# Patient Record
Sex: Female | Born: 1935 | Race: White | Hispanic: No | Smoking: Never smoker
Health system: Southern US, Community
[De-identification: ages and names within clinical notes are randomized; demographics above are authoritative.]

## PROBLEM LIST (undated history)

## (undated) DIAGNOSIS — Z87440 Personal history of urinary (tract) infections: Secondary | ICD-10-CM

## (undated) DIAGNOSIS — K5792 Diverticulitis of intestine, part unspecified, without perforation or abscess without bleeding: Secondary | ICD-10-CM

## (undated) DIAGNOSIS — M8580 Other specified disorders of bone density and structure, unspecified site: Secondary | ICD-10-CM

## (undated) DIAGNOSIS — R32 Unspecified urinary incontinence: Secondary | ICD-10-CM

## (undated) DIAGNOSIS — G629 Polyneuropathy, unspecified: Secondary | ICD-10-CM

## (undated) DIAGNOSIS — H409 Unspecified glaucoma: Secondary | ICD-10-CM

## (undated) DIAGNOSIS — K8020 Calculus of gallbladder without cholecystitis without obstruction: Secondary | ICD-10-CM

## (undated) DIAGNOSIS — E785 Hyperlipidemia, unspecified: Secondary | ICD-10-CM

## (undated) DIAGNOSIS — G4730 Sleep apnea, unspecified: Secondary | ICD-10-CM

## (undated) DIAGNOSIS — K449 Diaphragmatic hernia without obstruction or gangrene: Secondary | ICD-10-CM

## (undated) DIAGNOSIS — K219 Gastro-esophageal reflux disease without esophagitis: Secondary | ICD-10-CM

## (undated) DIAGNOSIS — K5790 Diverticulosis of intestine, part unspecified, without perforation or abscess without bleeding: Secondary | ICD-10-CM

## (undated) DIAGNOSIS — I700 Atherosclerosis of aorta: Secondary | ICD-10-CM

## (undated) DIAGNOSIS — M1990 Unspecified osteoarthritis, unspecified site: Secondary | ICD-10-CM

## (undated) DIAGNOSIS — B019 Varicella without complication: Secondary | ICD-10-CM

## (undated) HISTORY — DX: Calculus of gallbladder without cholecystitis without obstruction: K80.20

## (undated) HISTORY — DX: Unspecified glaucoma: H40.9

## (undated) HISTORY — DX: Gastro-esophageal reflux disease without esophagitis: K21.9

## (undated) HISTORY — PX: SIGMOIDOSCOPY: SUR1295

## (undated) HISTORY — DX: Diverticulitis of intestine, part unspecified, without perforation or abscess without bleeding: K57.92

## (undated) HISTORY — PX: APPENDECTOMY: SHX54

## (undated) HISTORY — DX: Other specified disorders of bone density and structure, unspecified site: M85.80

## (undated) HISTORY — PX: BREAST BIOPSY: SHX20

## (undated) HISTORY — DX: Atherosclerosis of aorta: I70.0

## (undated) HISTORY — DX: Varicella without complication: B01.9

## (undated) HISTORY — DX: Personal history of urinary (tract) infections: Z87.440

## (undated) HISTORY — PX: TONSILLECTOMY AND ADENOIDECTOMY: SHX28

## (undated) HISTORY — DX: Unspecified osteoarthritis, unspecified site: M19.90

## (undated) HISTORY — DX: Polyneuropathy, unspecified: G62.9

## (undated) HISTORY — DX: Diaphragmatic hernia without obstruction or gangrene: K44.9

## (undated) HISTORY — PX: LAPAROSCOPIC OOPHERECTOMY: SHX6507

## (undated) HISTORY — DX: Hyperlipidemia, unspecified: E78.5

## (undated) HISTORY — DX: Unspecified urinary incontinence: R32

## (undated) HISTORY — DX: Sleep apnea, unspecified: G47.30

## (undated) HISTORY — DX: Diverticulosis of intestine, part unspecified, without perforation or abscess without bleeding: K57.90

---

## 1965-12-05 HISTORY — PX: HEMORRHOID SURGERY: SHX153

## 1966-12-05 HISTORY — PX: RECTOCELE REPAIR: SHX761

## 1970-12-05 HISTORY — PX: DILATION AND CURETTAGE OF UTERUS: SHX78

## 1973-12-05 HISTORY — PX: BUNIONECTOMY: SHX129

## 1975-12-06 HISTORY — PX: ABDOMINAL HYSTERECTOMY: SHX81

## 1983-12-06 HISTORY — PX: ANAL FISTULECTOMY: SHX1139

## 1991-12-06 HISTORY — PX: VENTRAL HERNIA REPAIR: SHX424

## 1996-12-05 HISTORY — PX: LASIK: SHX215

## 2001-12-05 HISTORY — PX: FOOT NEUROMA SURGERY: SHX646

## 2001-12-05 HISTORY — PX: NEUROMA SURGERY: SHX722

## 2003-12-06 HISTORY — PX: HAMMER TOE SURGERY: SHX385

## 2007-12-06 HISTORY — PX: CATARACT EXTRACTION, BILATERAL: SHX1313

## 2008-01-06 HISTORY — PX: REPLACEMENT TOTAL KNEE: SUR1224

## 2008-08-05 HISTORY — PX: REPLACEMENT TOTAL KNEE: SUR1224

## 2008-12-05 DIAGNOSIS — H269 Unspecified cataract: Secondary | ICD-10-CM

## 2008-12-05 HISTORY — DX: Unspecified cataract: H26.9

## 2008-12-05 HISTORY — PX: KNEE ARTHROSCOPY: SUR90

## 2009-12-05 HISTORY — PX: OTHER SURGICAL HISTORY: SHX169

## 2012-07-14 HISTORY — PX: OTHER SURGICAL HISTORY: SHX169

## 2012-12-05 HISTORY — PX: ANKLE FRACTURE SURGERY: SHX122

## 2012-12-05 HISTORY — PX: HIP SURGERY: SHX245

## 2012-12-05 HISTORY — PX: OTHER SURGICAL HISTORY: SHX169

## 2013-10-09 HISTORY — PX: OTHER SURGICAL HISTORY: SHX169

## 2015-05-06 DIAGNOSIS — N63 Unspecified lump in unspecified breast: Secondary | ICD-10-CM | POA: Insufficient documentation

## 2016-03-08 ENCOUNTER — Encounter: Payer: Self-pay | Admitting: Gastroenterology

## 2016-06-14 DIAGNOSIS — R928 Other abnormal and inconclusive findings on diagnostic imaging of breast: Secondary | ICD-10-CM | POA: Insufficient documentation

## 2016-08-17 ENCOUNTER — Encounter: Payer: Self-pay | Admitting: Family Medicine

## 2016-08-17 DIAGNOSIS — M5010 Cervical disc disorder with radiculopathy, unspecified cervical region: Secondary | ICD-10-CM | POA: Insufficient documentation

## 2016-08-17 DIAGNOSIS — G5601 Carpal tunnel syndrome, right upper limb: Secondary | ICD-10-CM | POA: Insufficient documentation

## 2016-08-18 ENCOUNTER — Ambulatory Visit (INDEPENDENT_AMBULATORY_CARE_PROVIDER_SITE_OTHER): Payer: BLUE CROSS/BLUE SHIELD | Admitting: Family Medicine

## 2016-08-18 ENCOUNTER — Encounter: Payer: Self-pay | Admitting: Family Medicine

## 2016-08-18 VITALS — BP 128/90 | HR 71 | Resp 12 | Ht 64.5 in | Wt 150.1 lb

## 2016-08-18 DIAGNOSIS — Z0000 Encounter for general adult medical examination without abnormal findings: Secondary | ICD-10-CM

## 2016-08-18 DIAGNOSIS — L821 Other seborrheic keratosis: Secondary | ICD-10-CM

## 2016-08-18 DIAGNOSIS — M159 Polyosteoarthritis, unspecified: Secondary | ICD-10-CM | POA: Insufficient documentation

## 2016-08-18 DIAGNOSIS — M792 Neuralgia and neuritis, unspecified: Secondary | ICD-10-CM | POA: Insufficient documentation

## 2016-08-18 DIAGNOSIS — G629 Polyneuropathy, unspecified: Secondary | ICD-10-CM

## 2016-08-18 DIAGNOSIS — Z79899 Other long term (current) drug therapy: Secondary | ICD-10-CM | POA: Diagnosis not present

## 2016-08-18 DIAGNOSIS — E7800 Pure hypercholesterolemia, unspecified: Secondary | ICD-10-CM

## 2016-08-18 DIAGNOSIS — N3281 Overactive bladder: Secondary | ICD-10-CM

## 2016-08-18 DIAGNOSIS — E782 Mixed hyperlipidemia: Secondary | ICD-10-CM | POA: Insufficient documentation

## 2016-08-18 DIAGNOSIS — H409 Unspecified glaucoma: Secondary | ICD-10-CM | POA: Insufficient documentation

## 2016-08-18 DIAGNOSIS — Z23 Encounter for immunization: Secondary | ICD-10-CM | POA: Diagnosis not present

## 2016-08-18 LAB — BASIC METABOLIC PANEL
BUN: 17 mg/dL (ref 6–23)
CO2: 30 mEq/L (ref 19–32)
CREATININE: 0.82 mg/dL (ref 0.40–1.20)
Calcium: 9.1 mg/dL (ref 8.4–10.5)
Chloride: 105 mEq/L (ref 96–112)
GFR: 71.45 mL/min (ref >60.00)
Glucose, Bld: 82 mg/dL (ref 70–99)
Potassium: 4.3 mEq/L (ref 3.5–5.1)
Sodium: 141 mEq/L (ref 135–145)

## 2016-08-18 LAB — LIPID PANEL
CHOLESTEROL: 242 mg/dL — AB (ref 0–200)
HDL: 67.2 mg/dL (ref >39.00)
LDL Cholesterol: 136 mg/dL — ABNORMAL HIGH (ref 0–99)
NonHDL: 174.47
Total CHOL/HDL Ratio: 4
Triglycerides: 190 mg/dL — ABNORMAL HIGH (ref 0.0–149.0)
VLDL: 38 mg/dL (ref 0.0–40.0)

## 2016-08-18 MED ORDER — CELECOXIB 200 MG PO CAPS: 200.0000 mg | ORAL_CAPSULE | Freq: Every day | ORAL | 1 refills | Status: DC

## 2016-08-18 NOTE — Patient Instructions (Signed)
A few things to remember from today's visit:   Routine general medical examination at a health care facility  Pure hypercholesterolemia  Long-term use of high-risk medication  Generalized osteoarthritis of multiple sites - Plan: celecoxib (CELEBREX) 200 MG capsule  Peripheral neuropathic pain (HCC)  Overactive bladder - Plan: Ambulatory referral to Urology  Seborrheic keratoses - Plan: Ambulatory referral to Dermatology  Glaucoma - Plan: Ambulatory referral to Ophthalmology  Foot doctor referral usually is not needed, please let me know if you do. NSAID's as Celebrex increases the risk of cardiovascular disease as well as renal problems. Risk versus benefit evaluation suggest Anything from medication, we need to continue following regularly.  Referral to other providers were placed as requested.   A few tips:  -As we age balance is not as good as it was, so there is a higher risks for falls. Please remove small rugs and furniture that is "in your way" and could increase the risk of falls. Stretching exercises may help with fall prevention: Yoga and Tai Chi are some examples. Low impact exercise is better, so you are not very achy the next day.  -Sun screen and avoidance of direct sun light recommended. Caution with dehydration, if working outdoors be sure to drink enough fluids.  - Some medications are not safe as we age, increases the risk of side effects and can potentially interact with other medication you are also taken;  including some of over the counter medications. Be sure to let me know when you start a new medication even if it is a dietary/vitamin supplement.   -Healthy diet low in red meet/animal fat and sugar + regular physical activity is recommended.      Please be sure medication list is accurate. If a new problem present, please set up appointment sooner than planned today.
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
## 2016-08-23 ENCOUNTER — Telehealth: Payer: Self-pay | Admitting: Family Medicine

## 2016-08-23 DIAGNOSIS — M159 Polyosteoarthritis, unspecified: Secondary | ICD-10-CM

## 2016-08-23 MED ORDER — CELECOXIB 200 MG PO CAPS: 200.0000 mg | ORAL_CAPSULE | Freq: Every day | ORAL | 1 refills | Status: DC

## 2016-08-23 NOTE — Telephone Encounter (Signed)
Rx sent 

## 2016-08-23 NOTE — Telephone Encounter (Signed)
Pt needs new rx name brand only celebrex 200 mg #90 with [facility]ills [facility]

## 2017-01-02 ENCOUNTER — Encounter: Payer: Self-pay | Admitting: Family Medicine

## 2017-01-02 ENCOUNTER — Ambulatory Visit (INDEPENDENT_AMBULATORY_CARE_PROVIDER_SITE_OTHER): Payer: BLUE CROSS/BLUE SHIELD | Admitting: Family Medicine

## 2017-01-02 VITALS — BP 110/80 | HR 82 | Temp 98.2°F | Resp 12 | Ht 64.5 in | Wt 149.0 lb

## 2017-01-02 DIAGNOSIS — R05 Cough: Secondary | ICD-10-CM | POA: Diagnosis not present

## 2017-01-02 DIAGNOSIS — R059 Cough, unspecified: Secondary | ICD-10-CM

## 2017-01-02 DIAGNOSIS — J189 Pneumonia, unspecified organism: Secondary | ICD-10-CM

## 2017-01-02 DIAGNOSIS — R110 Nausea: Secondary | ICD-10-CM | POA: Diagnosis not present

## 2017-01-02 MED ORDER — DOXYCYCLINE HYCLATE 100 MG PO TABS: 100.0000 mg | ORAL_TABLET | Freq: Two times a day (BID) | ORAL | 0 refills | Status: AC

## 2017-01-02 NOTE — Progress Notes (Signed)
HPI:  ACUTE VISIT  Chief Complaint  Patient presents with  . flu like symptoms    Hailey Perkins is a 82 y.o.female here today with her daughter complaining of 2-3 days of respiratory symptoms.  Productive cough with some sputum, yellowish, denies hemoptysis. Mild nasal congestion, rhinorrhea, and post nasal drainage. She has not noted fever or chills. + Fatigue and body aches. She has not noted chest pain, dyspnea, or wheezing.  A weeks ago she also felt sick ,mainly nausea and "not feeling well." No respiratory symptoms then, no vomiting, diarrhea,or abdominal pain. Nausea resolved.  No Hx of recent overseas travel. She was in [place] last week. No sick contact. No known insect bite.  Hx of allergies: No  OTC medications for this problem: Nyquil and Dyquil   Symptoms getting worse.   Review of Systems  Constitutional: Positive for appetite change and fatigue. Negative for diaphoresis and fever.  HENT: Positive for congestion and postnasal drip. Negative for ear pain, mouth sores, sinus pressure, sore throat, trouble swallowing and voice change.   Respiratory: Positive for cough. Negative for shortness of breath and wheezing.   Cardiovascular: Negative for chest pain.  Gastrointestinal: Negative for abdominal pain, diarrhea and vomiting.  Genitourinary: Negative for decreased urine volume and dysuria.  Musculoskeletal: Positive for myalgias. Negative for gait problem.  Skin: Negative for rash.  Neurological: Negative for syncope, weakness and headaches.  Hematological: Negative for adenopathy. Does not bruise/bleed easily.  Psychiatric/Behavioral: Negative for confusion. The patient is not nervous/anxious.       Current Outpatient Prescriptions on File Prior to Visit  Medication Sig Dispense Refill  . Ascorbic Acid (VITAMIN C) 1000 MG tablet Take 1,000 mg by mouth daily.    . Biotin 16109 MCG TABS Take 1 tablet by mouth daily.    . celecoxib (CELEBREX)  200 MG capsule Take 1 capsule (200 mg total) by mouth daily. 90 capsule 1  . Cholecalciferol (VITAMIN D3) 2000 units TABS Take 1 tablet by mouth daily.    . dorzolamide (TRUSOPT) 2 % ophthalmic solution 1 drop 2 (two) times daily.    . DULoxetine (CYMBALTA) 20 MG capsule Take 20 mg by mouth daily.    Marland Kitchen Lifitegrast (XIIDRA) 5 % SOLN Apply 2 drops to eye daily.    . Magnesium 250 MG TABS Take 1 tablet by mouth daily.    . psyllium (METAMUCIL) 58.6 % packet Take 1 packet by mouth daily.    . solifenacin (VESICARE) 10 MG tablet Take 10 mg by mouth daily.     No current facility-administered medications on file prior to visit.      Past Medical History:  Diagnosis Date  . Arthritis   . Chicken pox   . GERD (gastroesophageal reflux disease)   . Glaucoma   . History of frequent urinary tract infections   . Hyperlipidemia   . Urine incontinence    Allergies  Allergen Reactions  . Protonix [Pantoprazole Sodium]     Social History   Social History  . Marital status: Widowed    Spouse name: N/A  . Number of children: N/A  . Years of education: N/A   Social History Main Topics  . Smoking status: Never Smoker  . Smokeless tobacco: Never Used  . Alcohol use No  . Drug use: No  . Sexual activity: No   Other Topics Concern  . None   Social History Narrative  . None    Vitals:   01/02/17  1603  BP: 110/80  Pulse: 82  Resp: 12  Temp: 98.2 F (36.8 C)   O2 sat 96% at RA.  Body mass index is 25.18 kg/m.   Physical Exam  Nursing note and vitals reviewed. Constitutional: She is oriented to person, place, and time. She appears well-developed and well-nourished. She does not appear ill. No distress.  HENT:  Head: Atraumatic.  Nose: Rhinorrhea present.  Mouth/Throat: Uvula is midline, oropharynx is clear and moist and mucous membranes are normal.  Eyes: Conjunctivae are normal.  Cardiovascular: Normal rate and regular rhythm.   No murmur heard. Respiratory: Effort  normal. No stridor. No respiratory distress. She has rales (fine rales rigth base).  Lymphadenopathy:    She has no cervical adenopathy.  Neurological: She is alert and oriented to person, place, and time. She has normal strength.  Skin: Skin is warm. No rash noted. No erythema.  Psychiatric: She has a normal mood and affect. Her speech is normal.  Well groomed, good eye contact.      ASSESSMENT AND PLAN:     Hailey Perkins was seen today for flu like symptoms.  Diagnoses and all orders for this visit:    Cough -     DG Chest 2 View; Future  Pneumonia, unspecified organism  Today rapid flu was negative. Found on auscultation fine rales right base, so I will treat as CAP with Doxycycline. CBC and CXR were ordered. Plain Mucinex OTC and adequate fluid intake + rest recommended.  She was instructed about warning signs. F/U in 10 days if needed.   -     CBC w/Diff  Nausea without vomiting  Resolved. It seemed like an isolated issue, at this time I do not think it is related to respiratory symptoms. Adequate hydration. F/U as needed.  Other orders -     doxycycline (VIBRA-TABS) 100 MG tablet; Take 1 tablet (100 mg total) by mouth 2 (two) times daily.      -Hailey Perkins was advised to return or notify a doctor immediately if symptoms worsen or persist or new concerns arise, she voices understanding.       Laurisa Sahakian G. [place], MD  [facility]. [place] office.

## 2017-01-02 NOTE — Progress Notes (Signed)
Pre visit review using our clinic review tool, if applicable. No additional management support is needed unless otherwise documented below in the visit note. 

## 2017-01-02 NOTE — Patient Instructions (Addendum)
A few things to remember from today's visit:   Pneumonia, unspecified organism - Plan: CBC w/Diff  Cough - Plan: DG Chest 2 View  Monitor for sudden worsening symptoms, shortness of breath, chest pain.  Please be sure medication list is accurate. If a new problem present, please set up appointment sooner than planned today.

## 2017-01-03 ENCOUNTER — Ambulatory Visit (INDEPENDENT_AMBULATORY_CARE_PROVIDER_SITE_OTHER)
Admission: RE | Admit: 2017-01-03 | Discharge: 2017-01-03 | Disposition: A | Discharge status: Home / Self Care | Payer: BLUE CROSS/BLUE SHIELD | Source: Ambulatory Visit | Attending: Family Medicine | Admitting: Family Medicine

## 2017-01-03 DIAGNOSIS — R059 Cough, unspecified: Secondary | ICD-10-CM

## 2017-01-03 DIAGNOSIS — R05 Cough: Secondary | ICD-10-CM | POA: Diagnosis not present

## 2017-01-03 LAB — CBC WITH DIFFERENTIAL/PLATELET
BASOS ABS: 0 10*3/uL (ref 0.0–0.1)
Basophils Relative: 0.6 % (ref 0.0–3.0)
EOS ABS: 0.1 10*3/uL (ref 0.0–0.7)
EOS PCT: 2.5 % (ref 0.0–5.0)
HCT: 38.5 % (ref 36.0–46.0)
HEMOGLOBIN: 13 g/dL (ref 12.0–15.0)
Lymphocytes Relative: 21.6 % (ref 12.0–46.0)
Lymphs Abs: 1.1 10*3/uL (ref 0.7–4.0)
MCHC: 33.6 g/dL (ref 30.0–36.0)
MCV: 91.9 fl (ref 78.0–100.0)
MONO ABS: 0.6 10*3/uL (ref 0.1–1.0)
Monocytes Relative: 12.8 % — ABNORMAL HIGH (ref 3.0–12.0)
Neutro Abs: 3.2 10*3/uL (ref 1.4–7.7)
Neutrophils Relative %: 62.5 % (ref 43.0–77.0)
Platelets: 170 10*3/uL (ref 150.0–400.0)
RBC: 4.19 Mil/uL (ref 3.87–5.11)
RDW: 13.2 % (ref 11.5–15.5)
WBC: 5 10*3/uL (ref 4.0–10.5)

## 2017-01-03 IMAGING — DX DG CHEST 2V
2 series · 2 of 2 positions shown · non-contrast
Comparison: None.

CLINICAL DATA: Productive cough.

EXAM:
CHEST  2 VIEW

[chest pa]
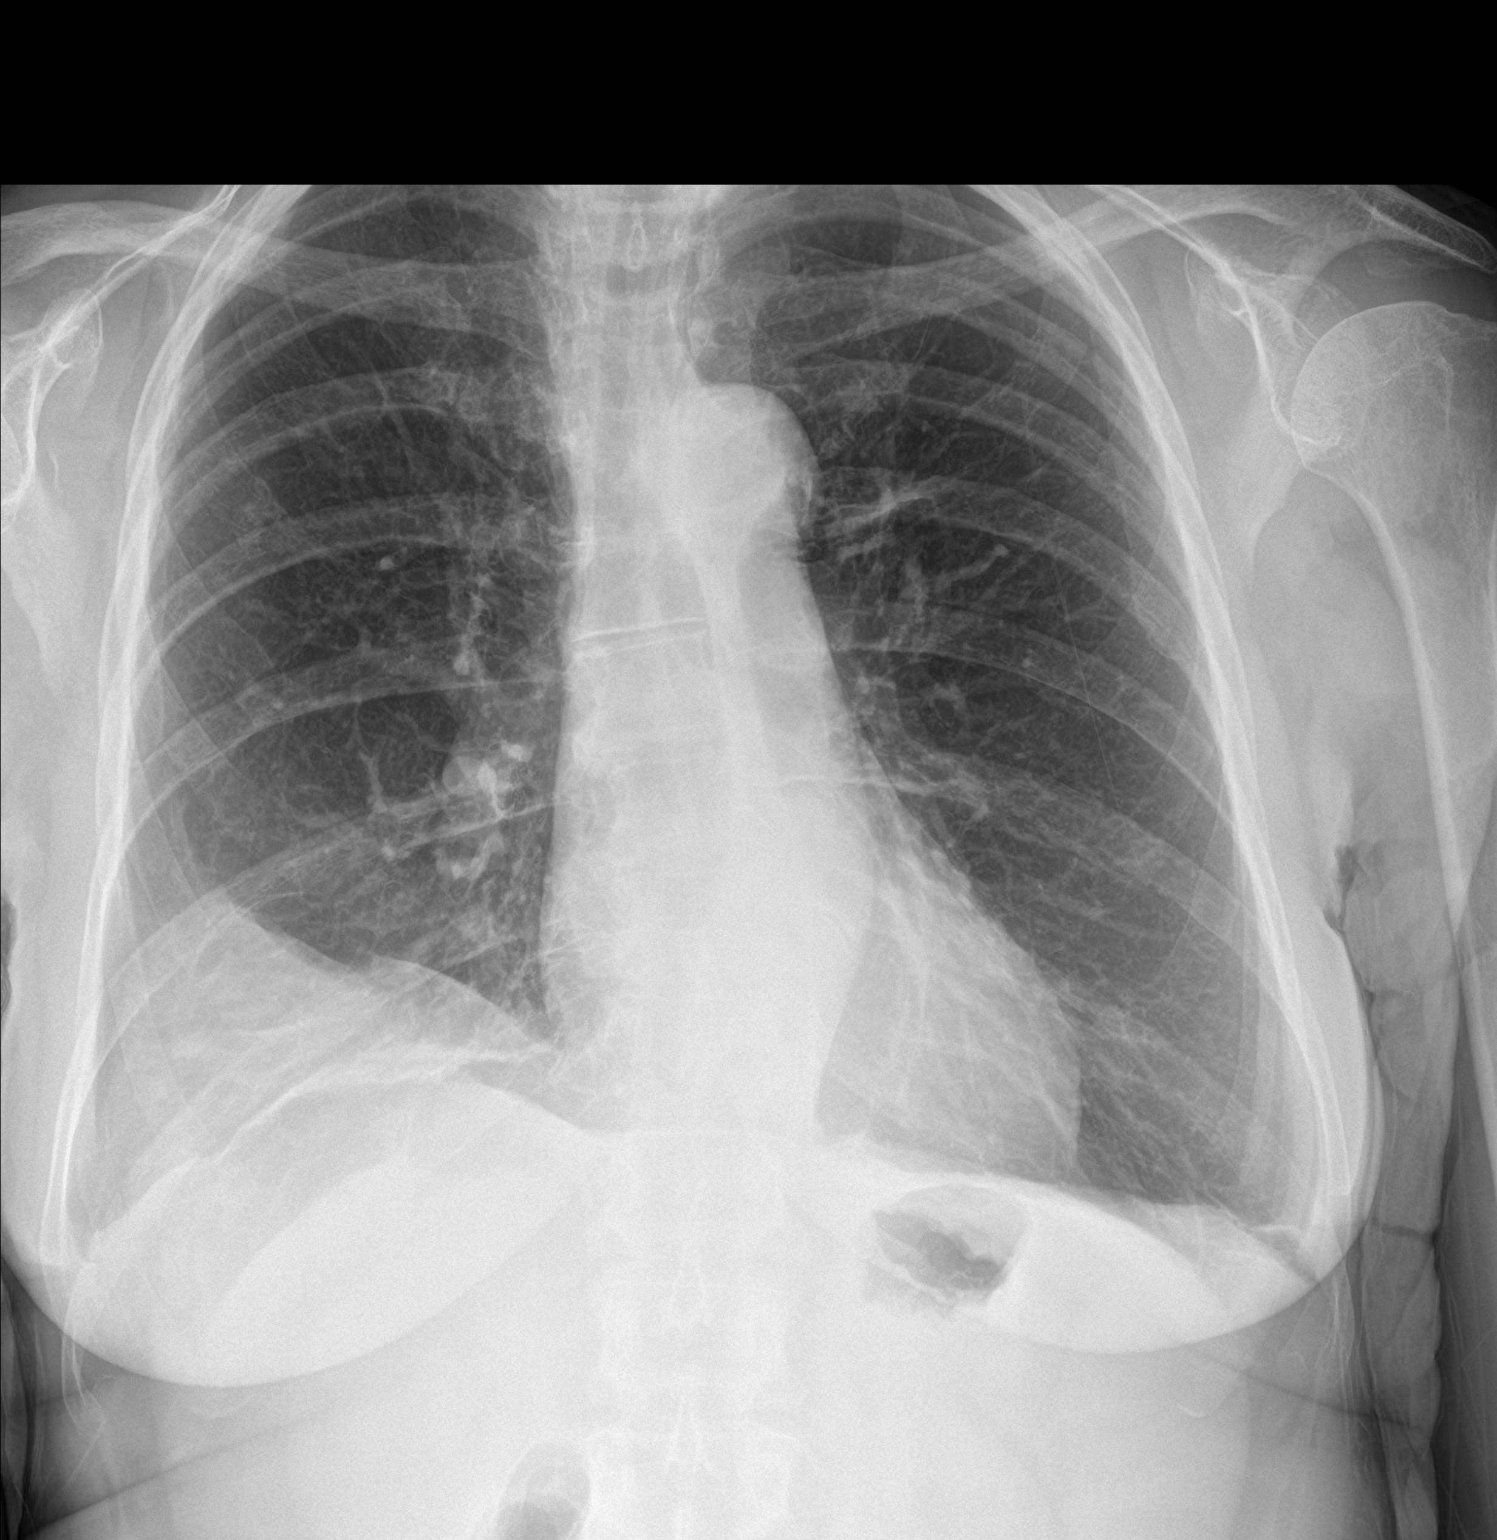

[chest lat]
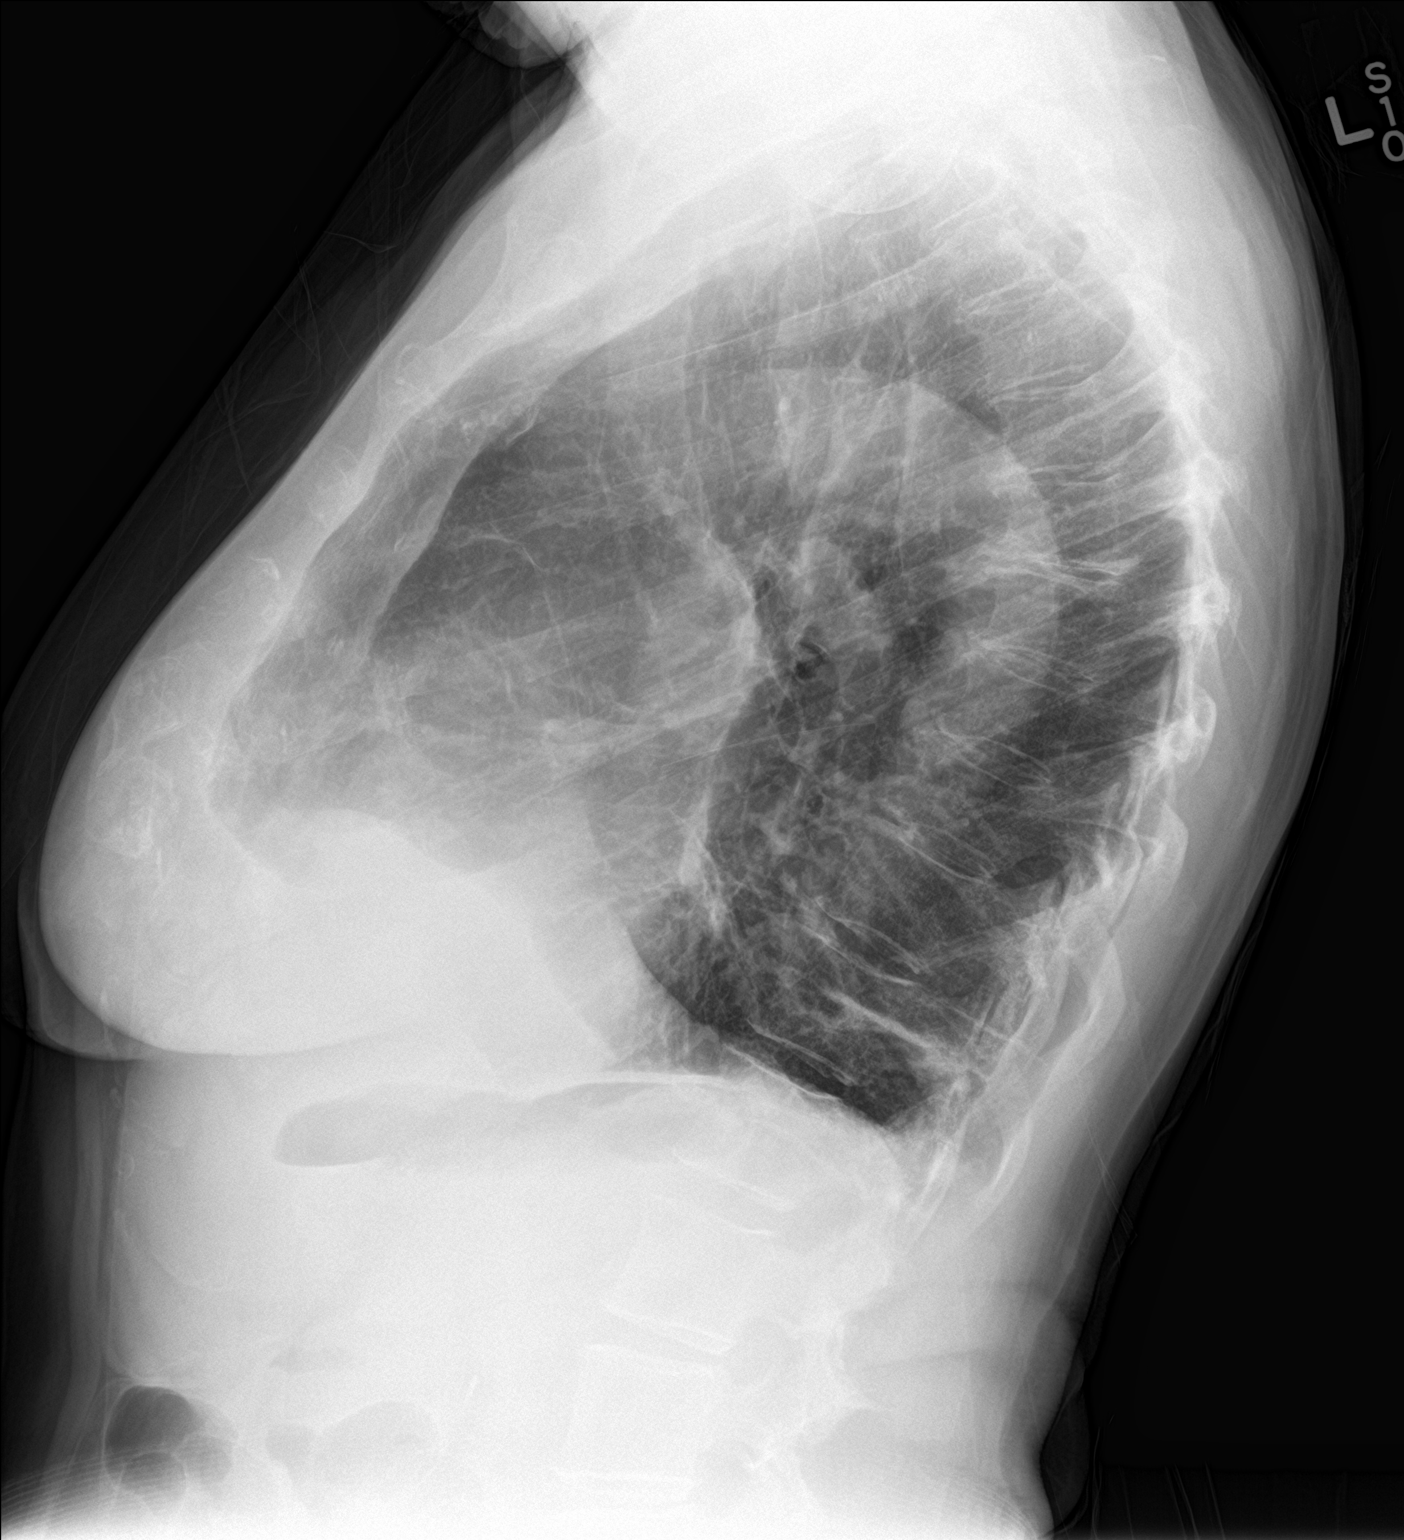

[2 of 2 positions shown; findings below may reference images not displayed]

FINDINGS: The heart size and mediastinal contours are within normal limits.
Atherosclerosis of thoracic aorta is noted. No pneumothorax is
noted. Minimal left basilar subsegmental atelectasis or scarring is
noted. Right basilar opacity is noted concerning for atelectasis or
possibly infiltrate. The visualized skeletal structures are
unremarkable.
IMPRESSION: Right basilar opacity concerning for atelectasis or infiltrate.
Followup PA and lateral chest X-ray is recommended in 3-4 weeks
following trial of antibiotic therapy to ensure resolution and
exclude underlying malignancy.

## 2017-01-04 ENCOUNTER — Telehealth: Payer: Self-pay | Admitting: Family Medicine

## 2017-01-04 NOTE — Telephone Encounter (Signed)
Pt is calling sara back and requesting xray result

## 2017-01-04 NOTE — Telephone Encounter (Signed)
Informed patient of results and patient verbalized understanding.  

## 2017-01-23 ENCOUNTER — Ambulatory Visit (INDEPENDENT_AMBULATORY_CARE_PROVIDER_SITE_OTHER): Payer: BLUE CROSS/BLUE SHIELD | Admitting: Family Medicine

## 2017-01-23 ENCOUNTER — Encounter: Payer: Self-pay | Admitting: Family Medicine

## 2017-01-23 VITALS — BP 110/80 | HR 72 | Resp 12 | Ht 64.5 in | Wt 146.2 lb

## 2017-01-23 DIAGNOSIS — M159 Polyosteoarthritis, unspecified: Secondary | ICD-10-CM | POA: Diagnosis not present

## 2017-01-23 DIAGNOSIS — M792 Neuralgia and neuritis, unspecified: Secondary | ICD-10-CM | POA: Diagnosis not present

## 2017-01-23 DIAGNOSIS — R938 Abnormal findings on diagnostic imaging of other specified body structures: Secondary | ICD-10-CM

## 2017-01-23 DIAGNOSIS — R9389 Abnormal findings on diagnostic imaging of other specified body structures: Secondary | ICD-10-CM

## 2017-01-23 NOTE — Patient Instructions (Signed)
A few things to remember from today's visit:   Generalized osteoarthritis of multiple sites  Peripheral neuropathic pain  Abnormal chest x-ray - Plan: DG Chest 2 View    [name], today we have followed on some of your chronic medical problems and they seem to be stable, so no changes in current management today.  Review medication list and be sure it is accurate.  -Remember a healthy diet and regular physical activity are very important for prevention as well as for well being; they also help with many chronic problems, decreasing the need of adding new medications and delaying or preventing possible complications.  I will see you back in 6-7 months.  CXR in 1-2 weeks.  Remember to arrange your follow up appt before leaving today.  Please follow sooner than planned if a new concern arises.

## 2017-01-23 NOTE — Progress Notes (Signed)
Pre visit review using our clinic review tool, if applicable. No additional management support is needed unless otherwise documented below in the visit note. 

## 2017-01-23 NOTE — Progress Notes (Signed)
HPI:   [name] is a 82 y.o. female, who is here today to follow on some chronic medical problems. I last saw her 01/02/17 for cough.  CXR 01/03/17 showed right basilar opacities concerning for atelectasis or infiltrates. 3-4 weeks f/u was recommended. She has not noted fever,chills, chest pain, dyspnea, or wheezing. Cough is occasional and non productive.    Hx of Generalized OA: Arthralgia and lower back pain. Tolerating Celebrex well and still heping with joint pain and back pain. She has followed with rheumatologists. No limitation in her daily activities, while taking medication pain is mild.  S/P back surgery and right TKR.  Pain usually exacerbated by prolonged walking and standing. + Stiffness. Denies saddle anesthesia or changes in bowel/urine continence.   Peripheral neuropathy: Unknown etiology, she has followed with neurologists. She is on Cymbalta 20 mg, which has helped with symptoms. No side effects reported.   Lab Results  Component Value Date   CREATININE 0.82 08/18/2016   BUN 17 08/18/2016   NA 141 08/18/2016   K 4.3 08/18/2016   CL 105 08/18/2016   CO2 30 08/18/2016     No new concerns today.   Review of Systems  Constitutional: Negative for activity change, appetite change, fatigue and fever.  HENT: Negative for mouth sores, nosebleeds and trouble swallowing.   Respiratory: Positive for cough. Negative for shortness of breath and wheezing.   Cardiovascular: Negative for chest pain, palpitations and leg swelling.  Gastrointestinal: Negative for abdominal pain, nausea and vomiting.       Negative for changes in bowel habits.  Genitourinary: Negative for decreased urine volume and hematuria.  Musculoskeletal: Positive for arthralgias and back pain. Negative for gait problem and joint swelling.  Neurological: Negative for syncope, weakness and headaches.  Psychiatric/Behavioral: Negative for confusion.      Current Outpatient  Prescriptions on File Prior to Visit  Medication Sig Dispense Refill  . Ascorbic Acid (VITAMIN C) 1000 MG tablet Take 1,000 mg by mouth daily.    . Biotin 9604510000 MCG TABS Take 1 tablet by mouth daily.    . Cholecalciferol (VITAMIN D3) 2000 units TABS Take 1 tablet by mouth daily.    . dorzolamide (TRUSOPT) 2 % ophthalmic solution 1 drop 2 (two) times daily.    . DULoxetine (CYMBALTA) 20 MG capsule Take 20 mg by mouth daily.    Marland Kitchen. Lifitegrast (XIIDRA) 5 % SOLN Apply 2 drops to eye daily.    . Magnesium 250 MG TABS Take 1 tablet by mouth daily.    . psyllium (METAMUCIL) 58.6 % packet Take 1 packet by mouth daily.    . solifenacin (VESICARE) 10 MG tablet Take 10 mg by mouth daily.     No current facility-administered medications on file prior to visit.      Past Medical History:  Diagnosis Date  . Arthritis   . Chicken pox   . GERD (gastroesophageal reflux disease)   . Glaucoma   . History of frequent urinary tract infections   . Hyperlipidemia   . Urine incontinence    Allergies  Allergen Reactions  . Protonix [Pantoprazole Sodium]     Social History   Social History  . Marital status: Widowed    Spouse name: N/A  . Number of children: N/A  . Years of education: N/A   Social History Main Topics  . Smoking status: Never Smoker  . Smokeless tobacco: Never Used  . Alcohol use No  . Drug use:  No  . Sexual activity: No   Other Topics Concern  . None   Social History Narrative  . None    Vitals:   01/23/17 0832  BP: 110/80  Pulse: 72  Resp: 12   Body mass index is 24.72 kg/m.   Physical Exam  Nursing note and vitals reviewed. Constitutional: She is oriented to person, place, and time. She appears well-developed and well-nourished. No distress.  HENT:  Head: Atraumatic.  Mouth/Throat: Oropharynx is clear and moist and mucous membranes are normal.  Eyes: Conjunctivae and EOM are normal.  Cardiovascular: Normal rate and regular rhythm.   No murmur  heard. Pulses:      Dorsalis pedis pulses are 2+ on the right side, and 2+ on the left side.  Respiratory: Effort normal and breath sounds normal. No respiratory distress.  GI: Soft. She exhibits no mass. There is no hepatomegaly. There is no tenderness.  Musculoskeletal: She exhibits no edema or tenderness.  No tenderness upon palpation of paraspinal muscles. Hips and knees movement do not elicit pain. No significant limitation of ROM. No signs of synovitis.   Lymphadenopathy:    She has no cervical adenopathy.  Neurological: She is alert and oriented to person, place, and time. She has normal strength. Coordination normal.  Stable gait with no assistance.  Skin: Skin is warm. No erythema.  Psychiatric: She has a normal mood and affect.  Well groomed, good eye contact.      ASSESSMENT AND PLAN:   Hailey Perkins was seen today for follow-up.  Diagnoses and all orders for this visit:  Generalized osteoarthritis of multiple sites  Stable, pain otherwise well controlled. No changes in current management. We reviewed side effects of chronic use of NSAID's. Renal function normal 08/2016. F/U in 6 months.  -     celecoxib (CELEBREX) 200 MG capsule; Take 1 capsule (200 mg total) by mouth daily.  Peripheral neuropathic pain  Stable. No changes in current management. Some side effects discussed.  Abnormal chest x-ray  Clinically respiratory infection resolved, lung auscultation normal. In 1-2 weeks CXR to be repeated, further recommendations will be given accordingly.  -     DG Chest 2 View; Future         -Ms. Hailey Perkins was advised to return sooner than planned today if new concerns arise.       Betty G. [place], MD  [facility]. [place] office.

## 2017-01-24 MED ORDER — CELECOXIB 200 MG PO CAPS: 200.0000 mg | ORAL_CAPSULE | Freq: Every day | ORAL | 1 refills | Status: DC

## 2017-01-24 MED ORDER — DULOXETINE HCL 20 MG PO CPEP: 20.0000 mg | ORAL_CAPSULE | Freq: Every day | ORAL | 1 refills | Status: DC

## 2017-01-25 IMAGING — US US CAROTID DUPLEX BILAT
1 series · 13 of 24 positions shown · non-contrast
Comparison: None.

CLINICAL DATA: Macular degeneration bilaterally.

EXAM:
BILATERAL CAROTID DUPLEX ULTRASOUND
TECHNIQUE: Gray scale imaging, color Doppler and duplex ultrasound was
performed of bilateral carotid and vertebral arteries in the neck.
TECHNIQUE: Quantification of carotid stenosis is based on velocity parameters
that correlate the residual internal carotid diameter with
NASCET-based stenosis levels, using the diameter of the distal
internal carotid lumen as the denominator for stenosis measurement.

[Series 1: us carotid duplex bilat · 0.06mm/px · 13 of 51 slices shown]
[im 1/51]
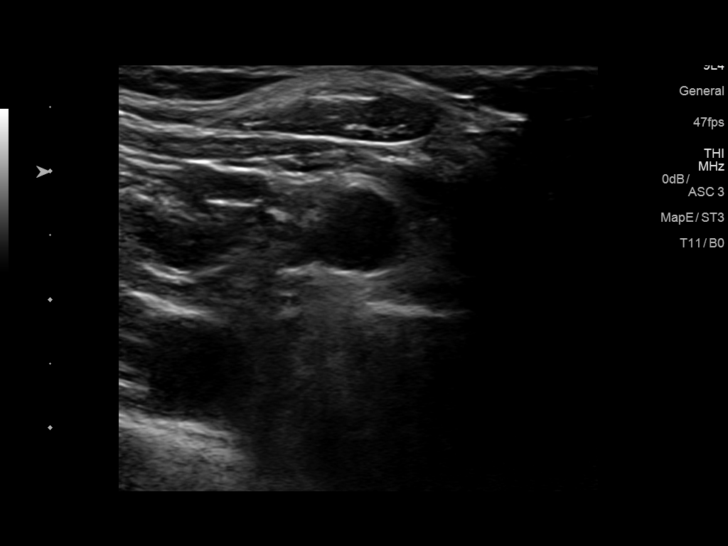
[im 5/51]
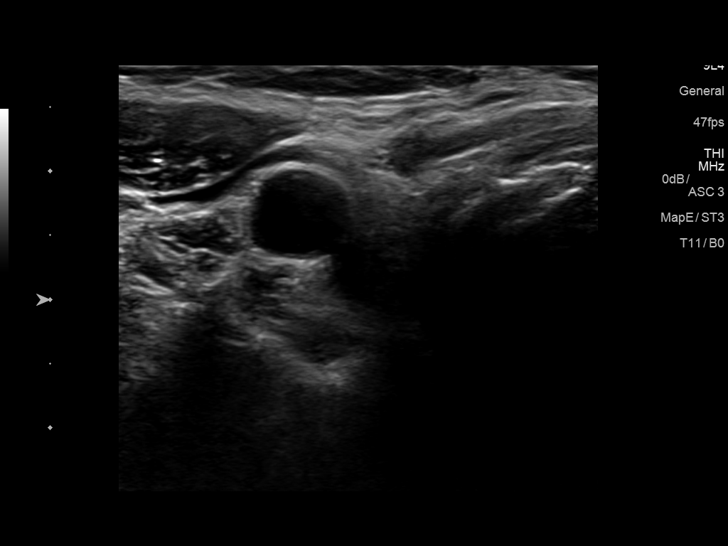
[im 9/51]
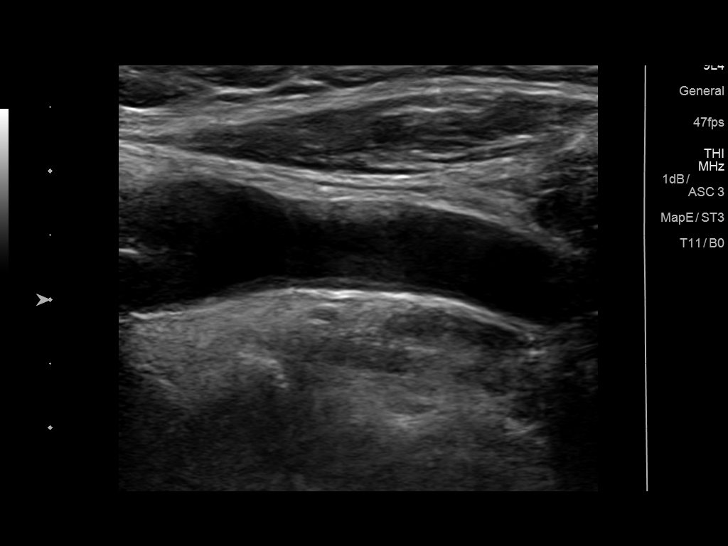
[im 14/51]
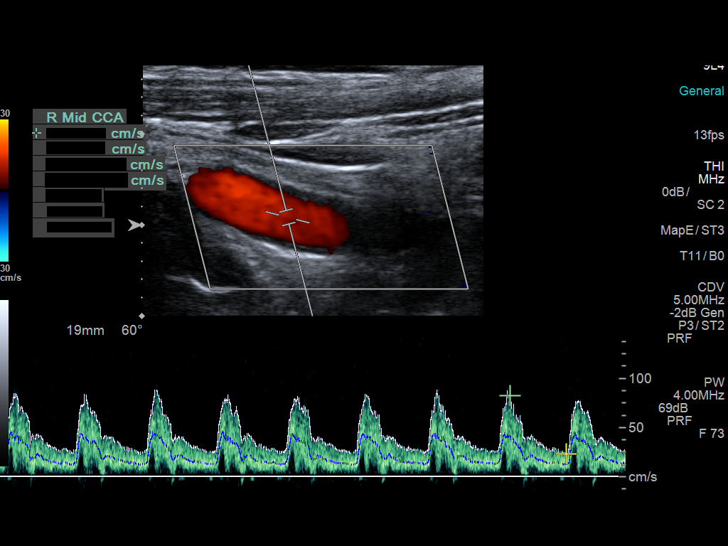
[im 18/51]
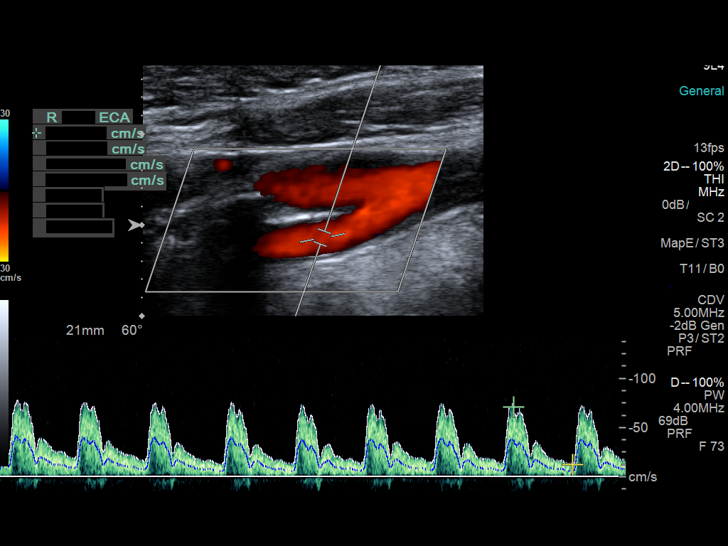
[im 22/51]
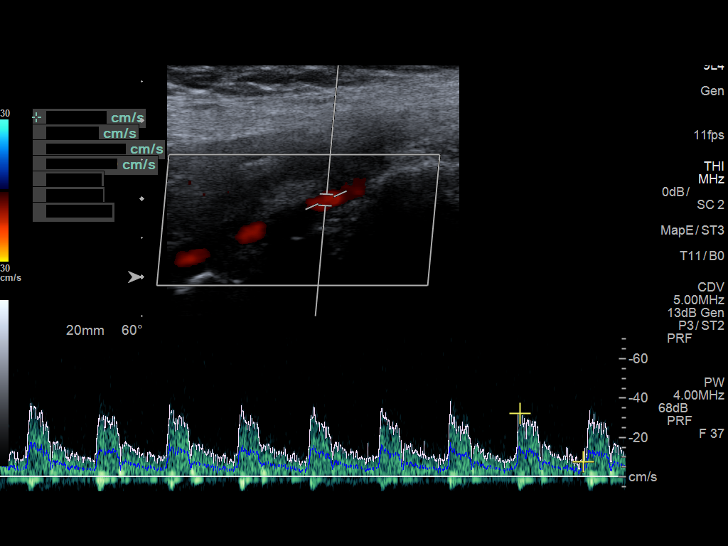
[im 27/51]
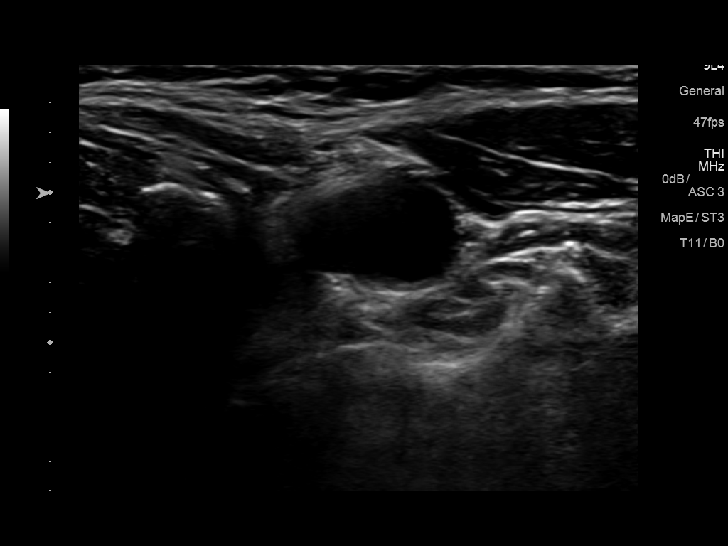
[im 29/51]
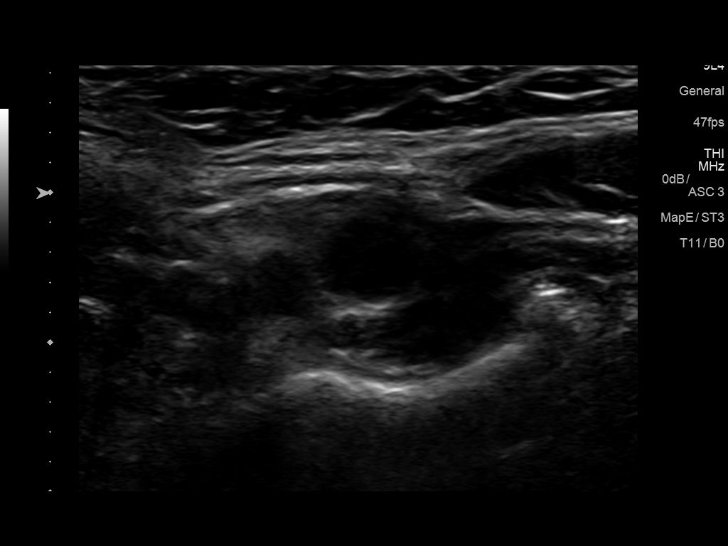
[im 33/51]
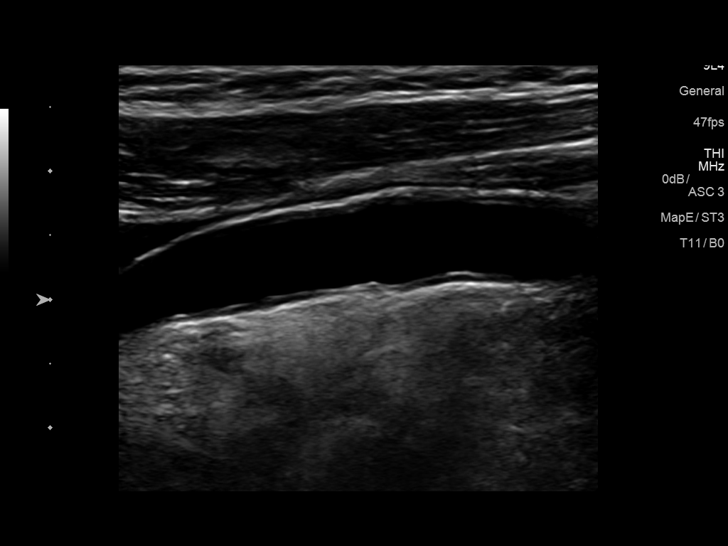
[im 37/51]
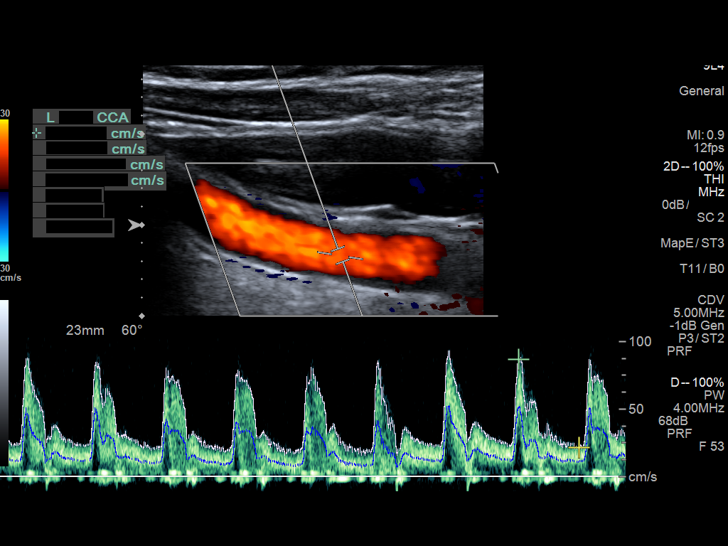
[im 42/51]
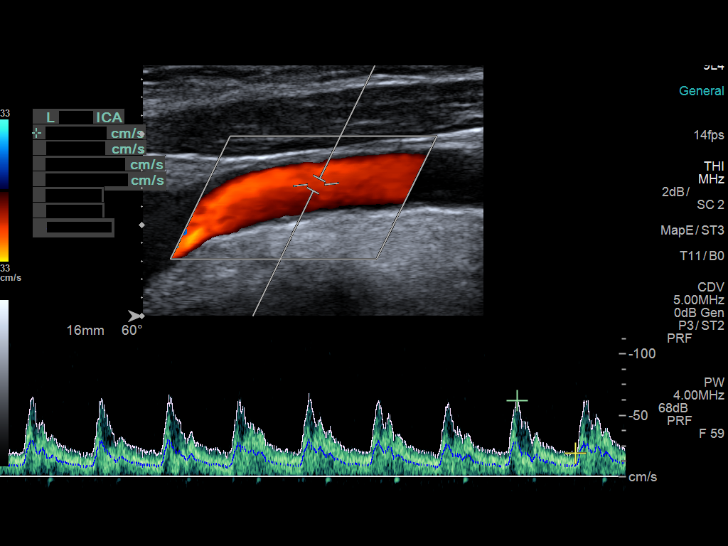
[im 46/51]
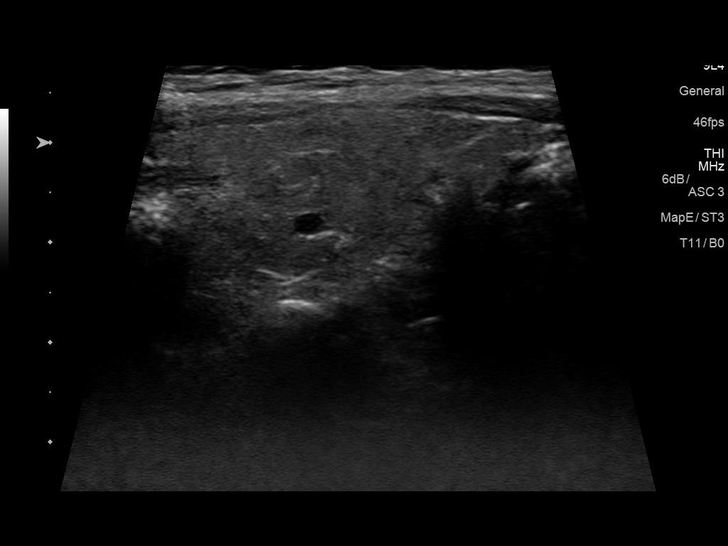
[im 51/51]
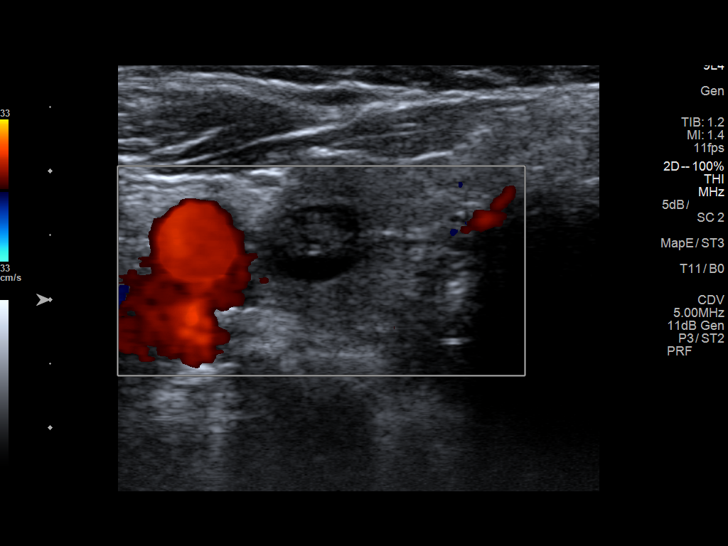

[13 of 24 positions shown; findings below may reference images not displayed]

The following velocity measurements were obtained:

PEAK SYSTOLIC/END DIASTOLIC

RIGHT

ICA:                     76/33cm/sec

CCA:                     100/26cm/sec

SYSTOLIC ICA/CCA RATIO:

DIASTOLIC ICA/CCA RATIO:

ECA:                     71cm/sec

LEFT

ICA:                     81/36cm/sec

CCA:                     89/20cm/sec

SYSTOLIC ICA/CCA RATIO:

DIASTOLIC ICA/CCA RATIO:

ECA:                     79cm/sec
FINDINGS: RIGHT CAROTID ARTERY: Mild smooth plaque in the bulb. No significant
stenosis. Normal waveforms and color Doppler signal.

RIGHT VERTEBRAL ARTERY:  Normal flow direction and waveform.

LEFT CAROTID ARTERY: Very mild smooth plaque in the bulb. No
significant stenosis. Normal waveforms and color Doppler signal.
Mild tortuosity of the carotid system.

LEFT VERTEBRAL ARTERY: Normal flow direction and waveform.

Small cystic thyroid lesions noted bilaterally.
IMPRESSION: 1. Early bilateral carotid bifurcation plaque resulting in less than
50% diameter stenosis.
2.  Antegrade bilateral vertebral arterial flow.

## 2017-02-27 ENCOUNTER — Encounter: Payer: Self-pay | Admitting: Family Medicine

## 2017-02-27 ENCOUNTER — Ambulatory Visit (INDEPENDENT_AMBULATORY_CARE_PROVIDER_SITE_OTHER)
Admission: RE | Admit: 2017-02-27 | Discharge: 2017-02-27 | Disposition: A | Discharge status: Home / Self Care | Payer: BLUE CROSS/BLUE SHIELD | Source: Ambulatory Visit | Attending: Family Medicine | Admitting: Family Medicine

## 2017-02-27 DIAGNOSIS — R938 Abnormal findings on diagnostic imaging of other specified body structures: Secondary | ICD-10-CM | POA: Diagnosis not present

## 2017-02-27 DIAGNOSIS — R9389 Abnormal findings on diagnostic imaging of other specified body structures: Secondary | ICD-10-CM

## 2017-02-27 IMAGING — DX DG CHEST 2V
2 series · 2 of 2 positions shown · non-contrast
Comparison: PA and lateral chest x-ray [DATE].

CLINICAL DATA: Follow-up abnormal chest x-ray [DATE].
The patient is currently asymptomatic.

EXAM:
CHEST  2 VIEW

[chest pa]
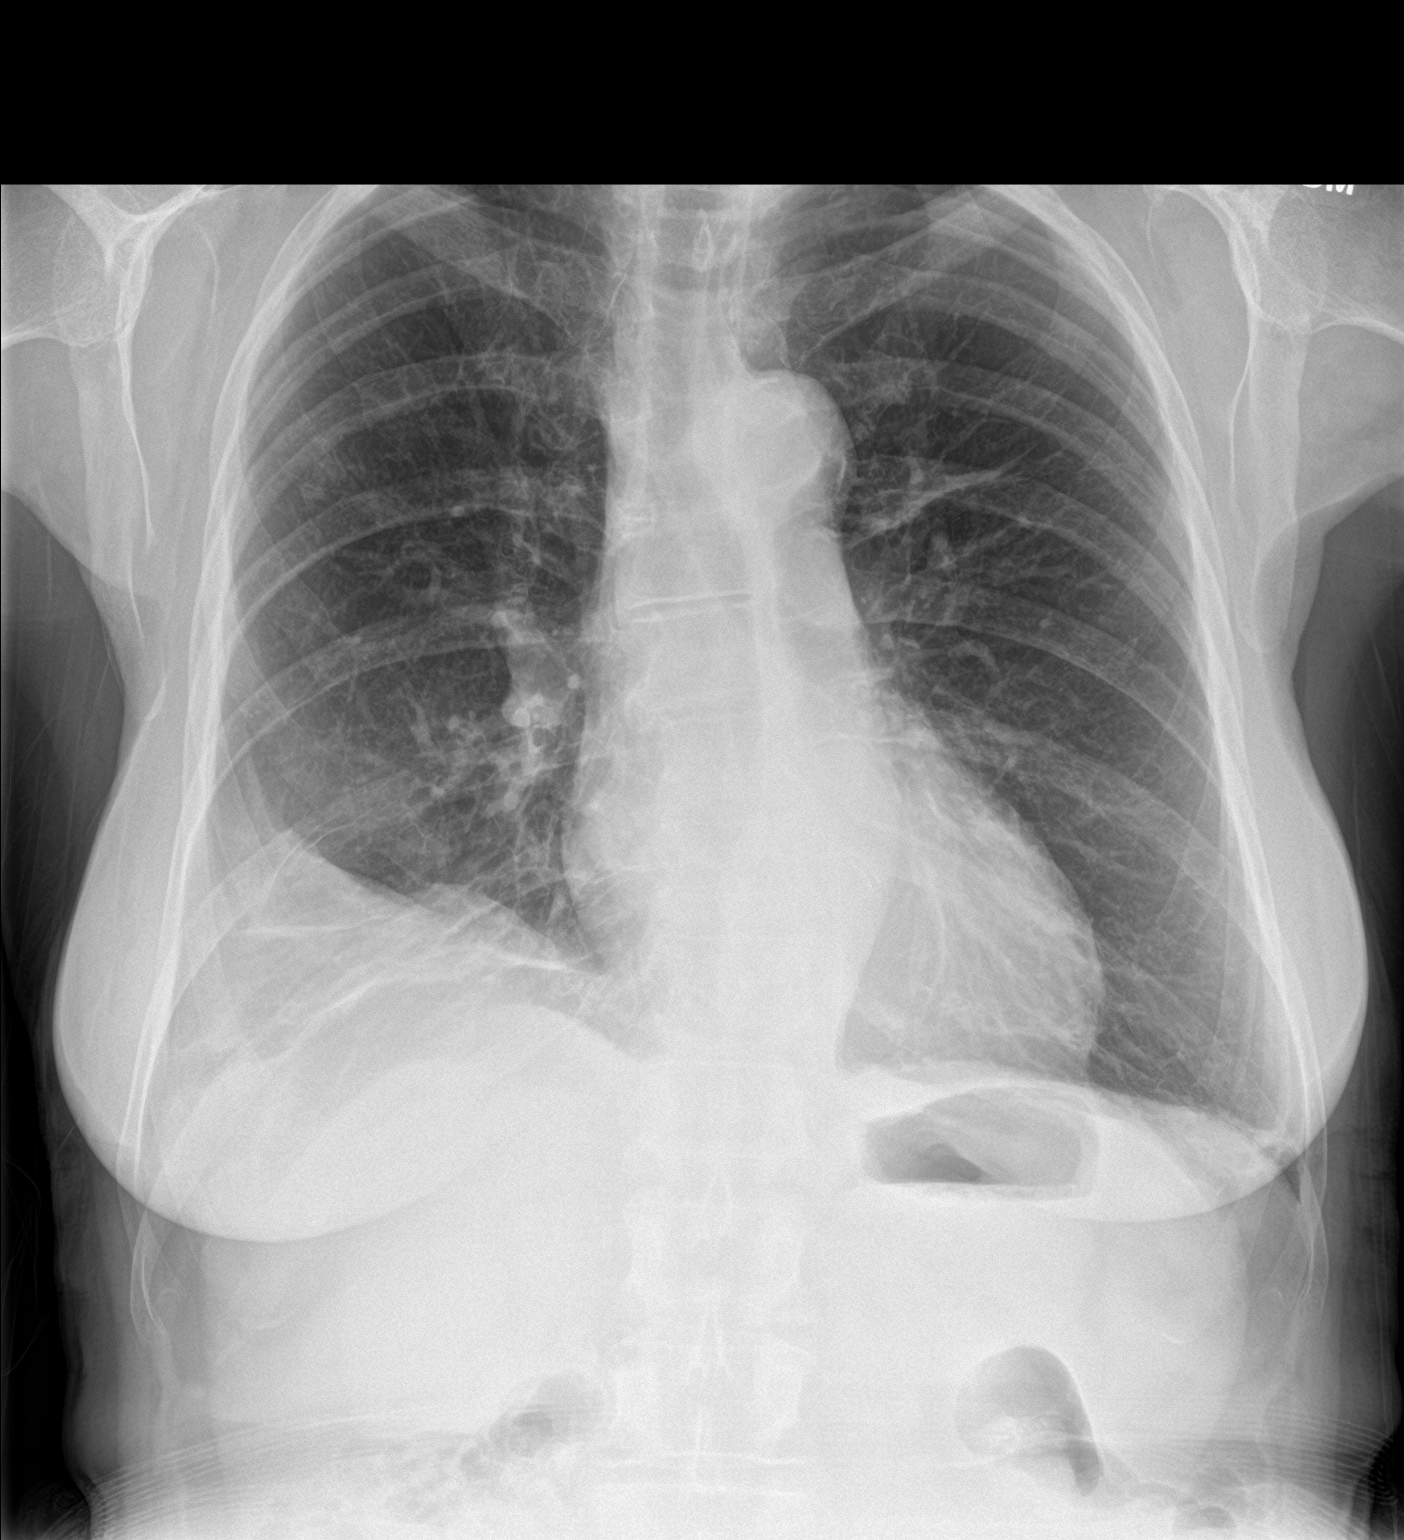

[chest lat]
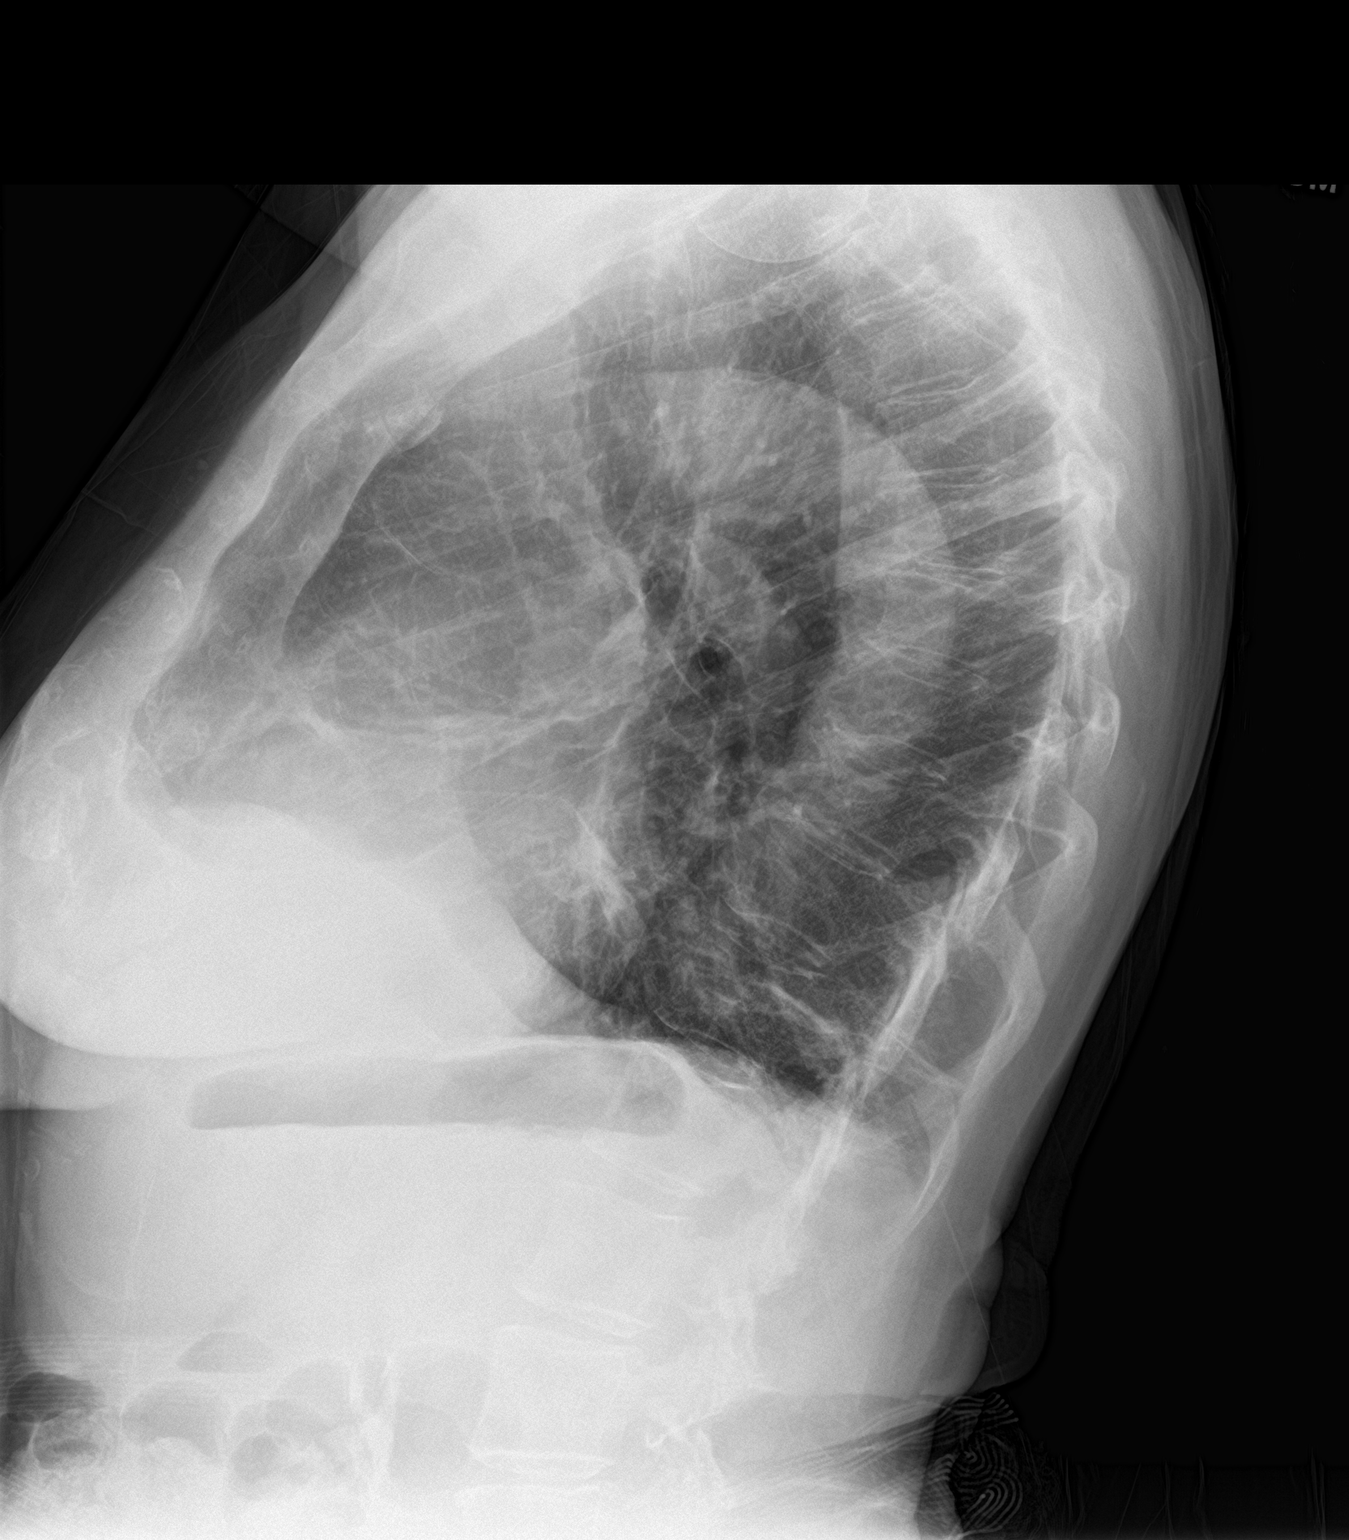

[2 of 2 positions shown; findings below may reference images not displayed]

FINDINGS: The left lung is well-expanded. There is minimal scarring at the
left lung base. On the right there is persistent volume loss
anteriorly with some scarring posteriorly. No new abnormalities
within the walls are observed. The heart and pulmonary vascularity
are normal. The mediastinum is normal in width. There is
calcification in the wall of the aortic arch. The bony thorax
exhibits no acute abnormality.
IMPRESSION: Stable appearance of the chest with persistent volume loss at the
right lung base and minimal scarring at the left lung base. No CHF
nor pneumonia. Given that there are no chest x-rays prior to [DATE] available for review, further evaluation of the lung
parenchyma and pleural surfaces with contrast enhanced CT scanning
would be useful.

Thoracic aortic atherosclerosis.

## 2017-03-01 ENCOUNTER — Telehealth: Payer: Self-pay | Admitting: Family Medicine

## 2017-03-01 DIAGNOSIS — E2839 Other primary ovarian failure: Secondary | ICD-10-CM

## 2017-03-01 NOTE — Telephone Encounter (Signed)
Pt would like an order for a bone density. Ok to schedule? Pt [place] had one in [place], [place].

## 2017-03-02 NOTE — Telephone Encounter (Signed)
Okay to order?

## 2017-03-02 NOTE — Telephone Encounter (Signed)
Ok to have DEXA ordered, Dx estrogen deficiency state.  Thanks, BJ

## 2017-03-02 NOTE — Telephone Encounter (Signed)
Order placed

## 2017-03-07 ENCOUNTER — Other Ambulatory Visit: Payer: Self-pay

## 2017-03-07 DIAGNOSIS — R9389 Abnormal findings on diagnostic imaging of other specified body structures: Secondary | ICD-10-CM

## 2017-03-07 DIAGNOSIS — Z01812 Encounter for preprocedural laboratory examination: Secondary | ICD-10-CM

## 2017-03-07 NOTE — Telephone Encounter (Signed)
Pt has been scheduled.  °

## 2017-03-08 ENCOUNTER — Other Ambulatory Visit (INDEPENDENT_AMBULATORY_CARE_PROVIDER_SITE_OTHER): Payer: BLUE CROSS/BLUE SHIELD

## 2017-03-08 DIAGNOSIS — Z01812 Encounter for preprocedural laboratory examination: Secondary | ICD-10-CM

## 2017-03-08 LAB — CREATININE, SERUM: Creatinine, Ser: 0.78 mg/dL (ref 0.40–1.20)

## 2017-03-08 LAB — BUN: BUN: 17 mg/dL (ref 6–23)

## 2017-03-10 ENCOUNTER — Ambulatory Visit (INDEPENDENT_AMBULATORY_CARE_PROVIDER_SITE_OTHER)
Admission: RE | Admit: 2017-03-10 | Discharge: 2017-03-10 | Disposition: A | Discharge status: Home / Self Care | Payer: BLUE CROSS/BLUE SHIELD | Source: Ambulatory Visit | Attending: Family Medicine | Admitting: Family Medicine

## 2017-03-10 DIAGNOSIS — E2839 Other primary ovarian failure: Secondary | ICD-10-CM

## 2017-03-12 DIAGNOSIS — E2839 Other primary ovarian failure: Secondary | ICD-10-CM | POA: Diagnosis not present

## 2017-03-17 ENCOUNTER — Ambulatory Visit (INDEPENDENT_AMBULATORY_CARE_PROVIDER_SITE_OTHER)
Admission: RE | Admit: 2017-03-17 | Discharge: 2017-03-17 | Disposition: A | Discharge status: Home / Self Care | Payer: BLUE CROSS/BLUE SHIELD | Source: Ambulatory Visit | Attending: Family Medicine | Admitting: Family Medicine

## 2017-03-17 DIAGNOSIS — R9389 Abnormal findings on diagnostic imaging of other specified body structures: Secondary | ICD-10-CM

## 2017-03-17 DIAGNOSIS — R938 Abnormal findings on diagnostic imaging of other specified body structures: Secondary | ICD-10-CM | POA: Diagnosis not present

## 2017-03-17 IMAGING — CT CT CHEST W/ CM
2 of 3 series · 15 of 36 positions shown, 18 images · IV contrast (ISOVUE 300)
Comparison: None

CLINICAL DATA: Persistent abnormality of the RIGHT lung.

EXAM:
CT CHEST WITH CONTRAST
TECHNIQUE: Multidetector CT imaging of the chest was performed during
intravenous contrast administration.
CONTRAST:  80mL [UW] IOPAMIDOL ([UW]) INJECTION 61%

[Series 2: thorax · axial · 0.66mm/px · z∈[-260,-16]mm · 12 of 144 slices shown, 15 images]
[im 11/144  mediastinal]
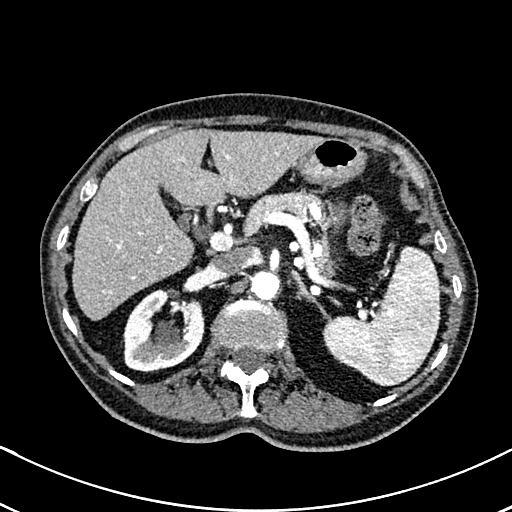
[im 11/144  lung]
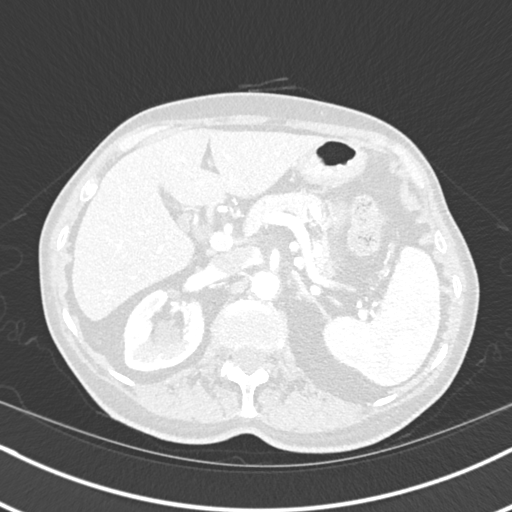
[im 22/144  lung]
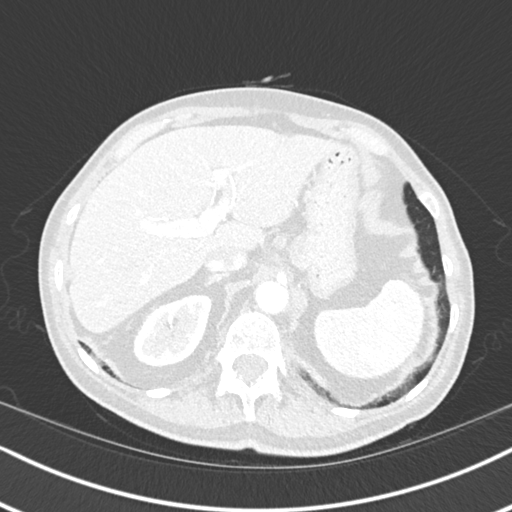
[im 32/144  lung]
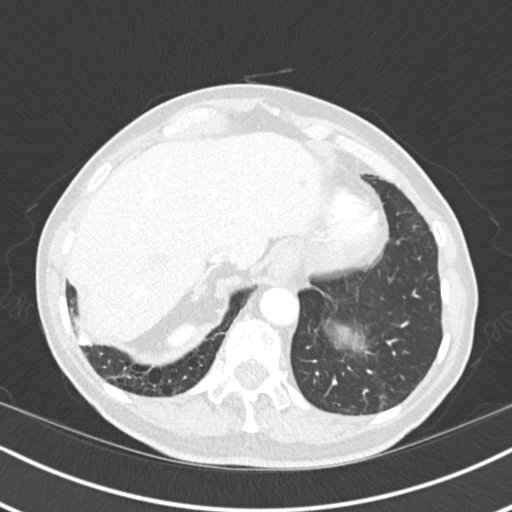
[im 43/144  lung]
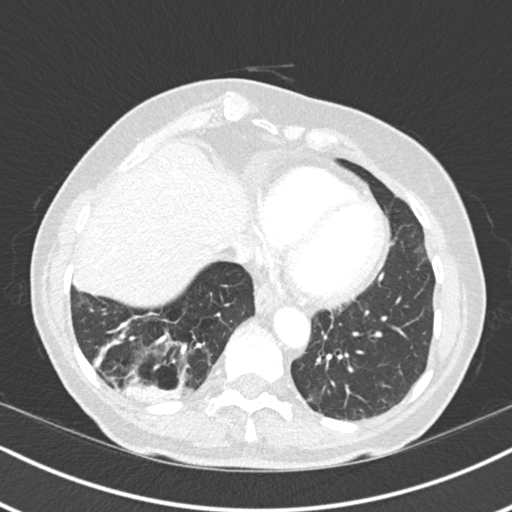
[im 53/144  mediastinal]
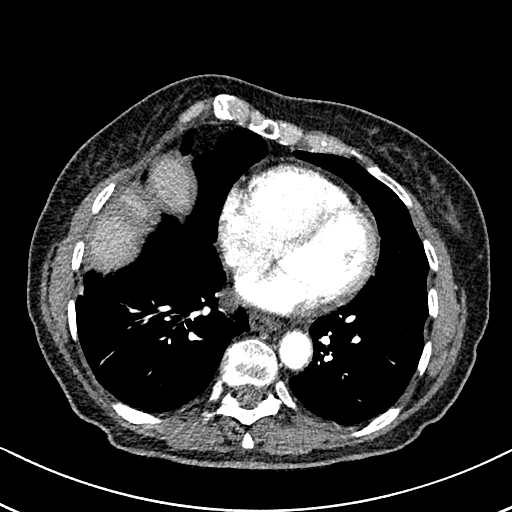
[im 53/144  lung]
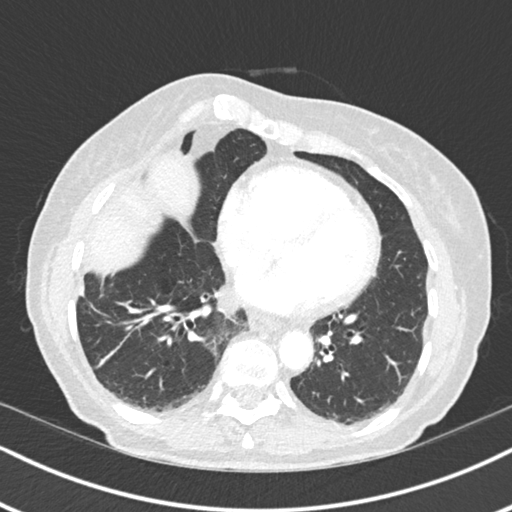
[im 64/144  lung]
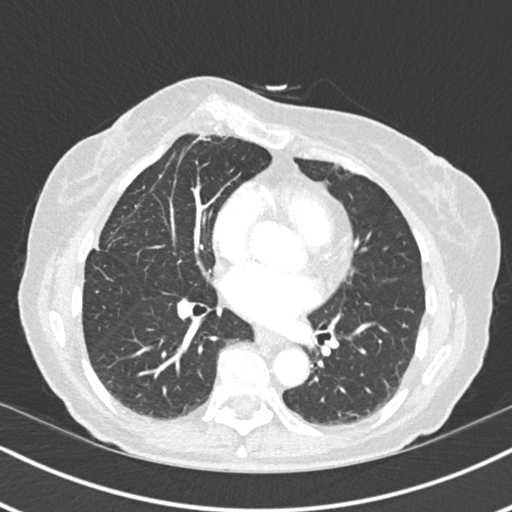
[im 80/144  lung]
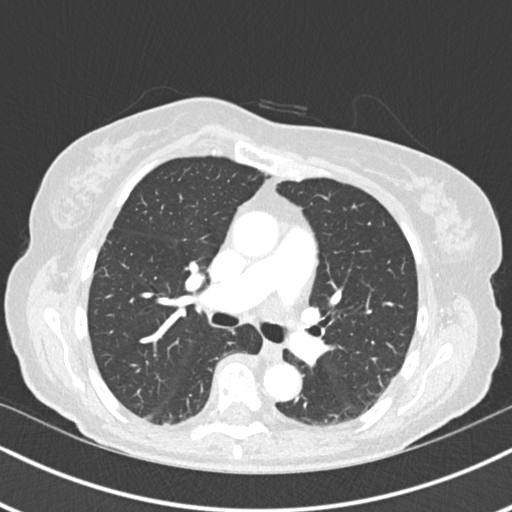
[im 91/144  lung]
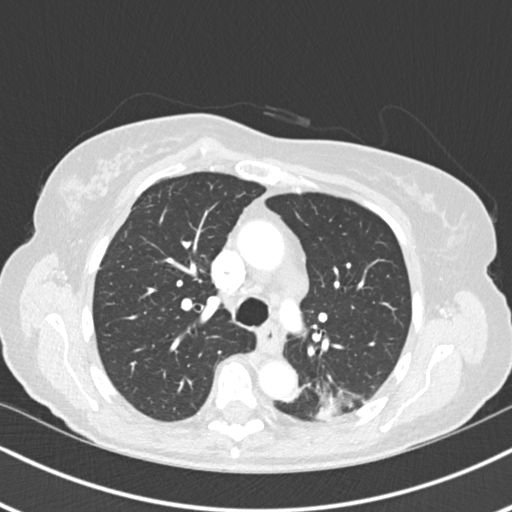
[im 101/144  mediastinal]
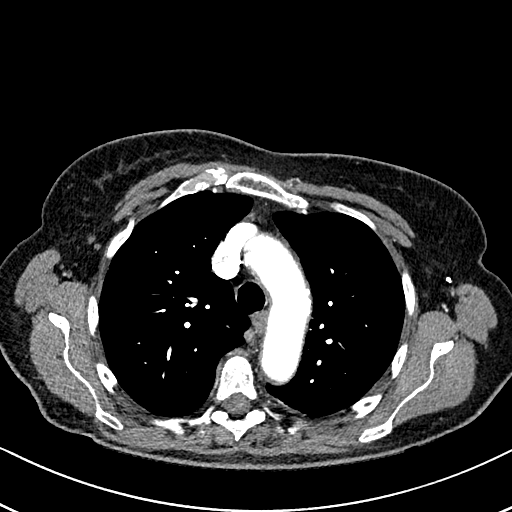
[im 101/144  lung]
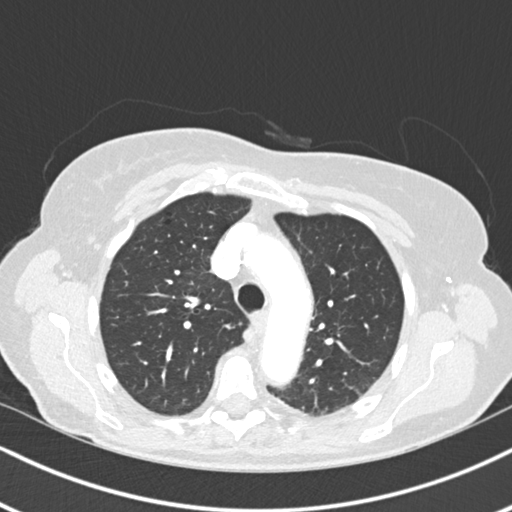
[im 112/144  lung]
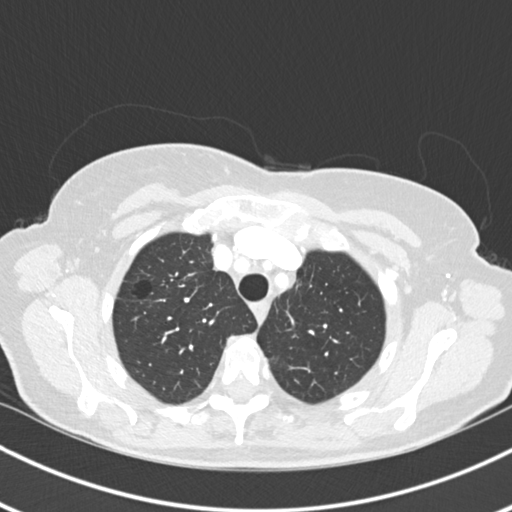
[im 122/144  lung]
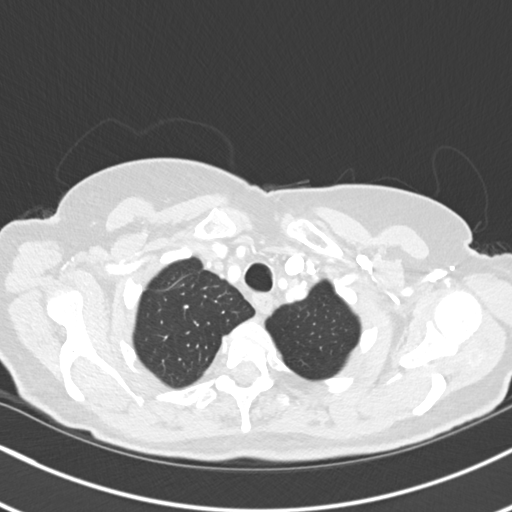
[im 133/144  lung]
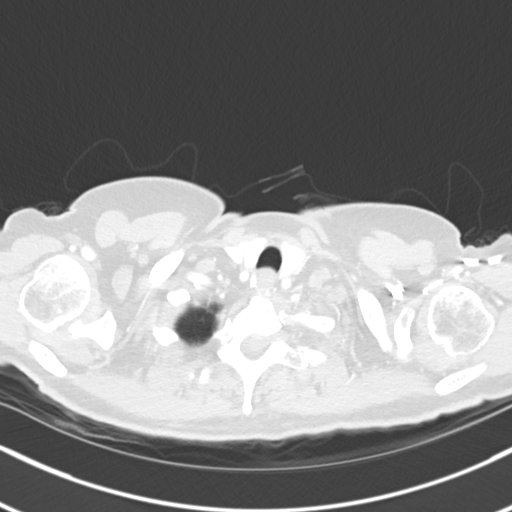

[Series 5: coronal · coronal · 0.59mm/px · 3 of 117 slices shown]
[im 24/117  lung]
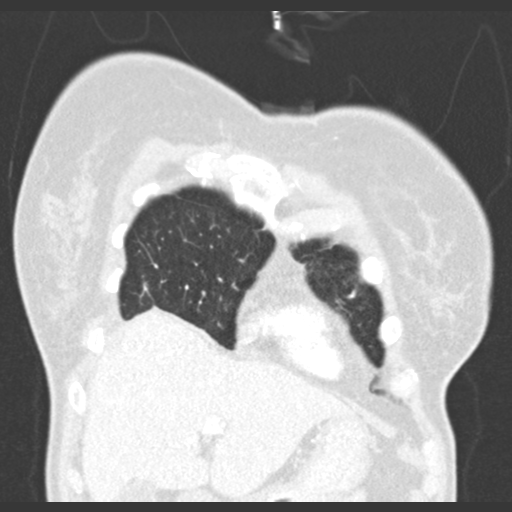
[im 47/117  lung]
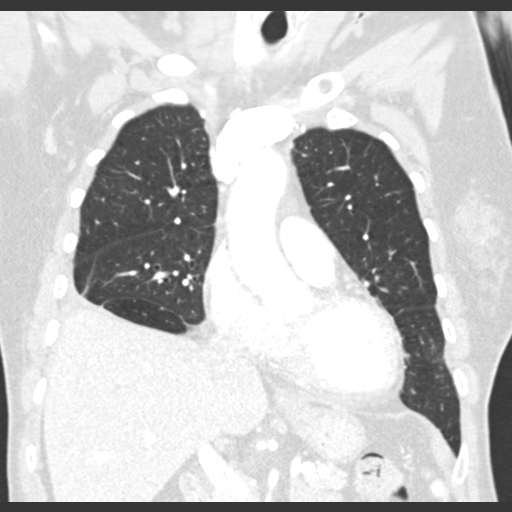
[im 70/117  lung]
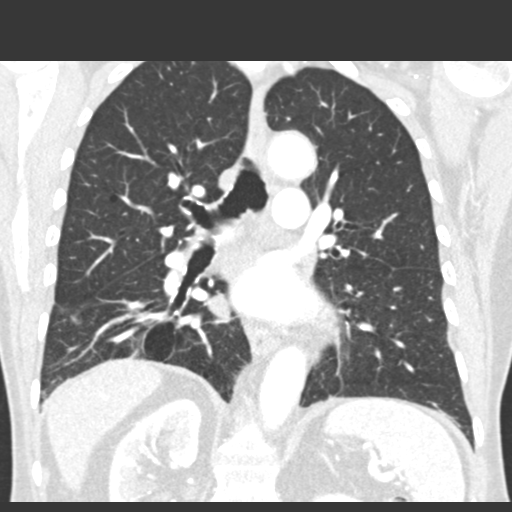

[15 of 36 positions shown; findings below may reference images not displayed]

FINDINGS: Cardiovascular: No significant vascular findings. Normal heart size.
No pericardial effusion.

Mediastinum/Nodes: No axillary supraclavicular adenopathy. No
mediastinal hilar adenopathy. No pericardial fluid. Esophagus
normal.

Lungs/Pleura: There is a band of pleural-parenchymal thickening in
the posterior LEFT upper lobe anterior to the oblique fissure
measuring 11 by 13 mm (image 56, series 3). There is mild
atelectasis the RIGHT lung base. There is pleural-parenchymal linear
thickening at the RIGHT lung base laterally (image VI, series 3). No
airspace disease. No suspicious pulmonary nodularity. Airways appear
normal.

Upper Abdomen: Limited view of the liver, kidneys, pancreas are
unremarkable. Normal adrenal glands. There is benign cysts along the
falciform ligament measuring 15 mm. Adrenal glands normal.

Musculoskeletal: No acute osseous findings.
IMPRESSION: Benign pleural-parenchymal thickening in the LEFT upper lobe and
RIGHT lower lobe. RIGHT lower lobe findings may relate to prior
trauma described in history. No acute cardiopulmonary findings. No
suspicious pulmonary nodularity.

## 2017-03-17 MED ORDER — IOPAMIDOL (ISOVUE-300) INJECTION 61%
80.0000 mL | Freq: Once | INTRAVENOUS | Status: AC | PRN
  Administered 2017-03-17: 80 mL via INTRAVENOUS

## 2017-03-19 ENCOUNTER — Encounter: Payer: Self-pay | Admitting: Family Medicine

## 2017-03-19 DIAGNOSIS — M8580 Other specified disorders of bone density and structure, unspecified site: Secondary | ICD-10-CM | POA: Insufficient documentation

## 2017-04-17 ENCOUNTER — Encounter: Payer: Self-pay | Admitting: Family Medicine

## 2017-07-17 ENCOUNTER — Encounter: Payer: Self-pay | Admitting: Family Medicine

## 2017-08-18 ENCOUNTER — Other Ambulatory Visit: Payer: BLUE CROSS/BLUE SHIELD

## 2017-08-24 ENCOUNTER — Encounter: Payer: Self-pay | Admitting: Family Medicine

## 2017-08-24 NOTE — Progress Notes (Signed)
HPI:   Ms.Arilynn Jalayah Gutridge is a 82 y.o. female, who is here today for her routine physical.  Last CPE: 08/18/16.  Regular exercise 3 or more time per week: Yes, water class 3 times per week and yoga. With the weather changing she is planning on starting silver sneakers class. Following a healthy diet: Not consistently since she moved with her daughter.She is eating more red meat and other foods prepared by son in law.  She lives with her daughter and son in Social worker. Planning on moving to her place, building a house next to her daughter's house.  Independent ADL's except for occasional urine leakage. Independent IADL's.  No falls in the past year and denies depression symptoms.   Chronic medical problems: Generalized OA,peripheral neuropathy,dry eye synd, hearing loss, GERD, lower back pain with occasional radiation to RLE (S/P back surgery), overactive bladder, glaucoma, HLD.  Lab Results  Component Value Date   CHOL 242 (H) 08/18/2016   HDL 67.20 08/18/2016   LDLCALC 136 (H) 08/18/2016   TRIG 190.0 (H) 08/18/2016   CHOLHDL 4 08/18/2016    She is on Celebrex 200 mg daily as needed for generalized OA, med helps.She denies side effects. Cymbalta 20 mg daily for peripheral neuropathy.    Last eye exam 07/21/17, Dr Lottie Dawson. She also follows with dermatologists, Dr  Dr Terri [facility], Dr Sherron Monday.  + Noturia x 2-3, stable.   Immunization History  Administered Date(s) Administered  . Influenza, High Dose Seasonal PF 08/18/2016  . Influenza-Unspecified 08/19/2014  . Pneumococcal Conjugate-13 06/03/2014  . Pneumococcal Polysaccharide-23 05/25/2015  . Tdap 02/26/2015  . Zoster 06/29/2011    Mammogram: 2017 Colonoscopy: 2016. DEXA: Osteopenia. She is taking Ca++ and Vit D   She has no concerns today.   Review of Systems  Constitutional: Negative for appetite change, fatigue and fever.  HENT: Positive for hearing loss. Negative for mouth sores, trouble  swallowing and voice change.   Eyes: Negative for redness and visual disturbance.  Respiratory: Negative for cough, shortness of breath and wheezing.   Cardiovascular: Negative for chest pain and leg swelling.  Gastrointestinal: Negative for abdominal pain, nausea and vomiting.       No changes in bowel habits.  Endocrine: Negative for cold intolerance, heat intolerance, polydipsia, polyphagia and polyuria.  Genitourinary: Negative for decreased urine volume, dysuria and hematuria.  Musculoskeletal: Positive for arthralgias. Negative for neck pain.  Skin: Negative for color change and rash.  Neurological: Negative for syncope, weakness and headaches.  Psychiatric/Behavioral: Negative for confusion and sleep disturbance. The patient is not nervous/anxious.   All other systems reviewed and are negative.     Current Outpatient Prescriptions on File Prior to Visit  Medication Sig Dispense Refill  . Ascorbic Acid (VITAMIN C) 1000 MG tablet Take 1,000 mg by mouth daily.    . Biotin 16109 MCG TABS Take 1 tablet by mouth daily.    . Cholecalciferol (VITAMIN D3) 2000 units TABS Take 1 tablet by mouth daily.    . dorzolamide (TRUSOPT) 2 % ophthalmic solution 1 drop 2 (two) times daily.    Marland Kitchen GLUCOSAMINE-CHONDROITIN-MSM PO Take by mouth. Take 2 oz. Daily.  Glucosamine 2,000 mg Chondroitin 1,200 mg, MSM 500 mg, Hyaluronic Acid 10 mg.    . Lifitegrast (XIIDRA) 5 % SOLN Apply 2 drops to eye daily.    . Magnesium 250 MG TABS Take 1 tablet by mouth daily.    . psyllium (METAMUCIL) 58.6 % packet Take 1 packet by  mouth daily.    . solifenacin (VESICARE) 10 MG tablet Take 10 mg by mouth daily.     No current facility-administered medications on file prior to visit.      Past Medical History:  Diagnosis Date  . Arthritis   . Chicken pox   . GERD (gastroesophageal reflux disease)   . Glaucoma   . History of frequent urinary tract infections   . Hyperlipidemia   . Urine incontinence     Past  Surgical History:  Procedure Laterality Date  . ABDOMINAL HYSTERECTOMY    . APPENDECTOMY    . BREAST BIOPSY    . TONSILLECTOMY AND ADENOIDECTOMY      Allergies  Allergen Reactions  . Protonix [Pantoprazole Sodium]     Family History  Problem Relation Age of Onset  . Hyperlipidemia Mother   . Heart disease Mother   . Hypertension Mother   . Mental illness Mother   . Cancer Father   . Alcohol abuse Brother   . Drug abuse Brother   . Cancer Brother   . Cancer Maternal Aunt     Social History   Social History  . Marital status: Widowed    Spouse name: N/A  . Number of children: N/A  . Years of education: N/A   Social History Main Topics  . Smoking status: Never Smoker  . Smokeless tobacco: Never Used  . Alcohol use No  . Drug use: No  . Sexual activity: No   Other Topics Concern  . None   Social History Narrative  . None     Vitals:   08/25/17 0802  BP: 118/80  Pulse: 91  Resp: 12  SpO2: 94%   Body mass index is 25.73 kg/m.   Wt Readings from Last 3 Encounters:  08/25/17 152 lb 4 oz (69.1 kg)  01/23/17 146 lb 4 oz (66.3 kg)  01/02/17 149 lb (67.6 kg)    Physical Exam  Nursing note and vitals reviewed. Constitutional: She is oriented to person, place, and time. She appears well-developed and well-nourished. No distress.  HENT:  Head: Normocephalic and atraumatic.  Right Ear: Tympanic membrane, external ear and ear canal normal. Decreased hearing is noted.  Left Ear: Tympanic membrane, external ear and ear canal normal. Decreased hearing is noted.  Mouth/Throat: Uvula is midline, oropharynx is clear and moist and mucous membranes are normal.  No hearing aids today. Excess cerumen bilateral.  Eyes: Pupils are equal, round, and reactive to light. Conjunctivae and EOM are normal.  Neck: No tracheal deviation present. No thyromegaly present.  Cardiovascular: Normal rate and regular rhythm.   No murmur heard. Pulses:      Dorsalis pedis pulses are  2+ on the right side, and 2+ on the left side.  Respiratory: Effort normal and breath sounds normal. No respiratory distress.  GI: Soft. She exhibits no mass. There is no hepatomegaly. There is no tenderness.  Musculoskeletal: She exhibits no edema.  No signs of synovitis appreciated. Mild deformities IP toes and hands.   Lymphadenopathy:    She has no cervical adenopathy.       Right: No supraclavicular adenopathy present.       Left: No supraclavicular adenopathy present.  Neurological: She is alert and oriented to person, place, and time. She has normal strength. No cranial nerve deficit. Coordination normal.  Reflex Scores:      Bicep reflexes are 2+ on the right side and 2+ on the left side. Patellar DTR's 1-2+,symmetric bilateral. Stable gait,  no assistance needed.  Skin: Skin is warm. No rash noted. No erythema.  Psychiatric: She has a normal mood and affect. Cognition and memory are normal.  Well groomed, good eye contact.     ASSESSMENT AND PLAN:  Ms. Maday was seen today for annual exam.  Diagnoses and all orders for this visit:  Lab Results  Component Value Date   CHOL 216 (H) 08/25/2017   HDL 63.50 08/25/2017   LDLCALC 127 (H) 08/25/2017   TRIG 131.0 08/25/2017   CHOLHDL 3 08/25/2017   Lab Results  Component Value Date   CREATININE 0.75 08/25/2017   BUN 16 08/25/2017   NA 141 08/25/2017   K 4.2 08/25/2017   CL 104 08/25/2017   CO2 28 08/25/2017   Lab Results  Component Value Date   ALT 23 08/25/2017   AST 25 08/25/2017   ALKPHOS 66 08/25/2017   BILITOT 0.6 08/25/2017    Routine general medical examination at a health care facility  We discussed the importance of regular physical activity and healthy diet for prevention of chronic illness and/or complications. Preventive guidelines reviewed. Vaccination up to date, influenza vaccine given today. Fall precautions.  Multivitamin and supplements she is taking reviewed. Some of her supplements have  same components. Instructed to check labels. Ca++ and vit D supplementation 1200 mg and [phone] U respectively recommended. Next CPE in 1 year.  Mixed hyperlipidemia  For now continue non pharmacologic treatment. Further recommendations will be given according to lab results.  -     Lipid panel -     Basic metabolic panel -     Hepatic function panel  Breast cancer screening -     Mammogram Digital Screening; Future  Generalized osteoarthritis of multiple sites  We clearly discussed side effects of NSAID's. Pain well controlled with current management, so no changes.  -     celecoxib (CELEBREX) 200 MG capsule; Take 1 capsule (200 mg total) by mouth daily.  Peripheral neuropathic pain  Stable. No changes in current management.  -     DULoxetine (CYMBALTA) 20 MG capsule; Take 1 capsule (20 mg total) by mouth daily.  Need for influenza vaccination -     Flu vaccine HIGH DOSE PF     Return in 6 months (on 02/22/2018) for OA.       Cira Deyoe G. [place], MD  [facility]. [place] office.

## 2017-08-25 ENCOUNTER — Encounter: Payer: Self-pay | Admitting: Family Medicine

## 2017-08-25 ENCOUNTER — Ambulatory Visit (INDEPENDENT_AMBULATORY_CARE_PROVIDER_SITE_OTHER): Payer: BLUE CROSS/BLUE SHIELD | Admitting: Family Medicine

## 2017-08-25 VITALS — BP 118/80 | HR 91 | Resp 12 | Ht 64.5 in | Wt 152.2 lb

## 2017-08-25 DIAGNOSIS — E782 Mixed hyperlipidemia: Secondary | ICD-10-CM

## 2017-08-25 DIAGNOSIS — Z0000 Encounter for general adult medical examination without abnormal findings: Secondary | ICD-10-CM

## 2017-08-25 DIAGNOSIS — Z1231 Encounter for screening mammogram for malignant neoplasm of breast: Secondary | ICD-10-CM | POA: Diagnosis not present

## 2017-08-25 DIAGNOSIS — M159 Polyosteoarthritis, unspecified: Secondary | ICD-10-CM

## 2017-08-25 DIAGNOSIS — Z23 Encounter for immunization: Secondary | ICD-10-CM

## 2017-08-25 DIAGNOSIS — Z1239 Encounter for other screening for malignant neoplasm of breast: Secondary | ICD-10-CM

## 2017-08-25 DIAGNOSIS — M792 Neuralgia and neuritis, unspecified: Secondary | ICD-10-CM

## 2017-08-25 LAB — LIPID PANEL
CHOLESTEROL: 216 mg/dL — AB (ref 0–200)
HDL: 63.5 mg/dL (ref >39.00)
LDL Cholesterol: 127 mg/dL — ABNORMAL HIGH (ref 0–99)
NONHDL: 152.77
Total CHOL/HDL Ratio: 3
Triglycerides: 131 mg/dL (ref 0.0–149.0)
VLDL: 26.2 mg/dL (ref 0.0–40.0)

## 2017-08-25 LAB — BASIC METABOLIC PANEL
BUN: 16 mg/dL (ref 6–23)
CO2: 28 meq/L (ref 19–32)
Calcium: 9.4 mg/dL (ref 8.4–10.5)
Chloride: 104 mEq/L (ref 96–112)
Creatinine, Ser: 0.75 mg/dL (ref 0.40–1.20)
GFR: 78.99 mL/min (ref >60.00)
GLUCOSE: 93 mg/dL (ref 70–99)
POTASSIUM: 4.2 meq/L (ref 3.5–5.1)
SODIUM: 141 meq/L (ref 135–145)

## 2017-08-25 LAB — HEPATIC FUNCTION PANEL
ALBUMIN: 4.2 g/dL (ref 3.5–5.2)
ALK PHOS: 66 U/L (ref 39–117)
ALT: 23 U/L (ref 0–35)
AST: 25 U/L (ref 0–37)
BILIRUBIN DIRECT: 0.1 mg/dL (ref 0.0–0.3)
Total Bilirubin: 0.6 mg/dL (ref 0.2–1.2)
Total Protein: 6.3 g/dL (ref 6.0–8.3)

## 2017-08-25 MED ORDER — DULOXETINE HCL 20 MG PO CPEP: 20.0000 mg | ORAL_CAPSULE | Freq: Every day | ORAL | 1 refills | Status: DC

## 2017-08-25 MED ORDER — CELECOXIB 200 MG PO CAPS: 200.0000 mg | ORAL_CAPSULE | Freq: Every day | ORAL | 1 refills | Status: DC

## 2017-08-25 NOTE — Patient Instructions (Addendum)
A few things to remember from today's visit:   Routine general medical examination at a health care facility  Mixed hyperlipidemia - Plan: Lipid panel, Basic metabolic panel  Breast cancer screening - Plan: Mammogram Digital Screening    At least 150 minutes of moderate exercise per week, daily brisk walking for 15-30 min is a good exercise option. Healthy diet low in saturated (animal) fats and sweets and consisting of fresh fruits and vegetables, lean meats such as fish and white chicken and whole grains.   - Vaccines:   A few tips:  -As we age balance is not as good as it was, so there is a higher risks for falls. Please remove small rugs and furniture that is "in your way" and could increase the risk of falls. Stretching exercises may help with fall prevention: Yoga and Tai Chi are some examples. Low impact exercise is better, so you are not very achy the next day.  -Sun screen and avoidance of direct sun light recommended. Caution with dehydration, if working outdoors be sure to drink enough fluids.  - Some medications are not safe as we age, increases the risk of side effects and can potentially interact with other medication you are also taken;  including some of over the counter medications. Be sure to let me know when you start a new medication even if it is a dietary/vitamin supplement.   -Healthy diet low in red meet/animal fat and sugar + regular physical activity is recommended.      -Breast cancer:  Mammogram: There is disagreement between experts about when to start screening in low risk asymptomatic female but recent recommendations are to start screening at 63 and not later than 80 years old , every 1-2 years and after 80 yo q 2 years. Screening is recommended until 80 years old but some women can continue screening depending of healthy issues.   Also recommended:  1. Dental visit- Brush and floss your teeth twice daily; visit your dentist twice a year. 2. Eye  doctor- Get an eye exam at least every 2 years. 3. Helmet use- Always wear a helmet when riding a bicycle, motorcycle, rollerblading or skateboarding. 4. Safe sex- If you may be exposed to sexually transmitted infections, use a condom. 5. Seat belts- Seat belts can save your live; always wear one. 6. Smoke/Carbon Monoxide detectors- These detectors need to be installed on the appropriate level of your home. Replace batteries at least once a year. 7. Skin cancer- When out in the sun please cover up and use sunscreen 15 SPF or higher. 8. Violence- If anyone is threatening or hurting you, please tell your healthcare provider.  9. Drink alcohol in moderation- Limit alcohol intake to one drink or less per day. Never drink and drive.  Please be sure medication list is accurate. If a new problem present, please set up appointment sooner than planned today.

## 2017-08-26 ENCOUNTER — Encounter: Payer: Self-pay | Admitting: Family Medicine

## 2017-08-27 ENCOUNTER — Encounter: Payer: Self-pay | Admitting: Family Medicine

## 2017-09-08 ENCOUNTER — Ambulatory Visit
Admission: RE | Admit: 2017-09-08 | Discharge: 2017-09-08 | Disposition: A | Discharge status: Home / Self Care | Payer: BLUE CROSS/BLUE SHIELD | Source: Ambulatory Visit | Attending: Family Medicine | Admitting: Family Medicine

## 2017-09-08 DIAGNOSIS — Z1239 Encounter for other screening for malignant neoplasm of breast: Secondary | ICD-10-CM

## 2017-09-19 ENCOUNTER — Encounter: Payer: Self-pay | Admitting: Family Medicine

## 2017-10-05 DIAGNOSIS — H35372 Puckering of macula, left eye: Secondary | ICD-10-CM | POA: Insufficient documentation

## 2017-10-05 DIAGNOSIS — Z961 Presence of intraocular lens: Secondary | ICD-10-CM | POA: Insufficient documentation

## 2017-10-05 DIAGNOSIS — H353131 Nonexudative age-related macular degeneration, bilateral, early dry stage: Secondary | ICD-10-CM | POA: Insufficient documentation

## 2017-10-10 ENCOUNTER — Encounter: Payer: Self-pay | Admitting: Family Medicine

## 2017-10-10 DIAGNOSIS — M159 Polyosteoarthritis, unspecified: Secondary | ICD-10-CM

## 2017-10-11 MED ORDER — CELECOXIB 200 MG PO CAPS: 200.0000 mg | ORAL_CAPSULE | Freq: Every day | ORAL | 1 refills | Status: DC

## 2017-10-16 ENCOUNTER — Encounter: Payer: Self-pay | Admitting: Family Medicine

## 2017-10-16 ENCOUNTER — Telehealth: Payer: Self-pay | Admitting: Family Medicine

## 2017-10-16 NOTE — Telephone Encounter (Signed)
[place] [address] [place] [employer] form it was pt in the doctors folder.  Upon completion the pt would like to have a call back at [phone].  Pt is aware of the 3-5 business days to complete.

## 2017-10-18 NOTE — Telephone Encounter (Signed)
Informed patient that paperwork is ready for pick up. Patient stated she would be in on 10/19/17 to get forms.

## 2017-10-21 ENCOUNTER — Encounter: Payer: Self-pay | Admitting: Family Medicine

## 2017-10-23 ENCOUNTER — Other Ambulatory Visit: Payer: Self-pay | Admitting: Family Medicine

## 2017-10-23 ENCOUNTER — Encounter: Payer: Self-pay | Admitting: Family Medicine

## 2017-10-23 DIAGNOSIS — M159 Polyosteoarthritis, unspecified: Secondary | ICD-10-CM

## 2017-10-23 MED ORDER — CELECOXIB 200 MG PO CAPS: 200.0000 mg | ORAL_CAPSULE | Freq: Every day | ORAL | 1 refills | Status: DC | PRN

## 2017-10-24 ENCOUNTER — Ambulatory Visit: Payer: BLUE CROSS/BLUE SHIELD

## 2017-10-24 ENCOUNTER — Encounter: Payer: BLUE CROSS/BLUE SHIELD | Admitting: Sports Medicine

## 2017-10-24 NOTE — Patient Instructions (Signed)
Hammer Toe Hammer toe is a change in the shape (a deformity) of your second, third, or fourth toe. The deformity causes the middle joint of your toe to stay bent. This causes pain, especially when you are wearing shoes. Hammer toe starts gradually. At first, the toe can be straightened. Gradually over time, the deformity becomes stiff and permanent. Early treatments to keep the toe straight may relieve pain. As the deformity becomes stiff and permanent, surgery may be needed to straighten the toe. What are the causes? Hammer toe is caused by abnormal bending of the toe joint that is closest to your foot. It happens gradually over time. This pulls on the muscles and connections (tendons) of the toe joint, making them weak and stiff. It is often related to wearing shoes that are too short or narrow and do not let your toes straighten. What increases the risk? You may be at greater risk for hammer toe if you:  Are female.  Are older.  Wear shoes that are too small.  Wear high-heeled shoes that pinch your toes.  Are a ballet dancer.  Have a second toe that is longer than your big toe (first toe).  Injure your foot or toe.  Have arthritis.  Have a family history of hammer toe.  Have a nerve or muscle disorder.  What are the signs or symptoms? The main symptoms of this condition are pain and deformity of the toe. The pain is worse when wearing shoes, walking, or running. Other symptoms may include:  Corns or calluses over the bent part of the toe or between the toes.  Redness and a burning feeling on the toe.  An open sore that forms on the top of the toe.  Not being able to straighten the toe.  How is this diagnosed? This condition is diagnosed based on your symptoms and a physical exam. During the exam, your health care provider will try to straighten your toe to see how stiff the deformity is. You may also have tests, such as:  A blood test to check for rheumatoid  arthritis.  An X-ray to show how severe the deformity is.  How is this treated? Treatment for this condition will depend on how stiff the deformity is. Surgery is often needed. However, sometimes a hammer toe can be straightened without surgery. Treatments that do not involve surgery include:  Taping the toe into a straightened position.  Using pads and cushions to protect the toe (orthotics).  Wearing shoes that provide enough room for the toes.  Doing toe-stretching exercises at home.  Taking an NSAID to reduce pain and swelling.  If these treatments do not help or the toe cannot be straightened, surgery is the next option. The most common surgeries used to straighten a hammer toe include:  Arthroplasty. In this procedure, part of the joint is removed, and that allows the toe to straighten.  Fusion. In this procedure, cartilage between the two bones of the joint is taken out and the bones are fused together into one longer bone.  Implantation. In this procedure, part of the bone is removed and replaced with an implant to let the toe move again.  Flexor tendon transfer. In this procedure, the tendons that curl the toes down (flexor tendons) are repositioned.  Follow these instructions at home:  Take over-the-counter and prescription medicines only as told by your health care provider.  Do toe straightening and stretching exercises as told by your health care provider.  Keep all   follow-up visits as told by your health care provider. This is important. How is this prevented?  Wear shoes that give your toes enough room and do not cause pain.  Do not wear high-heeled shoes. Contact a health care provider if:  Your pain gets worse.  Your toe becomes red or swollen.  You develop an open sore on your toe. This information is not intended to replace advice given to you by your health care provider. Make sure you discuss any questions you have with your health care  provider. Document Released: 11/18/2000 Document Revised: 06/10/2016 Document Reviewed: 03/16/2016 Elsevier Interactive Patient Education  2018 Elsevier Inc.  

## 2017-10-25 NOTE — Progress Notes (Signed)
Patient canceled appt This encounter was created in error - please disregard. 

## 2017-11-07 ENCOUNTER — Encounter: Payer: Self-pay | Admitting: Sports Medicine

## 2017-11-07 ENCOUNTER — Ambulatory Visit (INDEPENDENT_AMBULATORY_CARE_PROVIDER_SITE_OTHER): Payer: BLUE CROSS/BLUE SHIELD

## 2017-11-07 ENCOUNTER — Ambulatory Visit: Payer: BLUE CROSS/BLUE SHIELD | Admitting: Sports Medicine

## 2017-11-07 VITALS — BP 103/63 | HR 77 | Resp 16 | Ht 64.5 in | Wt 150.0 lb

## 2017-11-07 DIAGNOSIS — M79672 Pain in left foot: Secondary | ICD-10-CM | POA: Diagnosis not present

## 2017-11-07 DIAGNOSIS — M2040 Other hammer toe(s) (acquired), unspecified foot: Secondary | ICD-10-CM

## 2017-11-07 DIAGNOSIS — Q828 Other specified congenital malformations of skin: Secondary | ICD-10-CM | POA: Diagnosis not present

## 2017-11-07 DIAGNOSIS — R29898 Other symptoms and signs involving the musculoskeletal system: Secondary | ICD-10-CM

## 2017-11-07 DIAGNOSIS — L909 Atrophic disorder of skin, unspecified: Secondary | ICD-10-CM

## 2017-11-07 DIAGNOSIS — M79671 Pain in right foot: Secondary | ICD-10-CM | POA: Diagnosis not present

## 2017-11-07 NOTE — Patient Instructions (Addendum)
Hammer Toe Hammer toe is a change in the shape (a deformity) of your second, third, or fourth toe. The deformity causes the middle joint of your toe to stay bent. This causes pain, especially when you are wearing shoes. Hammer toe starts gradually. At first, the toe can be straightened. Gradually over time, the deformity becomes stiff and permanent. Early treatments to keep the toe straight may relieve pain. As the deformity becomes stiff and permanent, surgery may be needed to straighten the toe. What are the causes? Hammer toe is caused by abnormal bending of the toe joint that is closest to your foot. It happens gradually over time. This pulls on the muscles and connections (tendons) of the toe joint, making them weak and stiff. It is often related to wearing shoes that are too short or narrow and do not let your toes straighten. What increases the risk? You may be at greater risk for hammer toe if you:  Are female.  Are older.  Wear shoes that are too small.  Wear high-heeled shoes that pinch your toes.  Are a ballet dancer.  Have a second toe that is longer than your big toe (first toe).  Injure your foot or toe.  Have arthritis.  Have a family history of hammer toe.  Have a nerve or muscle disorder.  What are the signs or symptoms? The main symptoms of this condition are pain and deformity of the toe. The pain is worse when wearing shoes, walking, or running. Other symptoms may include:  Corns or calluses over the bent part of the toe or between the toes.  Redness and a burning feeling on the toe.  An open sore that forms on the top of the toe.  Not being able to straighten the toe.  How is this diagnosed? This condition is diagnosed based on your symptoms and a physical exam. During the exam, your health care provider will try to straighten your toe to see how stiff the deformity is. You may also have tests, such as:  A blood test to check for rheumatoid  arthritis.  An X-ray to show how severe the deformity is.  How is this treated? Treatment for this condition will depend on how stiff the deformity is. Surgery is often needed. However, sometimes a hammer toe can be straightened without surgery. Treatments that do not involve surgery include:  Taping the toe into a straightened position.  Using pads and cushions to protect the toe (orthotics).  Wearing shoes that provide enough room for the toes.  Doing toe-stretching exercises at home.  Taking an NSAID to reduce pain and swelling.  If these treatments do not help or the toe cannot be straightened, surgery is the next option. The most common surgeries used to straighten a hammer toe include:  Arthroplasty. In this procedure, part of the joint is removed, and that allows the toe to straighten.  Fusion. In this procedure, cartilage between the two bones of the joint is taken out and the bones are fused together into one longer bone.  Implantation. In this procedure, part of the bone is removed and replaced with an implant to let the toe move again.  Flexor tendon transfer. In this procedure, the tendons that curl the toes down (flexor tendons) are repositioned.  Follow these instructions at home:  Take over-the-counter and prescription medicines only as told by your health care provider.  Do toe straightening and stretching exercises as told by your health care provider.  Keep all   follow-up visits as told by your health care provider. This is important. How is this prevented?  Wear shoes that give your toes enough room and do not cause pain.  Do not wear high-heeled shoes. Contact a health care provider if:  Your pain gets worse.  Your toe becomes red or swollen.  You develop an open sore on your toe. This information is not intended to replace advice given to you by your health care provider. Make sure you discuss any questions you have with your health care  provider. Document Released: 11/18/2000 Document Revised: 06/10/2016 Document Reviewed: 03/16/2016 Elsevier Interactive Patient Education  2018 [employer]  For tennis shoes recommend:  Anne [employer] Beast Ascis New balance Saucony Can be purchased at [employer] or [employer]  Vionic  SAS Can be purchased at [employer] or [employer]   For work shoes recommend: [employer] boots  Can be purchased at a variety of places or [employer], product[employer]process [employer]   For casual shoes recommend: Vionic  Can be purchased at [employer] or [employer]  [employer]

## 2017-11-07 NOTE — Progress Notes (Signed)
   Subjective:    Patient ID: Hailey MatarMarilyn Jo Millican, female    DOB: 10-28-1935, 82 y.o.   MRN: [id number]  HPI    Review of Systems  HENT: Positive for hearing loss and tinnitus.   Musculoskeletal: Positive for arthralgias.  All other systems reviewed and are negative.      Objective:   Physical Exam        Assessment & Plan:
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
## 2017-12-26 ENCOUNTER — Encounter: Payer: Self-pay | Admitting: Family Medicine

## 2018-01-02 ENCOUNTER — Encounter: Payer: Self-pay | Admitting: Family Medicine

## 2018-01-09 ENCOUNTER — Encounter: Payer: Self-pay | Admitting: Family Medicine

## 2018-01-30 ENCOUNTER — Other Ambulatory Visit: Payer: Self-pay | Admitting: Ophthalmology

## 2018-01-30 DIAGNOSIS — H353131 Nonexudative age-related macular degeneration, bilateral, early dry stage: Secondary | ICD-10-CM

## 2018-02-02 ENCOUNTER — Telehealth: Payer: Self-pay | Admitting: Family Medicine

## 2018-02-02 ENCOUNTER — Ambulatory Visit
Admission: RE | Admit: 2018-02-02 | Discharge: 2018-02-02 | Disposition: A | Discharge status: Home / Self Care | Payer: BLUE CROSS/BLUE SHIELD | Source: Ambulatory Visit | Attending: Ophthalmology | Admitting: Ophthalmology

## 2018-02-02 DIAGNOSIS — H353131 Nonexudative age-related macular degeneration, bilateral, early dry stage: Secondary | ICD-10-CM

## 2018-02-02 NOTE — Telephone Encounter (Signed)
Pt brought in DMV form to be completed.  Upon completion pt would like to be called at [phone].  Form was put in doctors folder.

## 2018-02-05 NOTE — Telephone Encounter (Signed)
Paperwork given to Dr. [place], will contact patient when completed.

## 2018-02-07 NOTE — Telephone Encounter (Signed)
Called patient to let her know that her form is ready for pick-up 

## 2018-02-17 ENCOUNTER — Encounter: Payer: Self-pay | Admitting: Sports Medicine

## 2018-03-06 ENCOUNTER — Encounter: Payer: Self-pay | Admitting: Sports Medicine

## 2018-03-06 ENCOUNTER — Ambulatory Visit: Payer: BLUE CROSS/BLUE SHIELD | Admitting: Sports Medicine

## 2018-03-06 DIAGNOSIS — B351 Tinea unguium: Secondary | ICD-10-CM | POA: Diagnosis not present

## 2018-03-06 DIAGNOSIS — L608 Other nail disorders: Secondary | ICD-10-CM

## 2018-03-06 DIAGNOSIS — M79675 Pain in left toe(s): Secondary | ICD-10-CM | POA: Diagnosis not present

## 2018-03-06 NOTE — Progress Notes (Signed)
Subjective: Hailey Perkins is a 83 y.o. female patient seen today in office with complaint of mildly painful thickened and discolored left 1st toenail that she has noticed over the last 3 weeks, does not recall injury. Admits to a past history of nail fungus in 2015 that was treated with antifungal given by her podiatrist at that time. Patient is still using toe cushions or separators. Patient has no other pedal complaints at this time.   Patient Active Problem List   Diagnosis Date Noted  . Osteopenia 03/19/2017  . Mixed hyperlipidemia 08/18/2016  . Generalized osteoarthritis of multiple sites 08/18/2016  . Peripheral neuropathic pain 08/18/2016  . Overactive bladder 08/18/2016  . Glaucoma 08/18/2016  . Cervical disc disorder with radiculopathy of cervical region 08/17/2016  . Carpal tunnel syndrome, right upper limb 08/17/2016    Current Outpatient Medications on File Prior to Visit  Medication Sig Dispense Refill  . Ascorbic Acid (VITAMIN C) 1000 MG tablet Take 1,000 mg by mouth daily.    . Biotin 10000 MCG TABS Take 1 tablet by mouth daily.    . celecoxib (CELEBREX) 200 MG capsule Take 1 capsule (200 mg total) daily as needed by mouth. 90 capsule 1  . cevimeline (EVOXAC) 30 MG capsule TK 1 C PO TID  0  . Cholecalciferol (VITAMIN D3) 2000 units TABS Take 1 tablet by mouth daily.    . dorzolamide (TRUSOPT) 2 % ophthalmic solution 1 drop 2 (two) times daily.    . DULoxetine (CYMBALTA) 20 MG capsule Take 1 capsule (20 mg total) by mouth daily. 90 capsule 1  . GLUCOSAMINE-CHONDROITIN-MSM PO Take by mouth. Take 2 oz. Daily.  Glucosamine 2,000 mg Chondroitin 1,200 mg, MSM 500 mg, Hyaluronic Acid 10 mg.    . Lifitegrast (XIIDRA) 5 % SOLN Apply 2 drops to eye daily.    . Magnesium 250 MG TABS Take 1 tablet by mouth daily.    . Multiple Vitamin (MULTIVITAMIN) tablet Take 1 tablet by mouth daily.    . Multiple Vitamins-Minerals (PRESERVISION AREDS 2 PO) Take 2 capsules by mouth daily.    .  pilocarpine (SALAGEN) 5 MG tablet Take 5 mg by mouth 3 (three) times daily.    . Plant Sterols and Stanols (CHOLESTOFF PLUS PO) Take 1 capsule by mouth daily.    . Polyvinyl Alcohol-Povidone (FRESHKOTE) 2.7-2 % SOLN Place 1 drop into both eyes 4 times daily.    . psyllium (METAMUCIL) 58.6 % packet Take 1 packet by mouth daily.    . Sodium Fluoride (PREVIDENT 5000 BOOSTER PLUS) 1.1 % PSTE Place onto teeth daily.    . solifenacin (VESICARE) 10 MG tablet Take 10 mg by mouth daily.    . Turmeric (CURCUMIN 95) 500 MG CAPS Take 1 capsule by mouth 2 (two) times daily.     No current facility-administered medications on file prior to visit.     Allergies  Allergen Reactions  . Protonix [Pantoprazole Sodium]     Pt stated, "Blood pin-point rash"  . Tape     Adhesive Tape; pt stated "itch and redness"    Objective: Physical Exam  General: Well developed, nourished, no acute distress, awake, alert and oriented x 3  Vascular: Dorsalis pedis artery 1/4 bilateral, Posterior tibial artery 1/4 bilateral, skin temperature warm to warm proximal to distal bilateral lower extremities, no varicosities, pedal hair present bilateral.  Neurological: Gross sensation present via light touch bilateral.   Dermatological: Skin is warm, dry, and supple bilateral, Left great toenail is tender,  short thick, and discolored with mild subungal debris and dry blood, no webspace macerations present bilateral, no open lesions present bilateral, minimal callus/hyperkeratotic tissue present sub met 5 on right. No signs of infection bilateral.  Musculoskeletal: + Bunion and hammertoe boney deformities noted bilateral. Muscular strength within normal limits without pain on range of motion. No pain with calf compression bilateral.  Assessment and Plan:  Problem List Items Addressed This Visit    None    Visit Diagnoses    Nail fungus    -  Primary   Relevant Orders   Culture, fungus without smear   Nail hemorrhage        Toe pain, left         -Examined patient -Discussed treatment options for painful dystrophic left 1st toenail with heme -Fungal culture was obtained by removing a portion of the hard nail itself from each of the involved toenails using a sterile nail nipper and sent to [facility] lab. Patient tolerated the biopsy procedure well without discomfort or need for anesthesia.  -Patient to return in 4 weeks for follow up evaluation and discussion of fungal culture results or sooner if symptoms worsen.  Landis Martins, DPM

## 2018-03-10 DIAGNOSIS — H4052X2 Glaucoma secondary to other eye disorders, left eye, moderate stage: Secondary | ICD-10-CM | POA: Insufficient documentation

## 2018-04-03 ENCOUNTER — Ambulatory Visit: Payer: BLUE CROSS/BLUE SHIELD | Admitting: Sports Medicine

## 2018-04-09 ENCOUNTER — Encounter: Payer: Self-pay | Admitting: Sports Medicine

## 2018-04-12 ENCOUNTER — Other Ambulatory Visit: Payer: Self-pay | Admitting: Family Medicine

## 2018-04-12 DIAGNOSIS — M792 Neuralgia and neuritis, unspecified: Secondary | ICD-10-CM

## 2018-04-17 ENCOUNTER — Encounter: Payer: Self-pay | Admitting: Sports Medicine

## 2018-04-17 ENCOUNTER — Ambulatory Visit: Payer: BLUE CROSS/BLUE SHIELD | Admitting: Sports Medicine

## 2018-04-17 DIAGNOSIS — M79675 Pain in left toe(s): Secondary | ICD-10-CM | POA: Diagnosis not present

## 2018-04-17 DIAGNOSIS — R319 Hematuria, unspecified: Secondary | ICD-10-CM | POA: Insufficient documentation

## 2018-04-17 DIAGNOSIS — L603 Nail dystrophy: Secondary | ICD-10-CM | POA: Diagnosis not present

## 2018-04-17 DIAGNOSIS — M2040 Other hammer toe(s) (acquired), unspecified foot: Secondary | ICD-10-CM | POA: Diagnosis not present

## 2018-04-17 DIAGNOSIS — L608 Other nail disorders: Secondary | ICD-10-CM

## 2018-04-17 NOTE — Progress Notes (Signed)
Subjective: Hailey Perkins is a 83 y.o. female patient seen today in office for fungal culture results. Patient denies any acute changes since last visit and has been using her toe cushions. Patient has no other pedal complaints at this time.   Patient Active Problem List   Diagnosis Date Noted  . Blood in urine 04/17/2018  . Secondary open-angle glaucoma of left eye, moderate stage 03/10/2018  . Early stage 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 10/05/2017  . Epiretinal membrane (ERM) of left eye 10/05/2017  . Pseudophakia of both eyes 10/05/2017  . Osteopenia 03/19/2017  . Mixed hyperlipidemia 08/18/2016  . Generalized osteoarthritis of multiple sites 08/18/2016  . Peripheral neuropathic pain 08/18/2016  . Overactive bladder 08/18/2016  . Glaucoma 08/18/2016  . Cervical disc disorder with radiculopathy of cervical region 08/17/2016  . Carpal tunnel syndrome, right upper limb 08/17/2016  . Abnormal finding on mammography 06/14/2016  . Breast lump 05/06/2015    Current Outpatient Medications on File Prior to Visit  Medication Sig Dispense Refill  . Ascorbic Acid (VITAMIN C) 1000 MG tablet Take 1,000 mg by mouth daily.    . Biotin 10000 MCG TABS Take 1 tablet by mouth daily.    . celecoxib (CELEBREX) 200 MG capsule Take 1 capsule (200 mg total) daily as needed by mouth. 90 capsule 1  . cevimeline (EVOXAC) 30 MG capsule TK 1 C PO TID  0  . Cholecalciferol (VITAMIN D3) 2000 units TABS Take 1 tablet by mouth daily.    . dorzolamide (TRUSOPT) 2 % ophthalmic solution 1 drop 2 (two) times daily.    . DULoxetine (CYMBALTA) 20 MG capsule TAKE 1 CAPSULE DAILY 30 capsule 0  . GLUCOSAMINE-CHONDROITIN-MSM PO Take by mouth. Take 2 oz. Daily.  Glucosamine 2,000 mg Chondroitin 1,200 mg, MSM 500 mg, Hyaluronic Acid 10 mg.    . Lifitegrast (XIIDRA) 5 % SOLN Apply 2 drops to eye daily.    . Magnesium 250 MG TABS Take 1 tablet by mouth daily.    . mirabegron ER (MYRBETRIQ) 50  MG TB24 tablet Take by mouth.    . Multiple Vitamin (MULTIVITAMIN) tablet Take 1 tablet by mouth daily.    . Multiple Vitamins-Minerals (PRESERVISION AREDS 2 PO) Take 2 capsules by mouth daily.    . pilocarpine (SALAGEN) 5 MG tablet Take 5 mg by mouth 3 (three) times daily.    . Plant Sterols and Stanols (CHOLESTOFF PLUS PO) Take 1 capsule by mouth daily.    . Polyvinyl Alcohol-Povidone (FRESHKOTE) 2.7-2 % SOLN Place 1 drop into both eyes 4 times daily.    . psyllium (METAMUCIL) 58.6 % packet Take 1 packet by mouth daily.    . Sodium Fluoride (PREVIDENT 5000 BOOSTER PLUS) 1.1 % PSTE Place onto teeth daily.    . solifenacin (VESICARE) 10 MG tablet Take 10 mg by mouth daily.    . Turmeric (CURCUMIN 95) 500 MG CAPS Take 1 capsule by mouth 2 (two) times daily.     No current facility-administered medications on file prior to visit.     Allergies  Allergen Reactions  . Protonix [Pantoprazole Sodium]     Pt stated, "Blood pin-point rash"  . Tape     Adhesive Tape; pt stated "itch and redness"    Objective: Physical Exam  General: Well developed, nourished, no acute distress, awake, alert and oriented x 3  Vascular: Dorsalis pedis artery 1/4 bilateral, Posterior tibial artery 1/4 bilateral, skin temperature warm to warm proximal to distal bilateral lower  extremities, no varicosities, pedal hair present bilateral.  Neurological: Gross sensation present via light touch bilateral.   Dermatological: Skin is warm, dry, and supple bilateral, Left great toenail is short thick, and discolored with mild subungal debris and dry blood, no webspace macerations present bilateral, no open lesions present bilateral, minimal callus/hyperkeratotic tissue present sub met 5 on right. No signs of infection bilateral.  Musculoskeletal: + Bunion and hammertoe boney deformities noted bilateral. Muscular strength within normal limits without pain on range of motion. No pain with calf compression  bilateral.  Fungal culture: Negative supportive of Microtrauma  Assessment and Plan:  Problem List Items Addressed This Visit    None    Visit Diagnoses    Nail hemorrhage    -  Primary   Nail dystrophy       Toe pain, left       Hammer toe, unspecified laterality         -Examined patient -Discussed treatment options for dystrophic nails -Fungal culture negative -Recommend shoe modification, cushions to toes, and tea tree oil, vinegar soaks and topical therapy to help with discoloration to nails -Advised good hygiene habits -Patient to return PRN or sooner if symptoms worsen.  Landis Martins, DPM

## 2018-04-21 ENCOUNTER — Other Ambulatory Visit: Payer: Self-pay | Admitting: Family Medicine

## 2018-04-21 DIAGNOSIS — M792 Neuralgia and neuritis, unspecified: Secondary | ICD-10-CM

## 2018-04-26 ENCOUNTER — Encounter: Payer: Self-pay | Admitting: Family Medicine

## 2018-05-07 ENCOUNTER — Encounter: Payer: Self-pay | Admitting: Sports Medicine

## 2018-05-24 ENCOUNTER — Encounter: Payer: Self-pay | Admitting: Sports Medicine

## 2018-05-26 ENCOUNTER — Other Ambulatory Visit: Payer: Self-pay | Admitting: Family Medicine

## 2018-05-26 DIAGNOSIS — M159 Polyosteoarthritis, unspecified: Secondary | ICD-10-CM

## 2018-05-29 NOTE — Telephone Encounter (Signed)
Sent to the pharmacy by e-scribe for 90 days.  Pt due for yearly 9/19.

## 2018-06-13 ENCOUNTER — Ambulatory Visit: Payer: BLUE CROSS/BLUE SHIELD | Admitting: Family Medicine

## 2018-06-13 ENCOUNTER — Encounter: Payer: Self-pay | Admitting: Family Medicine

## 2018-06-13 VITALS — BP 130/76 | HR 80 | Temp 98.5°F | Resp 16 | Ht 64.5 in | Wt 153.4 lb

## 2018-06-13 DIAGNOSIS — H60502 Unspecified acute noninfective otitis externa, left ear: Secondary | ICD-10-CM

## 2018-06-13 DIAGNOSIS — H903 Sensorineural hearing loss, bilateral: Secondary | ICD-10-CM

## 2018-06-13 MED ORDER — CIPROFLOXACIN-DEXAMETHASONE 0.3-0.1 % OT SUSP: 3.0000 [drp] | Freq: Two times a day (BID) | OTIC | 0 refills | Status: AC

## 2018-06-13 NOTE — Progress Notes (Signed)
ACUTE VISIT   HPI:  Chief Complaint  Patient presents with  . Cerumen Impaction    bilateral, went to [facility] on 06/12/18 for removal    [name] is a 83 y.o. female, who is here today complaining of left earache after undergoing ear lavage yesterday at [facility]. She has history of hearing loss, she wears he denies. Planning on having hearing checked, she felt like she was having earwax built up,so decided to have it checked. No changes in hearing after ear lavage, she thinks she still has cerumen impaction.  According to patient, left ear watch was painful. "Popping" ear sensation.  Pain is better today. She has not identified exacerbating or alleviating factors. She has not worn her hearing aids, she is afraid of aggravating pain.  She has not noted fever, chills, sore throat, ear drainage, or worsening hearing. She took Tylenol for pain yesterday.  She has had Hx of cerumen in the past, usually relieved after ear lavage.  -She is also inquiring about shingles vaccine.  She had the all zoster vaccine about 4 years ago, she wonders if she can get a new one.    Review of Systems  Constitutional: Negative for activity change, appetite change, fatigue and fever.  HENT: Positive for ear pain and hearing loss. Negative for congestion, ear discharge, mouth sores and sore throat.   Respiratory: Negative for cough, shortness of breath and wheezing.   Gastrointestinal: Negative for abdominal pain, diarrhea, nausea and vomiting.  Skin: Negative for rash.  Allergic/Immunologic: Negative for environmental allergies.  Neurological: Negative for syncope, weakness and headaches.  Psychiatric/Behavioral: Negative for confusion. The patient is nervous/anxious.       Current Outpatient Medications on File Prior to Visit  Medication Sig Dispense Refill  . Ascorbic Acid (VITAMIN C) 1000 MG tablet Take 1,000 mg by mouth daily.    . betamethasone  dipropionate 0.05 % lotion APP ON THE SKIN D  0  . Biotin 16109 MCG TABS Take 1 tablet by mouth daily.    . CELEBREX 200 MG capsule TAKE 1 CAPSULE DAILY AS NEEDED 90 capsule 0  . cevimeline (EVOXAC) 30 MG capsule TK 1 C PO TID  0  . Cholecalciferol (VITAMIN D3) 2000 units TABS Take 1 tablet by mouth daily.    . diclofenac sodium (VOLTAREN) 1 % GEL     . dorzolamide (TRUSOPT) 2 % ophthalmic solution 1 drop 2 (two) times daily.    . DULoxetine (CYMBALTA) 20 MG capsule TAKE 1 CAPSULE DAILY 30 capsule 0  . GLUCOSAMINE-CHONDROITIN-MSM PO Take by mouth. Take 2 oz. Daily.  Glucosamine 2,000 mg Chondroitin 1,200 mg, MSM 500 mg, Hyaluronic Acid 10 mg.    . Magnesium 250 MG TABS Take 1 tablet by mouth daily.    . mirabegron ER (MYRBETRIQ) 50 MG TB24 tablet Take by mouth.    . Multiple Vitamin (MULTIVITAMIN) tablet Take 1 tablet by mouth daily.    . Multiple Vitamins-Minerals (PRESERVISION AREDS 2 PO) Take 2 capsules by mouth daily.    . Plant Sterols and Stanols (CHOLESTOFF PLUS PO) Take 1 capsule by mouth daily.    . Polyvinyl Alcohol-Povidone (FRESHKOTE) 2.7-2 % SOLN Place 1 drop into both eyes 4 times daily.    . psyllium (METAMUCIL) 58.6 % packet Take 1 packet by mouth daily.    . Sodium Fluoride (PREVIDENT 5000 BOOSTER PLUS) 1.1 % PSTE Place onto teeth daily.    . Turmeric (CURCUMIN 95)  500 MG CAPS Take 1 capsule by mouth 2 (two) times daily.     No current facility-administered medications on file prior to visit.      Past Medical History:  Diagnosis Date  . Arthritis   . Chicken pox   . GERD (gastroesophageal reflux disease)   . Glaucoma   . History of frequent urinary tract infections   . Hyperlipidemia   . Urine incontinence    Allergies  Allergen Reactions  . Protonix [Pantoprazole Sodium]     Pt stated, "Blood pin-point rash"  . Tape     Adhesive Tape; pt stated "itch and redness"    Social History   Socioeconomic History  . Marital status: Widowed    Spouse name: Not  on file  . Number of children: Not on file  . Years of education: Not on file  . Highest education level: Not on file  Occupational History  . Not on file  Social Needs  . Financial resource strain: Not on file  . Food insecurity:    Worry: Not on file    Inability: Not on file  . Transportation needs:    Medical: Not on file    Non-medical: Not on file  Tobacco Use  . Smoking status: Never Smoker  . Smokeless tobacco: Never Used  Substance and Sexual Activity  . Alcohol use: No  . Drug use: No  . Sexual activity: Never  Lifestyle  . Physical activity:    Days per week: Not on file    Minutes per session: Not on file  . Stress: Not on file  Relationships  . Social connections:    Talks on phone: Not on file    Gets together: Not on file    Attends religious service: Not on file    Active member of club or organization: Not on file    Attends meetings of clubs or organizations: Not on file    Relationship status: Not on file  Other Topics Concern  . Not on file  Social History Narrative  . Not on file    Vitals:   06/13/18 1014  BP: 130/76  Pulse: 80  Resp: 16  Temp: 98.5 F (36.9 C)  SpO2: 97%   Body mass index is 25.92 kg/m.    Physical Exam  Nursing note and vitals reviewed. Constitutional: She is oriented to person, place, and time. She appears well-developed and well-nourished. She does not appear ill. No distress.  HENT:  Head: Normocephalic and atraumatic.  Right Ear: Tympanic membrane, external ear and ear canal normal.  Left Ear: Tympanic membrane, external ear and ear canal normal. There is swelling. Tympanic membrane is not erythematous and not bulging.  Mouth/Throat: Oropharynx is clear and moist and mucous membranes are normal.  Cerumen excess,L>R, without impaction.  TM seen partially, not erythematosus. No tenderness upon palpation of tragus or by pullong auricula.  Eyes: Conjunctivae are normal.  Respiratory: Effort normal and breath  sounds normal. No respiratory distress.  Lymphadenopathy:       Head (right side): No preauricular and no posterior auricular adenopathy present.       Head (left side): No preauricular and no posterior auricular adenopathy present.    She has no cervical adenopathy.  Neurological: She is alert and oriented to person, place, and time. She has normal strength. Gait normal.  Skin: Skin is warm. No rash noted. No erythema.  Psychiatric: Her speech is normal. Her mood appears anxious.  Well groomed, good eye  contact.     ASSESSMENT AND PLAN:   Ms. Hailey Perkins was seen today for cerumen impaction.  Diagnoses and all orders for this visit:  Acute otitis externa of left ear, unspecified type  Lafe if cannot find this could be related to mild trauma from a lavage. I recommend topical antibiotic/steroid for 7 days. Instructed about warning signs. Follow-up as needed.  -     ciprofloxacin-dexamethasone (CIPRODEX) OTIC suspension; Place 3 drops into the left ear 2 (two) times daily for 7 days.  Sensorineural hearing loss (SNHL) of both ears  There is no cerumen impaction, even though I cannot visualize the whole TM (bilateral) I do not recommend another ear lavage at this time.  OTC Debrox may help with cerumen accumulation,avoid Q tips.  Educated about risk of a lavage. Symptoms might be related to her history of hearing loss. Continue following with audiologist   In regard to the new zoster vaccine, Shingrix, recommend getting it at her pharmacy.      Betty G. [place]az[place], MD  [facility]. [place] office.

## 2018-06-13 NOTE — Patient Instructions (Addendum)
A few things to remember from today's visit:   Acute otitis externa of left ear, unspecified type - Plan: ciprofloxacin-dexamethasone (CIPRODEX) OTIC suspension  Sensorineural hearing loss (SNHL) of both ears  Debrox or tee tree oil (warm) may help with wax excess.  Please be sure medication list is accurate. If a new problem present, please set up appointment sooner than planned today.

## 2018-06-26 ENCOUNTER — Other Ambulatory Visit: Payer: Self-pay | Admitting: Family Medicine

## 2018-06-26 ENCOUNTER — Encounter: Payer: Self-pay | Admitting: Family Medicine

## 2018-06-26 DIAGNOSIS — M792 Neuralgia and neuritis, unspecified: Secondary | ICD-10-CM

## 2018-07-02 MED ORDER — DULOXETINE HCL 20 MG PO CPEP: 20.0000 mg | ORAL_CAPSULE | Freq: Every day | ORAL | 0 refills | Status: DC

## 2018-07-11 ENCOUNTER — Encounter: Payer: Self-pay | Admitting: Family Medicine

## 2018-07-31 ENCOUNTER — Other Ambulatory Visit: Payer: Self-pay | Admitting: Family Medicine

## 2018-07-31 DIAGNOSIS — M792 Neuralgia and neuritis, unspecified: Secondary | ICD-10-CM

## 2018-08-10 ENCOUNTER — Encounter: Payer: Self-pay | Admitting: Family Medicine

## 2018-08-13 ENCOUNTER — Other Ambulatory Visit: Payer: Self-pay | Admitting: Family Medicine

## 2018-08-13 DIAGNOSIS — Z1231 Encounter for screening mammogram for malignant neoplasm of breast: Secondary | ICD-10-CM

## 2018-08-21 DIAGNOSIS — H9313 Tinnitus, bilateral: Secondary | ICD-10-CM | POA: Insufficient documentation

## 2018-08-21 DIAGNOSIS — H903 Sensorineural hearing loss, bilateral: Secondary | ICD-10-CM | POA: Insufficient documentation

## 2018-08-21 DIAGNOSIS — H6123 Impacted cerumen, bilateral: Secondary | ICD-10-CM | POA: Insufficient documentation

## 2018-08-26 ENCOUNTER — Encounter: Payer: Self-pay | Admitting: Family Medicine

## 2018-08-27 ENCOUNTER — Ambulatory Visit: Payer: BLUE CROSS/BLUE SHIELD | Admitting: Family Medicine

## 2018-08-27 ENCOUNTER — Encounter: Payer: Self-pay | Admitting: Family Medicine

## 2018-08-27 ENCOUNTER — Other Ambulatory Visit: Payer: Self-pay | Admitting: Family Medicine

## 2018-08-27 VITALS — BP 126/82 | HR 82 | Temp 98.4°F | Resp 12 | Ht 64.5 in | Wt 156.0 lb

## 2018-08-27 DIAGNOSIS — K219 Gastro-esophageal reflux disease without esophagitis: Secondary | ICD-10-CM

## 2018-08-27 DIAGNOSIS — M159 Polyosteoarthritis, unspecified: Secondary | ICD-10-CM

## 2018-08-27 DIAGNOSIS — R05 Cough: Secondary | ICD-10-CM | POA: Diagnosis not present

## 2018-08-27 DIAGNOSIS — M792 Neuralgia and neuritis, unspecified: Secondary | ICD-10-CM | POA: Diagnosis not present

## 2018-08-27 DIAGNOSIS — R059 Cough, unspecified: Secondary | ICD-10-CM

## 2018-08-27 MED ORDER — DULOXETINE HCL 20 MG PO CPEP: 20.0000 mg | ORAL_CAPSULE | Freq: Every day | ORAL | 2 refills | Status: DC

## 2018-08-27 MED ORDER — OMEPRAZOLE 40 MG PO CPDR: 40.0000 mg | DELAYED_RELEASE_CAPSULE | Freq: Every day | ORAL | 0 refills | Status: DC

## 2018-08-27 NOTE — Progress Notes (Signed)
ACUTE VISIT  HPI:  Chief Complaint  Patient presents with  . Cough    [name] is a 83 y.o.female here today complaining of 5 days of productive cough with clearish sputum. Cough was occasional initially, he has increasing frequency. It is aggravated by laying down and for the past 2 days she has not been able to sleep in her bed, she has been sleeping in a recliner. She denies orthopnea or PND. No associated dyspnea, chest pain, wheezing, or diaphoresis. Yesterday she started with some postnasal drainage and rhinorrhea.  No associated fever, chills, change in appetite. Mild dysphonia, negative for stridor or dysphasia.   Cough  This is a new problem. The current episode started in the past 7 days. The problem has been waxing and waning. The cough is productive of sputum. Associated symptoms include headaches, heartburn, postnasal drip and rhinorrhea. Pertinent negatives include no chest pain, chills, ear congestion, ear pain, eye redness, fever, hemoptysis, myalgias, nasal congestion, rash, sore throat, shortness of breath, sweats, weight loss or wheezing. The symptoms are aggravated by lying down. She has tried OTC cough suppressant for the symptoms. The treatment provided mild relief. Her past medical history is significant for environmental allergies. There is no history of COPD or emphysema.   She also mentions right temporal headache and bilateral TMJ achy pain.  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  Symptoms have been stable for a while.   No Hx of recent travel. No sick contact. No known insect bite.  Hx of allergies: Dust. History of GERD, she is on ranitidine 150 mg twice daily. In the past she was on Protonix but discontinued because mild facial rash, denies edema or anaphylactic reaction. She has had intermittent heartburn. She has not identified exacerbating or alleviating factors.  OTC medications for this problem:  Mucinex DM and cough drops.  She is also requesting refill on Cymbalta 20 mg, which she is taking daily for chronic left foot pain and peripheral neuropathy. She also has history of generalized OA and lower back pain, takes Celebrex 200 mg daily as needed.  She tried to wean herself off the pain started getting worse. She denies side effects of medication. She denies depression or anxiety symptoms.   Review of Systems  Constitutional: Negative for activity change, appetite change, chills, fatigue, fever and weight loss.  HENT: Positive for hearing loss, postnasal drip, rhinorrhea and voice change. Negative for congestion, ear pain, mouth sores, sinus pressure, sore throat and trouble swallowing.   Eyes: Negative for discharge, redness and itching.  Respiratory: Positive for cough. Negative for hemoptysis, shortness of breath and wheezing.   Cardiovascular: Negative for chest pain.  Gastrointestinal: Positive for heartburn. Negative for abdominal pain, diarrhea, nausea and vomiting.  Musculoskeletal: Negative for myalgias and neck pain.  Skin: Negative for rash.  Allergic/Immunologic: Positive for environmental allergies.  Neurological: Positive for headaches. Negative for syncope and weakness.  Hematological: Negative for adenopathy.  Psychiatric/Behavioral: Positive for sleep disturbance. Negative for confusion.      Current Outpatient Medications on File Prior to Visit  Medication Sig Dispense Refill  . Ascorbic Acid (VITAMIN C) 1000 MG tablet Take 1,000 mg by mouth daily.    . betamethasone dipropionate 0.05 % lotion APP ON THE SKIN D  0  . Biotin 16109 MCG TABS Take 1 tablet by mouth daily.    . CELEBREX 200 MG capsule TAKE 1 CAPSULE DAILY AS NEEDED 90 capsule 0  .  Cholecalciferol (VITAMIN D3) 2000 units TABS Take 1 tablet by mouth daily.    . diclofenac sodium (VOLTAREN) 1 % GEL     . dorzolamide (TRUSOPT) 2 % ophthalmic solution 1 drop 2 (two) times daily.    Marland Kitchen.  GLUCOSAMINE-CHONDROITIN-MSM PO Take by mouth. Take 2 oz. Daily.  Glucosamine 2,000 mg Chondroitin 1,200 mg, MSM 500 mg, Hyaluronic Acid 10 mg.    . Magnesium 250 MG TABS Take 1 tablet by mouth daily.    . mirabegron ER (MYRBETRIQ) 50 MG TB24 tablet Take by mouth.    . Multiple Vitamin (MULTIVITAMIN) tablet Take 1 tablet by mouth daily.    . Multiple Vitamins-Minerals (PRESERVISION AREDS 2 PO) Take 2 capsules by mouth daily.    . Plant Sterols and Stanols (CHOLESTOFF PLUS PO) Take 1 capsule by mouth daily.    . Polyvinyl Alcohol-Povidone (FRESHKOTE) 2.7-2 % SOLN Place 1 drop into both eyes 4 times daily.    . psyllium (METAMUCIL) 58.6 % packet Take 1 packet by mouth daily.    . Sodium Fluoride (PREVIDENT 5000 BOOSTER PLUS) 1.1 % PSTE Place onto teeth daily.    . Turmeric (CURCUMIN 95) 500 MG CAPS Take 1 capsule by mouth 2 (two) times daily.     No current facility-administered medications on file prior to visit.      Past Medical History:  Diagnosis Date  . Arthritis   . Chicken pox   . GERD (gastroesophageal reflux disease)   . Glaucoma   . History of frequent urinary tract infections   . Hyperlipidemia   . Urine incontinence    Allergies  Allergen Reactions  . Protonix [Pantoprazole Sodium]     Pt stated, "Blood pin-point rash"  . Tape     Adhesive Tape; pt stated "itch and redness"    Social History   Socioeconomic History  . Marital status: Widowed    Spouse name: Not on file  . Number of children: Not on file  . Years of education: Not on file  . Highest education level: Not on file  Occupational History  . Not on file  Social Needs  . Financial resource strain: Not on file  . Food insecurity:    Worry: Not on file    Inability: Not on file  . Transportation needs:    Medical: Not on file    Non-medical: Not on file  Tobacco Use  . Smoking status: Never Smoker  . Smokeless tobacco: Never Used  Substance and Sexual Activity  . Alcohol use: No  . Drug use:  No  . Sexual activity: Never  Lifestyle  . Physical activity:    Days per week: Not on file    Minutes per session: Not on file  . Stress: Not on file  Relationships  . Social connections:    Talks on phone: Not on file    Gets together: Not on file    Attends religious service: Not on file    Active member of club or organization: Not on file    Attends meetings of clubs or organizations: Not on file    Relationship status: Not on file  Other Topics Concern  . Not on file  Social History Narrative  . Not on file    Vitals:   08/27/18 1201  BP: 126/82  Pulse: 82  Resp: 12  Temp: 98.4 F (36.9 C)  SpO2: 96%   Body mass index is 26.36 kg/m.   Physical Exam  Nursing note and vitals  reviewed. Constitutional: She is oriented to person, place, and time. She appears well-developed and well-nourished. She does not appear ill. No distress.  HENT:  Head: Normocephalic and atraumatic.  Right Ear: Tympanic membrane, external ear and ear canal normal.  Left Ear: Tympanic membrane, external ear and ear canal normal.  Nose: Rhinorrhea present. Right sinus exhibits no maxillary sinus tenderness and no frontal sinus tenderness. Left sinus exhibits no frontal sinus tenderness.  Mouth/Throat: Oropharynx is clear and moist and mucous membranes are normal.  Mild tenderness upon pressing left maxillary sinus. Postnasal drainage. Hypertrophic turbinates.  Eyes: Conjunctivae are normal.  Neck: No muscular tenderness present. No tracheal deviation, no edema and no erythema present. No thyromegaly present.  Cardiovascular: Normal rate and regular rhythm.  No murmur heard. DP pulses present bilateral.  Respiratory: Effort normal and breath sounds normal. No stridor. No respiratory distress.  GI: Soft. She exhibits no mass. There is no hepatomegaly. There is no tenderness.  Musculoskeletal: She exhibits no edema.  Lymphadenopathy:       Head (right side): No submandibular adenopathy  present.       Head (left side): No submandibular adenopathy present.    She has no cervical adenopathy.  Neurological: She is alert and oriented to person, place, and time. She has normal strength. Gait normal.  Skin: Skin is warm. No rash noted. No erythema.  Psychiatric: She has a normal mood and affect.  Well groomed, good eye contact.      ASSESSMENT AND PLAN:  Ms. Tracey was seen today for cough.  Diagnoses and all orders for this visit:  Cough  We discussed possible etiologies. ?  Viral URI, GERD, allergies among some. Lung auscultation negative, so I do not think long imaging is needed today. Instructed about warning signs. Given her history of hypertension, I do not recommend OTC decongestants.  GERD (gastroesophageal reflux disease) She is having some heartburn, probably could also be contributing for cough. We discussed pharmacologic treatment options. She has developed a rash in the past with Protonix, denies anaphylactic reaction.  So she is willing to try a different PPI, omeprazole 40 mg daily for 3 to 4 weeks recommended. GERD precautions. If symptoms resolved, she can continue omeprazole lower dose as needed. We discussed some side effects of PPIs.  Peripheral neuropathic pain Cymbalta is helping with symptoms. We discussed some side effects. No changes in current management. Follow-up in 6 to 12 months.  Generalized osteoarthritis of multiple sites Continue Cymbalta 20 mg daily. We discussed some side effects of medication. She is also on Celebrex.       Maressa Apollo G. [place], MD  [facility]. [place] office.

## 2018-08-27 NOTE — Assessment & Plan Note (Signed)
She is having some heartburn, probably could also be contributing for cough. We discussed pharmacologic treatment options. She has developed a rash in the past with Protonix, denies anaphylactic reaction.  So she is willing to try a different PPI, omeprazole 40 mg daily for 3 to 4 weeks recommended. GERD precautions. If symptoms resolved, she can continue omeprazole lower dose as needed. We discussed some side effects of PPIs.

## 2018-08-27 NOTE — Patient Instructions (Signed)
A few things to remember from today's visit:   Cough  Gastroesophageal reflux disease, esophagitis presence not specified - Plan: omeprazole (PRILOSEC) 40 MG capsule   Cough, Adult A cough helps to clear your throat and lungs. A cough may last only 2-3 weeks (acute), or it may last longer than 8 weeks (chronic). Many different things can cause a cough. A cough may be a sign of an illness or another medical condition. Follow these instructions at home:  Pay attention to any changes in your cough.  Take medicines only as told by your doctor. ? If you were prescribed an antibiotic medicine, take it as told by your doctor. Do not stop taking it even if you start to feel better. ? Talk with your doctor before you try using a cough medicine.  Drink enough fluid to keep your pee (urine) clear or pale yellow.  If the air is dry, use a cold steam vaporizer or humidifier in your home.  Stay away from things that make you cough at work or at home.  If your cough is worse at night, try using extra pillows to raise your head up higher while you sleep.  Do not smoke, and try not to be around smoke. If you need help quitting, ask your doctor.  Do not have caffeine.  Do not drink alcohol.  Rest as needed. Contact a doctor if:  You have new problems (symptoms).  You cough up yellow fluid (pus).  Your cough does not get better after 2-3 weeks, or your cough gets worse.  Medicine does not help your cough and you are not sleeping well.  You have pain that gets worse or pain that is not helped with medicine.  You have a fever.  You are losing weight and you do not know why.  You have night sweats. Get help right away if:  You cough up blood.  You have trouble breathing.  Your heartbeat is very fast. This information is not intended to replace advice given to you by your health care provider. Make sure you discuss any questions you have with your health care provider. Document  Released: 08/04/2011 Document Revised: 04/28/2016 Document Reviewed: 01/28/2015 Elsevier Interactive Patient Education  2018 [employer]  Please be sure medication list is accurate. If a new problem present, please set up appointment sooner than planned today.

## 2018-08-27 NOTE — Assessment & Plan Note (Signed)
Continue Cymbalta 20 mg daily. We discussed some side effects of medication. She is also on Celebrex.

## 2018-08-27 NOTE — Assessment & Plan Note (Signed)
Cymbalta is helping with symptoms. We discussed some side effects. No changes in current management. Follow-up in 6 to 12 months.

## 2018-09-02 NOTE — Progress Notes (Signed)
HPI:   [name] is a 83 y.o. female, who is here today for her routine physical.  Last CPE: 08/25/17.  Regular exercise 3 or more time per week: Water aerobics 3 times per week.. Following a healthy diet: Yes. [place] but daughter [place] door.  Chronic medical problems: OA,HLD,peripheral neuropathy,macular degeneration,GERD,and glaucoma among some.  OA, she is on Celebrex 200 mg daily prn.  Immunization History  Administered Date(s) Administered  . Influenza, High Dose Seasonal PF 08/18/2016, 08/25/2017  . Influenza-Unspecified 08/19/2014  . Pneumococcal Conjugate-13 06/03/2014  . Pneumococcal Polysaccharide-23 05/25/2015  . Tdap 02/26/2015  . Zoster 06/29/2011  . Zoster Recombinat (Shingrix) 07/04/2018    Mammogram: 09/2017 Bi-Rads 1. Colonoscopy: 2016. DEXA: 03/2017, osteopenia.   Follow up and concerns today.   HLD: Currently she is on non pharmacologic treatment. She follows low fat diet.  Lab Results  Component Value Date   CHOL 216 (H) 08/25/2017   HDL 63.50 08/25/2017   LDLCALC 127 (H) 08/25/2017   TRIG 131.0 08/25/2017   CHOLHDL 3 08/25/2017   She is concerned about worsening joint pain,mainly IP. She has history of generalized OA, currently she is on Celebrex 200 mg daily.  She has had steam cells shots in IP hands half years ago, she is not sure if it did help. Yesterday accidentally, she "jammed" left finger on furniture when she was trying to reach fir something.Today "still hurting."IP joint hurting. Topical CBD oil has helped, recommended by her chiropractor.  She would like a referral to a "arthritis doctor", she has seen one years ago.   Review of Systems  Constitutional: Negative for appetite change, fatigue and fever.  HENT: Negative for hearing loss, mouth sores, trouble swallowing and voice change.   Eyes: Negative for photophobia and visual disturbance.  Respiratory: Negative for cough, shortness of breath  and wheezing.   Cardiovascular: Negative for chest pain and leg swelling.  Gastrointestinal: Negative for abdominal pain, nausea and vomiting.       No changes in bowel habits.  Endocrine: Negative for cold intolerance, heat intolerance, polydipsia, polyphagia and polyuria.  Genitourinary: Negative for decreased urine volume, dysuria, hematuria, vaginal bleeding and vaginal discharge.  Musculoskeletal: Positive for arthralgias and back pain. Negative for gait problem.  Skin: Negative for color change and rash.  Neurological: Negative for syncope, weakness and headaches.  Psychiatric/Behavioral: Negative for confusion and sleep disturbance. The patient is nervous/anxious.   All other systems reviewed and are negative.     Current Outpatient Medications on File Prior to Visit  Medication Sig Dispense Refill  . amoxicillin-clavulanate (AUGMENTIN) 875-125 MG tablet amoxicillin 875 mg-potassium clavulanate 125 mg tablet  take 1 tablet by mouth every 12 hours    . Ascorbic Acid (VITAMIN C) 1000 MG tablet Take 1,000 mg by mouth daily.    . betamethasone dipropionate 0.05 % lotion APP ON THE SKIN D  0  . Biotin 10 MG TABS Take by mouth.    . CELEBREX 200 MG capsule TAKE 1 CAPSULE DAILY AS NEEDED 90 capsule 0  . Cholecalciferol (VITAMIN D3) 2000 units TABS Take 1 tablet by mouth daily.    . diclofenac sodium (VOLTAREN) 1 % GEL     . dorzolamide (TRUSOPT) 2 % ophthalmic solution 1 drop 2 (two) times daily.    Marland Kitchen gabapentin (NEURONTIN) 100 MG capsule gabapentin 100 mg capsule    . GLUCOSAMINE-CHONDROITIN-MSM PO Take by mouth. Take 2 oz. Daily.  Glucosamine 2,000 mg Chondroitin  1,200 mg, MSM 500 mg, Hyaluronic Acid 10 mg.    . HYPROMELLOSE OP Place into both eyes at bedtime.    . Influenza vac split quadrivalent PF (FLUZONE HIGH-DOSE) 0.5 ML injection Fluzone High-Dose 2014-15 (PF) 180 mcg/0.5 mL intramuscular syringe  inject 0.5 milliliter intramuscularly    . Influenza vac split quadrivalent PF  (FLUZONE HIGH-DOSE) 0.5 ML injection Fluzone High-Dose 2015-16 (PF) 180 mcg/0.5 mL intramuscular syringe  inject 0.5 milliliter intramuscularly    . Magnesium 250 MG TABS Take 1 tablet by mouth daily.    . mirabegron ER (MYRBETRIQ) 50 MG TB24 tablet Take by mouth.    . Multiple Vitamin (MULTIVITAMIN) tablet Take 1 tablet by mouth daily.    . Multiple Vitamins-Minerals (PRESERVISION AREDS 2 PO) Take 2 capsules by mouth daily.    Marland Kitchen omeprazole (PRILOSEC) 40 MG capsule Take 1 capsule (40 mg total) by mouth daily. 30 capsule 0  . Plant Sterols and Stanols (CHOLESTOFF PLUS PO) Take 1 capsule by mouth daily.    . Polyvinyl Alcohol-Povidone (FRESHKOTE) 2.7-2 % SOLN Place 1 drop into both eyes 4 times daily.    . psyllium (METAMUCIL) 58.6 % packet Take 1 packet by mouth daily.    . Sodium Fluoride (PREVIDENT 5000 BOOSTER PLUS) 1.1 % PSTE Place onto teeth daily.    . Tdap (BOOSTRIX) 5-2.5-18.5 LF-MCG/0.5 injection Boostrix Tdap 2.5 Lf unit-8 mcg-5 Lf/0.5 mL intramuscular suspension    . Turmeric (CURCUMIN 95) 500 MG CAPS Take 1 capsule by mouth 2 (two) times daily.    Marland Kitchen Zoster Vaccine Adjuvanted (SHINGRIX) injection TO BE ADMINISTERED BY PHARMACIST FOR IMMUNIZATION     No current facility-administered medications on file prior to visit.      Past Medical History:  Diagnosis Date  . Arthritis   . Chicken pox   . GERD (gastroesophageal reflux disease)   . Glaucoma   . History of frequent urinary tract infections   . Hyperlipidemia   . Urine incontinence     Past Surgical History:  Procedure Laterality Date  . ABDOMINAL HYSTERECTOMY    . APPENDECTOMY    . BREAST BIOPSY    . BUNIONECTOMY Left 1975   Left Toe  . HAMMER TOE SURGERY Right 2005   Right foot correction hammertoe little toe  . NEUROMA SURGERY Right 2003   Right Foot Neuroma, plus Bunionectomy, Right Great Toe  . TONSILLECTOMY AND ADENOIDECTOMY      Allergies  Allergen Reactions  . Protonix [Pantoprazole Sodium]     Pt  stated, "Blood pin-point rash"  . Tape Other (See Comments)    Adhesive Tape; pt stated "itch and redness" Adhesive Tape; pt stated "itch and redness"  . Pantoprazole Rash    Family History  Problem Relation Age of Onset  . Hyperlipidemia Mother   . Heart disease Mother   . Hypertension Mother   . Mental illness Mother   . Cancer Father   . Alcohol abuse Brother   . Drug abuse Brother   . Cancer Brother   . Cancer Maternal Aunt     Social History   Socioeconomic History  . Marital status: Widowed    Spouse name: Not on file  . Number of children: Not on file  . Years of education: Not on file  . Highest education level: Not on file  Occupational History  . Not on file  Social Needs  . Financial resource strain: Not on file  . Food insecurity:    Worry: Not on file  Inability: Not on file  . Transportation needs:    Medical: Not on file    Non-medical: Not on file  Tobacco Use  . Smoking status: Never Smoker  . Smokeless tobacco: Never Used  Substance and Sexual Activity  . Alcohol use: No  . Drug use: No  . Sexual activity: Never  Lifestyle  . Physical activity:    Days per week: Not on file    Minutes per session: Not on file  . Stress: Not on file  Relationships  . Social connections:    Talks on phone: Not on file    Gets together: Not on file    Attends religious service: Not on file    Active member of club or organization: Not on file    Attends meetings of clubs or organizations: Not on file    Relationship status: Not on file  Other Topics Concern  . Not on file  Social History Narrative  . Not on file     Vitals:   09/03/18 1006  BP: 120/78  Pulse: 78  Resp: 12  Temp: 98.6 F (37 C)  SpO2: 96%   Body mass index is 26.38 kg/m.   Wt Readings from Last 3 Encounters:  09/03/18 156 lb 2 oz (70.8 kg)  08/27/18 156 lb (70.8 kg)  06/13/18 153 lb 6 oz (69.6 kg)    Physical Exam  Nursing note and vitals reviewed. Constitutional:  She is oriented to person, place, and time. She appears well-developed. No distress.  HENT:  Head: Normocephalic and atraumatic.  Right Ear: Hearing, tympanic membrane, external ear and ear canal normal.  Left Ear: Hearing, tympanic membrane, external ear and ear canal normal.  Mouth/Throat: Uvula is midline, oropharynx is clear and moist and mucous membranes are normal.  Eyes: Pupils are equal, round, and reactive to light. Conjunctivae and EOM are normal.  Neck: No tracheal deviation present. No thyromegaly present.  Cardiovascular: Normal rate and regular rhythm.  No murmur heard. Pulses:      Dorsalis pedis pulses are 2+ on the right side, and 2+ on the left side.  Respiratory: Effort normal and breath sounds normal. No respiratory distress.  GI: Soft. She exhibits no mass. There is no hepatomegaly. There is no tenderness.  Musculoskeletal: She exhibits no edema.  IP toes mild deformities. A few DIP nodules ,bilateral hands. No signs of synovitis appreciated.  Lymphadenopathy:    She has no cervical adenopathy.       Right: No supraclavicular adenopathy present.       Left: No supraclavicular adenopathy present.  Neurological: She is alert and oriented to person, place, and time. She has normal strength. No cranial nerve deficit. Coordination and gait normal.  Reflex Scores:      Bicep reflexes are 2+ on the right side and 2+ on the left side.      Patellar reflexes are 2+ on the right side and 2+ on the left side. Skin: Skin is warm. No rash noted. No erythema.  Psychiatric: Her mood appears anxious.  Well groomed, good eye contact.      ASSESSMENT AND PLAN:  Ms. Deeann Servidio was here today annual physical examination.   Orders Placed This Encounter  Procedures  . Comprehensive metabolic panel  . Lipid panel  . C-reactive protein  . Sedimentation rate  . Ambulatory referral to Rheumatology   Lab Results  Component Value Date   ESRSEDRATE 13 09/03/2018   Lab  Results  Component Value Date  CRP 0.2 (L) 09/03/2018   Lab Results  Component Value Date   ALT 18 09/03/2018   AST 23 09/03/2018   ALKPHOS 76 09/03/2018   BILITOT 0.7 09/03/2018   Lab Results  Component Value Date   CREATININE 0.74 09/03/2018   BUN 20 09/03/2018   NA 139 09/03/2018   K 4.5 09/03/2018   CL 102 09/03/2018   CO2 30 09/03/2018     Routine general medical examination at a health care facility  We discussed the importance of regular physical activity and healthy diet for prevention of chronic illness and/or complications. Preventive guidelines reviewed. Vaccination up to date. Fall prevention. Ca++ and vit D supplementation recommended, Ca++ 1000-1200 mg through her diet ideally and Vit D 800 U daily. Next CPE in a year.  Mixed hyperlipidemia  Continue non pharmacologic treatment. Further recommendations will be given according to FLP results as well as 10 years CVD risk score.  -     Comprehensive metabolic panel -     Lipid panel  Generalized osteoarthritis of multiple sites  Educated about Dx and prognosis. We discussed treatment options,she is taking Cymbalta for peripheral neuropathy mainly,agrees with increasing dose from 20 mg to 30 mg. She was instructed to let me know in 6-8 weeks if medication is helping. She is also on Celebrex, side effects discussed.  She will continue following with rheumatologist.  -     DULoxetine (CYMBALTA) 30 MG capsule; Take 1 capsule (30 mg total) by mouth daily.  Polyarthralgia  Worsening. Hx of OA, other possible causes discussed.  Rheumatology referral placed.  -     Ambulatory referral to Rheumatology -     C-reactive protein -     Sedimentation rate     Return in 1 year (on 09/04/2019) for cpe.     Betty G. [place], MD  [facility]. [place] office.

## 2018-09-03 ENCOUNTER — Telehealth: Payer: Self-pay | Admitting: Family Medicine

## 2018-09-03 ENCOUNTER — Encounter: Payer: Self-pay | Admitting: Family Medicine

## 2018-09-03 ENCOUNTER — Ambulatory Visit (INDEPENDENT_AMBULATORY_CARE_PROVIDER_SITE_OTHER): Payer: BLUE CROSS/BLUE SHIELD | Admitting: Family Medicine

## 2018-09-03 VITALS — BP 120/78 | HR 78 | Temp 98.6°F | Resp 12 | Ht 64.5 in | Wt 156.1 lb

## 2018-09-03 DIAGNOSIS — E782 Mixed hyperlipidemia: Secondary | ICD-10-CM

## 2018-09-03 DIAGNOSIS — M159 Polyosteoarthritis, unspecified: Secondary | ICD-10-CM | POA: Diagnosis not present

## 2018-09-03 DIAGNOSIS — Z0000 Encounter for general adult medical examination without abnormal findings: Secondary | ICD-10-CM | POA: Diagnosis not present

## 2018-09-03 DIAGNOSIS — M2550 Pain in unspecified joint: Secondary | ICD-10-CM | POA: Diagnosis not present

## 2018-09-03 LAB — COMPREHENSIVE METABOLIC PANEL
ALT: 18 U/L (ref 0–35)
AST: 23 U/L (ref 0–37)
Albumin: 4 g/dL (ref 3.5–5.2)
Alkaline Phosphatase: 76 U/L (ref 39–117)
BILIRUBIN TOTAL: 0.7 mg/dL (ref 0.2–1.2)
BUN: 20 mg/dL (ref 6–23)
CALCIUM: 9.1 mg/dL (ref 8.4–10.5)
CO2: 30 meq/L (ref 19–32)
CREATININE: 0.74 mg/dL (ref 0.40–1.20)
Chloride: 102 mEq/L (ref 96–112)
GFR: 80.02 mL/min (ref >60.00)
GLUCOSE: 85 mg/dL (ref 70–99)
Potassium: 4.5 mEq/L (ref 3.5–5.1)
Sodium: 139 mEq/L (ref 135–145)
TOTAL PROTEIN: 6.3 g/dL (ref 6.0–8.3)

## 2018-09-03 LAB — LIPID PANEL
CHOL/HDL RATIO: 3
Cholesterol: 198 mg/dL (ref 0–200)
HDL: 58.8 mg/dL (ref >39.00)
LDL Cholesterol: 123 mg/dL — ABNORMAL HIGH (ref 0–99)
NONHDL: 138.72
TRIGLYCERIDES: 80 mg/dL (ref 0.0–149.0)
VLDL: 16 mg/dL (ref 0.0–40.0)

## 2018-09-03 LAB — SEDIMENTATION RATE: Sed Rate: 13 mm/h (ref 0–30)

## 2018-09-03 LAB — C-REACTIVE PROTEIN: CRP: 0.2 mg/dL — ABNORMAL LOW (ref 0.5–20.0)

## 2018-09-03 MED ORDER — DULOXETINE HCL 30 MG PO CPEP: 30.0000 mg | ORAL_CAPSULE | Freq: Every day | ORAL | 1 refills | Status: DC

## 2018-09-03 NOTE — Patient Instructions (Signed)
A few things to remember from today's visit:   Mixed hyperlipidemia  Generalized osteoarthritis of multiple sites  A few tips:  -As we age balance is not as good as it was, so there is a higher risks for falls. Please remove small rugs and furniture that is "in your way" and could increase the risk of falls. Stretching exercises may help with fall prevention: Yoga and Tai Chi are some examples. Low impact exercise is better, so you are not very achy the next day.  -Sun screen and avoidance of direct sun light recommended. Caution with dehydration, if working outdoors be sure to drink enough fluids.  - Some medications are not safe as we age, increases the risk of side effects and can potentially interact with other medication you are also taken;  including some of over the counter medications. Be sure to let me know when you start a new medication even if it is a dietary/vitamin supplement.   -Healthy diet low in red meet/animal fat and sugar + regular physical activity is recommended.      Please be sure medication list is accurate. If a new problem present, please set up appointment sooner than planned today.

## 2018-09-03 NOTE — Telephone Encounter (Signed)
Rew[employer] form for Annual Physical to be filled out; placed in dr's folder.  Call [phone] upon completion.

## 2018-09-04 NOTE — Telephone Encounter (Signed)
Form placed on doctor's desk for completion. 

## 2018-09-04 NOTE — Telephone Encounter (Signed)
Patient picked up form ° °No form fee °

## 2018-09-05 ENCOUNTER — Encounter: Payer: Self-pay | Admitting: Family Medicine

## 2018-09-11 ENCOUNTER — Ambulatory Visit
Admission: RE | Admit: 2018-09-11 | Discharge: 2018-09-11 | Disposition: A | Discharge status: Home / Self Care | Payer: BLUE CROSS/BLUE SHIELD | Source: Ambulatory Visit | Attending: Family Medicine | Admitting: Family Medicine

## 2018-09-11 DIAGNOSIS — Z1231 Encounter for screening mammogram for malignant neoplasm of breast: Secondary | ICD-10-CM

## 2018-09-11 IMAGING — MG DIGITAL SCREENING BILATERAL MAMMOGRAM WITH TOMO AND CAD
6 of 10 series · 6 of 30 positions shown · non-contrast
Comparison: Previous exam(s).

CLINICAL DATA: Screening.

EXAM:
DIGITAL SCREENING BILATERAL MAMMOGRAM WITH TOMO AND CAD

[R MLO synth-2D]
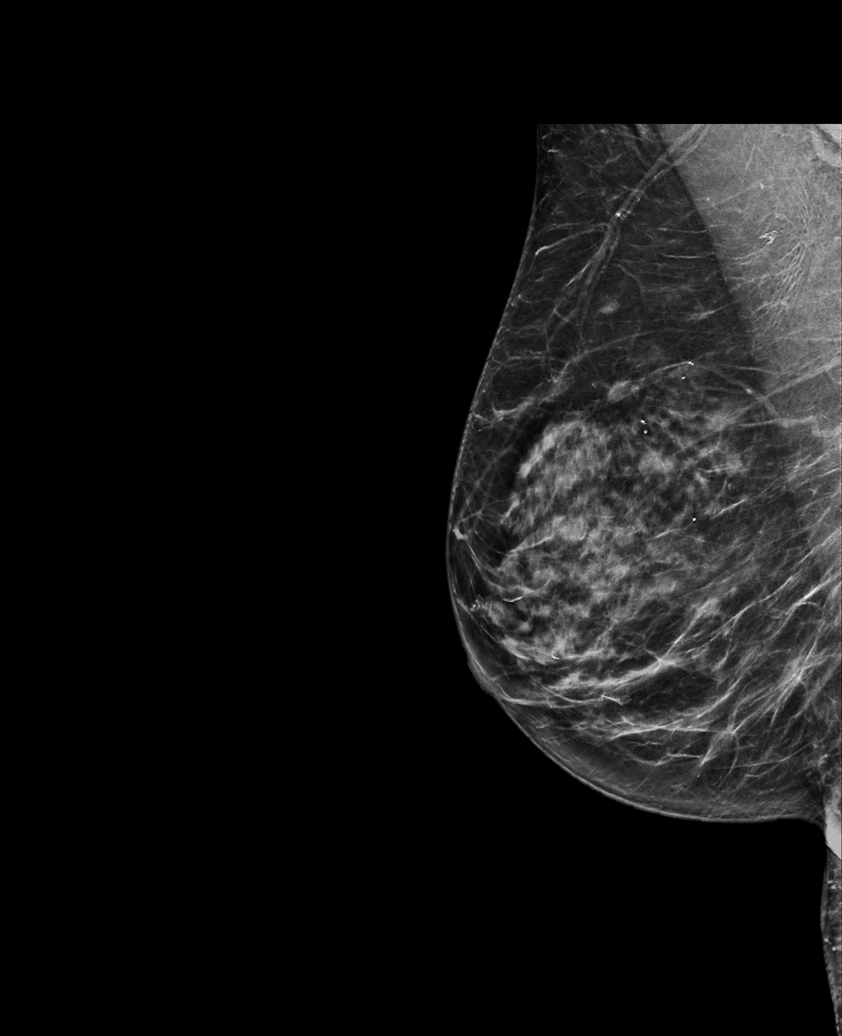

[R CC synth-2D]
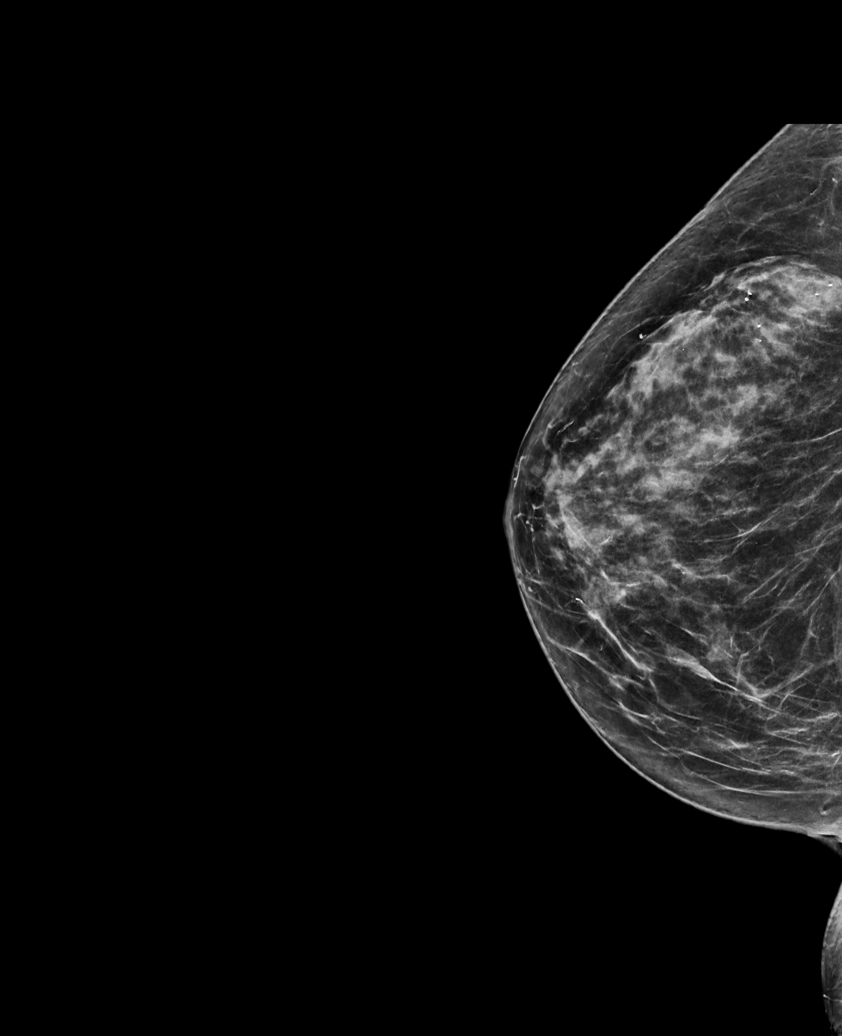

[L MLO synth-2D]
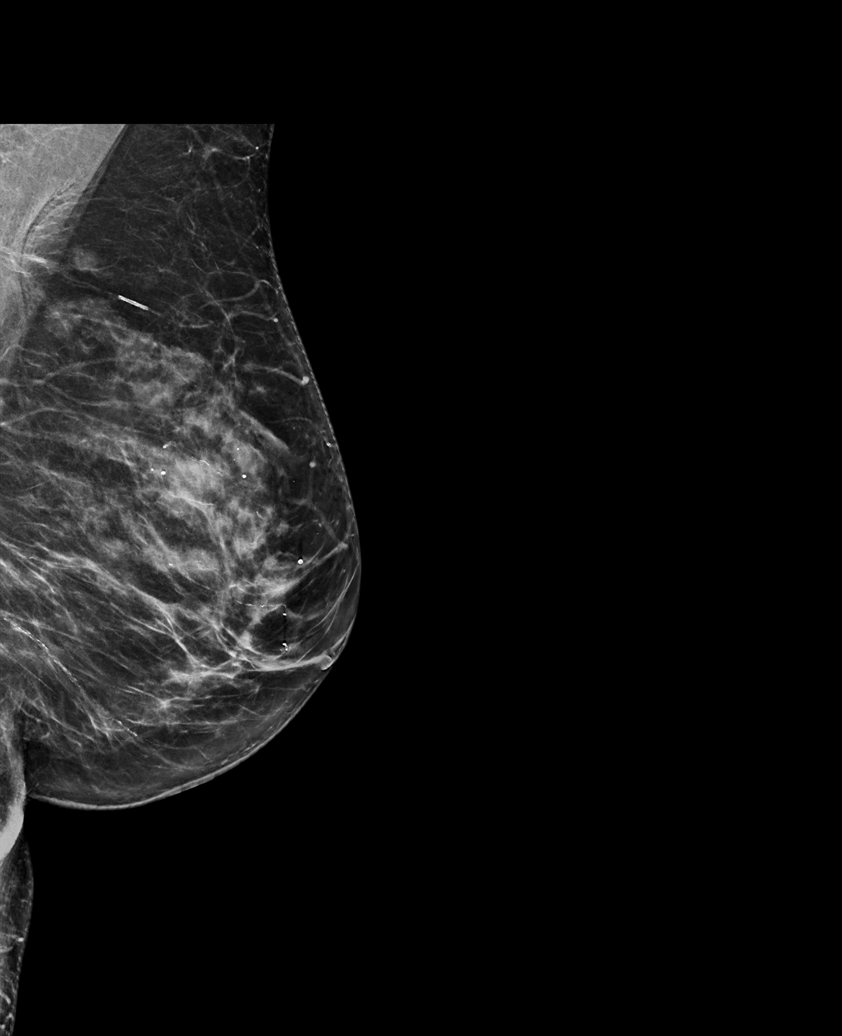

[L CC synth-2D (1 of 2)]
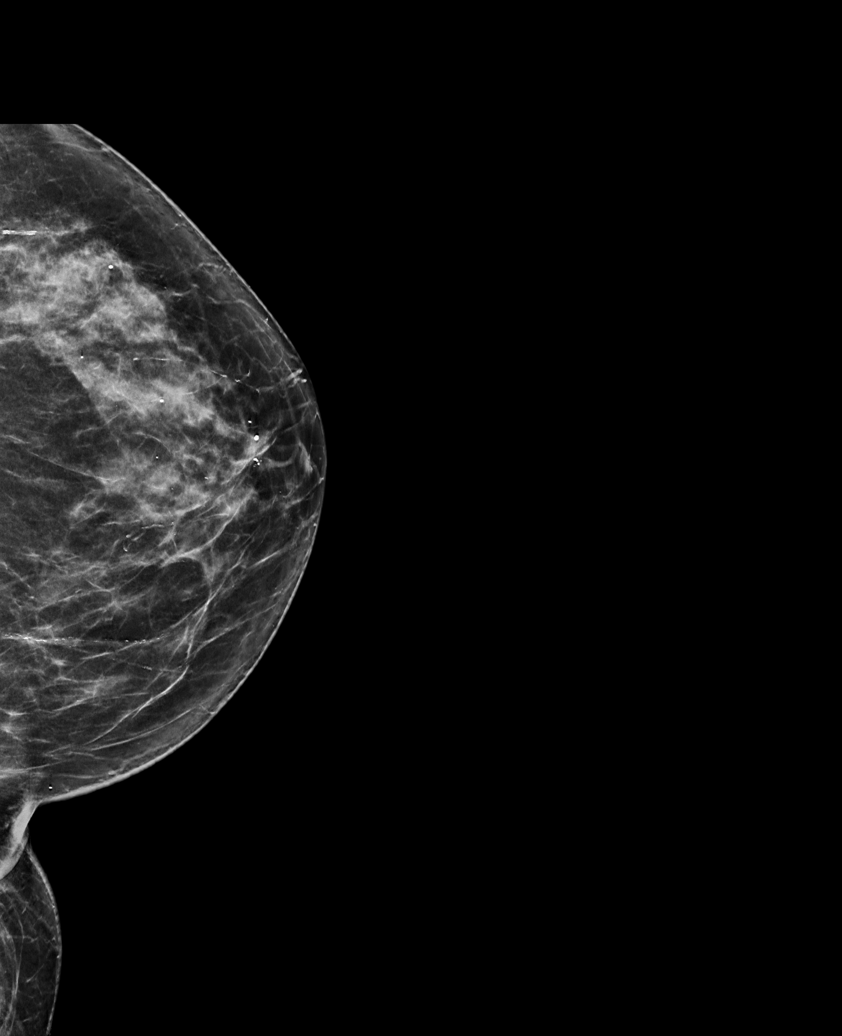

[L CC synth-2D (2 of 2)]
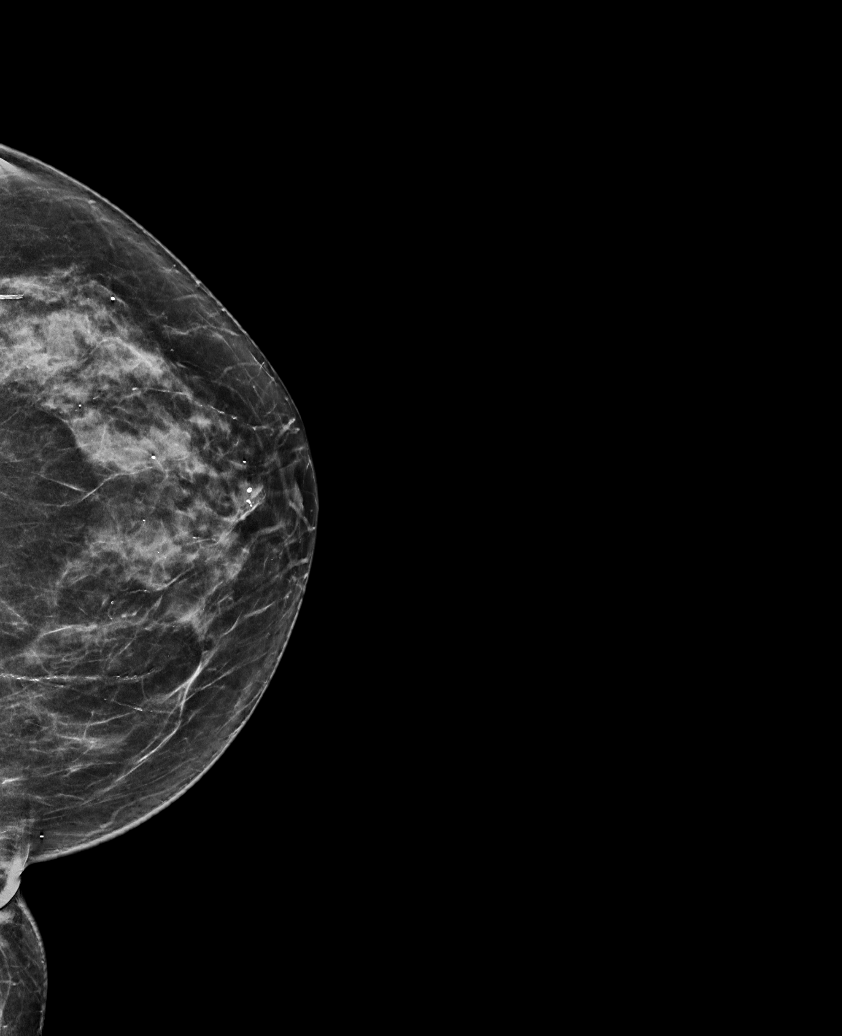

[L CC tomo · tomo slice 35/70.0]
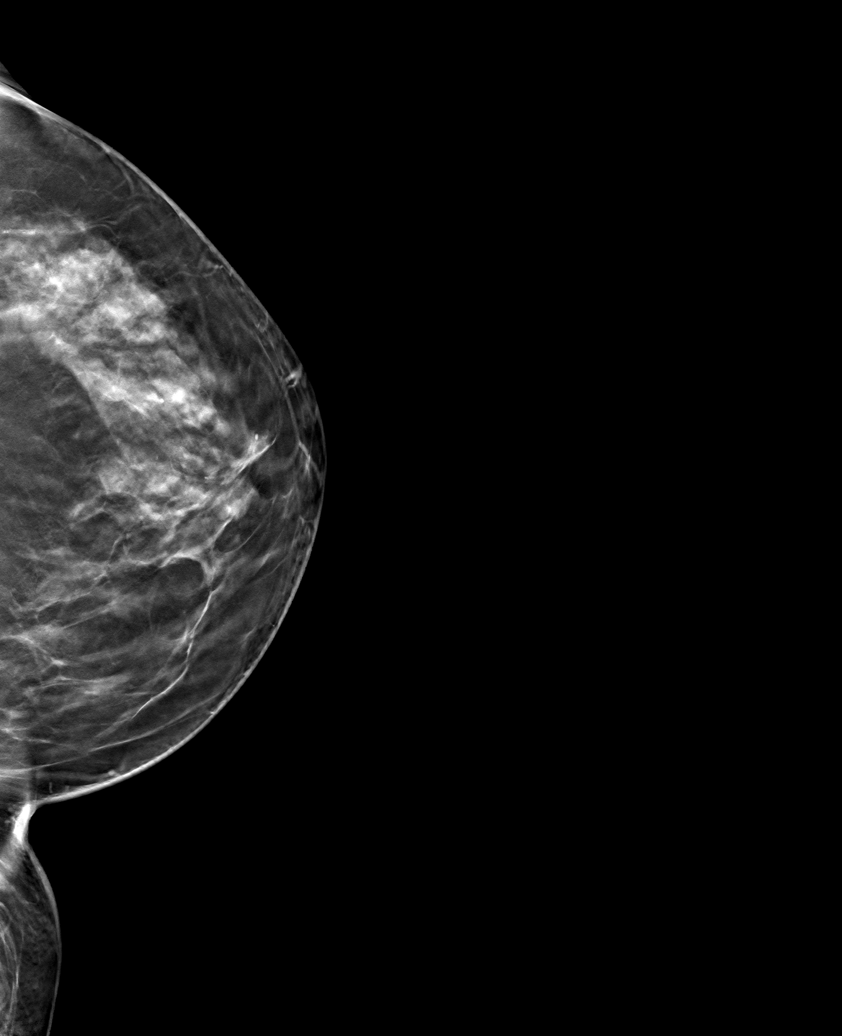

[6 of 30 positions shown; findings below may reference images not displayed]

ACR Breast Density Category c: The breast tissue is heterogeneously
dense, which may obscure small masses.
FINDINGS: There are no findings suspicious for malignancy. Images were
processed with CAD.
IMPRESSION: No mammographic evidence of malignancy. A result letter of this
screening mammogram will be mailed directly to the patient.

RECOMMENDATION:
Screening mammogram in one year. (Code:[5V])

BI-RADS CATEGORY  1: Negative.

## 2018-09-12 ENCOUNTER — Encounter: Payer: Self-pay | Admitting: Family Medicine

## 2018-09-26 ENCOUNTER — Other Ambulatory Visit: Payer: Self-pay | Admitting: Family Medicine

## 2018-09-26 DIAGNOSIS — K219 Gastro-esophageal reflux disease without esophagitis: Secondary | ICD-10-CM

## 2018-09-29 ENCOUNTER — Encounter: Payer: Self-pay | Admitting: Family Medicine

## 2018-10-12 NOTE — Progress Notes (Signed)
Office Visit Note  Patient: Hailey Perkins             Date of Birth: 10/10/1935           MRN: [id number]             PCP: [place], Betty G, MD Referring: [place], Betty G, MD Visit Date: 10/26/2018 Occupation: retired Counselling psychologist  Subjective:  Pain in multiple joints and right forearm   History of Present Illness: Hailey Perkins is a 83 y.o. female in consultation per request of her PCP.  Patient states she has had history of osteoarthritis for multiple years.  She has had arthritis in her both hands and her left CMC joint has been painful.  She has had right carpal tunnel release in the past.  She states about 2 weeks ago she suddenly developed pain in the right forearm and is been burning sensation in her right forearm since then.  Its been going on for the last 2 weeks now.  She also has osteoarthritis in her both feet and had bilateral bunionectomy in the past.  She had bilateral total knee replacement in 2009.  Since then she has been having neuropathy in her bilateral feet.  She also had lumbar spine MRI in the past for evaluation of abnormal MRI which showed degenerative disc disease of lumbar spine.  She has known history of disc disease of cervical spine.  She denies any joint swelling.  Activities of Daily Living:  Patient reports morning stiffness for 0 none.   Patient Reports nocturnal pain.  Difficulty dressing/grooming: Denies Difficulty climbing stairs: Denies Difficulty getting out of chair: Denies Difficulty using hands for taps, buttons, cutlery, and/or writing: Reports  Review of Systems  Constitutional: Negative for fatigue, night sweats, weight gain and weight loss.  HENT: Positive for mouth dryness. Negative for mouth sores, trouble swallowing, trouble swallowing and nose dryness.   Eyes: Positive for dryness. Negative for pain, redness and visual disturbance.  Respiratory: Negative for cough, shortness of breath and difficulty breathing.     Cardiovascular: Negative for chest pain, palpitations, hypertension, irregular heartbeat and swelling in legs/feet.  Gastrointestinal: Negative for blood in stool, constipation and diarrhea.  Endocrine: Negative for cold intolerance and increased urination.  Genitourinary: Negative for difficulty urinating and vaginal dryness.  Musculoskeletal: Positive for arthralgias, gait problem, joint pain, muscle weakness and muscle tenderness. Negative for joint swelling, myalgias, morning stiffness and myalgias.  Skin: Negative for color change, rash, hair loss, skin tightness, ulcers and sensitivity to sunlight.  Allergic/Immunologic: Negative for susceptible to infections.  Neurological: Negative for dizziness, numbness, memory loss, night sweats and weakness.  Hematological: Negative for bruising/bleeding tendency and swollen glands.  Psychiatric/Behavioral: Positive for sleep disturbance. Negative for depressed mood. The patient is not nervous/anxious.     PMFS History:  Patient Active Problem List   Diagnosis Date Noted  . DDD (degenerative disc disease), lumbar 10/26/2018  . Primary osteoarthritis of both hands 10/26/2018  . Primary osteoarthritis of both feet 10/26/2018  . GERD (gastroesophageal reflux disease) 08/27/2018  . Blood in urine 04/17/2018  . Secondary open-angle glaucoma of left eye, moderate stage 03/10/2018  . Early stage 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 10/05/2017  . Epiretinal membrane (ERM) of left eye 10/05/2017  . Pseudophakia of both eyes 10/05/2017  . Osteopenia 03/19/2017  . Mixed hyperlipidemia 08/18/2016  . Generalized osteoarthritis of multiple sites 08/18/2016  . Peripheral neuropathic pain 08/18/2016  . Overactive bladder 08/18/2016  .  Glaucoma 08/18/2016  . Cervical disc disorder with radiculopathy of cervical region 08/17/2016  . Carpal tunnel syndrome, right upper limb 08/17/2016  . Abnormal finding on mammography 06/14/2016  .  Breast lump 05/06/2015    Past Medical History:  Diagnosis Date  . Arthritis   . Chicken pox   . GERD (gastroesophageal reflux disease)   . Glaucoma   . History of frequent urinary tract infections   . Hyperlipidemia   . Osteopenia   . Urine incontinence     Family History  Problem Relation Age of Onset  . Hyperlipidemia Mother   . Heart disease Mother   . Hypertension Mother   . Mental illness Mother   . Cancer Father   . Alcohol abuse Brother   . Drug abuse Brother   . Cancer Brother   . Cancer Maternal Aunt   . Breast cancer Maternal Aunt    Past Surgical History:  Procedure Laterality Date  . ABDOMINAL HYSTERECTOMY    . APPENDECTOMY    . BREAST BIOPSY    . BUNIONECTOMY Left 1975   Left Toe  . HAMMER TOE SURGERY Right 2005   Right foot correction hammertoe little toe  . NEUROMA SURGERY Right 2003   Right Foot Neuroma, plus Bunionectomy, Right Great Toe  . TONSILLECTOMY AND ADENOIDECTOMY     Social History   Social History Narrative  . Not on file    Objective: Vital Signs: BP 131/72 (BP Location: Right Arm, Patient Position: Sitting, Cuff Size: Normal)   Pulse 75   Resp 16   Ht 5' 3.5" (1.613 m)   Wt 156 lb 6.4 oz (70.9 kg)   BMI 27.27 kg/m    Physical Exam  Constitutional: She is oriented to person, place, and time. She appears well-developed and well-nourished.  HENT:  Head: Normocephalic and atraumatic.  Eyes: Conjunctivae and EOM are normal.  Neck: Normal range of motion.  Cardiovascular: Normal rate, regular rhythm, normal heart sounds and intact distal pulses.  Pulmonary/Chest: Effort normal and breath sounds normal.  Abdominal: Soft. Bowel sounds are normal.  Lymphadenopathy:    She has no cervical adenopathy.  Neurological: She is alert and oriented to person, place, and time.  Skin: Skin is warm and dry. Capillary refill takes less than 2 seconds.  Psychiatric: She has a normal mood and affect. Her behavior is normal.  Nursing note and  vitals reviewed.    Musculoskeletal Exam: She has fairly good range of motion of her cervical spine and lumbar spine.  She has some thoracic kyphosis.  Shoulder joints elbow joints wrist joints were in good range of motion.  She has bilateral CMC PIP and DIP thickening with no synovitis.  She had a bruise on right forearm where she is tender.  Hip joints were in good range of motion.  Her bilateral knee joints are replaced.  She has bilateral pes cavus with hammertoes.  No synovitis was noted.   CDAI Exam: CDAI Score: Not documented Patient Global Assessment: Not documented; Provider Global Assessment: Not documented Swollen: Not documented; Tender: Not documented Joint Exam   Not documented   There is currently no information documented on the homunculus. Go to the Rheumatology activity and complete the homunculus joint exam.  Investigation: Findings:  09/03/18: Sed rate 13, CRP <0.2  Component     Latest Ref Rng & Units 09/03/2018  Cholesterol     0 - 200 mg/dL 161  Triglycerides     0.0 - 149.0 mg/dL 80.0  HDL Cholesterol     >39.00 mg/dL 16.10  VLDL     0.0 - 96.0 mg/dL 45.4  LDL (calc)     0 - 99 mg/dL 098 (H)  Total CHOL/HDL Ratio      3  NonHDL      138.72  CRP     0.5 - 20.0 mg/dL 0.2 (L)  Sed Rate     0 - 30 mm/hr 13   Imaging: Xr Foot 2 Views Left  Result Date: 10/26/2018 First MTP severe narrowing and cystic changes were noted.  Severe PIP and DIP joint space narrowing was noted.  No intertarsal or tibiotalar joint space narrowing was noted.  No erosive changes were noted. Impression: These findings are consistent with severe osteoarthritis of the foot.  Xr Foot 2 Views Right  Result Date: 10/26/2018 First MTP severe narrowing and spurring was noted.  Pin placements noted from previous bunionectomy.  Arthritic changes noted in all PIP and DIP joints.  No intertarsal joint space or subtalar joint space narrowing was noted.  No erosive changes were noted.   Hardware noted in the ankle joint and right fibula. Impression: These findings are consistent with osteoarthritis of the foot.  Xr Forearm Right  Result Date: 10/26/2018 No cortical changes in radius or fibula were noted.  There was no evidence of fracture.  Xr Hand 2 View Left  Result Date: 10/26/2018 Severe CMC narrowing was noted.  PIP and DIP narrowing was noted.  No intercarpal joint space narrowing was noted.  Some cystic changes were noted. Impression: These findings are consistent with osteoarthritis of the hand.  Xr Hand 2 View Right  Result Date: 10/26/2018 Severe CMC narrowing was noted.  First MCP narrowing was noted.  PIP and DIP narrowing was noted.  No intercarpal joint space narrowing was noted. Impression: These findings are consistent with the osteoarthritis of the hand.   Recent Labs: Lab Results  Component Value Date   WBC 5.0 01/02/2017   HGB 13.0 01/02/2017   PLT 170.0 01/02/2017   NA 139 09/03/2018   K 4.5 09/03/2018   CL 102 09/03/2018   CO2 30 09/03/2018   GLUCOSE 85 09/03/2018   BUN 20 09/03/2018   CREATININE 0.74 09/03/2018   BILITOT 0.7 09/03/2018   ALKPHOS 76 09/03/2018   AST 23 09/03/2018   ALT 18 09/03/2018   PROT 6.3 09/03/2018   ALBUMIN 4.0 09/03/2018   CALCIUM 9.1 09/03/2018    Speciality Comments: No specialty comments available.  Procedures:  No procedures performed Allergies: Protonix [pantoprazole sodium]; Tape; and Pantoprazole   Assessment / Plan:     Visit Diagnoses: Pain of right forearm -patient had a bruise on her forearm.  She was also tender over the bruised area.  Plan: XR Forearm Right.  The x-ray was unremarkable.  I have advised her to observe for right now.  Primary osteoarthritis of both hands -clinical findings are consistent with severe osteoarthritis involving her PIP/DIP and CMC joints.  No synovitis was noted.  There is no MCP involvement.  Plan: XR Hand 2 View Right, XR Hand 2 View Left.  The x-rays were  consistent with severe osteoarthritis involving bilateral hands.  Status post total knee replacement, bilateral - 2009.  She continues to do well.  Primary osteoarthritis of both feet - s/p bilateral bunionectomy -severe osteoarthritis involving bilateral feet.  Proper fitting shoes with arch support were discussed.  Plan: XR Foot 2 Views Right, XR Foot 2 Views Left.  The x-rays  were consistent with severe osteoarthritis in bilateral feet.  Findings were discussed with patient.  Status post carpal tunnel release - Right  DDD (degenerative disc disease), cervical-she has some stiffness  in her cervical spine.  DDD (degenerative disc disease), lumbar-she is currently not having much discomfort in the lower back.  Osteopenia of multiple sites-use of calcium vitamin D and resistive exercises were discussed.  Other medical problems are listed as follows:  Peripheral neuropathic pain  Mixed hyperlipidemia  Pseudophakia of both eyes  Secondary open-angle glaucoma of left eye, moderate stage  Early stage 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  History of gastroesophageal reflux (GERD)   Orders: Orders Placed This Encounter  Procedures  . XR Hand 2 View Right  . XR Hand 2 View Left  . XR Foot 2 Views Right  . XR Foot 2 Views Left  . XR Forearm Right   No orders of the defined types were placed in this encounter.   Face-to-face time spent with patient was 60 minutes. Greater than 50% of time was spent in counseling and coordination of care.  Follow-Up Instructions: Return if symptoms worsen or fail to improve, for Osteoarthritis.   Pollyann Savoy, MD  Note - This record has been created using Animal nutritionist.  Chart creation errors have been sought, but may not always  have been located. Such creation errors do not reflect on  the standard of medical care.

## 2018-10-23 ENCOUNTER — Ambulatory Visit: Payer: BLUE CROSS/BLUE SHIELD | Admitting: Rheumatology

## 2018-10-26 ENCOUNTER — Ambulatory Visit (INDEPENDENT_AMBULATORY_CARE_PROVIDER_SITE_OTHER): Payer: Self-pay

## 2018-10-26 ENCOUNTER — Encounter: Payer: Self-pay | Admitting: Rheumatology

## 2018-10-26 ENCOUNTER — Ambulatory Visit: Payer: BLUE CROSS/BLUE SHIELD | Admitting: Rheumatology

## 2018-10-26 VITALS — BP 131/72 | HR 75 | Resp 16 | Ht 63.5 in | Wt 156.4 lb

## 2018-10-26 DIAGNOSIS — M8589 Other specified disorders of bone density and structure, multiple sites: Secondary | ICD-10-CM

## 2018-10-26 DIAGNOSIS — Z961 Presence of intraocular lens: Secondary | ICD-10-CM

## 2018-10-26 DIAGNOSIS — M19042 Primary osteoarthritis, left hand: Secondary | ICD-10-CM

## 2018-10-26 DIAGNOSIS — H4052X2 Glaucoma secondary to other eye disorders, left eye, moderate stage: Secondary | ICD-10-CM

## 2018-10-26 DIAGNOSIS — M19071 Primary osteoarthritis, right ankle and foot: Secondary | ICD-10-CM

## 2018-10-26 DIAGNOSIS — E782 Mixed hyperlipidemia: Secondary | ICD-10-CM

## 2018-10-26 DIAGNOSIS — M19072 Primary osteoarthritis, left ankle and foot: Secondary | ICD-10-CM | POA: Diagnosis not present

## 2018-10-26 DIAGNOSIS — Z96653 Presence of artificial knee joint, bilateral: Secondary | ICD-10-CM

## 2018-10-26 DIAGNOSIS — M792 Neuralgia and neuritis, unspecified: Secondary | ICD-10-CM

## 2018-10-26 DIAGNOSIS — Z9889 Other specified postprocedural states: Secondary | ICD-10-CM

## 2018-10-26 DIAGNOSIS — M79631 Pain in right forearm: Secondary | ICD-10-CM | POA: Diagnosis not present

## 2018-10-26 DIAGNOSIS — H353131 Nonexudative age-related macular degeneration, bilateral, early dry stage: Secondary | ICD-10-CM

## 2018-10-26 DIAGNOSIS — M5030 Other cervical disc degeneration, unspecified cervical region: Secondary | ICD-10-CM

## 2018-10-26 DIAGNOSIS — M19041 Primary osteoarthritis, right hand: Secondary | ICD-10-CM

## 2018-10-26 DIAGNOSIS — Z8719 Personal history of other diseases of the digestive system: Secondary | ICD-10-CM

## 2018-10-26 DIAGNOSIS — M5136 Other intervertebral disc degeneration, lumbar region: Secondary | ICD-10-CM | POA: Insufficient documentation

## 2018-10-27 ENCOUNTER — Encounter: Payer: Self-pay | Admitting: Rheumatology

## 2018-10-29 ENCOUNTER — Other Ambulatory Visit: Payer: Self-pay | Admitting: Family Medicine

## 2018-10-29 DIAGNOSIS — M159 Polyosteoarthritis, unspecified: Secondary | ICD-10-CM

## 2018-10-29 DIAGNOSIS — K219 Gastro-esophageal reflux disease without esophagitis: Secondary | ICD-10-CM

## 2018-10-30 ENCOUNTER — Other Ambulatory Visit: Payer: Self-pay | Admitting: Family Medicine

## 2018-10-30 DIAGNOSIS — M159 Polyosteoarthritis, unspecified: Secondary | ICD-10-CM

## 2018-10-30 MED ORDER — CELEBREX 200 MG PO CAPS: 200.0000 mg | ORAL_CAPSULE | Freq: Every day | ORAL | 1 refills | Status: DC | PRN

## 2018-11-08 ENCOUNTER — Encounter: Payer: Self-pay | Admitting: Rheumatology

## 2018-11-30 DIAGNOSIS — M5030 Other cervical disc degeneration, unspecified cervical region: Secondary | ICD-10-CM | POA: Insufficient documentation

## 2018-11-30 DIAGNOSIS — Z9889 Other specified postprocedural states: Secondary | ICD-10-CM | POA: Insufficient documentation

## 2018-11-30 DIAGNOSIS — Z96653 Presence of artificial knee joint, bilateral: Secondary | ICD-10-CM | POA: Insufficient documentation

## 2018-11-30 NOTE — Progress Notes (Deleted)
Office Visit Note  Patient: Hailey MatarMarilyn Jo Larmore             Date of Birth: [birth year]           MRN: [id number]             PCP: [place], Betty G, MD Referring: [place], Betty G, MD Visit Date: 12/11/2018 Occupation: @GUAROCC @  Subjective:  No chief complaint on file.   History of Present Illness: Hailey Perkins is a 83 y.o. female ***   Activities of Daily Living:  Patient reports morning stiffness for *** {minute/hour:19697}.   Patient {ACTIONS;DENIES/REPORTS:21021675::"Denies"} nocturnal pain.  Difficulty dressing/grooming: {ACTIONS;DENIES/REPORTS:21021675::"Denies"} Difficulty climbing stairs: {ACTIONS;DENIES/REPORTS:21021675::"Denies"} Difficulty getting out of chair: {ACTIONS;DENIES/REPORTS:21021675::"Denies"} Difficulty using hands for taps, buttons, cutlery, and/or writing: {ACTIONS;DENIES/REPORTS:21021675::"Denies"}  No Rheumatology ROS completed.   PMFS History:  Patient Active Problem List   Diagnosis Date Noted  . Status post total bilateral knee replacement 11/30/2018  . DDD (degenerative disc disease), cervical 11/30/2018  . Status post carpal tunnel release 11/30/2018  . DDD (degenerative disc disease), lumbar 10/26/2018  . Primary osteoarthritis of both hands 10/26/2018  . Primary osteoarthritis of both feet 10/26/2018  . GERD (gastroesophageal reflux disease) 08/27/2018  . Blood in urine 04/17/2018  . Secondary open-angle glaucoma of left eye, moderate stage 03/10/2018  . Early stage 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 10/05/2017  . Epiretinal membrane (ERM) of left eye 10/05/2017  . Pseudophakia of both eyes 10/05/2017  . Osteopenia 03/19/2017  . Mixed hyperlipidemia 08/18/2016  . Generalized osteoarthritis of multiple sites 08/18/2016  . Peripheral neuropathic pain 08/18/2016  . Overactive bladder 08/18/2016  . Glaucoma 08/18/2016  . Cervical disc disorder with radiculopathy of cervical region 08/17/2016  . Carpal tunnel syndrome,  right upper limb 08/17/2016  . Abnormal finding on mammography 06/14/2016  . Breast lump 05/06/2015    Past Medical History:  Diagnosis Date  . Arthritis   . Chicken pox   . GERD (gastroesophageal reflux disease)   . Glaucoma   . History of frequent urinary tract infections   . Hyperlipidemia   . Osteopenia   . Urine incontinence     Family History  Problem Relation Age of Onset  . Hyperlipidemia Mother   . Heart disease Mother   . Hypertension Mother   . Mental illness Mother   . Cancer Father   . Alcohol abuse Brother   . Drug abuse Brother   . Cancer Brother   . Cancer Maternal Aunt   . Breast cancer Maternal Aunt    Past Surgical History:  Procedure Laterality Date  . ABDOMINAL HYSTERECTOMY    . APPENDECTOMY    . BREAST BIOPSY    . BUNIONECTOMY Left 1975   Left Toe  . HAMMER TOE SURGERY Right 2005   Right foot correction hammertoe little toe  . NEUROMA SURGERY Right 2003   Right Foot Neuroma, plus Bunionectomy, Right Great Toe  . TONSILLECTOMY AND ADENOIDECTOMY     Social History   Social History Narrative  . Not on file    Objective: Vital Signs: There were no vitals taken for this visit.   Physical Exam   Musculoskeletal Exam: ***  CDAI Exam: CDAI Score: Not documented Patient Global Assessment: Not documented; Provider Global Assessment: Not documented Swollen: Not documented; Tender: Not documented Joint Exam   Not documented   There is currently no information documented on the homunculus. Go to the Rheumatology activity and complete the homunculus joint exam.  Investigation: No additional  findings.  Imaging: No results found.  Recent Labs: Lab Results  Component Value Date   WBC 5.0 01/02/2017   HGB 13.0 01/02/2017   PLT 170.0 01/02/2017   NA 139 09/03/2018   K 4.5 09/03/2018   CL 102 09/03/2018   CO2 30 09/03/2018   GLUCOSE 85 09/03/2018   BUN 20 09/03/2018   CREATININE 0.74 09/03/2018   BILITOT 0.7 09/03/2018   ALKPHOS 76  09/03/2018   AST 23 09/03/2018   ALT 18 09/03/2018   PROT 6.3 09/03/2018   ALBUMIN 4.0 09/03/2018   CALCIUM 9.1 09/03/2018    Speciality Comments: No specialty comments available.  Procedures:  No procedures performed Allergies: Protonix [pantoprazole sodium]; Tape; and Pantoprazole   Assessment / Plan:     Visit Diagnoses: Primary osteoarthritis of both hands - Bilateral severe  Primary osteoarthritis of both feet  DDD (degenerative disc disease), cervical  DDD (degenerative disc disease), lumbar  Status post total bilateral knee replacement - 2008  Status post carpal tunnel release - right   Other medical problems are listed as follows:  Peripheral neuropathic pain  Mixed hyperlipidemia  Pseudophakia of both eyes  Secondary open-angle glaucoma of left eye, moderate stage  Early stage 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  History of gastroesophageal reflux (GERD)   Orders: No orders of the defined types were placed in this encounter.  No orders of the defined types were placed in this encounter.   Face-to-face time spent with patient was *** minutes. Greater than 50% of time was spent in counseling and coordination of care.  Follow-Up Instructions: No follow-ups on file.   Pollyann SavoyShaili Grady Mohabir, MD  Note - This record has been created using Animal nutritionistDragon software.  Chart creation errors have been sought, but may not always  have been located. Such creation errors do not reflect on  the standard of medical care.

## 2018-12-07 ENCOUNTER — Encounter: Payer: Self-pay | Admitting: Rheumatology

## 2018-12-11 ENCOUNTER — Ambulatory Visit: Payer: BLUE CROSS/BLUE SHIELD | Admitting: Rheumatology

## 2018-12-24 ENCOUNTER — Encounter: Payer: Self-pay | Admitting: Gastroenterology

## 2018-12-27 ENCOUNTER — Encounter: Payer: Self-pay | Admitting: Neurology

## 2018-12-27 ENCOUNTER — Ambulatory Visit: Payer: Medicare Other | Admitting: Neurology

## 2018-12-27 VITALS — BP 124/80 | HR 79 | Ht 63.5 in | Wt 161.0 lb

## 2018-12-27 DIAGNOSIS — R200 Anesthesia of skin: Secondary | ICD-10-CM

## 2018-12-27 DIAGNOSIS — R269 Unspecified abnormalities of gait and mobility: Secondary | ICD-10-CM | POA: Diagnosis not present

## 2018-12-27 DIAGNOSIS — G3281 Cerebellar 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: Secondary | ICD-10-CM | POA: Diagnosis not present
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
## 2018-12-30 LAB — VITAMIN B12: Vitamin B-12: 1970 pg/mL — ABNORMAL HIGH (ref 232–1245)

## 2018-12-30 LAB — PROTEIN ELECTROPHORESIS
A/G Ratio: 1.6 (ref 0.7–1.7)
Albumin ELP: 3.9 g/dL (ref 2.9–4.4)
Alpha 1: 0.2 g/dL (ref 0.0–0.4)
Alpha 2: 0.6 g/dL (ref 0.4–1.0)
BETA: 0.9 g/dL (ref 0.7–1.3)
Gamma Globulin: 0.7 g/dL (ref 0.4–1.8)
Globulin, Total: 2.4 g/dL (ref 2.2–3.9)
Total Protein: 6.3 g/dL (ref 6.0–8.5)

## 2018-12-30 LAB — SEDIMENTATION RATE: Sed Rate: 22 mm/hr (ref 0–40)

## 2018-12-30 LAB — VITAMIN D 25 HYDROXY (VIT D DEFICIENCY, FRACTURES): Vit D, 25-Hydroxy: 33.6 ng/mL (ref 30.0–100.0)

## 2018-12-30 LAB — CK: Total CK: 287 U/L — ABNORMAL HIGH (ref 24–173)

## 2018-12-30 LAB — FOLATE: Folate: >20 ng/mL (ref >3.0)

## 2018-12-30 LAB — TSH: TSH: 1.76 u[IU]/mL (ref 0.450–4.500)

## 2018-12-30 LAB — C-REACTIVE PROTEIN: CRP: 3 mg/L (ref 0–10)

## 2018-12-30 LAB — ANA W/REFLEX IF POSITIVE: Anti Nuclear Antibody(ANA): NEGATIVE

## 2018-12-30 LAB — RPR: RPR Ser Ql: NONREACTIVE

## 2018-12-31 ENCOUNTER — Telehealth: Payer: Self-pay | Admitting: Neurology

## 2018-12-31 NOTE — Telephone Encounter (Signed)
[employer] Berkley Harveyauth: [id number] (exp. 12/31/18 to 02/28/19) order sent to GI pt is aware.

## 2018-12-31 NOTE — Telephone Encounter (Signed)
Patient didn't know if she needed to sign for Dr. Terrace Arabia to get her recorders from [place] from her Neurologist there.

## 2019-01-11 ENCOUNTER — Inpatient Hospital Stay: Admission: RE | Admit: 2019-01-11 | Payer: BLUE CROSS/BLUE SHIELD | Source: Ambulatory Visit

## 2019-01-15 ENCOUNTER — Ambulatory Visit: Payer: Medicare Other | Admitting: Gastroenterology

## 2019-01-15 ENCOUNTER — Encounter: Payer: Self-pay | Admitting: Gastroenterology

## 2019-01-15 VITALS — BP 106/60 | HR 84 | Ht 63.0 in | Wt 158.8 lb

## 2019-01-15 DIAGNOSIS — R058 Other specified cough: Secondary | ICD-10-CM

## 2019-01-15 DIAGNOSIS — Z800 Family history of malignant neoplasm of digestive organs: Secondary | ICD-10-CM

## 2019-01-15 DIAGNOSIS — K219 Gastro-esophageal reflux disease without esophagitis: Secondary | ICD-10-CM

## 2019-01-15 DIAGNOSIS — R05 Cough: Secondary | ICD-10-CM

## 2019-01-15 DIAGNOSIS — K5790 Diverticulosis of intestine, part unspecified, without perforation or abscess without bleeding: Secondary | ICD-10-CM

## 2019-01-15 DIAGNOSIS — R6889 Other general symptoms and signs: Secondary | ICD-10-CM

## 2019-01-15 DIAGNOSIS — R0989 Other specified symptoms and signs involving the circulatory and respiratory systems: Secondary | ICD-10-CM

## 2019-01-15 MED ORDER — OMEPRAZOLE 40 MG PO CPDR: 40.0000 mg | DELAYED_RELEASE_CAPSULE | Freq: Two times a day (BID) | ORAL | 3 refills | Status: DC
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
## 2019-01-15 NOTE — Patient Instructions (Addendum)
If you are age 82 or older, your body mass index should be between 23-30. Your Body mass index is 28.13 kg/m. If this is out of the aforementioned range listed, please consider follow up with your Primary Care Provider.  If you are age 56 or younger, your body mass index should be between 19-25. Your Body mass index is 28.13 kg/m. If this is out of the aformentioned range listed, please consider follow up with your Primary Care Provider.    We have sent the following medications to your pharmacy for you to pick up at your convenience:  Omeprazole 40mg  twice daily.  We will see you back in office in 4 weeks.   Thank you for choosing me and [facility].  Dr. Meridee Score

## 2019-01-17 ENCOUNTER — Encounter: Payer: Self-pay | Admitting: Gastroenterology

## 2019-01-18 DIAGNOSIS — R0989 Other specified symptoms and signs involving the circulatory and respiratory systems: Secondary | ICD-10-CM | POA: Insufficient documentation

## 2019-01-18 DIAGNOSIS — R6889 Other general symptoms and signs: Secondary | ICD-10-CM | POA: Insufficient documentation

## 2019-01-18 DIAGNOSIS — K5790 Diverticulosis of intestine, part unspecified, without perforation or abscess without bleeding: Secondary | ICD-10-CM | POA: Insufficient documentation

## 2019-01-18 DIAGNOSIS — R058 Other specified cough: Secondary | ICD-10-CM | POA: Insufficient documentation

## 2019-01-18 DIAGNOSIS — Z800 Family history of malignant neoplasm of digestive organs: Secondary | ICD-10-CM | POA: Insufficient documentation

## 2019-01-18 DIAGNOSIS — R05 Cough: Secondary | ICD-10-CM | POA: Insufficient documentation

## 2019-01-24 ENCOUNTER — Encounter: Payer: Self-pay | Admitting: Gastroenterology

## 2019-01-24 NOTE — Progress Notes (Signed)
Review of outside records obtained  January 2017 clinic note Chief complaint hiatal hernia Patient was referred for EGD and a diagnosis of GERD was given  October 2016 clinic note EGD follow-up Assessment/plan GERD is chronic and unchanged and diagnosis of GERD without esophagitis Patient given hiatal hernia care instructions  These notes will be scanned into the chart

## 2019-01-25 ENCOUNTER — Encounter: Payer: Medicare Other | Admitting: Neurology

## 2019-01-29 ENCOUNTER — Other Ambulatory Visit: Payer: Self-pay

## 2019-02-19 ENCOUNTER — Encounter: Payer: Self-pay | Admitting: Gastroenterology

## 2019-02-19 ENCOUNTER — Ambulatory Visit: Payer: Medicare Other | Admitting: Gastroenterology

## 2019-02-19 ENCOUNTER — Other Ambulatory Visit: Payer: Self-pay

## 2019-02-19 VITALS — BP 118/68 | HR 84 | Ht 63.5 in | Wt 161.0 lb

## 2019-02-19 DIAGNOSIS — Z800 Family history of malignant neoplasm of digestive organs: Secondary | ICD-10-CM

## 2019-02-19 DIAGNOSIS — K219 Gastro-esophageal reflux disease without esophagitis: Secondary | ICD-10-CM

## 2019-02-19 DIAGNOSIS — R05 Cough: Secondary | ICD-10-CM | POA: Diagnosis not present

## 2019-02-19 DIAGNOSIS — R0989 Other specified symptoms and signs involving the circulatory and respiratory systems: Secondary | ICD-10-CM

## 2019-02-19 DIAGNOSIS — R6889 Other general symptoms and signs: Secondary | ICD-10-CM

## 2019-02-19 DIAGNOSIS — R058 Other specified cough: Secondary | ICD-10-CM

## 2019-02-19 DIAGNOSIS — K439 Ventral hernia without obstruction or gangrene: Secondary | ICD-10-CM

## 2019-02-19 MED ORDER — ESOMEPRAZOLE MAGNESIUM 40 MG PO CPDR: 40.0000 mg | DELAYED_RELEASE_CAPSULE | Freq: Every day | ORAL | 2 refills | Status: DC

## 2019-02-19 NOTE — Patient Instructions (Signed)
If you are age 82 or older, your body mass index should be between 23-30. Your Body mass index is 28.07 kg/m. If this is out of the aforementioned range listed, please consider follow up with your Primary Care Provider.  If you are age 30 or younger, your body mass index should be between 19-25. Your Body mass index is 28.07 kg/m. If this is out of the aformentioned range listed, please consider follow up with your Primary Care Provider.    You have been scheduled for a CT scan of the abdomen and pelvis at [facility] ([address]---this is in the same building as Press photographer).   You are scheduled on 3/20/2020at 1:00pm. You should arrive 15 minutes prior to your appointment time for registration. Please follow the written instructions below on the day of your exam:  WARNING: IF YOU ARE ALLERGIC TO IODINE/X-RAY DYE, PLEASE NOTIFY RADIOLOGY IMMEDIATELY AT [phone]! YOU WILL BE GIVEN A 13 HOUR PREMEDICATION PREP.  1) Do not eat or drink anything after 9:00am (4 hours prior to your test) 2) You have been given 2 bottles of oral contrast to drink. The solution may taste better if refrigerated, but do NOT add ice or any other liquid to this solution. Shake well before drinking.    Drink 1 bottle of contrast @ 11:00am(2 hours prior to your exam)  Drink 1 bottle of contrast @ 12:00pm (1 hour prior to your exam)  You may take any medications as prescribed with a small amount of water, if necessary. If you take any of the following medications: METFORMIN, GLUCOPHAGE, GLUCOVANCE, AVANDAMET, RIOMET, FORTAMET, [place] MET, JANUMET, GLUMETZA or METAGLIP, you MAY be asked to HOLD this medication 48 hours AFTER the exam.  The purpose of you drinking the oral contrast is to aid in the visualization of your intestinal tract. The contrast solution may cause some diarrhea. Depending on your individual set of symptoms, you may also receive an intravenous injection of x-ray contrast/dye.  Plan on being at [facility] for 30 minutes or longer, depending on the type of exam you are having performed.  This test typically takes 30-45 minutes to complete.  If you have any questions regarding your exam or if you need to reschedule, you may call the CT department at [phone] between the hours of 8:00 am and 5:00 pm, Monday-Friday.  ________________________________________________________________________   We have sent the following medications to your pharmacy for you to pick up at your convenience: Nexium daily at bedtime.   Thank you for choosing me and [facility].  Dr. Rush Landmark

## 2019-02-19 NOTE — Progress Notes (Signed)
GASTROENTEROLOGY OUTPATIENT CLINIC VISIT   Primary Care Provider Jarome Matin, MD [address] [place] [address] [phone]  Patient Profile: Hailey Perkins is a 84 y.o. female with a pmh significant for arthritis, osteopenia, hyperlipidemia, glaucoma, urinary incontinence, peripheral neuropathy, GERD, diverticulosis, family history colon cancer (father in 78s and maternal grandmother), status post abdominoplasty with hernia repair (with mesh placement), status post appendectomy, status post small bowel resection (in 90s for question fatty tumor), PUD (reported in the 1970s).  The patient presents to the [facility] for an evaluation and management of problem(s) noted below:  Problem List 1. Ventral hernia without obstruction or gangrene   2. Gastroesophageal reflux disease, esophagitis presence not specified   3. Chronic throat clearing   4. Nocturnal cough   5. Family history of colon cancer in father     History of Present Illness Please see initial consultation note for full details of HPI.    Interval History Patient returns for scheduled follow-up.  Since transitioning her PPI dose to the evening time she does feel that things are improved though not 100% better.  She would like to try and see how her symptoms do with just once daily dosing rather than increasing her dose to twice daily dosing.  She was also told that omeprazole is no longer covered by her insurance and that she would need to be transitioned on her PPI.  She is previously had issues with Protonix but is never been on Nexium or Dexilant.  Her sore throat is better than it had been previously.  The patient wonders about the need for her ventral hernia to be further evaluated based on some of the abdominal discomfort that she is still experiencing.  Her abdominal surgery occurred in 1993 at [facility] Ventral hernia with general surgeon Dr. Hulan Fray and then abdominoplasty done  by plastic surgeon Dr. Linford Arnold her weight has been stable.  She is not had any nausea or vomiting.  GI Review of Systems Positive as above including very infrequent bloating Negative for dysphagia, odynophagia, jaundice, change in appetite, weight loss, melena, hematochezia  Review of Systems General: Denies fevers/chills HEENT: Denies Cardiovascular: Denies chest pain/palpitations Pulmonary: Denies shortness of breath Gastroenterological: See HPI Genitourinary: Denies darkened urine Hematological: Denies easy bruising Dermatological: Denies jaundice Psychological: Mood is stable   Medications Current Outpatient Medications  Medication Sig Dispense Refill   Ascorbic Acid (VITAMIN C) 1000 MG tablet Take 1,000 mg by mouth daily.     aspirin EC 81 MG tablet Take 81 mg by mouth daily.     betamethasone dipropionate 0.05 % lotion APP ON THE SKIN D  0   Biotin 10 MG TABS Take by mouth.     Calcium Citrate-Vitamin D (CALCIUM + D PO) Take by mouth daily.     CELEBREX 200 MG capsule Take 1 capsule (200 mg total) by mouth daily as needed. 90 capsule 1   Cholecalciferol (VITAMIN D3) 2000 units TABS Take 1 tablet by mouth daily.     dorzolamide (TRUSOPT) 2 % ophthalmic solution 1 drop 2 (two) times daily.     DULoxetine (CYMBALTA) 30 MG capsule TAKE 1 CAPSULE(30 MG) BY MOUTH DAILY 90 capsule 2   GINKGO BILOBA PO Take by mouth daily.     GLUCOSAMINE-CHONDROITIN-MSM PO Take by mouth. Take 2 oz. Daily.  Glucosamine 2,000 mg Chondroitin 1,200 mg, MSM 500 mg, Hyaluronic Acid 10 mg.     HYPROMELLOSE OP Place into both eyes at bedtime.  Magnesium 250 MG TABS Take 1 tablet by mouth daily.     metroNIDAZOLE (METROGEL) 1 % gel Apply 1 application topically as needed (rosacea).     mirabegron ER (MYRBETRIQ) 50 MG TB24 tablet Take by mouth.     Omega-3 Fatty Acids (FISH OIL) 1000 MG CAPS Take 1 capsule by mouth.     omeprazole (PRILOSEC) 40 MG capsule Take 1 capsule (40 mg  total) by mouth 2 (two) times daily. 60 capsule 3   Plant Sterols and Stanols (CHOLESTOFF PLUS PO) Take 1 capsule by mouth daily.     Polyvinyl Alcohol-Povidone (FRESHKOTE) 2.7-2 % SOLN Place 1 drop into both eyes 4 times daily.     psyllium (METAMUCIL) 58.6 % packet Take 1 packet by mouth daily.     Sodium Fluoride (PREVIDENT 5000 BOOSTER PLUS) 1.1 % PSTE Place onto teeth daily.     TURMERIC CURCUMIN PO Take by mouth daily.     UNABLE TO FIND CBD oil for pain, as needed.  Hemp flower extract for inflammation, as needed.     esomeprazole (NEXIUM) 40 MG capsule Take 1 capsule (40 mg total) by mouth at bedtime. 30 capsule 2   No current facility-administered medications for this visit.     Allergies Allergies  Allergen Reactions   Protonix [Pantoprazole Sodium]     Pt stated, "Blood pin-point rash"   Tape Other (See Comments)    Adhesive Tape; pt stated "itch and redness" Adhesive Tape; pt stated "itch and redness"   Pantoprazole Rash    Histories Past Medical History:  Diagnosis Date   Arthritis    Chicken pox    GERD (gastroesophageal reflux disease)    Glaucoma    History of frequent urinary tract infections    Hyperlipidemia    Osteopenia    Peripheral neuropathy    Urine incontinence    Past Surgical History:  Procedure Laterality Date   ABDOMINAL HYSTERECTOMY     APPENDECTOMY     BREAST BIOPSY     BUNIONECTOMY Left 1975   Left Toe   HAMMER TOE SURGERY Right 2005   Right foot correction hammertoe little toe   NEUROMA SURGERY Right 2003   Right Foot Neuroma, plus Bunionectomy, Right Great Toe   TONSILLECTOMY AND ADENOIDECTOMY     Social History   Socioeconomic History   Marital status: Widowed    Spouse name: Not on file   Number of children: 2   Years of education: college   Highest education level: Master's degree (e.g., MA, MS, MEng, MEd, MSW, MBA)  Occupational History   Occupation: Retired  Medical laboratory scientific officer strain: Not on Pensions consultant insecurity:    Worry: Not on file    Inability: Not on Occupational hygienist needs:    Medical: Not on file    Non-medical: Not on file  Tobacco Use   Smoking status: Never Smoker   Smokeless tobacco: Never Used  Substance and Sexual Activity   Alcohol use: No   Drug use: No   Sexual activity: Never  Lifestyle   Physical activity:    Days per week: Not on file    Minutes per session: Not on file   Stress: Not on file  Relationships   Social connections:    Talks on phone: Not on file    Gets together: Not on file    Attends religious service: Not on file    Active member of club or organization: Not  on file    Attends meetings of clubs or organizations: Not on file    Relationship status: Not on file   Intimate partner violence:    Fear of current or ex partner: Not on file    Emotionally abused: Not on file    Physically abused: Not on file    Forced sexual activity: Not on file  Other Topics Concern   Not on file  Social History Narrative   Lives alone but her daughter lives next door.   Right-handed.   Occasional caffeine.   Family History  Problem Relation Age of Onset   Hyperlipidemia Mother    Heart disease Mother    Hypertension Mother    Mental illness Mother    Cancer Father    Colon cancer Father    Alcohol abuse Brother    Drug abuse Brother    Cancer Brother    Cancer Maternal Aunt    Breast cancer Maternal Aunt    Colon cancer Maternal Grandmother    Esophageal cancer Neg Hx    Inflammatory bowel disease Neg Hx    Liver disease Neg Hx    Pancreatic cancer Neg Hx    Rectal cancer Neg Hx    Stomach cancer Neg Hx    I have reviewed her medical, social, and family history in detail and updated the electronic medical record as necessary.    PHYSICAL EXAMINATION  BP 118/68 (BP Location: Left Arm, Patient Position: Sitting, Cuff Size: Normal)    Pulse 84    Ht 5' 3.5" (1.613 m)     Wt 161 lb (73 kg)    BMI 28.07 kg/m  Wt Readings from Last 3 Encounters:  02/19/19 161 lb (73 kg)  01/15/19 158 lb 12.8 oz (72 kg)  12/27/18 161 lb (73 kg)  GEN: NAD, appears stated age, doesn't appear chronically ill PSYCH: Cooperative, without pressured speech EYE: Conjunctivae pink, sclerae anicteric ENT: MMM CV: RR without R/Gs  RESP: CTAB posteriorly, without wheezing GI: NABS, soft, NT/ND, surgical scars present, without rebound or guarding MSK/EXT: Very minimal bilateral lower extremity edema SKIN: No jaundice NEURO:  Alert & Oriented x 3, no focal deficits   REVIEW OF DATA  I reviewed the following data at the time of this encounter:  GI Procedures and Studies  Still awaiting EGD records and previous endoscopies can be found on initial consultation note  Laboratory Studies  Reviewed in epic Imaging Studies  No relevant studies to review   ASSESSMENT  Ms. Bufano is a 84 y.o. female with a pmh significant for arthritis, osteopenia, hyperlipidemia, glaucoma, urinary incontinence, peripheral neuropathy, GERD, diverticulosis, family history colon cancer (father in 73s and maternal grandmother), status post abdominoplasty with hernia repair (with mesh placement), status post appendectomy, status post small bowel resection (in 90s for question fatty tumor), PUD (reported in the 1970s).  The patient is seen today for evaluation and management of:  1. Ventral hernia without obstruction or gangrene   2. Gastroesophageal reflux disease, esophagitis presence not specified   3. Chronic throat clearing   4. Nocturnal cough   5. Family history of colon cancer in father    The patient returns for scheduled follow-up and is clinically and hemodynamically well.  She has had some improvement with transitioning her PPI to evening without having as many symptoms in the morning of sore throat or nocturnal coughing.  We are still awaiting her endoscopy results and will try to send another  release of information to  try and obtain those results.  We will hold on ENT referral for now consider the role of an upper endoscopy if her symptoms persist.  She will need to be ruled out for EOE and lymphocytic esophagitis.  I would like to see how she does with once daily dosing of her medication.  We will need to transition her to Nexium since she is no longer covered with omeprazole and has had issues with Protonix.  If Nexium is nonfunctional for her then we can consider Dexilant and/or then asking the insurance for authorization for omeprazole.  I think her symptoms of bloating when she leans forward still sound like a hiatal hernia and I do not think that this is a result of her prior ventral abdominoplasty and hernia repair.  With that being said she still is having some abdominal discomforts.  We will order a nonurgent CT of the abdomen/pelvis to further evaluate her mesh and ensure that she has no other issues around her previous surgical sites.  I am holding on Gaviscon for now.  She is not due for colon cancer screening until 2020.  All patient questions were answered, to the best of my ability, and the patient agrees to the aforementioned plan of action with follow-up as indicated.   PLAN  Continue omeprazole 40 mg at dinnertime and will eventually transition to Nexium 40 mg at dinnertime If patient fails Nexium then consider Dexilant and/or omeprazole with insurance authorization needed to been be pursued Hold on diagnostic endoscopy Try to get the outside EGD records from previous endoscopy Try to get records from previous surgeon for uploaded into our system (patient has signed release of information) Hold on pH impedance/manometry and hold on ENT evaluation   Orders Placed This Encounter  Procedures   CT Abdomen Pelvis W Contrast    New Prescriptions   ESOMEPRAZOLE (NEXIUM) 40 MG CAPSULE    Take 1 capsule (40 mg total) by mouth at bedtime.   Modified Medications   No  medications on file    Planned Follow Up: Return in about 2 months (around 04/21/2019).   Corliss Parish, MD [facility] Advanced Endoscopy Office # [phone]

## 2019-02-20 ENCOUNTER — Telehealth: Payer: Self-pay

## 2019-02-20 NOTE — Telephone Encounter (Signed)
CT is asking if appropriate to reschedule upcoming CT until after 03/15/19. Please advise on urgency

## 2019-02-21 NOTE — Telephone Encounter (Signed)
OK for the CT to wait until then if necessary. Thanks. GM

## 2019-02-22 ENCOUNTER — Other Ambulatory Visit: Payer: Self-pay | Admitting: Internal Medicine

## 2019-02-22 ENCOUNTER — Encounter: Payer: Self-pay | Admitting: Gastroenterology

## 2019-02-22 ENCOUNTER — Inpatient Hospital Stay: Admission: RE | Admit: 2019-02-22 | Payer: Medicare Other | Source: Ambulatory Visit

## 2019-02-22 DIAGNOSIS — K439 Ventral hernia without obstruction or gangrene: Secondary | ICD-10-CM | POA: Insufficient documentation

## 2019-02-22 DIAGNOSIS — R6889 Other general symptoms and signs: Secondary | ICD-10-CM

## 2019-02-22 NOTE — Progress Notes (Signed)
Review of outside records to be scanned in the chart  April 2017 colonoscopy Multiple nonbleeding diverticula were seen in the sigmoid colon otherwise normal colon. Plan colonoscopy in 5 years due to family history of colon cancer and personal history of polyps  April 2012 colonoscopy Single sessile 4 mm polyp in the distal rectum removed with hot forceps. Multiple diverticula in the sigmoid colon. Pathology returned showing evidence of normal colonic mucosa and submucosal aggregates.  Thus there was no evidence of adenoma  August 2012 clinic visit Patient with lower abdominal pain including fever without diarrhea or other changes in bowel habits.  Colon cancer in her father at age 89.  Also patient previously had a tubular adenoma in the past per report though I do not have access to that colonoscopy report Given information on diverticulosis

## 2019-02-25 ENCOUNTER — Ambulatory Visit: Payer: Medicare Other | Admitting: Physical Therapy

## 2019-02-26 ENCOUNTER — Encounter: Payer: Self-pay | Admitting: Gastroenterology

## 2019-02-26 NOTE — Progress Notes (Unsigned)
Review of outside records  Hailey Blitz, MD notation  Encounter date 12/22/2015 Problem being reviewed today is GERD Patient denies being symptomatic with no difficulty swallowing or pain with swallowing.  She is taking ranitidine at the time.  She thinks is well working for her.  She is belching.  She wants to see if her IO hernia is still present and if it needs to be repaired. EGD was scheduled to be performed  Encounter date 09/22/2015 Patient being evaluated for EGD follow-up Patient describing dyspepsia and GERD symptoms.  With a physical exam showing no evidence of any GI issues or abdominal complaints.  Patient was given recommendations for GERD treatment and also hiatal hernia care instructions.  I do not see any evidence of a EGD report.  We will have these notes scanned into the chart We will try to attempt obtaining any other report results of her actual EGD that she underwent

## 2019-03-01 ENCOUNTER — Encounter: Payer: Medicare Other | Admitting: Neurology

## 2019-03-13 ENCOUNTER — Ambulatory Visit
Admission: RE | Admit: 2019-03-13 | Discharge: 2019-03-13 | Disposition: A | Discharge status: Home / Self Care | Payer: Medicare Other | Source: Ambulatory Visit | Attending: Internal Medicine | Admitting: Internal Medicine

## 2019-03-13 ENCOUNTER — Other Ambulatory Visit: Payer: Self-pay

## 2019-03-13 DIAGNOSIS — R6889 Other general symptoms and signs: Secondary | ICD-10-CM
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
## 2019-03-28 NOTE — Telephone Encounter (Signed)
I think it is OK for the patient to be held until June 1, as we were exploring an issue she has had for quite awhile in regards to her history of hernias. I would check in with the patient and make sure she is OK with this, with the continued Pandemic occurring, and if she is then proceed with scheduling out into June. Please let me know if there are other issues in the interim. Thanks. GM

## 2019-03-28 NOTE — Telephone Encounter (Signed)
Dr. Meridee Score,  Okay to continue to delay CT due to COVID-19 until after June 1

## 2019-04-12 ENCOUNTER — Other Ambulatory Visit: Payer: Self-pay

## 2019-04-12 DIAGNOSIS — R058 Other specified cough: Secondary | ICD-10-CM

## 2019-04-12 DIAGNOSIS — R6889 Other general symptoms and signs: Secondary | ICD-10-CM

## 2019-04-12 DIAGNOSIS — K439 Ventral hernia without obstruction or gangrene: Secondary | ICD-10-CM

## 2019-04-12 DIAGNOSIS — R0989 Other specified symptoms and signs involving the circulatory and respiratory systems: Secondary | ICD-10-CM

## 2019-04-12 DIAGNOSIS — R05 Cough: Secondary | ICD-10-CM

## 2019-04-12 DIAGNOSIS — K219 Gastro-esophageal reflux disease without esophagitis: Secondary | ICD-10-CM

## 2019-04-12 DIAGNOSIS — K5790 Diverticulosis of intestine, part unspecified, without perforation or abscess without bleeding: Secondary | ICD-10-CM

## 2019-04-12 DIAGNOSIS — Z800 Family history of malignant neoplasm of digestive organs: Secondary | ICD-10-CM

## 2019-04-15 ENCOUNTER — Other Ambulatory Visit (INDEPENDENT_AMBULATORY_CARE_PROVIDER_SITE_OTHER): Payer: Medicare Other

## 2019-04-15 DIAGNOSIS — R058 Other specified cough: Secondary | ICD-10-CM

## 2019-04-15 DIAGNOSIS — R05 Cough: Secondary | ICD-10-CM | POA: Diagnosis not present

## 2019-04-15 DIAGNOSIS — Z800 Family history of malignant neoplasm of digestive organs: Secondary | ICD-10-CM

## 2019-04-15 DIAGNOSIS — K219 Gastro-esophageal reflux disease without esophagitis: Secondary | ICD-10-CM

## 2019-04-15 DIAGNOSIS — R6889 Other general symptoms and signs: Secondary | ICD-10-CM

## 2019-04-15 DIAGNOSIS — K5790 Diverticulosis of intestine, part unspecified, without perforation or abscess without bleeding: Secondary | ICD-10-CM

## 2019-04-15 DIAGNOSIS — R0989 Other specified symptoms and signs involving the circulatory and respiratory systems: Secondary | ICD-10-CM

## 2019-04-15 DIAGNOSIS — K439 Ventral hernia without obstruction or gangrene: Secondary | ICD-10-CM

## 2019-04-15 LAB — COMPREHENSIVE METABOLIC PANEL
ALT: 29 U/L (ref 0–35)
AST: 29 U/L (ref 0–37)
Albumin: 3.9 g/dL (ref 3.5–5.2)
Alkaline Phosphatase: 81 U/L (ref 39–117)
BUN: 16 mg/dL (ref 6–23)
CO2: 29 mEq/L (ref 19–32)
Calcium: 8.7 mg/dL (ref 8.4–10.5)
Chloride: 104 mEq/L (ref 96–112)
Creatinine, Ser: 0.77 mg/dL (ref 0.40–1.20)
GFR: 71.8 mL/min (ref >60.00)
Glucose, Bld: 96 mg/dL (ref 70–99)
Potassium: 4.3 mEq/L (ref 3.5–5.1)
Sodium: 140 mEq/L (ref 135–145)
Total Bilirubin: 0.4 mg/dL (ref 0.2–1.2)
Total Protein: 6.2 g/dL (ref 6.0–8.3)

## 2019-04-19 ENCOUNTER — Inpatient Hospital Stay: Admission: RE | Admit: 2019-04-19 | Payer: Medicare Other | Source: Ambulatory Visit

## 2019-04-23 ENCOUNTER — Telehealth: Payer: Self-pay | Admitting: *Deleted

## 2019-04-23 NOTE — Telephone Encounter (Signed)
Pt verified. Confirmed appointment and completed screening questions.   PT COVID-19 Pre-Screening Questions:  In the past 7 to 10 days have you had a cough, shortness of breath, headache, congestion, fever, body aches, chills, sore throat, or sudden loss of taste or sense of smell? no  Have you been around anyone with known Covid 19. no  Have you been around anyone who is awaiting Covid 19 test results in the past 7 to 10 days? no  Have you been around anyone who has been exposed to Covid 19, or has mentioned symptoms of Covid 19 within the past 7 to 10 days? no If you have any concerns about symptoms your patients report please contact your leadership team, or the provider the patient is seeing in the office for further guidance.

## 2019-04-24 ENCOUNTER — Other Ambulatory Visit: Payer: Self-pay

## 2019-04-24 ENCOUNTER — Ambulatory Visit (INDEPENDENT_AMBULATORY_CARE_PROVIDER_SITE_OTHER)
Admission: RE | Admit: 2019-04-24 | Discharge: 2019-04-24 | Disposition: A | Discharge status: Home / Self Care | Payer: Medicare Other | Source: Ambulatory Visit | Attending: Gastroenterology | Admitting: Gastroenterology

## 2019-04-24 DIAGNOSIS — K439 Ventral hernia without obstruction or gangrene: Secondary | ICD-10-CM | POA: Diagnosis not present

## 2019-04-24 DIAGNOSIS — K219 Gastro-esophageal reflux disease without esophagitis: Secondary | ICD-10-CM

## 2019-04-24 IMAGING — CT CT ABDOMEN AND PELVIS WITH CONTRAST
2 of 5 series · 14 of 46 positions shown, 16 images · IV contrast (OMNIPAQUE 300)
Comparison: Chest CT-[DATE]

CLINICAL DATA: History of ventral abdominal hernia repair with
mesh, now with intermittent discomfort and concern for recurrence.

EXAM:
CT ABDOMEN AND PELVIS WITH CONTRAST
TECHNIQUE: Multidetector CT imaging of the abdomen and pelvis was performed
using the standard protocol following bolus administration of
intravenous contrast.
CONTRAST:  100mL OMNIPAQUE IOHEXOL 300 MG/ML  SOLN

[Series 2: abd/pel w · axial · 0.68mm/px · z∈[-429,-34]mm · 11 of 89 slices shown, 13 images]
[im 5/89  soft-tissue]
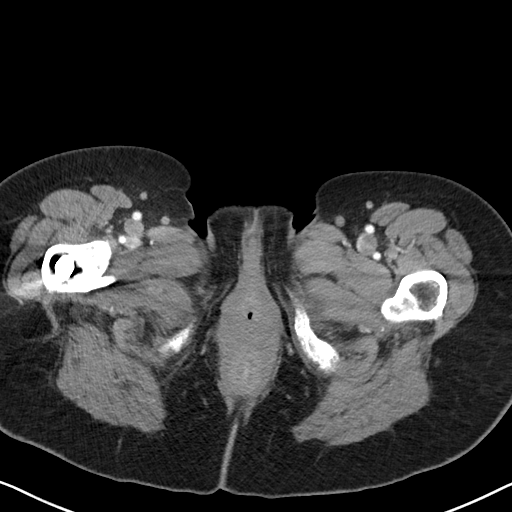
[im 5/89  bone]
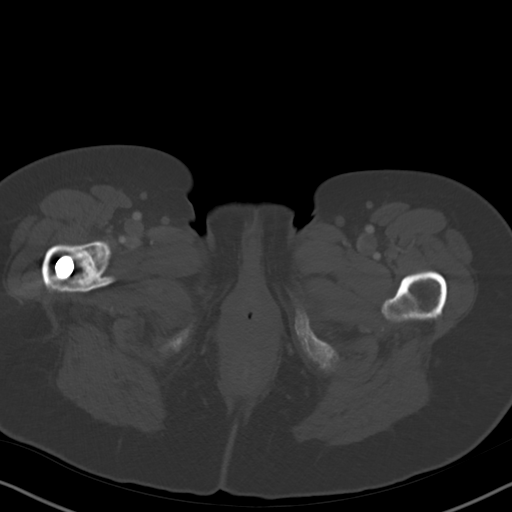
[im 15/89  soft-tissue]
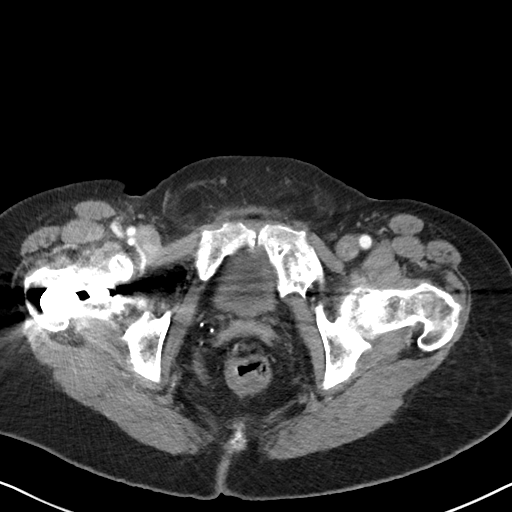
[im 20/89  soft-tissue]
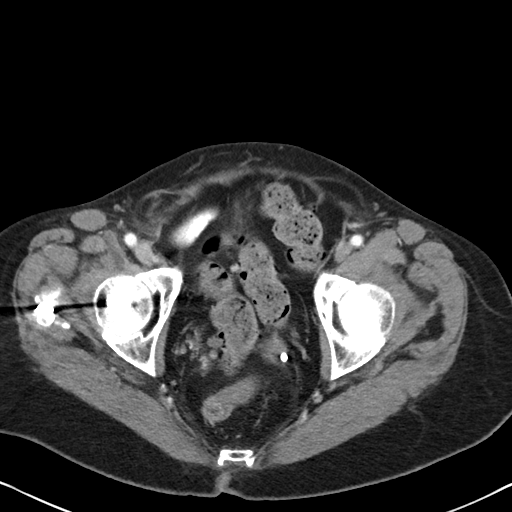
[im 30/89  soft-tissue]
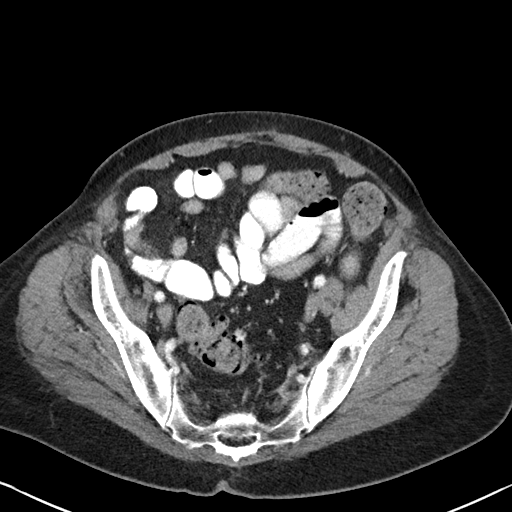
[im 35/89  soft-tissue]
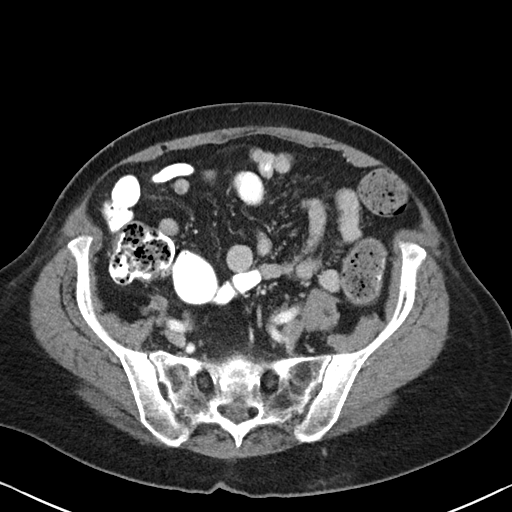
[im 45/89  soft-tissue]
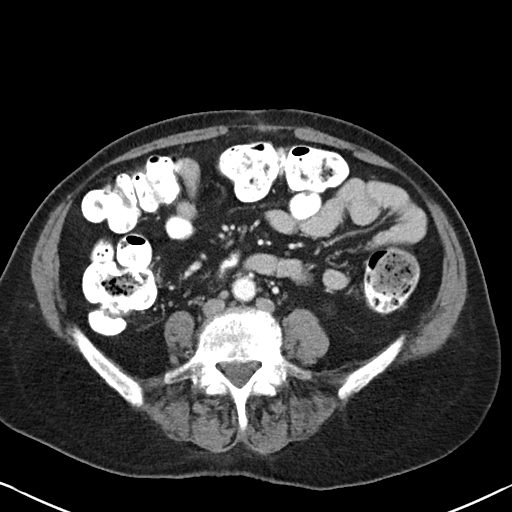
[im 54/89  soft-tissue]
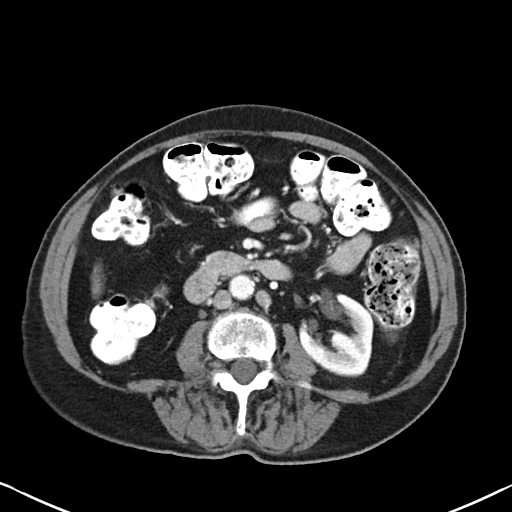
[im 59/89  soft-tissue]
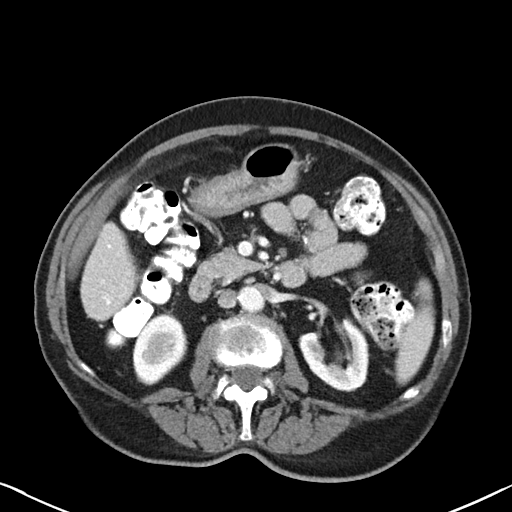
[im 69/89  soft-tissue]
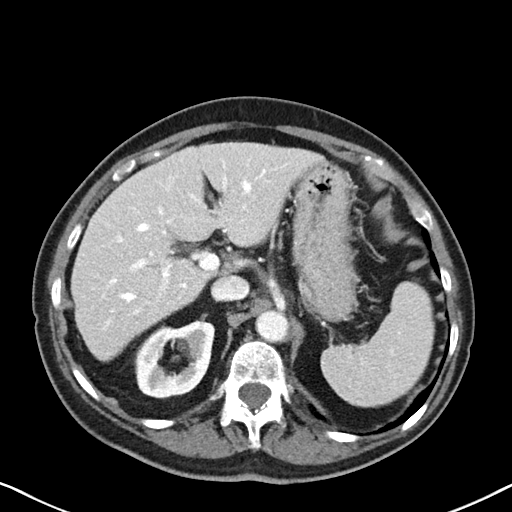
[im 69/89  bone]
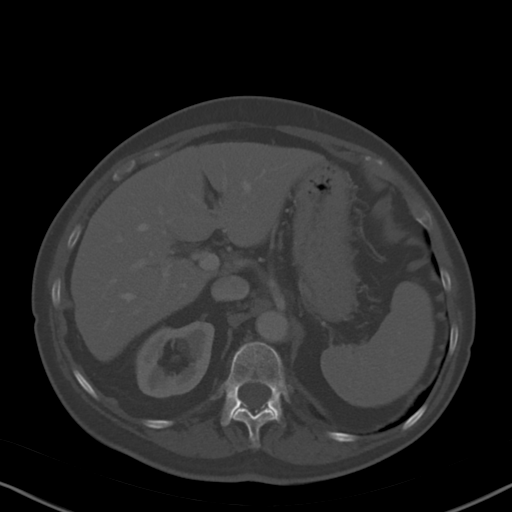
[im 74/89  soft-tissue]
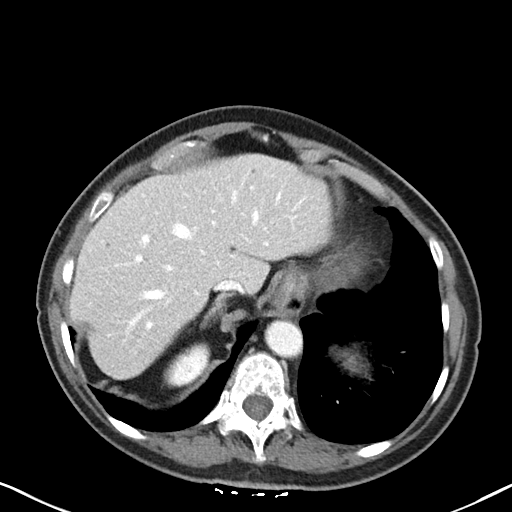
[im 84/89  soft-tissue]
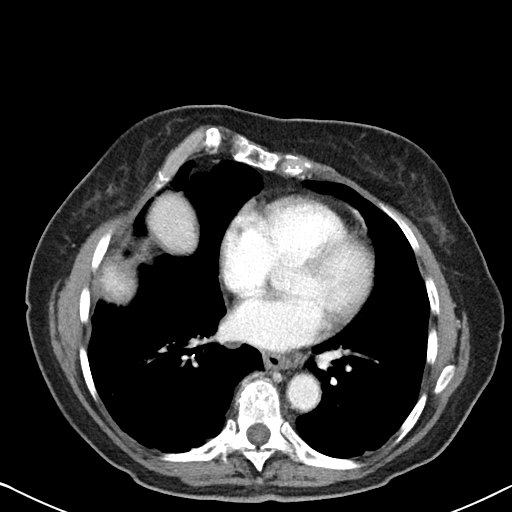

[Series 6: abd/pel w st · coronal · 0.68mm/px · 3 of 91 slices shown]
[im 31/91  soft-tissue]
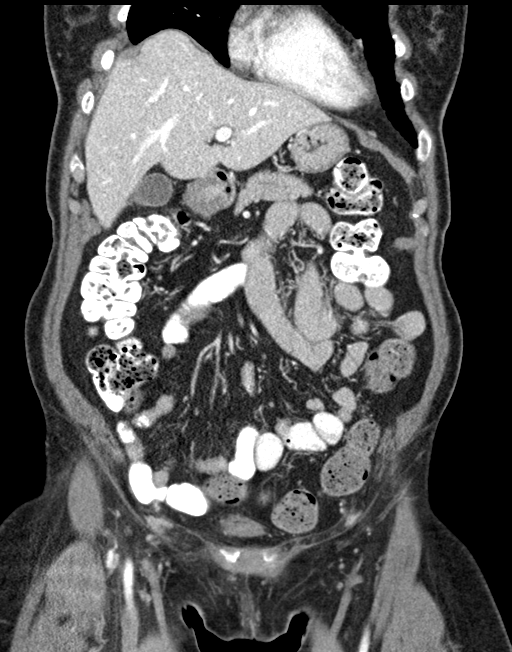
[im 41/91  soft-tissue]
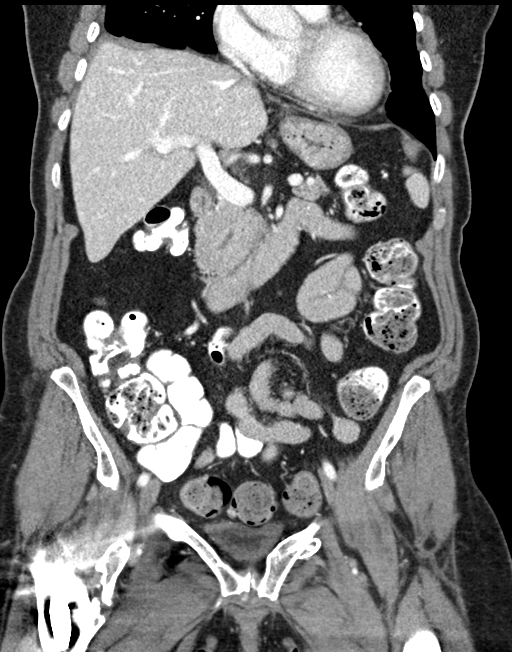
[im 51/91  soft-tissue]
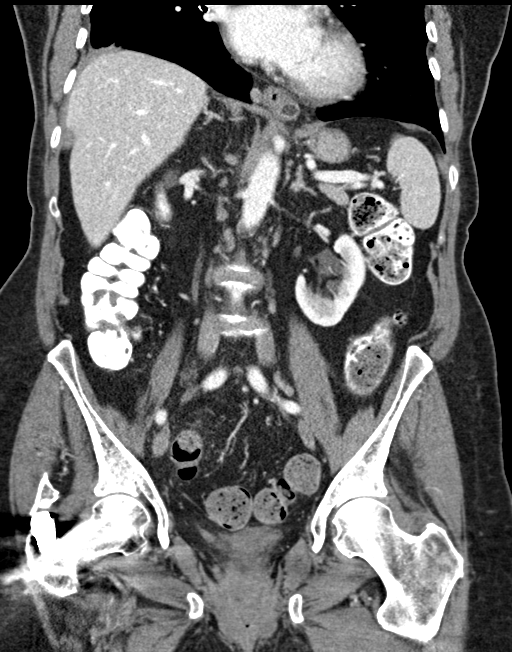

[14 of 46 positions shown; findings below may reference images not displayed]

FINDINGS: Lower chest: Limited visualization of the lower thorax demonstrates
subsegmental atelectasis within the image right middle lobe and
lingula. No discrete focal airspace opacities. No pleural effusion.

Normal heart size. Trace amount of pericardial fluid, presumably
physiologic.

Hepatobiliary: Normal hepatic contour. Scattered punctate
subcentimeter hypoattenuating liver lesions are too small to
accurately characterize though favored to represent hepatic cysts.
Suspected layering radiopaque stones/sludge within the neck of an
otherwise normal-appearing gallbladder (image 26, series 2). No
gallbladder wall thickening or pericholecystic fluid. No intra
extrahepatic biliary ductal dilatation. No ascites.

Pancreas: Normal appearance of the pancreas.

Spleen: Normal appearance of the spleen.

Adrenals/Urinary Tract: There is symmetric enhancement and excretion
of the bilateral kidneys. Multiple parapelvic cysts are seen
bilaterally. Punctate bilateral hypoattenuating renal lesions are
too small to adequately characterize though favored to represent
renal cysts. No definite renal stones this postcontrast examination.
No urinary obstruction or perinephric stranding.

Normal appearance of the bilateral adrenal glands. Normal appearance
of the urinary bladder given underdistention.

Stomach/Bowel: Ingested enteric contrast extends to the level of the
descending colon. Large colonic stool burden without evidence of
enteric obstruction. Normal appearance of the terminal ileum. The
appendix is not visualized, however there is no pericecal
inflammatory change. Small hiatal hernia. No pneumoperitoneum,
pneumatosis or portal venous gas.

Vascular/Lymphatic: Atherosclerotic plaque within a normal caliber
abdominal aorta. The major branch vessels of the abdominal aorta
appear patent on this non CTA examination. Note is made of
duplicated left renal arteries. Note is made of a duplicated IVC.

Scattered porta hepatis, retroperitoneal and mesenteric lymph nodes
are numerous though individually not enlarged by size criteria,
presumably reactive in etiology. No bulky retroperitoneal,
mesenteric, pelvic or inguinal lymphadenopathy.

Reproductive: Post hysterectomy. No discrete adnexal lesion. No free
fluid in the pelvic cul-de-sac.

Other: Note is made of a approximately 6.2 x 2.0 cm mesenteric fat
containing right-sided direct inguinal hernia. Post repair of the
ventral abdominal wall without evidence of recurrent hernia

Musculoskeletal: No acute or aggressive osseous abnormalities.
Moderate multilevel lumbar spine DDD, worse at L4-L5 with disc space
height loss, endplate irregularity and sclerosis. Post
intramedullary rod fixation of the right femur and screw fixation of
the right femoral neck. Sequela of prior avulsive injury involving
the right greater and lesser trochanters. Old/healed left superior
and inferior pubic rami fractures. Potential posttraumatic deformity
involving the anterior aspect of the image right lower thorax,
similar to the [DATE] examination.
IMPRESSION: 1. Post ventral abdominal wall hernia repair without evidence
recurrence.
2. Note made of a small (approximately 6.2 x 2.0 cm) mesenteric fat
containing right-sided inguinal hernia.
3. Small hiatal hernia.
4. Large colonic stool burden without evidence of enteric
obstruction.
5. Suspected cholelithiasis without evidence cholecystitis
6. Aortic Atherosclerosis ([AH]-[AH]).

## 2019-04-24 MED ORDER — IOHEXOL 300 MG/ML  SOLN
100.0000 mL | Freq: Once | INTRAMUSCULAR | Status: AC | PRN
  Administered 2019-04-24: 100 mL via INTRAVENOUS

## 2019-05-06 ENCOUNTER — Ambulatory Visit (INDEPENDENT_AMBULATORY_CARE_PROVIDER_SITE_OTHER): Payer: Medicare Other | Admitting: Neurology

## 2019-05-06 ENCOUNTER — Other Ambulatory Visit: Payer: Self-pay

## 2019-05-06 ENCOUNTER — Encounter: Payer: Self-pay | Admitting: Neurology

## 2019-05-06 VITALS — BP 133/81 | HR 76 | Temp 97.1°F | Ht 63.5 in | Wt 165.0 lb

## 2019-05-06 DIAGNOSIS — G4719 Other hypersomnia: Secondary | ICD-10-CM | POA: Diagnosis not present

## 2019-05-06 DIAGNOSIS — N3281 Overactive bladder: Secondary | ICD-10-CM | POA: Diagnosis not present

## 2019-05-06 DIAGNOSIS — M792 Neuralgia and neuritis, unspecified: Secondary | ICD-10-CM

## 2019-05-06 DIAGNOSIS — M5010 Cervical disc disorder with radiculopathy, unspecified cervical region: Secondary | ICD-10-CM

## 2019-05-06 DIAGNOSIS — R0683 Snoring: Secondary | ICD-10-CM

## 2019-05-06 DIAGNOSIS — G3281 Cerebellar 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: Secondary | ICD-10-CM

## 2019-05-06 NOTE — Patient Instructions (Signed)

## 2019-05-06 NOTE — Progress Notes (Signed)
[facility]    Provider:  Melvyn Novas, M D  Referring Provider: Jarome Matin, MD Primary Care Physician:  Jarome Matin, MD  Chief Complaint  Patient presents with  . New Patient (Initial Visit)    pt alone, rm 10. pt states that she has been having problems with memory. she states that she never gets more then 5 hours of sleep in a row. pt admits to snoring. never had a sleep study before.     HPI:  Hailey Perkins is a 84 y.o. female patient and  seen here face to face today on 05-06-2019  Upon referral from Dr. Eloise Harman for a sleep evaluation. The chief compliant is fatigue.  The patient has already been seen By Dr Terrace Arabia at [facility] in January 2020, and addressed memory problems, gait problems. Here is a list of her medical diagnoses.  A brain MRI had been obtained upon Dr Zannie Cove orders, on 03-13-2019. This was read as normal and discussed with the patient.     Generalized osteoarthritis of multiple sites    Cervical disc disorder with radiculopathy of cervical region    Carpal tunnel syndrome, right upper limb    Mixed hyperlipidemia    Peripheral neuropathic pain    Overactive bladder    Glaucoma    Osteopenia    Abnormal finding on mammography    Blood in urine    Breast lump    Early stage 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    Epiretinal membrane (ERM) of left eye    Pseudophakia of both eyes    Secondary open-angle glaucoma of left eye, moderate stage    GERD (gastroesophageal reflux disease)    DDD (degenerative disc disease), lumbar    Primary osteoarthritis of both hands    Primary osteoarthritis of both feet    Status post total bilateral knee replacement    DDD (degenerative disc disease), cervical    Status post carpal tunnel release    Gait abnormality    Numbness     Social History- retired Runner, broadcasting/film/video, Comptroller, Armed forces logistics/support/administrative officer. Widowed. Adult children. Son with OSA, daughter lives closer by.  No tobacco use. Rare alcohol, not in the last 2 decades. Caffeine: not daily/ Mostly whenhe drives long distance.  Loves to swim- pool is closed now.    Sleep habits: dinner time is 5-6 PM , restricted fluids after 7 PM.  Bedtime is usually between 12-1 AM hours, she listens in bed to audio-books, the noise helps to drawn out the tinnitus. Preferred Sleep position is on the left, one pillow for the neck, one for the right arm.  She wakes up after 2 hours and every 3 hours to go to empty her bladder. She rises in AM at 7 in the morning, but is awake after 6 AM. TST ( total sleep time ) may be 5-6 hours.  She does feel somewhat refreshed.  She naps sometimes again by 10 AM, and more frequently after lunch and dinner.  Each about 1 hour in duration.  She has tinnitus, joint pain.    Review of Systems: Out of a complete 14 system review, the patient complains of only the following symptoms, and all other reviewed systems are negative.   How likely are you to doze in the following situations: 0 = not likely, 1 = slight chance, 2 = moderate chance, 3 = high chance  Sitting and Reading? 3 Watching Television? 3 Sitting inactive in a public place (  theater or meeting)? 3  Lying down in the afternoon when circumstances permit?3 Sitting and talking to someone? 1 Sitting quietly after lunch without alcohol?0 In a car, while stopped for a few minutes in traffic?0 As a passenger in a car for an hour without a break? 3  Total = 17/ 24  No restless legs.      Social History   Socioeconomic History  . Marital status: Widowed    Spouse name: Not on file  . Number of children: 2  . Years of education: college  . Highest education level: Master's degree (e.g., MA, MS, MEng, MEd, MSW, MBA)  Occupational History  . Occupation: Retired  Engineer, production  . Financial resource strain: Not on file  . Food insecurity:    Worry: Not on file    Inability: Not on file  . Transportation needs:     Medical: Not on file    Non-medical: Not on file  Tobacco Use  . Smoking status: Never Smoker  . Smokeless tobacco: Never Used  Substance and Sexual Activity  . Alcohol use: No  . Drug use: No  . Sexual activity: Never  Lifestyle  . Physical activity:    Days per week: Not on file    Minutes per session: Not on file  . Stress: Not on file  Relationships  . Social connections:    Talks on phone: Not on file    Gets together: Not on file    Attends religious service: Not on file    Active member of club or organization: Not on file    Attends meetings of clubs or organizations: Not on file    Relationship status: Not on file  . Intimate partner violence:    Fear of current or ex partner: Not on file    Emotionally abused: Not on file    Physically abused: Not on file    Forced sexual activity: Not on file  Other Topics Concern  . Not on file  Social History Narrative   Lives alone but her daughter lives next door.   Right-handed.   Occasional caffeine.    Family History  Problem Relation Age of Onset  . Hyperlipidemia Mother   . Heart disease Mother   . Hypertension Mother   . Mental illness Mother   . Cancer Father   . Colon cancer Father   . Alcohol abuse Brother   . Drug abuse Brother   . Cancer Brother   . Cancer Maternal Aunt   . Breast cancer Maternal Aunt   . Colon cancer Maternal Grandmother   . Esophageal cancer Neg Hx   . Inflammatory bowel disease Neg Hx   . Liver disease Neg Hx   . Pancreatic cancer Neg Hx   . Rectal cancer Neg Hx   . Stomach cancer Neg Hx     Past Medical History:  Diagnosis Date  . Arthritis   . Chicken pox   . GERD (gastroesophageal reflux disease)   . Glaucoma   . History of frequent urinary tract infections   . Hyperlipidemia   . Osteopenia   . Peripheral neuropathy   . Urine incontinence     Past Surgical History:  Procedure Laterality Date  . ABDOMINAL HYSTERECTOMY    . APPENDECTOMY    . BREAST BIOPSY    .  BUNIONECTOMY Left 1975   Left Toe  . HAMMER TOE SURGERY Right 2005   Right foot correction hammertoe little toe  . NEUROMA SURGERY  Right 2003   Right Foot Neuroma, plus Bunionectomy, Right Great Toe  . TONSILLECTOMY AND ADENOIDECTOMY      Current Outpatient Medications  Medication Sig Dispense Refill  . Ascorbic Acid (VITAMIN C) 1000 MG tablet Take 1,000 mg by mouth daily.    . betamethasone dipropionate 0.05 % lotion APP ON THE SKIN D  0  . Biotin 10 MG TABS Take by mouth.    . Calcium Citrate-Vitamin D (CALCIUM + D PO) Take by mouth daily.    . CELEBREX 200 MG capsule Take 1 capsule (200 mg total) by mouth daily as needed. 90 capsule 1  . Cholecalciferol (VITAMIN D3) 2000 units TABS Take 1 tablet by mouth daily.    . dorzolamide (TRUSOPT) 2 % ophthalmic solution 1 drop 2 (two) times daily.    . DULoxetine (CYMBALTA) 30 MG capsule TAKE 1 CAPSULE(30 MG) BY MOUTH DAILY 90 capsule 2  . GINKGO BILOBA PO Take by mouth daily.    Marland Kitchen. GLUCOSAMINE-CHONDROITIN-MSM PO Take by mouth. Take 2 oz. Daily.  Glucosamine 2,000 mg Chondroitin 1,200 mg, MSM 500 mg, Hyaluronic Acid 10 mg.    . HYPROMELLOSE OP Place into both eyes at bedtime.    . Magnesium 250 MG TABS Take 1 tablet by mouth daily.    . metroNIDAZOLE (METROGEL) 1 % gel Apply 1 application topically as needed (rosacea).    . Omega-3 Fatty Acids (FISH OIL) 1000 MG CAPS Take 1 capsule by mouth.    Marland Kitchen. omeprazole (PRILOSEC) 40 MG capsule Take 1 capsule (40 mg total) by mouth 2 (two) times daily. 60 capsule 3  . Plant Sterols and Stanols (CHOLESTOFF PLUS PO) Take 1 capsule by mouth daily.    . Polyvinyl Alcohol-Povidone (FRESHKOTE) 2.7-2 % SOLN Place 1 drop into both eyes 4 times daily.    . psyllium (METAMUCIL) 58.6 % packet Take 1 packet by mouth daily.    . Sodium Fluoride (PREVIDENT 5000 BOOSTER PLUS) 1.1 % PSTE Place onto teeth daily.    . TURMERIC CURCUMIN PO Take by mouth daily.    Marland Kitchen. UNABLE TO FIND CBD oil for pain, as needed.  Hemp flower  extract for inflammation, as needed.     No current facility-administered medications for this visit.     Allergies as of 05/06/2019 - Review Complete 05/06/2019  Allergen Reaction Noted  . Protonix [pantoprazole sodium]  08/18/2016  . Tape Other (See Comments) 11/07/2017  . Pantoprazole Rash 09/03/2016    Vitals: BP 133/81   Pulse 76   Temp (!) 97.1 F (36.2 C)   Ht 5' 3.5" (1.613 m)   Wt 165 lb (74.8 kg)   BMI 28.77 kg/m  Last Weight:  Wt Readings from Last 1 Encounters:  05/06/19 165 lb (74.8 kg)   Last Height:   Ht Readings from Last 1 Encounters:  05/06/19 5' 3.5" (1.613 m)    Physical exam:  General: The patient is awake, alert and appears not in acute distress.  The patient is well groomed. Head: Normocephalic, atraumatic. Neck is supple. Mallampati 4, neck circumference: 15"  Cardiovascular:  Regular rate and rhythm, without  murmurs or carotid bruit, and without distended neck veins. Respiratory: Lungs are clear to auscultation. Skin:  Without evidence of edema, or rash Trunk: BMI is 28, slightly  elevated and patient  has normal posture.  Neurologic exam : The patient is awake and alert, oriented to place and time.   Memory subjective described as intact.  There is a normal attention span &  concentration ability. Speech is fluent without  dysarthria, dysphonia or aphasia. Mood and affect are appropriate.  Cranial nerves: Pupils are equal and briskly reactive to light.  She has new lenses, had cataract surgery. Funduscopic exam deferred.   Extraocular movements  in vertical and horizontal planes intact and without nystagmus.  Visual fields by finger perimetry are intact. Hearing to finger rub intact.  Facial sensation intact to fine touch. Facial motor strength is symmetric and tongue and uvula move midline. Tongue protrusion into either cheek is normal. Shoulder shrug is normal.   Motor exam: loss of thenar eminence bilaterally.  Normal tone ,muscle bulk  and symmetric  strength in all extremities. ROM of her fingers affected by arthritus,.   Sensory:  Fine touch, pinprick and vibration were tested in upper extremities. Numbness in both legs. Proprioception was normal.  Coordination: Rapid alternating movements in the fingers/hands were normal. Finger-to-nose maneuver  normal without evidence of ataxia, dysmetria or tremor.  Gait and station: Patient walks without assistive device - she walks slowly but not stooped, no hand tremor, normal arm swing.  Deep tendon reflexes: in the upper and lower extremities are symmetric and intact.   Assessment:  After physical and neurologic examination, review of laboratory studies, imaging, neurophysiology testing and pre-existing records, assessment is that of :  1) fatigue, high degree   2) excessive daytime sleepiness,   3) wakes with a dry mouth, coughing. She was witnessed to snore by her daughter on travels.   4) son has OSA and daughter's grandson has autism spectrum with PLMs.     Plan:  Treatment plan and additional workup :  working up for sleep apnea, nocturia and EDS.  I would much prefer an attended sleep study to see, if her TST is even 5 hours long, and would like to offer her to sleep an hour longer , up to 6.30 AM.     Melvyn Novas MD 05/06/2019

## 2019-05-13 ENCOUNTER — Telehealth: Payer: Self-pay

## 2019-05-13 ENCOUNTER — Ambulatory Visit: Payer: Medicare Other | Attending: Neurology | Admitting: Physical Therapy

## 2019-05-13 ENCOUNTER — Other Ambulatory Visit: Payer: Self-pay

## 2019-05-13 DIAGNOSIS — M6281 Muscle weakness (generalized): Secondary | ICD-10-CM | POA: Diagnosis present

## 2019-05-13 DIAGNOSIS — R2681 Unsteadiness on feet: Secondary | ICD-10-CM | POA: Insufficient documentation

## 2019-05-13 DIAGNOSIS — R2689 Other abnormalities of gait and mobility: Secondary | ICD-10-CM | POA: Diagnosis not present

## 2019-05-13 NOTE — Telephone Encounter (Signed)
I called and left a message for patient making her aware that we are ready to reschedule her NCV/EMG with Dr. Krista Blue. I asked that she call us back at her convenience to schedule.

## 2019-05-13 NOTE — Patient Instructions (Signed)
SINGLE LIMB STANCE    Stance: single leg on floor. Raise leg. Hold _10__ seconds. Repeat with other leg. _2__ reps per set, 2-3  sets per day, _5__ days per week  DO EXERCISE STANDING AT COUNTER OR OBJECT FOR SUPPORT AS NEEDED

## 2019-05-14 ENCOUNTER — Encounter: Payer: Self-pay | Admitting: Physical Therapy
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
## 2019-05-28 ENCOUNTER — Other Ambulatory Visit: Payer: Self-pay

## 2019-05-28 ENCOUNTER — Ambulatory Visit: Payer: Medicare Other | Admitting: Physical Therapy

## 2019-05-28 DIAGNOSIS — R2689 Other abnormalities of gait and mobility: Secondary | ICD-10-CM

## 2019-05-28 DIAGNOSIS — R2681 Unsteadiness on feet: Secondary | ICD-10-CM

## 2019-05-28 DIAGNOSIS — M6281 Muscle weakness (generalized): Secondary | ICD-10-CM

## 2019-05-29 ENCOUNTER — Other Ambulatory Visit: Payer: Self-pay
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
## 2019-05-31 ENCOUNTER — Telehealth: Payer: Self-pay | Admitting: Gastroenterology

## 2019-05-31 ENCOUNTER — Ambulatory Visit
Admission: RE | Admit: 2019-05-31 | Discharge: 2019-05-31 | Disposition: A | Discharge status: Home / Self Care | Payer: Medicare Other | Source: Ambulatory Visit | Attending: Gastroenterology | Admitting: Gastroenterology

## 2019-05-31 DIAGNOSIS — R1030 Lower abdominal pain, unspecified: Secondary | ICD-10-CM

## 2019-05-31 IMAGING — CT CT ABDOMEN AND PELVIS WITH CONTRAST
2 of 5 series · 16 of 46 positions shown, 18 images · IV contrast (iopamidol)
Comparison: [DATE]

CLINICAL DATA: Lower abdominal pain for several days

EXAM:
CT ABDOMEN AND PELVIS WITH CONTRAST
TECHNIQUE: Multidetector CT imaging of the abdomen and pelvis was performed
using the standard protocol following bolus administration of
intravenous contrast.
CONTRAST:  100mL [1Z] IOPAMIDOL ([1Z]) INJECTION 61%

[Series 2: abd pelvis 5.00 br40 s3 axial · axial · 0.68mm/px · z∈[+1387,+1732]mm · 13 of 79 slices shown, 15 images]
[im 5/79  soft-tissue]
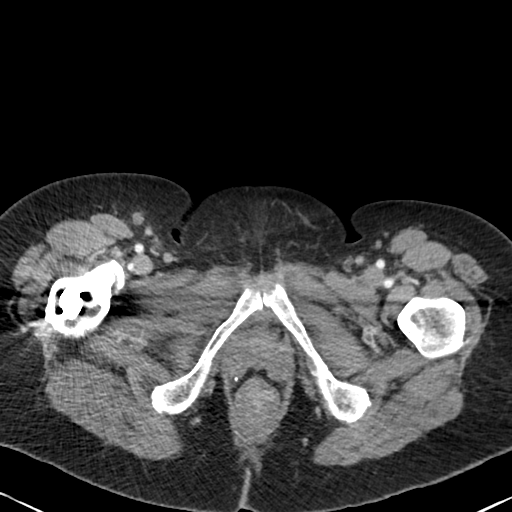
[im 5/79  bone]
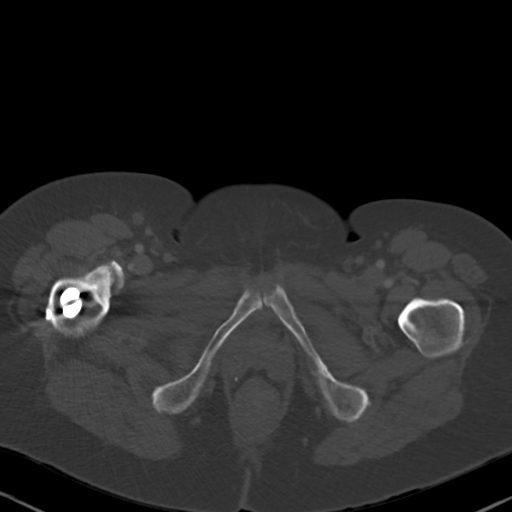
[im 9/79  soft-tissue]
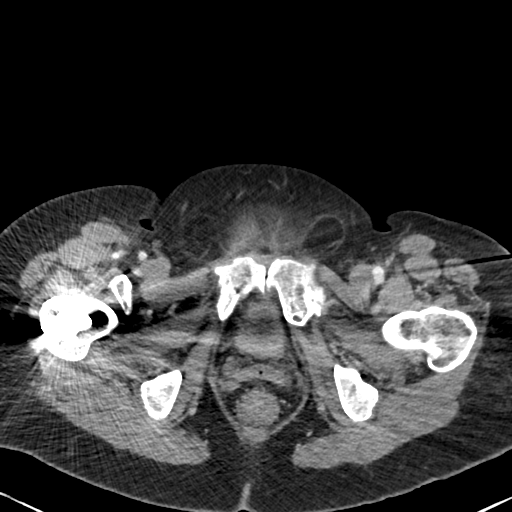
[im 18/79  soft-tissue]
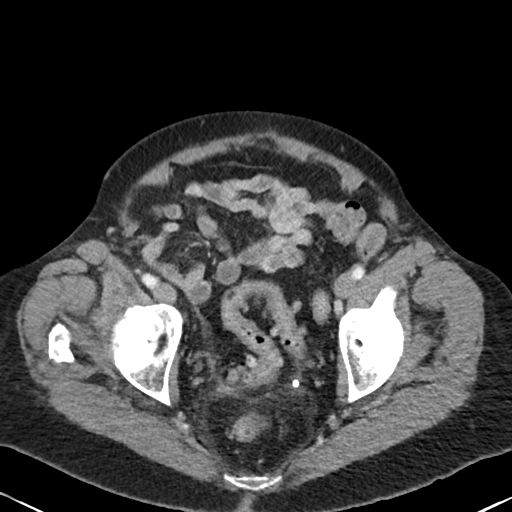
[im 22/79  soft-tissue]
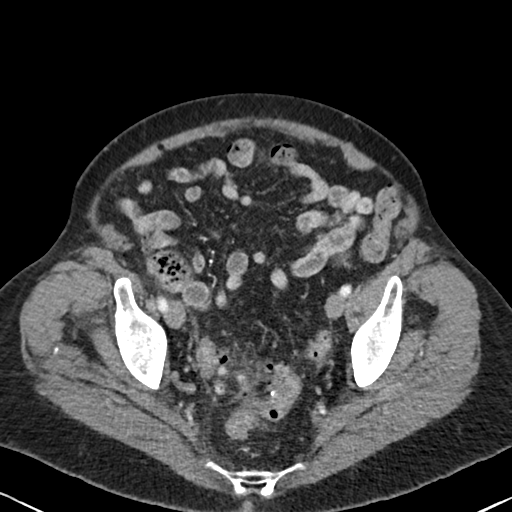
[im 27/79  soft-tissue]
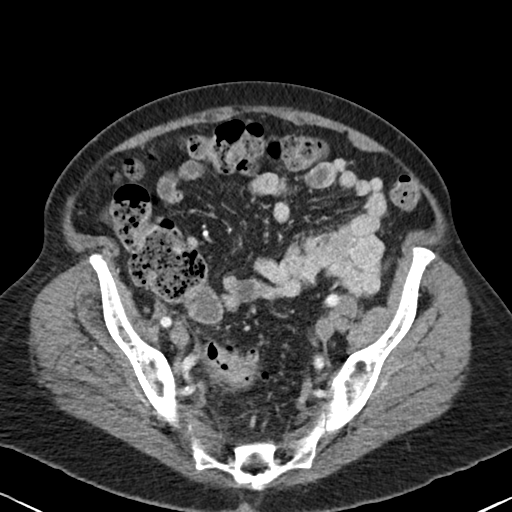
[im 35/79  soft-tissue]
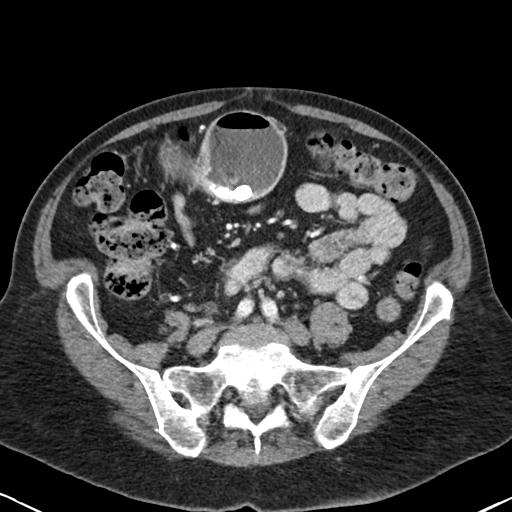
[im 40/79  soft-tissue]
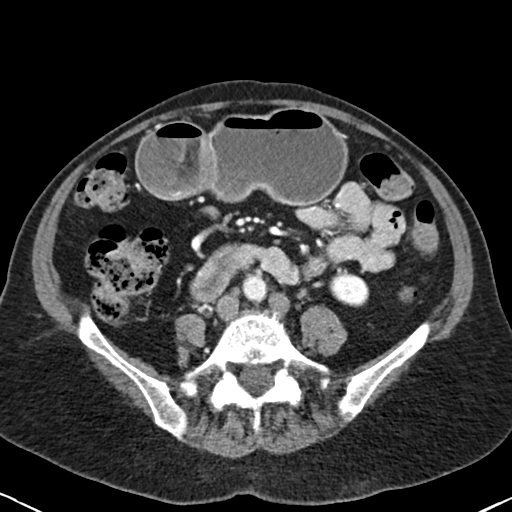
[im 44/79  soft-tissue]
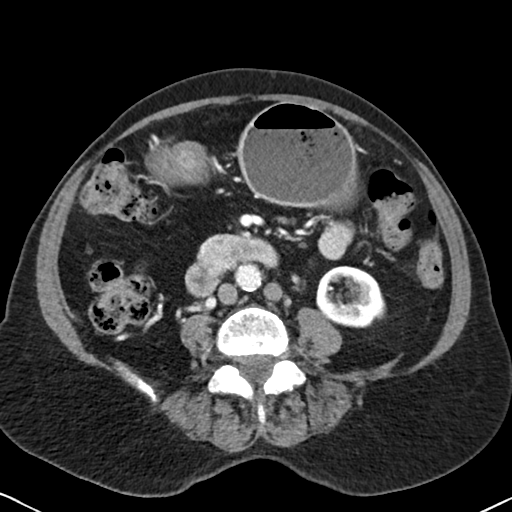
[im 53/79  soft-tissue]
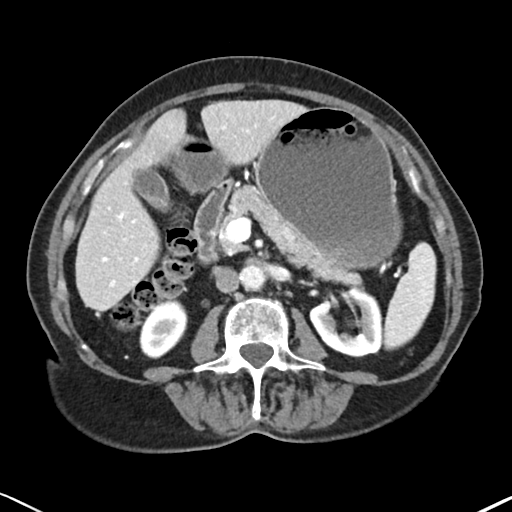
[im 53/79  bone]
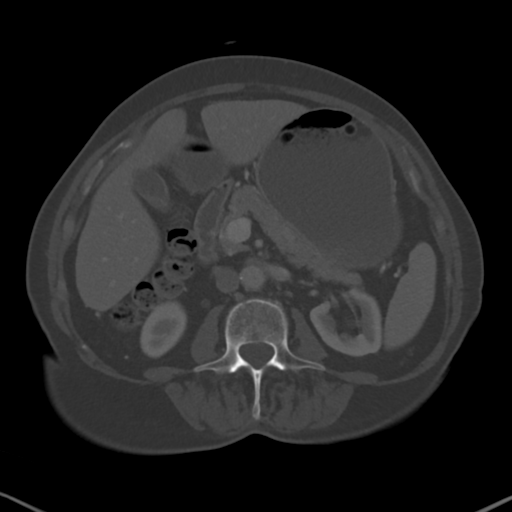
[im 57/79  soft-tissue]
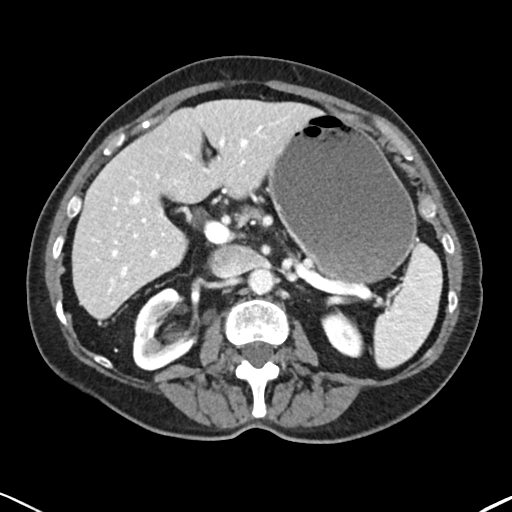
[im 61/79  soft-tissue]
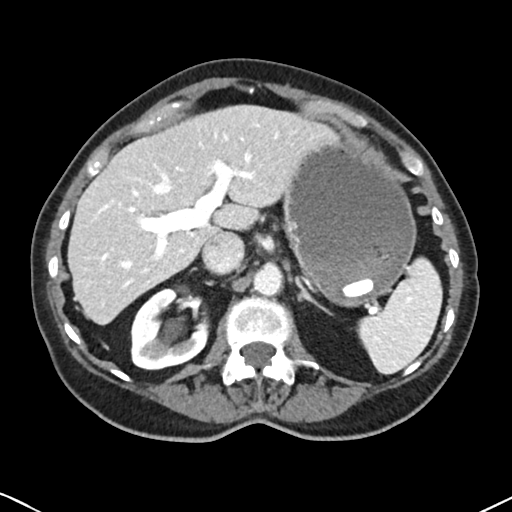
[im 70/79  soft-tissue]
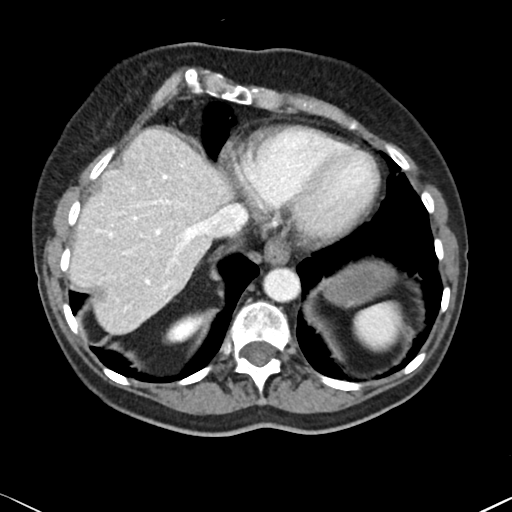
[im 74/79  soft-tissue]
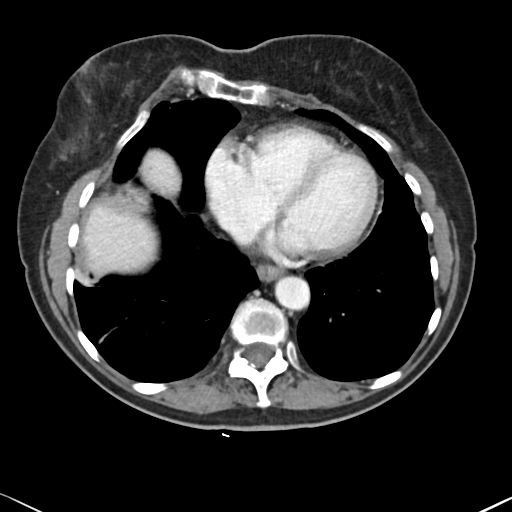

[Series 6: abd pelvis 2.00 br40 s3 cor · coronal · 0.68mm/px · 3 of 161 slices shown]
[im 54/161  soft-tissue]
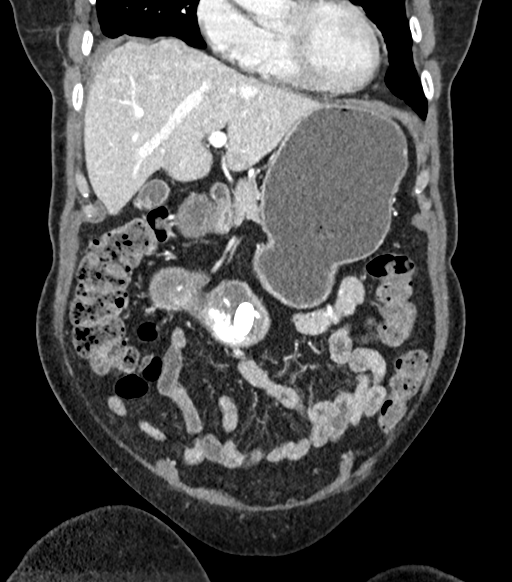
[im 72/161  soft-tissue]
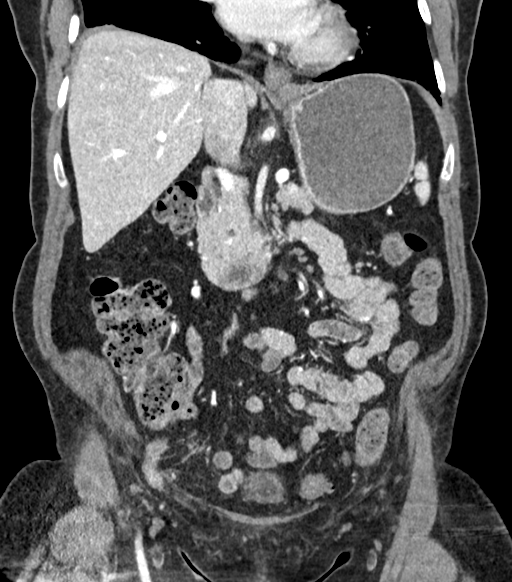
[im 89/161  soft-tissue]
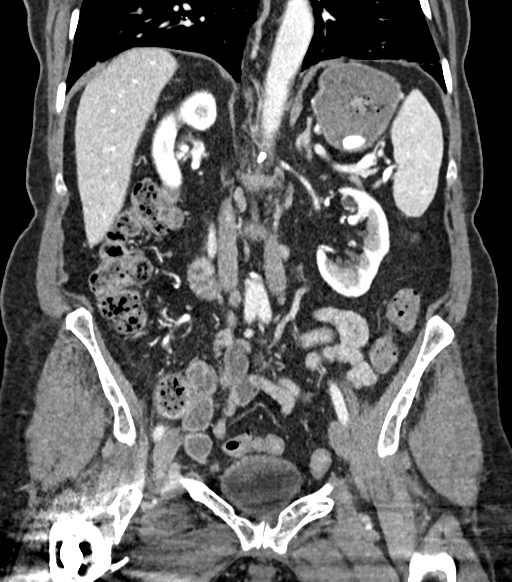

[16 of 46 positions shown; findings below may reference images not displayed]

FINDINGS: Lower chest: Stable bibasilar scarring.

Hepatobiliary: Small hypodense lesions in the lateral segment left
hepatic lobe are stable and most likely to be benign cyst but
technically nonspecific due to small size. Small gallstones in the
gallbladder are again identified. No significant biliary dilatation.

Pancreas: Unremarkable

Spleen: Unremarkable

Adrenals/Urinary Tract: Peripelvic cysts bilaterally. Adrenal glands
normal.

Stomach/Bowel: Sigmoid diverticulosis with acute diverticulitis of
the distal sigmoid colon as shown for example on image 58/2. There
surrounding inflammatory stranding but no significant extraluminal
gas or abscess.

Vascular/Lymphatic: Aortoiliac atherosclerotic vascular disease.
Infrarenal double IVC, a venous variant. Small retroperitoneal and
pelvic lymph nodes are present and some of these may be reactive,
but there is no overtly pathologic adenopathy.

Reproductive: Uterus absent.  Adnexa unremarkable.

Other: No supplemental non-categorized findings.

Musculoskeletal: Old deformities of the pubic rami compatible with
remote fractures. Prior right hip ORIF. Old healed right sacral
fracture. The anterior sternum is tilted to the left.

Degenerative loss of intervertebral disc height at L4-5. Small
umbilical hernia contains adipose tissue. Small bilateral groin
hernias contain adipose tissue.
IMPRESSION: 1. Mild acute diverticulitis of the distal sigmoid colon. No
extraluminal gas or abscess identified.
2.  Aortic Atherosclerosis ([1Z]-[1Z]).
3. Stable pelvic deformities from old fractures.
4. Infrarenal double IVC, a venous variant.
5. Cholelithiasis.

## 2019-05-31 MED ORDER — METRONIDAZOLE 250 MG PO TABS: 250.0000 mg | ORAL_TABLET | Freq: Three times a day (TID) | ORAL | 0 refills | Status: DC

## 2019-05-31 MED ORDER — METRONIDAZOLE 500 MG PO TABS: 500.0000 mg | ORAL_TABLET | Freq: Two times a day (BID) | ORAL | 0 refills | Status: AC

## 2019-05-31 MED ORDER — DICYCLOMINE HCL 10 MG PO CAPS: 20.0000 mg | ORAL_CAPSULE | Freq: Three times a day (TID) | ORAL | 3 refills | Status: DC

## 2019-05-31 MED ORDER — CIPROFLOXACIN HCL 500 MG PO TABS: 500.0000 mg | ORAL_TABLET | Freq: Two times a day (BID) | ORAL | 0 refills | Status: DC

## 2019-05-31 MED ORDER — IOPAMIDOL (ISOVUE-300) INJECTION 61%
100.0000 mL | Freq: Once | INTRAVENOUS | Status: AC | PRN
  Administered 2019-05-31: 100 mL via INTRAVENOUS

## 2019-05-31 NOTE — Telephone Encounter (Signed)
Spent 20 minutes on phone with patient this afternoon.  Patient states that pain began on Tuesday and has been progressive and worsened over the course the last few days.  Initially was in the lower abdomen but feels more like suprapubic to right lower quadrant at this point.  Every time she goes to have a bowel movement now her caliber of stool has decreased in diameter.  She has been still taking Metamucil and MiraLAX 2 times a day but overall has had less bowel movements.  The pain is crampy in nature.  She recalls potentially being diagnosed with diverticulitis but not imaging directed diagnosis years and years ago and was treated with antibiotics.  She has not required antibiotics since.  She does not have any prior diverticulitis bouts that have ever been found on imaging.  Her last colonoscopy as reviewed previously in records had suggested diverticulosis on the left side.  She denies any blood in the stools but may have seen some mucus today.  No rectal pain or tenesmus. We need to consider the small possibility of diverticulitis.  Right lower quadrant inguinal hernia discomfort seems to be less of an issue especially being a fatty inguinal hernia rather than a significant amount of small bowel or large bowel but something we should also consider in our differential.  She may just be having spasms as a result of developing constipation and may need more aggressive bowel regimen.  However, the patient is not someone who usually calls or has significant burden to call her providers and so this discomfort is significant enough for her at this point in time that I think further evaluation is reasonable.  I have asked her to stay very well-hydrated.  Okay to continue a healthy diet that would be considered heart healthy and minimize any fatty food or greasy intake.  Plan I would like to proceed with ordering a CT abdomen/pelvis with IV and oral contrast to be done on an urgent basis. I would like to  initiate ciprofloxacin 500 twice daily x10 days and Flagyl 500 twice daily x10 days. I would like her to go ahead and initiate that while we await the results of the CT scan being completed. I would like the patient to also be initiated on Bentyl 20 mg 3 times daily as needed. If the patient's discomfort progresses significantly over the course of the weekend before we can get the CT scan completed she will require evaluation in the emergency department. Hopefully, this is not diverticulitis and we can stop her antibiotics relatively soon after the CT scan is completed. Patty, please reach out to the patient and initiate the above.

## 2019-05-31 NOTE — Telephone Encounter (Signed)
The pt will go to [address] [place] now for CT scan  Prescription has been sent to the pharmacy.    The patient has been notified of this information and all questions answered.  The pt has been advised of the information and verbalized understanding.

## 2019-05-31 NOTE — Addendum Note (Signed)
Addended by: Justice Britain on: 05/31/2019 05:27 PM   Modules accepted: Orders

## 2019-05-31 NOTE — Telephone Encounter (Signed)
Lower abd pain, small caliber stool, soft stool, no blood or mucous.  Has some bloating.  Taking metamucil daily, also 1 capful miralax twice daily.  She has not changed her diet.  She has a history of constipation and does not think she is constipated but is not having normal bowel movements.  She did send a My Chart message that states she lifted a heavy box and is unsure if she injured the inguinal hernia noted on CT.  She also tells me she is very active and swims several times a week, eats healthy and drinks plenty of water. Please advise

## 2019-05-31 NOTE — Telephone Encounter (Signed)
I called and spoke with the patient about the results of the CT scan she had done. Results will be released on my chart as well. Patient has active diverticulitis on cross-sectional imaging in the sigmoid. She is picked up Cipro but for some reason has not been able to pick up Flagyl. I will send a new prescription for Flagyl 500 twice daily. She will initiate her antibiotics and will monitor symptoms over the course of the weekend. Fiber supplementation should be stopped right now to allow things to pass and not build up. She can reinitiate that once her antibiotics are over. We will have advanced RN Gerarda Fraction reach out to patient on Monday to check in on her. She knows that if things worsen over the weekend to call covering MD in case she needs to be evaluated in the ED, I think this is very unlikely. Patient appreciative for the call back.  Justice Britain, MD [facility] Office # [phone]

## 2019-06-02 ENCOUNTER — Telehealth: Payer: Self-pay | Admitting: Gastroenterology

## 2019-06-02 NOTE — Telephone Encounter (Signed)
Patient called  Has acute diverticulitis-being treated with Cipro and Flagyl.  Has been having neuropathy/tendon pain.  Read S/Es of Cipro.  Very much concerned about those.  Wants to discontinue.  Has taken amoxicillin in the past.  Plan: -Stop Cipro and Flagyl. -Start Augmentin 875 twice daily x 10 days.  Chong Sicilian, can you call her Monday.  RG

## 2019-06-03 NOTE — Telephone Encounter (Signed)
The pt will call back to set up follow up appt when schedule is open

## 2019-06-03 NOTE — Telephone Encounter (Signed)
The pt states she feels better and will call back if her symptoms worsen.  She is taking augmentin 875 mg BID x 10 days.

## 2019-06-03 NOTE — Telephone Encounter (Signed)
Merrie Roof, Thank you for update. GM

## 2019-06-03 NOTE — Telephone Encounter (Signed)
Patty, thank you for the update.  Let us plan on having her seen in clinic in a month.  Please let her know that we will also plan to repeat a CT scan after 6 to 8 weeks to ensure that the diverticulitis is improved.  When we see her in clinic we will discuss considering an earlier colonoscopy.  Thank you. GM

## 2019-06-06 ENCOUNTER — Encounter

## 2019-06-07 ENCOUNTER — Other Ambulatory Visit: Payer: Self-pay | Admitting: Gastroenterology

## 2019-06-10 ENCOUNTER — Ambulatory Visit: Payer: Medicare Other | Admitting: Physical Therapy

## 2019-06-11 ENCOUNTER — Encounter: Payer: Self-pay | Admitting: Sports Medicine

## 2019-06-11 ENCOUNTER — Other Ambulatory Visit: Payer: Self-pay

## 2019-06-11 ENCOUNTER — Ambulatory Visit: Payer: Medicare Other | Admitting: Sports Medicine

## 2019-06-11 VITALS — Temp 97.6°F

## 2019-06-11 DIAGNOSIS — M792 Neuralgia and neuritis, unspecified: Secondary | ICD-10-CM

## 2019-06-11 DIAGNOSIS — M779 Enthesopathy, unspecified: Secondary | ICD-10-CM

## 2019-06-11 DIAGNOSIS — R200 Anesthesia of skin: Secondary | ICD-10-CM

## 2019-06-11 DIAGNOSIS — M79674 Pain in right toe(s): Secondary | ICD-10-CM

## 2019-06-11 DIAGNOSIS — M2040 Other hammer toe(s) (acquired), unspecified foot: Secondary | ICD-10-CM

## 2019-06-12 ENCOUNTER — Encounter: Payer: Self-pay | Admitting: Sports Medicine

## 2019-06-12 DIAGNOSIS — M779 Enthesopathy, unspecified: Secondary | ICD-10-CM | POA: Diagnosis not present

## 2019-06-12 MED ORDER — TRIAMCINOLONE ACETONIDE 10 MG/ML IJ SUSP
10.0000 mg | Freq: Once | INTRAMUSCULAR | Status: AC
  Administered 2019-06-12: 10 mg

## 2019-06-12 NOTE — Progress Notes (Signed)
Subjective: Hailey Perkins is a 84 y.o. female patient who presents to office for evaluation of right foot pain. Patient complains of progressive pain especially over the last 2 weeks consisting of severe burning pain worse at nights at the fourth and fifth toes.  Patient has tried sleeping and socks to prevent rubbing to the toes.  Patient admits to a history of neuropathy but reports that the pain to the fourth and fifth toes is new in nature and has not been getting better.  Patient denies any other pedal complaints. Denies injury/trip/fall/sprain/any causative factors.   Patient Active Problem List   Diagnosis Date Noted  . Cerebellar 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 ([place]) 05/06/2019  . Excessive daytime sleepiness 05/06/2019  . Loud snoring 05/06/2019  . Ventral hernia without obstruction or gangrene 02/22/2019  . Chronic throat clearing 01/18/2019  . Nocturnal cough 01/18/2019  . Family history of colon cancer in father 01/18/2019  . Diverticulosis 01/18/2019  . Gait abnormality 12/27/2018  . Numbness 12/27/2018  . Status post total bilateral knee replacement 11/30/2018  . DDD (degenerative disc disease), cervical 11/30/2018  . Status post carpal tunnel release 11/30/2018  . DDD (degenerative disc disease), lumbar 10/26/2018  . Primary osteoarthritis of both hands 10/26/2018  . Primary osteoarthritis of both feet 10/26/2018  . Gastroesophageal reflux disease 08/27/2018  . Bilateral impacted cerumen 08/21/2018  . Sensorineural hearing loss (SNHL) of both ears 08/21/2018  . Tinnitus, bilateral 08/21/2018  . Blood in urine 04/17/2018  . Secondary open-angle glaucoma of left eye, moderate stage 03/10/2018  . Early stage 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 10/05/2017  . Epiretinal membrane (ERM) of left eye 10/05/2017  . Pseudophakia of both eyes 10/05/2017  . Osteopenia 03/19/2017  . Mixed hyperlipidemia 08/18/2016  . Generalized osteoarthritis of  multiple sites 08/18/2016  . Peripheral neuropathic pain 08/18/2016  . Overactive bladder 08/18/2016  . Glaucoma 08/18/2016  . Cervical disc disorder with radiculopathy of cervical region 08/17/2016  . Carpal tunnel syndrome, right upper limb 08/17/2016  . Abnormal finding on mammography 06/14/2016  . Breast lump 05/06/2015    Current Outpatient Medications on File Prior to Visit  Medication Sig Dispense Refill  . amoxicillin-clavulanate (AUGMENTIN) 875-125 MG tablet Take 1 tablet by mouth 2 (two) times daily.    . Ascorbic Acid (VITAMIN C) 1000 MG tablet Take 1,000 mg by mouth daily.    . betamethasone dipropionate 0.05 % lotion APP ON THE SKIN D  0  . Biotin 10 MG TABS Take by mouth.    . Calcium Citrate-Vitamin D (CALCIUM + D PO) Take by mouth daily.    . CELEBREX 200 MG capsule Take 1 capsule (200 mg total) by mouth daily as needed. 90 capsule 1  . Cholecalciferol (VITAMIN D3) 2000 units TABS Take 1 tablet by mouth daily.    . ciprofloxacin (CIPRO) 500 MG tablet Take 1 tablet (500 mg total) by mouth 2 (two) times daily. 20 tablet 0  . dicyclomine (BENTYL) 10 MG capsule TAKE 2 CAPSULES (20 MG TOTAL) BY MOUTH 4 (FOUR) TIMES DAILY - BEFORE MEALS AND AT BEDTIME. 90 capsule 3  . dorzolamide (TRUSOPT) 2 % ophthalmic solution 1 drop 2 (two) times daily.    . DULoxetine (CYMBALTA) 30 MG capsule TAKE 1 CAPSULE(30 MG) BY MOUTH DAILY 90 capsule 2  . GINKGO BILOBA PO Take by mouth daily.    Marland Kitchen GLUCOSAMINE-CHONDROITIN-MSM PO Take by mouth. Take 2 oz. Daily.  Glucosamine 2,000 mg Chondroitin 1,200 mg, MSM 500  mg, Hyaluronic Acid 10 mg.    . HYPROMELLOSE OP Place into both eyes at bedtime.    . Magnesium 250 MG TABS Take 1 tablet by mouth daily.    . metroNIDAZOLE (FLAGYL) 250 MG tablet TAKE 1 TABLET (250 MG TOTAL) BY MOUTH 3 (THREE) TIMES DAILY.    . metroNIDAZOLE (METROGEL) 1 % gel Apply 1 application topically as needed (rosacea).    . Omega-3 Fatty Acids (FISH OIL) 1000 MG CAPS Take 1 capsule  by mouth.    Marland Kitchen. omeprazole (PRILOSEC) 40 MG capsule Take 1 capsule (40 mg total) by mouth daily. 60 capsule 3  . Plant Sterols and Stanols (CHOLESTOFF PLUS PO) Take 1 capsule by mouth daily.    . Polyvinyl Alcohol-Povidone (FRESHKOTE) 2.7-2 % SOLN Place 1 drop into both eyes 4 times daily.    . psyllium (METAMUCIL) 58.6 % packet Take 1 packet by mouth daily.    . Sodium Fluoride (PREVIDENT 5000 BOOSTER PLUS) 1.1 % PSTE Place onto teeth daily.    . TURMERIC CURCUMIN PO Take by mouth daily.    Marland Kitchen. UNABLE TO FIND CBD oil for pain, as needed.  Hemp flower extract for inflammation, as needed.     No current facility-administered medications on file prior to visit.     Allergies  Allergen Reactions  . Protonix [Pantoprazole Sodium]     Pt stated, "Blood pin-point rash"  . Tape Other (See Comments)    Adhesive Tape; pt stated "itch and redness" Adhesive Tape; pt stated "itch and redness"  . Pantoprazole Rash    Objective:  General: Alert and oriented x3 in no acute distress  Dermatology: No open lesions bilateral lower extremities, no webspace macerations, no ecchymosis bilateral, all nails x 10 are well manicured.  Vascular: Dorsalis Pedis and Posterior Tibial pedal pulses palpable.  Neurology: Gross sensation intact via light touch bilateral, Protective sensation intact  with Phoebe PerchSemmes Weinstein Monofilament to all pedal sites, subjective burning pain to the right fourth and fifth toes.  Negative Mulder sign on right.  Musculoskeletal: Mild tenderness with palpation at right fourth and fifth toes.  Severe bunion and hammertoe deformity.  Assessment and Plan: Problem List Items Addressed This Visit      Other   Numbness    Other Visit Diagnoses    Capsulitis    -  Primary   Relevant Medications   triamcinolone acetonide (KENALOG) 10 MG/ML injection 10 mg (Completed) (Start on 06/12/2019  7:15 AM)   Neuritis       Relevant Medications   triamcinolone acetonide (KENALOG) 10 MG/ML  injection 10 mg (Completed) (Start on 06/12/2019  7:15 AM)   Toe pain, right       Relevant Medications   triamcinolone acetonide (KENALOG) 10 MG/ML injection 10 mg (Completed) (Start on 06/12/2019  7:15 AM)   Hammer toe, unspecified laterality           -Complete examination performed -Discussed treatement options for likely neuritis versus capsulitis -After oral consent and aseptic prep, injected a mixture containing 1 ml of 2%  plain lidocaine, 1 ml 0.5% plain marcaine, 0.5 ml of kenalog 10 and 0.5 ml of dexamethasone phosphate into right fourth and fifth toes via interspace without complication. Post-injection care discussed with patient.  -Advised patient to rest ice elevate and may use topical pain creams or rubs -Continue with nerve medicine that she is already on Cymbalta -Continue with good supportive shoes and metatarsal padding and avoid using digital spacers and separators at the fourth  and fifth toes on the right until symptoms are better -Patient to return to office if pain is not better after 2 weeks or sooner if condition worsens.  Asencion Islamitorya Javell Blackburn, DPM

## 2019-06-14 ENCOUNTER — Encounter

## 2019-06-17 ENCOUNTER — Other Ambulatory Visit: Payer: Self-pay | Admitting: Gastroenterology

## 2019-06-18 ENCOUNTER — Other Ambulatory Visit: Payer: Self-pay

## 2019-06-18 ENCOUNTER — Ambulatory Visit: Payer: Medicare Other | Attending: Neurology | Admitting: Physical Therapy

## 2019-06-18 DIAGNOSIS — R2681 Unsteadiness on feet: Secondary | ICD-10-CM | POA: Diagnosis present

## 2019-06-18 DIAGNOSIS — R2689 Other abnormalities of gait and mobility: Secondary | ICD-10-CM | POA: Insufficient documentation

## 2019-06-18 DIAGNOSIS — M6281 Muscle weakness (generalized): Secondary | ICD-10-CM | POA: Diagnosis present

## 2019-06-19 ENCOUNTER — Ambulatory Visit (INDEPENDENT_AMBULATORY_CARE_PROVIDER_SITE_OTHER): Payer: Medicare Other | Admitting: Neurology

## 2019-06-19 ENCOUNTER — Telehealth: Payer: Self-pay | Admitting: Rheumatology

## 2019-06-19 ENCOUNTER — Encounter: Payer: Medicare Other | Admitting: Neurology

## 2019-06-19 DIAGNOSIS — Z0289 Encounter for other administrative examinations: Secondary | ICD-10-CM

## 2019-06-19 DIAGNOSIS — G3281 Cerebellar 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: Secondary | ICD-10-CM | POA: Diagnosis not present

## 2019-06-19 DIAGNOSIS — R269 Unspecified abnormalities of gait and mobility: Secondary | ICD-10-CM | POA: Diagnosis not present

## 2019-06-19 DIAGNOSIS — R200 Anesthesia of skin: Secondary | ICD-10-CM | POA: Diagnosis not present

## 2019-06-19 NOTE — Telephone Encounter (Signed)
Attempted to contact patient and left message for patient to call the office.  

## 2019-06-19 NOTE — Telephone Encounter (Signed)
Patient left a message stating she is having trouble sleeping due to rt toe burning. Please call to discuss.
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
## 2019-06-20 ENCOUNTER — Ambulatory Visit: Payer: Medicare Other | Admitting: Physical Therapy

## 2019-06-20 NOTE — Telephone Encounter (Signed)
It is difficult for me to make a decision without examining the patient.  Patient has history of osteoarthritis and neuropathy.  She does not have an autoimmune disease.  She may see her PCP for evaluation.

## 2019-06-20 NOTE — Telephone Encounter (Signed)
Patient states she is having pain in her fourth and fifth toes on her right foot. Patient states it is a burning pain and states she is having trouble sleeping because of it. Patient states she has checked with her neurologist and he does not feel like this is related to anything he is treating. Patient was last seen 10/26/18. Please advise.

## 2019-06-20 NOTE — Telephone Encounter (Signed)
Patient advised she has history of osteoarthritis and neuropathy. She does not have an autoimmune disease.  She may see her PCP for evaluation.

## 2019-06-24 ENCOUNTER — Telehealth: Payer: Self-pay | Admitting: Neurology

## 2019-06-24 NOTE — Telephone Encounter (Signed)
[employer] medicare pending faxed notes 

## 2019-06-25 ENCOUNTER — Ambulatory Visit (INDEPENDENT_AMBULATORY_CARE_PROVIDER_SITE_OTHER): Payer: Medicare Other | Admitting: Neurology

## 2019-06-25 ENCOUNTER — Ambulatory Visit: Payer: Medicare Other | Admitting: Physical Therapy

## 2019-06-25 DIAGNOSIS — G4719 Other hypersomnia: Secondary | ICD-10-CM

## 2019-06-25 DIAGNOSIS — N3281 Overactive bladder: Secondary | ICD-10-CM

## 2019-06-25 DIAGNOSIS — G4733 Obstructive sleep apnea (adult) (pediatric): Secondary | ICD-10-CM | POA: Diagnosis not present

## 2019-06-25 DIAGNOSIS — M5010 Cervical disc disorder with radiculopathy, unspecified cervical region: Secondary | ICD-10-CM

## 2019-06-25 DIAGNOSIS — G4734 Idiopathic sleep related nonobstructive alveolar hypoventilation: Secondary | ICD-10-CM

## 2019-06-25 DIAGNOSIS — G3281 Cerebellar 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: Secondary | ICD-10-CM

## 2019-06-25 DIAGNOSIS — M792 Neuralgia and neuritis, unspecified: Secondary | ICD-10-CM

## 2019-06-26 ENCOUNTER — Ambulatory Visit: Payer: Medicare Other | Admitting: Physical Therapy

## 2019-06-26 ENCOUNTER — Encounter: Payer: Self-pay | Admitting: Neurology

## 2019-06-27 NOTE — Telephone Encounter (Signed)
I was unable to get the MRI approved on my level. I spoke to a Geologist, engineering and she was not able to get it approved on her level. The phone number for the peer to peer is [phone]. The member ID is [id number] & DOB is 1935/11/16. The case does close on Wednesday 07/03/19.

## 2019-06-27 NOTE — Telephone Encounter (Addendum)
Unable to complete today due to patient schedule.  We will complete on 07/01/2019.

## 2019-06-28 ENCOUNTER — Encounter

## 2019-07-01 ENCOUNTER — Encounter: Payer: Self-pay | Admitting: Sports Medicine

## 2019-07-01 NOTE — Addendum Note (Signed)
Addended by: Larey Seat on: 07/01/2019 01:14 PM   Modules accepted: Orders

## 2019-07-01 NOTE — Telephone Encounter (Signed)
MRI cervical spine has also been approved, after physician peer-to-peer review with Dr. Krista Blue.  Approval: [id number].

## 2019-07-01 NOTE — Telephone Encounter (Signed)
Noted, thank you I faxed the order to [facility] they will reach out to the patient to schedule,.

## 2019-07-01 NOTE — Procedures (Signed)
PATIENT'S NAME:  Hailey Perkins, Hailey Perkins DOB:      09-14-1935      MR#:    [id number]     DATE OF RECORDING: 06/25/2019  AL REFERRING M.D.:  Jarome Matinaniel Paterson MD Study Performed:   Baseline Polysomnogram HISTORY:  Hailey Perkins is a 84 y.o. female patient seen here face to face on 05-06-2019 upon referral from Dr. Eloise HarmanPaterson for a sleep evaluation. The chief compliant is fatigue.  The patient has already been [facility], and addressed memory problems, gait problems. Here is a long list of her medical diagnoses.  A brain MRI had been obtained upon Dr Zannie CoveYan's orders, on 03-13-2019. This was read as normal and discussed with the patient.     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 Widowed. Adult children.     Sleep habits: dinner time is 5-6 PM, restricted fluids after 7 PM.  Bedtime is usually between 12-1 AM hours, she listens in bed to audio-books, the noise helps to drawn out the tinnitus. She wakes up after 2 hours and every 3 hours to go to empty her bladder. She rises in AM at 7 in the morning, but is awake after 6 AM. TST (total sleep time) may be 5-6 hours.  She does feel somewhat refreshed.  She naps sometimes again by 10 AM, and more frequently after lunch and dinner  The patient endorsed the Epworth Sleepiness Scale at 17/24 points.   The patient's weight 165 pounds with a height of 63 (inches), resulting in a BMI of 29.3 kg/m2. The patient's neck circumference measured 15 inches.  CURRENT MEDICATIONS: Trusopt, Cymbalta, Mitrogel, Prilosec   PROCEDURE:  This is a multichannel digital polysomnogram utilizing the Somnostar 11.2 system.  Electrodes and sensors were applied and monitored per AASM Specifications.   EEG, EOG, Chin and Limb EMG, were sampled at 200 Hz.  ECG, Snore and Nasal Pressure, Thermal Airflow, Respiratory Effort, CPAP Flow and Pressure, Oximetry was sampled at 50 Hz. Digital video and audio were recorded.      BASELINE STUDY: Lights Out  was at 21:39 and Lights On at 05:01.  Total recording time (TRT) was 442.5 minutes, with a total sleep time (TST) of 388.5 minutes.   The patient's sleep latency was 16.5 minutes.  REM latency was 89.5 minutes.  The sleep efficiency was 87.8 %.     SLEEP ARCHITECTURE: WASO (Wake after sleep onset) was 36.5 minutes.  There were 47.5 minutes in Stage N1, 166 minutes Stage N2, 109.5 minutes Stage N3 and 65.5 minutes in Stage REM.  The percentage of Stage N1 was 12.2%, Stage N2 was 42.7%, Stage N3 was 28.2% and Stage R (REM sleep) was 16.9%.   RESPIRATORY ANALYSIS:  There were a total of 55 respiratory events:  3 obstructive apneas, 0 central apneas and 10 mixed apneas with a total of 13 apneas and an apnea index (AI) of 2. /hour. There were 42 hypopneas with a hypopnea index of 6.5 /hour.    The total APNEA/HYPOPNEA INDEX (AHI) was 8.5 /hour.  17 events occurred in REM sleep and 69 events in NREM. The REM AHI was 15.6 /hour, versus a non-REM AHI of 7.1. The patient spent 51.5 minutes of total sleep time in the supine position and 337 minutes in non-supine. The supine AHI was 43.1/h and associated with louder snoring, versus a non-supine AHI of 3.2/h.  OXYGEN SATURATION & C02:  The Wake baseline 02 saturation was 96%, with the  lowest being 81%. Time spent below 89% saturation equaled 81 minutes. The patient had a total of 28 Periodic Limb Movements.  The Periodic Limb Movement (PLM) index was 4.3 and the PLM Arousal index was 2.0/hour. The arousals were noted as: 36 were spontaneous, 13 were associated with PLMs, and 24 were associated with respiratory events. Audio and video analysis did not show any abnormal or unusual movements, behaviors, phonations or vocalizations.   The patient took one bathroom break. Snoring was noted. EKG was in keeping with normal sinus rhythm (NSR). Post-study, the patient indicated that sleep was the same as usual.   IMPRESSION:  1. Mild Obstructive Sleep Apnea (OSA), much  accentuated by supine sleep position and also by REM sleep.  2. Prolonged, severe sleep hypoxemia.  3. Mild- Moderate Periodic Limb Movement Disorder (PLMD) can be explained by known DDD and neuropathy.  4. Primary Snoring.   RECOMMENDATIONS:  1. Advise to obtain full-night, attended, CPAP titration study to optimize therapy and allow for Oxygen titration should the nadir not rise with completely treated and controlled apnea.  2. The main attention will be given to complete resolution of apnea while in REM and possibly supine.    I certify that I have reviewed the entire raw data recording prior to the issuance of this report in accordance with the Standards of Accreditation of the [place] [employer] (AASM)   Larey Seat, MD   07-01-2019 Diplomat, American Board of Psychiatry and Neurology  Diplomat, American Board of [place] Director, [place] [facility] [employer]

## 2019-07-01 NOTE — Telephone Encounter (Signed)
I have talked peer to peer review.  MRI lumbar: [id number] valid date until 08-22-2019 MRI cervical: was approved from Jan order, but expired on March 26th

## 2019-07-02 ENCOUNTER — Ambulatory Visit: Payer: Medicare Other | Admitting: Physical Therapy

## 2019-07-02 ENCOUNTER — Other Ambulatory Visit: Payer: Self-pay

## 2019-07-02 ENCOUNTER — Telehealth: Payer: Self-pay | Admitting: Neurology

## 2019-07-02 DIAGNOSIS — M6281 Muscle weakness (generalized): Secondary | ICD-10-CM

## 2019-07-02 DIAGNOSIS — R2681 Unsteadiness on feet: Secondary | ICD-10-CM

## 2019-07-02 DIAGNOSIS — R2689 Other abnormalities of gait and mobility: Secondary | ICD-10-CM

## 2019-07-02 NOTE — Telephone Encounter (Signed)
-----   Message from Larey Seat, MD sent at 07/01/2019  1:14 PM EDT ----- The total APNEA/HYPOPNEA INDEX (AHI) was 8.5 /hour.  17 events occurred in REM sleep and 69 events in NREM. The REM AHI was 15.6 /hour, versus a non-REM AHI of 7.1. The patient spent 51.5 minutes of total sleep time in the supine position and 337 minutes in non-supine. The supine AHI was 43.1/h and associated with louder snoring, versus a non-supine AHI of 3.2/h. The Wake baseline 02 saturation was 96%, with the lowest being 81%. Time spent below 89% saturation equaled 81 minutes.  IMPRESSION:  1. Mild Obstructive Sleep Apnea (OSA), much accentuated by supine sleep position and also by REM sleep.  2. Prolonged, severe sleep hypoxemia.  3. Mild- Moderate Periodic Limb Movement Disorder (PLMD) can be explained by known DDD and neuropathy.  4. Primary Snoring.   RECOMMENDATIONS:  1. Advise to obtain full-night, attended, CPAP titration study to optimize therapy and allow for Oxygen titration should the nadir not rise with completely treated and controlled apnea.  2. The main attention will be given to complete resolution of apnea while in REM and possibly supine.

## 2019-07-02 NOTE — Telephone Encounter (Signed)
pt is scheduled at [facility] for 07/08/19.

## 2019-07-02 NOTE — Telephone Encounter (Signed)
I called pt. I advised pt that Dr. Dohmeier reviewed their sleep study results and found that mild sleep apnea and recommends that pt be treated with a cpap. Dr. Dohmeier recommends that pt return for a repeat sleep study in order to properly titrate the cpap and ensure a good mask fit. Pt is agreeable to returning for a titration study. I advised pt that our sleep lab will file with pt's insurance and call pt to schedule the sleep study when we hear back from the pt's insurance regarding coverage of this sleep study. Pt verbalized understanding of results. Pt had no questions at this time but was encouraged to call back if questions arise.   
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
## 2019-07-04 ENCOUNTER — Ambulatory Visit: Payer: Medicare Other | Admitting: Physical Therapy

## 2019-07-04 ENCOUNTER — Telehealth: Payer: Self-pay | Admitting: Neurology

## 2019-07-04 NOTE — Telephone Encounter (Signed)
I was able to speak with the patient.  She wanted to know if she should change her follow up with Dr. Krista Blue until after his sleep study.  She understands Dr. Krista Blue is treating her gait abnormality/paresthesia and Dr. Brett Fairy is treating her OSA.  She will keep her pending appt on 07/29/2019.

## 2019-07-04 NOTE — Telephone Encounter (Signed)
Pt called wanting to speak to RN about her appt. She will not have her other appts done before her follow up here and would like to consult RN. Please advise.

## 2019-07-10 ENCOUNTER — Telehealth: Payer: Self-pay | Admitting: Neurology

## 2019-07-10 NOTE — Telephone Encounter (Signed)
Please call patient, MRI of cervical spine from [facility] on July 08, 2019, showed cervical spondylosis, most significant at C4-5 with disc osteophyte causing mild central canal stenosis and moderate bilateral neural foraminal narrowing MRI of lumbar without contrast showed multilevel degenerative changes, with mild foraminal stenosis on the left at L4-5

## 2019-07-11 NOTE — Telephone Encounter (Signed)
Called the patient to review the MRI findings. Informed her of the results and advised of the findings. Pt asking if Dr Philip Aspen can be made aware of the results. Advised I would see if I can send this information over to him. Pt verbalized understanding. Pt had no questions at this time but was encouraged to call back if questions arise. Will follow up 07/29/2019 at 11 am.

## 2019-07-18 ENCOUNTER — Other Ambulatory Visit: Payer: Self-pay

## 2019-07-18 ENCOUNTER — Ambulatory Visit: Payer: Medicare Other | Attending: Neurology | Admitting: Physical Therapy

## 2019-07-18 DIAGNOSIS — R2681 Unsteadiness on feet: Secondary | ICD-10-CM

## 2019-07-18 DIAGNOSIS — R2689 Other abnormalities of gait and mobility: Secondary | ICD-10-CM | POA: Diagnosis present

## 2019-07-18 DIAGNOSIS — M6281 Muscle weakness (generalized): Secondary | ICD-10-CM | POA: Diagnosis present

## 2019-07-18 NOTE — Patient Instructions (Signed)
Walking on Heels    Walk on heels for _15__ feet while continuing on a straight path. Do _1-2__ sessions per day.   Dorsiflexion: Resisted    Facing anchor, tubing around left foot, pull toward face.  Repeat __15__ times per set. Do _1__ sets per session. Do 1-2____ sessions per day.   HOLD red band down with opposite foot -lift up and hold for 3 secs  [email or web address]     STRETCH DORSIFLEXORS BY POINTING FOOT DOWN AND HOLDING FOR 30 SECS
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
## 2019-07-23 ENCOUNTER — Encounter: Payer: Self-pay | Admitting: Sports Medicine

## 2019-07-23 ENCOUNTER — Ambulatory Visit: Payer: Medicare Other | Admitting: Sports Medicine

## 2019-07-23 ENCOUNTER — Other Ambulatory Visit: Payer: Self-pay

## 2019-07-23 VITALS — Temp 98.0°F

## 2019-07-23 DIAGNOSIS — G3281 Cerebellar 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: Secondary | ICD-10-CM

## 2019-07-23 DIAGNOSIS — M79674 Pain in right toe(s): Secondary | ICD-10-CM | POA: Diagnosis not present

## 2019-07-23 DIAGNOSIS — M792 Neuralgia and neuritis, unspecified: Secondary | ICD-10-CM | POA: Diagnosis not present

## 2019-07-23 DIAGNOSIS — R200 Anesthesia of skin: Secondary | ICD-10-CM

## 2019-07-23 DIAGNOSIS — M779 Enthesopathy, unspecified: Secondary | ICD-10-CM

## 2019-07-23 DIAGNOSIS — R269 Unspecified abnormalities of gait and mobility: Secondary | ICD-10-CM

## 2019-07-23 NOTE — Progress Notes (Signed)
Subjective: Hailey Perkins is a 84 y.o. female patient who presents to office for follow-up evaluation of right foot pain. Patient reports that pain feels a little better however at this time is interested in other treatments for her toes especially on the right foot.  Patient reports that she has pain at the end of day that consist of burning patient is currently on Cymbalta and reports that in the past her PCP has tried to go up on higher doses but suffers with dizziness and other side effects.  Patient reports that she wants to talk about different options but is unsure now if she wants anything surgical.  Patient denies any other changes with medical history or any other pedal complaints at this time.  Patient Active Problem List   Diagnosis Date Noted  . Cerebellar 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 ([place]) 05/06/2019  . Excessive daytime sleepiness 05/06/2019  . Loud snoring 05/06/2019  . Ventral hernia without obstruction or gangrene 02/22/2019  . Chronic throat clearing 01/18/2019  . Nocturnal cough 01/18/2019  . Family history of colon cancer in father 01/18/2019  . Diverticulosis 01/18/2019  . Gait abnormality 12/27/2018  . Numbness 12/27/2018  . Status post total bilateral knee replacement 11/30/2018  . DDD (degenerative disc disease), cervical 11/30/2018  . Status post carpal tunnel release 11/30/2018  . DDD (degenerative disc disease), lumbar 10/26/2018  . Primary osteoarthritis of both hands 10/26/2018  . Primary osteoarthritis of both feet 10/26/2018  . Gastroesophageal reflux disease 08/27/2018  . Bilateral impacted cerumen 08/21/2018  . Sensorineural hearing loss (SNHL) of both ears 08/21/2018  . Tinnitus, bilateral 08/21/2018  . Blood in urine 04/17/2018  . Secondary open-angle glaucoma of left eye, moderate stage 03/10/2018  . Early stage 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 10/05/2017  . Epiretinal membrane (ERM) of left eye 10/05/2017  .  Pseudophakia of both eyes 10/05/2017  . Osteopenia 03/19/2017  . Mixed hyperlipidemia 08/18/2016  . Generalized osteoarthritis of multiple sites 08/18/2016  . Peripheral neuropathic pain 08/18/2016  . Overactive bladder 08/18/2016  . Glaucoma 08/18/2016  . Cervical disc disorder with radiculopathy of cervical region 08/17/2016  . Carpal tunnel syndrome, right upper limb 08/17/2016  . Abnormal finding on mammography 06/14/2016  . Breast lump 05/06/2015    Current Outpatient Medications on File Prior to Visit  Medication Sig Dispense Refill  . amoxicillin-clavulanate (AUGMENTIN) 875-125 MG tablet Take 1 tablet by mouth 2 (two) times daily.    . Ascorbic Acid (VITAMIN C) 1000 MG tablet Take 1,000 mg by mouth daily.    . betamethasone dipropionate 0.05 % lotion APP ON THE SKIN D  0  . Biotin 10 MG TABS Take by mouth.    . Calcium Citrate-Vitamin D (CALCIUM + D PO) Take by mouth daily.    . CELEBREX 200 MG capsule Take 1 capsule (200 mg total) by mouth daily as needed. 90 capsule 1  . Cholecalciferol (VITAMIN D3) 2000 units TABS Take 1 tablet by mouth daily.    . ciprofloxacin (CIPRO) 500 MG tablet Take 1 tablet (500 mg total) by mouth 2 (two) times daily. 20 tablet 0  . dicyclomine (BENTYL) 10 MG capsule TAKE 2 CAPSULES (20 MG TOTAL) BY MOUTH 4 (FOUR) TIMES DAILY - BEFORE MEALS AND AT BEDTIME. 90 capsule 3  . dorzolamide (TRUSOPT) 2 % ophthalmic solution 1 drop 2 (two) times daily.    . DULoxetine (CYMBALTA) 30 MG capsule TAKE 1 CAPSULE(30 MG) BY MOUTH DAILY 90 capsule 2  .  GINKGO BILOBA PO Take by mouth daily.    Marland Kitchen. GLUCOSAMINE-CHONDROITIN-MSM PO Take by mouth. Take 2 oz. Daily.  Glucosamine 2,000 mg Chondroitin 1,200 mg, MSM 500 mg, Hyaluronic Acid 10 mg.    . HYPROMELLOSE OP Place into both eyes at bedtime.    . Magnesium 250 MG TABS Take 1 tablet by mouth daily.    . metroNIDAZOLE (FLAGYL) 250 MG tablet TAKE 1 TABLET (250 MG TOTAL) BY MOUTH 3 (THREE) TIMES DAILY.    . metroNIDAZOLE  (METROGEL) 1 % gel Apply 1 application topically as needed (rosacea).    . Omega-3 Fatty Acids (FISH OIL) 1000 MG CAPS Take 1 capsule by mouth.    Marland Kitchen. omeprazole (PRILOSEC) 40 MG capsule Take 1 capsule (40 mg total) by mouth daily. 60 capsule 3  . Plant Sterols and Stanols (CHOLESTOFF PLUS PO) Take 1 capsule by mouth daily.    . Polyvinyl Alcohol-Povidone (FRESHKOTE) 2.7-2 % SOLN Place 1 drop into both eyes 4 times daily.    . psyllium (METAMUCIL) 58.6 % packet Take 1 packet by mouth daily.    . Sodium Fluoride (PREVIDENT 5000 BOOSTER PLUS) 1.1 % PSTE Place onto teeth daily.    . TURMERIC CURCUMIN PO Take by mouth daily.    Marland Kitchen. UNABLE TO FIND CBD oil for pain, as needed.  Hemp flower extract for inflammation, as needed.     No current facility-administered medications on file prior to visit.     Allergies  Allergen Reactions  . Protonix [Pantoprazole Sodium]     Pt stated, "Blood pin-point rash"  . Tape Other (See Comments)    Adhesive Tape; pt stated "itch and redness" Adhesive Tape; pt stated "itch and redness"  . Pantoprazole Rash    Objective:  General: Alert and oriented x3 in no acute distress  Dermatology: No open lesions bilateral lower extremities, no webspace macerations, no ecchymosis bilateral, all nails x 10 are well manicured.  Vascular: Dorsalis Pedis and Posterior Tibial pedal pulses palpable.  Neurology: Michaell CowingGross sensation intact via light touch bilateral, Protective sensation intact  with Phoebe PerchSemmes Weinstein Monofilament to all pedal sites, subjective burning pain to the right fourth and fifth toes and also to left foot at first toe.  Negative Mulder sign on right.  Musculoskeletal: Mild tenderness with palpation at right fourth and fifth toes.  Severe bunion and hammertoe deformity.  Assessment and Plan: Problem List Items Addressed This Visit      Nervous and Auditory   Cerebellar 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 [facility](HCC)     Other   Gait abnormality    Numbness    Other Visit Diagnoses    Neuritis    -  Primary   Capsulitis       Toe pain, right           -Complete examination performed -Discussed treatement options for likely neuritis versus capsulitis that is complicated by underlying condition of neuropathy/neuritis and biomechanical gait and digital deformity -Patient would like to try physical therapy to see if this TENS unit can help with her pain and symptoms I will prescribe physical therapy for education on use of TENS unit and advised patient to bring her current devices that she has to the therapy visit for further instruction on how to use these home devices to see if this will help with her foot pain -Advised patient to rest ice elevate and may use topical pain creams or rubs as needed -Continue with nerve medicine that she is already on Cymbalta -  Continue with good supportive shoes and metatarsal padding and may resume digital spacers and separators at the fourth and fifth toes; new set of spacers were dispensed at this visit -Discussed with patient if she fails to make improvement with cushions wider shoes physical therapy may be a candidate for surgery advised patient that she would need multiple digits corrected and the result of surgery cannot be guaranteed.  Patient was advised that surgery may worsen neuritis or nerve symptoms. -Patient to return to office after PT or sooner if condition worsens.  Asencion Islamitorya Kao Berkheimer, DPM

## 2019-07-24 ENCOUNTER — Telehealth: Payer: Self-pay | Admitting: *Deleted

## 2019-07-24 DIAGNOSIS — M792 Neuralgia and neuritis, unspecified: Secondary | ICD-10-CM

## 2019-07-24 DIAGNOSIS — R269 Unspecified abnormalities of gait and mobility: Secondary | ICD-10-CM

## 2019-07-24 DIAGNOSIS — R200 Anesthesia of skin: Secondary | ICD-10-CM

## 2019-07-24 DIAGNOSIS — G3281 Cerebellar 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: Secondary | ICD-10-CM

## 2019-07-24 NOTE — Telephone Encounter (Signed)
Hand delivered PT rx to [facility]. 

## 2019-07-24 NOTE — Telephone Encounter (Signed)
-----   Message from Landis Martins, [place] sent at 07/23/2019  9:46 AM EDT ----- Regarding: PT at [facility] TENS unit for nerve pain

## 2019-07-29 ENCOUNTER — Encounter: Payer: Self-pay | Admitting: Neurology

## 2019-07-29 ENCOUNTER — Other Ambulatory Visit: Payer: Self-pay

## 2019-07-29 ENCOUNTER — Inpatient Hospital Stay (HOSPITAL_COMMUNITY): Admission: RE | Admit: 2019-07-29 | Payer: Self-pay | Source: Ambulatory Visit

## 2019-07-29 ENCOUNTER — Ambulatory Visit (INDEPENDENT_AMBULATORY_CARE_PROVIDER_SITE_OTHER): Payer: Medicare Other | Admitting: Neurology

## 2019-07-29 VITALS — BP 128/82 | HR 64 | Temp 97.7°F | Ht 63.5 in | Wt 162.5 lb

## 2019-07-29 DIAGNOSIS — R200 Anesthesia of skin: Secondary | ICD-10-CM | POA: Diagnosis not present

## 2019-07-29 DIAGNOSIS — R269 Unspecified abnormalities of gait and mobility: Secondary | ICD-10-CM
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
## 2019-07-30 ENCOUNTER — Other Ambulatory Visit (HOSPITAL_COMMUNITY)
Admission: RE | Admit: 2019-07-30 | Discharge: 2019-07-30 | Disposition: A | Discharge status: Home / Self Care | Payer: Medicare Other | Source: Ambulatory Visit | Attending: Neurology | Admitting: Neurology

## 2019-07-30 DIAGNOSIS — Z01812 Encounter for preprocedural laboratory examination: Secondary | ICD-10-CM | POA: Diagnosis present

## 2019-07-30 DIAGNOSIS — Z20828 Contact with and (suspected) exposure to other viral communicable diseases: Secondary | ICD-10-CM | POA: Insufficient documentation

## 2019-07-30 LAB — SARS CORONAVIRUS 2 (TAT 6-24 HRS): SARS Coronavirus 2: NEGATIVE

## 2019-08-01 ENCOUNTER — Ambulatory Visit (INDEPENDENT_AMBULATORY_CARE_PROVIDER_SITE_OTHER): Payer: Medicare Other | Admitting: Neurology

## 2019-08-01 ENCOUNTER — Other Ambulatory Visit: Payer: Self-pay

## 2019-08-01 DIAGNOSIS — G4734 Idiopathic sleep related nonobstructive alveolar hypoventilation: Secondary | ICD-10-CM

## 2019-08-01 DIAGNOSIS — G4761 Periodic limb movement disorder: Secondary | ICD-10-CM

## 2019-08-01 DIAGNOSIS — G4733 Obstructive sleep apnea (adult) (pediatric): Secondary | ICD-10-CM

## 2019-08-05 ENCOUNTER — Encounter: Payer: Self-pay | Admitting: *Deleted

## 2019-08-05 ENCOUNTER — Telehealth: Payer: Self-pay | Admitting: Neurology

## 2019-08-05 DIAGNOSIS — G4761 Periodic limb movement disorder: Secondary | ICD-10-CM | POA: Insufficient documentation

## 2019-08-05 DIAGNOSIS — G4733 Obstructive sleep apnea (adult) (pediatric): Secondary | ICD-10-CM | POA: Insufficient documentation

## 2019-08-05 DIAGNOSIS — G4734 Idiopathic sleep related nonobstructive alveolar hypoventilation: Secondary | ICD-10-CM | POA: Insufficient documentation

## 2019-08-05 NOTE — Telephone Encounter (Signed)
I was not able to reach patient,   Get sleep study feedback:  1. Obstructive Sleep Apnea with REM accentuation and supine  accentuation was well controlled under CPAP at 10 cm water. I  will allow the autotitration device to have an extra 3 cm  pressure exceeding the optimum, and heated humidity and refitting  the patient for a smaller interface of her choice.   2. Sleep Related Hypoxemia was controlled.  3. Upper Airway Resistance Syndrome did still produce mild  snoring at 10 cm water pressure. The patient will need a FFM or a  nasal mask with chin strap.  4. Breakthrough Periodic Limb Movement Disorder, seen again,  decreased in the last 90 minutes of the study. PLMs responded  also to CPAP therapy, albeit not with complete resolution.   PLANS/RECOMMENDATIONS: 1) Start Auto CPAP with heated humidity,  a FFM of patient's choice and fitted to her needs, and pressures  of 5 through 13 cm water with 2 cm EPR.  Avoid supine sleep!   PAP therapy compliance is defined as 4 hours or more of nightly  use. All apnea patients should avoid evening sedatives,  hypnotics, and consumption of alcoholic beverages   DISCUSSION: PCP/ Dr. Krista Blue to consider increasing the Cymbalta  dose, or starting Mirapex/ Requip  Please call patient again, if she has any concern about her sleep, will increase cymbalta to 60 mg daily

## 2019-08-05 NOTE — Telephone Encounter (Addendum)
I attempted to reach the patient again for third time.  No answer and unable to leave a message.   She is active on mychart and I will also send her a message this way.   When she contacts [place] back, we are happy to review the results below and discuss her medications.

## 2019-08-05 NOTE — Procedures (Signed)
PATIENT'S NAME:  Hailey Perkins, Hailey Perkins DOB:      1935-09-07      MR#:    [id number]     DATE OF RECORDING: 08/01/2019 REFERRING M.D.:  Jarome Matinaniel Paterson MD Study Performed:   Titration to Positive Airway Pressure Hailey Perkins is a 84 y.o. female patient who returns for PAP titration and possible oxygen addition today.   The patient has already been seen by [facility] Terrace [facility]Yan at [facility] in January 2020, and addressed memory problems, gait problems in that visit. Her diagnostic Polysomnogram performed on 25 June 2019 revealed: Mild Obstructive Sleep Apnea (OSA), much accentuated by supine sleep position and also by REM sleep .Prolonged, severe sleep hypoxemia. Primary Snoring. Mild- Moderate Periodic Limb Movement Disorder (PLMD) can be explained by known DDD and neuropathy.   The patient endorsed the Epworth Sleepiness Scale at 17/24 points, FSS 43/63.   The patient's weight 165 pounds with a height of 63 (inches), resulting in a BMI of 29.3 kg/m2. The patient's neck circumference measured 17 inches.   CURRENT MEDICATIONS: Trusopt, Cymbalta, Metrogel, Prilosec  PROCEDURE:  This is a multichannel digital polysomnogram utilizing the SomnoStar 11.2 system.  Electrodes and sensors were applied and monitored per AASM Specifications.   EEG, EOG, Chin and Limb EMG, were sampled at 200 Hz.  ECG, Snore and Nasal Pressure, Thermal Airflow, Respiratory Effort, CPAP Flow and Pressure, Oximetry was sampled at 50 Hz. Digital video and audio were recorded.      CPAP was initiated at 5 cmH20 with heated humidity per AASM split night standards and pressure was advanced to 10 cmH20 because of hypopneas, apneas and desaturations.  A SIMPLUS FFM was used in small size. At a PAP pressure of 10 cmH20, the patient slept 77 minutes with SpO2 nadir of 88%, mild snoring persisted. There was a reduction of the AHI to 0.0/h with improvement of sleep apnea.  Lights Out was at 21:52 and Lights On at 05:20. Total recording time (TRT) was 449 minutes, with  a total sleep time (TST) of 413 minutes. The patient's sleep latency was 16 minutes. REM latency was 155.5 minutes.  The sleep efficiency was 92.0%.    SLEEP ARCHITECTURE: WASO (Wake after sleep onset) was 20 minutes.  There were 11.5 minutes in Stage N1, 301 minutes Stage N2, 43.5 minutes Stage N3 and 57 minutes in Stage REM. The percentage of Stage N1 was 2.8%, Stage N2 was 72.9%, Stage N3 was 10.5% and Stage R (REM sleep) was 13.8%.    RESPIRATORY ANALYSIS:  There was a total of 20 respiratory events: 14 obstructive apneas, 1 central apnea and 5 hypopneas.     The total APNEA/HYPOPNEA INDEX (AHI) was 2.9 /hour. Only one events occurred in REM sleep and 19 events in NREM. The REM AHI was 1.1 /hour versus a non-REM AHI of 3.2 /hour.  The patient spent 47.5 minutes of total sleep time in the supine position and 366 minutes in non-supine.  The supine AHI was 24.0, versus a non-supine AHI of 0.2/h.  OXYGEN SATURATION & C02:  The baseline 02 saturation was 93%, with the lowest being 85%. Time spent below 89% saturation equaled 10 minutes. AROUSALS: The arousals were noted as: 32 were spontaneous, 14 were associated with PLMs, and 16 were associated with respiratory events. The patient had a total of 114 Periodic Limb Movements. The Periodic Limb Movement (PLM) index was 16.6 and the PLM Arousal index was 2.0 /hour. PLMs decreased in the last 1.5 hours of sleep testing, likely  influenced by CPAP. LMs were very rarely seen in REM sleep. Audio and video analysis did not show any abnormal or unusual movements, behaviors, phonations or vocalizations.  The patient took one bathroom break. Mild Snoring was still noted at last explored pressure. EKG was in keeping with normal sinus rhythm (NSR).  Post-study, the patient indicated that sleep was more restless and the FFM too large or too heavy for comfort.  The patient had been fitted with a Small sized [employer] Simplus FF  Mask.  DIAGNOSIS 1. Obstructive Sleep Apnea with REM accentuation and supine accentuation was well controlled under CPAP at 10 cm water. I will allow the autotitration device to have an extra 3 cm pressure exceeding the optimum, and heated humidity and refitting the patient for a smaller interface of her choice.    2. Sleep Related Hypoxemia was controlled.  3. Upper Airway Resistance Syndrome did still produce mild snoring at 10 cm water pressure. The patient will need a FFM or a nasal mask with chin strap.  4. Breakthrough Periodic Limb Movement Disorder, seen again, decreased in the last 90 minutes of the study. PLMs responded also to CPAP therapy, albeit not with complete resolution.  PLANS/RECOMMENDATIONS:  1) Start Auto CPAP with heated humidity, a FFM of patient's choice and fitted to her needs, and pressures of 5 through 13 cm water with 2 cm EPR.  Avoid supine sleep!   PAP therapy compliance is defined as 4 hours or more of nightly use. All apnea patients should avoid evening sedatives, hypnotics, and consumption of alcoholic beverages  DISCUSSION: PCP/ Dr. Krista Blue to consider increasing the Cymbalta dose, or starting Mirapex/ Requip.    A follow up appointment will be scheduled in the Sleep Clinic at [facility].   Please call [phone] with any questions.     I certify that I have reviewed the entire raw data recording prior to the issuance of this report in accordance with the Standards of Accreditation of the American Academy of Sleep Medicine (AASM)  Larey Seat, M.D. Diplomat, Tax adviser of Psychiatry and Neurology  Diplomat, Tax adviser of Sleep Medicine Market researcher, [employer] [facility] at [employer]

## 2019-08-05 NOTE — Addendum Note (Signed)
Addended by: Larey Seat on: 08/05/2019 12:30 PM   Modules accepted: Orders

## 2019-08-05 NOTE — Telephone Encounter (Signed)
Dr. Krista Blue tried to call the patient.  I attempted to reach her a second time. No answer and voicemail box is full.

## 2019-08-06 ENCOUNTER — Telehealth: Payer: Self-pay | Admitting: *Deleted

## 2019-08-06 NOTE — Telephone Encounter (Signed)
-----   Message from Larey Seat, MD sent at 08/05/2019 12:30 PM EDT ----- 1. Obstructive Sleep Apnea with REM accentuation and supine  accentuation was well controlled under CPAP at 10 cm water. I  will allow the autotitration device to have an extra 3 cm  pressure exceeding the optimum, and heated humidity and refitting  the patient for a smaller interface of her choice.   2. Sleep Related Hypoxemia was controlled.  3. Upper Airway Resistance Syndrome did still produce mild  snoring at 10 cm water pressure. The patient will need a FFM or a  nasal mask with chin strap.  4. Breakthrough Periodic Limb Movement Disorder, seen again,  decreased in the last 90 minutes of the study. PLMs responded  also to CPAP therapy, albeit not with complete resolution.   PLANS/RECOMMENDATIONS: 1) Start Auto CPAP with heated humidity,  a FFM of patient's choice and fitted to her needs, and pressures  of 5 through 13 cm water with 2 cm EPR.  Avoid supine sleep!   PAP therapy compliance is defined as 4 hours or more of nightly  use. All apnea patients should avoid evening sedatives,  hypnotics, and consumption of alcoholic beverages   DISCUSSION: PCP/ Dr. Krista Blue to consider increasing the Cymbalta  dose, or starting Mirapex/ Requip.

## 2019-08-06 NOTE — Telephone Encounter (Signed)
Results reviewed with patient.  She verbalized understanding.  Patient is agreeable to CPAP therapy.  She did not wish to make any medication changes at this time. Orders sent to [facility].

## 2019-08-06 NOTE — Telephone Encounter (Signed)
I spoke to the patient and reviewed her sleep study results.  She is agreeable to start CPAP therapy and this will be set up through [facility] [phone]).  She does not wish to make any medication changes at this time.

## 2019-08-13 ENCOUNTER — Ambulatory Visit (INDEPENDENT_AMBULATORY_CARE_PROVIDER_SITE_OTHER): Payer: Medicare Other | Admitting: Podiatry

## 2019-08-13 ENCOUNTER — Other Ambulatory Visit: Payer: Self-pay

## 2019-08-13 ENCOUNTER — Encounter: Payer: Self-pay | Admitting: Podiatry

## 2019-08-13 DIAGNOSIS — B351 Tinea unguium: Secondary | ICD-10-CM

## 2019-08-13 DIAGNOSIS — R200 Anesthesia of skin: Secondary | ICD-10-CM | POA: Diagnosis not present

## 2019-08-13 NOTE — Progress Notes (Signed)
Complaint:  Visit Type: Patient returns to my office for continued preventative foot care services. Complaint: Patient states" my big toe  nails have grown long and ingrown  And has  become painful to walk and wear shoes"  The patient presents for preventative foot care services. No changes to ROS  Podiatric Exam: Vascular: dorsalis pedis and posterior tibial pulses are palpable bilateral. Capillary return is immediate. Temperature gradient is WNL. Skin turgor WNL  Sensorium: Normal Semmes Weinstein monofilament test. Normal tactile sensation bilaterally. Nail Exam: Pt has thick disfigured discolored nails with subungual debris noted bilateral hallux. Ulcer Exam: There is no evidence of ulcer or pre-ulcerative changes or infection. Orthopedic Exam: Muscle tone and strength are WNL. No limitations in general ROM. No crepitus or effusions noted. Foot type and digits show no abnormalities. Bony prominences are unremarkable. Skin: No Porokeratosis. No infection or ulcers  Diagnosis:  Onychomycosis, , Pain in right toe, pain in left toes  Treatment & Plan Procedures and Treatment: Consent by patient was obtained for treatment procedures.   Debridement of mycotic and hypertrophic toenails, 1 through 5 bilateral and clearing of subungual debris. No ulceration, no infection noted.  Return Visit-Office Procedure: Patient instructed to return to the office for a follow up visit 10 weeks  for continued evaluation and treatment.    Gardiner Barefoot DPM

## 2019-08-29 ENCOUNTER — Encounter: Payer: Self-pay | Admitting: Neurology

## 2019-09-23 ENCOUNTER — Other Ambulatory Visit: Payer: Self-pay | Admitting: Obstetrics & Gynecology

## 2019-09-23 DIAGNOSIS — N644 Mastodynia: Secondary | ICD-10-CM

## 2019-09-26 ENCOUNTER — Other Ambulatory Visit: Payer: Self-pay

## 2019-09-26 ENCOUNTER — Encounter: Payer: Self-pay | Admitting: Family Medicine

## 2019-09-26 ENCOUNTER — Ambulatory Visit: Payer: Medicare Other | Admitting: Family Medicine

## 2019-09-26 VITALS — BP 93/62 | HR 80 | Temp 97.5°F | Ht 63.5 in | Wt 160.8 lb

## 2019-09-26 DIAGNOSIS — Z9989 Dependence on other enabling machines and devices: Secondary | ICD-10-CM

## 2019-09-26 DIAGNOSIS — G4733 Obstructive sleep apnea (adult) (pediatric): Secondary | ICD-10-CM | POA: Diagnosis not present
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
## 2019-09-26 NOTE — Patient Instructions (Signed)
Continue CPAP nightly and for greater than 4 hours each night  Follow up in 6 months, sooner if needed   Sleep Apnea Sleep apnea affects breathing during sleep. It causes breathing to stop for a short time or to become shallow. It can also increase the risk of:  Heart attack.  Stroke.  Being very overweight (obese).  Diabetes.  Heart failure.  Irregular heartbeat. The goal of treatment is to help you breathe normally again. What are the causes? There are three kinds of sleep apnea:  Obstructive sleep apnea. This is caused by a blocked or collapsed airway.  Central sleep apnea. This happens when the brain does not send the right signals to the muscles that control breathing.  Mixed sleep apnea. This is a combination of obstructive and central sleep apnea. The most common cause of this condition is a collapsed or blocked airway. This can happen if:  Your throat muscles are too relaxed.  Your tongue and tonsils are too large.  You are overweight.  Your airway is too small. What increases the risk?  Being overweight.  Smoking.  Having a small airway.  Being older.  Being female.  Drinking alcohol.  Taking medicines to calm yourself (sedatives or tranquilizers).  Having family members with the condition. What are the signs or symptoms?  Trouble staying asleep.  Being sleepy or tired during the day.  Getting angry a lot.  Loud snoring.  Headaches in the morning.  Not being able to focus your mind (concentrate).  Forgetting things.  Less interest in sex.  Mood swings.  Personality changes.  Feelings of sadness (depression).  Waking up a lot during the night to pee (urinate).  Dry mouth.  Sore throat. How is this diagnosed?  Your medical history.  A physical exam.  A test that is done when you are sleeping (sleep study). The test is most often done in a sleep lab but may also be done at home. How is this treated?   Sleeping on your  side.  Using a medicine to get rid of mucus in your nose (decongestant).  Avoiding the use of alcohol, medicines to help you relax, or certain pain medicines (narcotics).  Losing weight, if needed.  Changing your diet.  Not smoking.  Using a machine to open your airway while you sleep, such as: ? An oral appliance. This is a mouthpiece that shifts your lower jaw forward. ? A CPAP device. This device blows air through a mask when you breathe out (exhale). ? An EPAP device. This has valves that you put in each nostril. ? A BPAP device. This device blows air through a mask when you breathe in (inhale) and breathe out.  Having surgery if other treatments do not work. It is important to get treatment for sleep apnea. Without treatment, it can lead to:  High blood pressure.  Coronary artery disease.  In men, not being able to have an erection (impotence).  Reduced thinking ability. Follow these instructions at home: Lifestyle  Make changes that your doctor recommends.  Eat a healthy diet.  Lose weight if needed.  Avoid alcohol, medicines to help you relax, and some pain medicines.  Do not use any products that contain nicotine or tobacco, such as cigarettes, e-cigarettes, and chewing tobacco. If you need help quitting, ask your doctor. General instructions  Take over-the-counter and prescription medicines only as told by your doctor.  If you were given a machine to use while you sleep, use it only  as told by your doctor.  If you are having surgery, make sure to tell your doctor you have sleep apnea. You may need to bring your device with you.  Keep all follow-up visits as told by your doctor. This is important. Contact a doctor if:  The machine that you were given to use during sleep bothers you or does not seem to be working.  You do not get better.  You get worse. Get help right away if:  Your chest hurts.  You have trouble breathing in enough air.  You have  an uncomfortable feeling in your back, arms, or stomach.  You have trouble talking.  One side of your body feels weak.  A part of your face is hanging down. These symptoms may be an emergency. Do not wait to see if the symptoms will go away. Get medical help right away. Call your local emergency services (911 in the [place] Do not drive yourself to the hospital. Summary  This condition affects breathing during sleep.  The most common cause is a collapsed or blocked airway.  The goal of treatment is to help you breathe normally while you sleep. This information is not intended to replace advice given to you by your health care provider. Make sure you discuss any questions you have with your health care provider. Document Released: 08/30/2008 Document Revised: 09/07/2018 Document Reviewed: 07/17/2018 Elsevier Patient Education  2020 Elsevier Inc.    CPAP and BPAP Information CPAP and BPAP are methods of helping a person breathe with the use of air pressure. CPAP stands for "continuous positive airway pressure." BPAP stands for "bi-level positive airway pressure." In both methods, air is blown through your nose or mouth and into your air passages to help you breathe well. CPAP and BPAP use different amounts of pressure to blow air. With CPAP, the amount of pressure stays the same while you breathe in and out. With BPAP, the amount of pressure is increased when you breathe in (inhale) so that you can take larger breaths. Your health care provider will recommend whether CPAP or BPAP would be more helpful for you. Why are CPAP and BPAP treatments used? CPAP or BPAP can be helpful if you have:  Sleep apnea.  Chronic obstructive pulmonary disease (COPD).  Heart failure.  Medical conditions that weaken the muscles of the chest including muscular dystrophy, or neurological diseases such as amyotrophic lateral sclerosis (ALS).  Other problems that cause breathing to be weak, abnormal, or  difficult. CPAP is most commonly used for obstructive sleep apnea (OSA) to keep the airways from collapsing when the muscles relax during sleep. How is CPAP or BPAP administered? Both CPAP and BPAP are provided by a small machine with a flexible plastic tube that attaches to a plastic mask. You wear the mask. Air is blown through the mask into your nose or mouth. The amount of pressure that is used to blow the air can be adjusted on the machine. Your health care provider will determine the pressure setting that should be used based on your individual needs. When should CPAP or BPAP be used? In most cases, the mask only needs to be worn during sleep. Generally, the mask needs to be worn throughout the night and during any daytime naps. People with certain medical conditions may also need to wear the mask at other times when they are awake. Follow instructions from your health care provider about when to use the machine. What are some tips for using the  mask?   Because the mask needs to be snug, some people feel trapped or closed-in (claustrophobic) when first using the mask. If you feel this way, you may need to get used to the mask. One way to do this is by holding the mask loosely over your nose or mouth and then gradually applying the mask more snugly. You can also gradually increase the amount of time that you use the mask.  Masks are available in various types and sizes. Some fit over your mouth and nose while others fit over just your nose. If your mask does not fit well, talk with your health care provider about getting a different one.  If you are using a mask that fits over your nose and you tend to breathe through your mouth, a chin strap may be applied to help keep your mouth closed.  The CPAP and BPAP machines have alarms that may sound if the mask comes off or develops a leak.  If you have trouble with the mask, it is very important that you talk with your health care provider about  finding a way to make the mask easier to tolerate. Do not stop using the mask. Stopping the use of the mask could have a negative impact on your health. What are some tips for using the machine?  Place your CPAP or BPAP machine on a secure table or stand near an electrical outlet.  Know where the on/off switch is located on the machine.  Follow instructions from your health care provider about how to set the pressure on your machine and when you should use it.  Do not eat or drink while the CPAP or BPAP machine is on. Food or fluids could get pushed into your lungs by the pressure of the CPAP or BPAP.  Do not smoke. Tobacco smoke residue can damage the machine.  For home use, CPAP and BPAP machines can be rented or purchased through home health care companies. Many different brands of machines are available. Renting a machine before purchasing may help you find out which particular machine works well for you.  Keep the CPAP or BPAP machine and attachments clean. Ask your health care provider for specific instructions. Get help right away if:  You have redness or open areas around your nose or mouth where the mask fits.  You have trouble using the CPAP or BPAP machine.  You cannot tolerate wearing the CPAP or BPAP mask.  You have pain, discomfort, and bloating in your abdomen. Summary  CPAP and BPAP are methods of helping a person breathe with the use of air pressure.  Both CPAP and BPAP are provided by a small machine with a flexible plastic tube that attaches to a plastic mask.  If you have trouble with the mask, it is very important that you talk with your health care provider about finding a way to make the mask easier to tolerate. This information is not intended to replace advice given to you by your health care provider. Make sure you discuss any questions you have with your health care provider. Document Released: 08/19/2004 Document Revised: 03/13/2019 Document Reviewed:  10/10/2016 Elsevier Patient Education  2020 [employer].

## 2019-10-01 ENCOUNTER — Other Ambulatory Visit: Payer: Self-pay

## 2019-10-01 ENCOUNTER — Ambulatory Visit
Admission: RE | Admit: 2019-10-01 | Discharge: 2019-10-01 | Disposition: A | Discharge status: Home / Self Care | Payer: Medicare Other | Source: Ambulatory Visit | Attending: Obstetrics & Gynecology | Admitting: Obstetrics & Gynecology

## 2019-10-01 DIAGNOSIS — N644 Mastodynia: Secondary | ICD-10-CM

## 2019-10-01 IMAGING — MG DIGITAL DIAGNOSTIC BILAT W/ TOMO
6 of 10 series · 6 of 30 positions shown · non-contrast
Comparison: Previous exam(s).

CLINICAL DATA: Left breast pain.

EXAM:
DIGITAL DIAGNOSTIC BILATERAL MAMMOGRAM WITH CAD AND TOMO
ULTRASOUND LEFT BREAST

[R CC synth-2D]
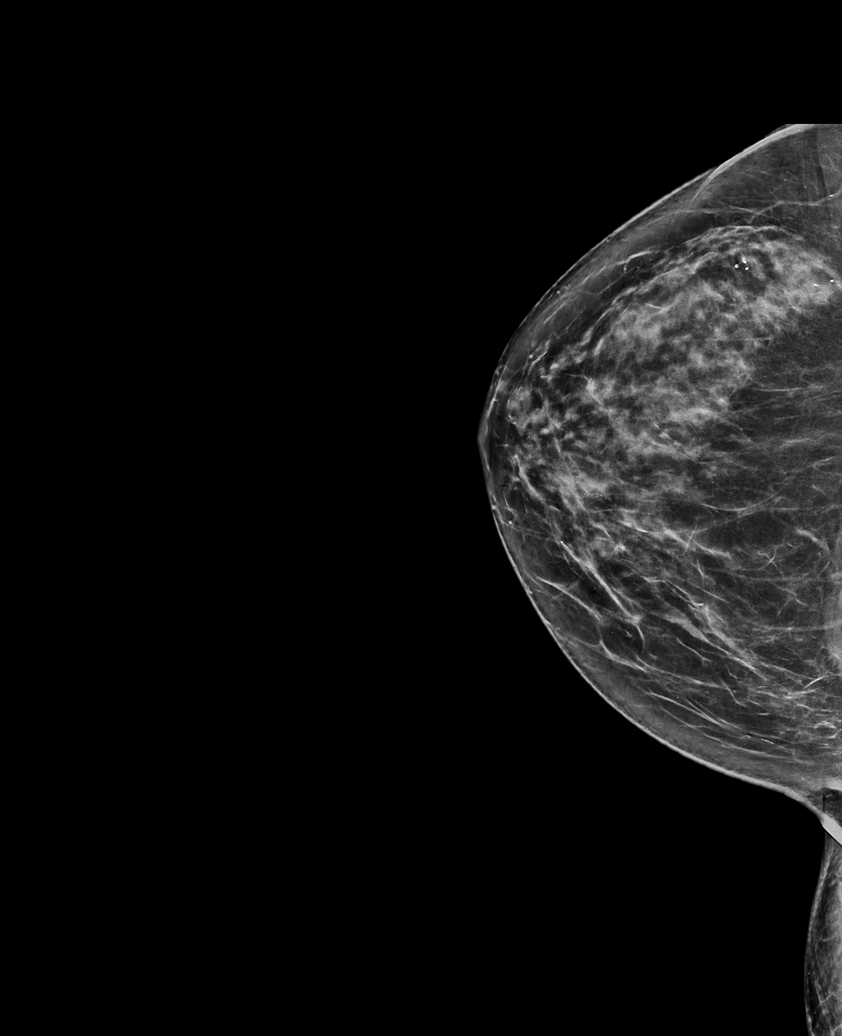

[L TAN synth-2D]
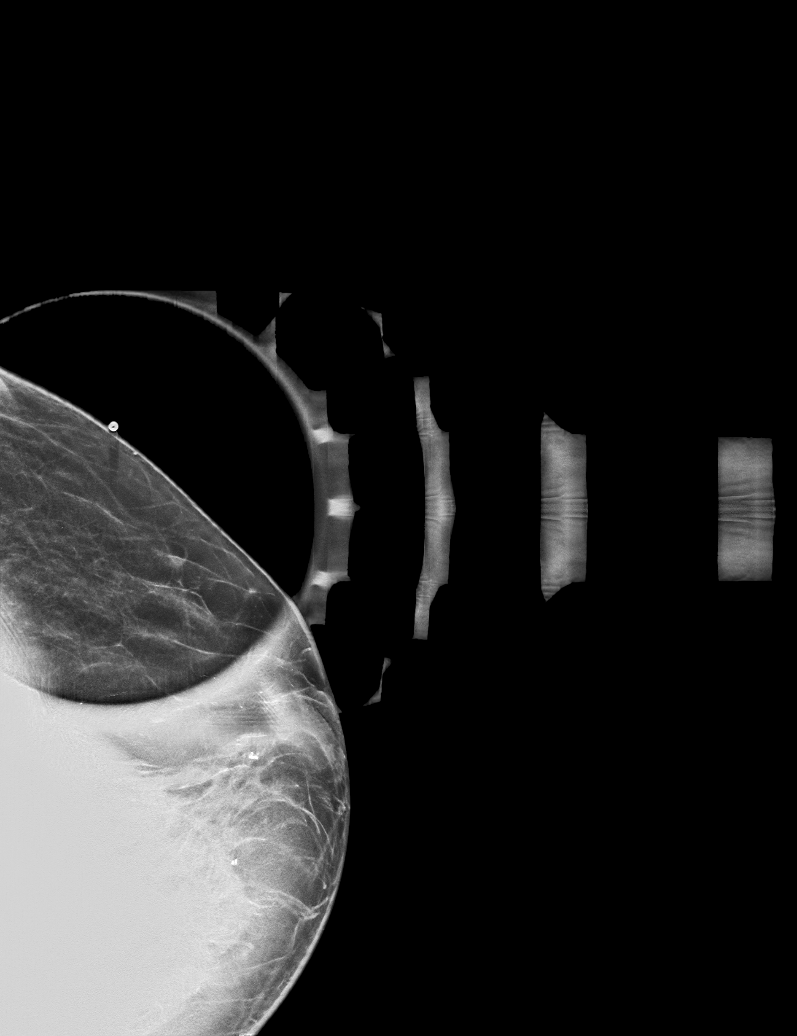

[L MLO synth-2D]
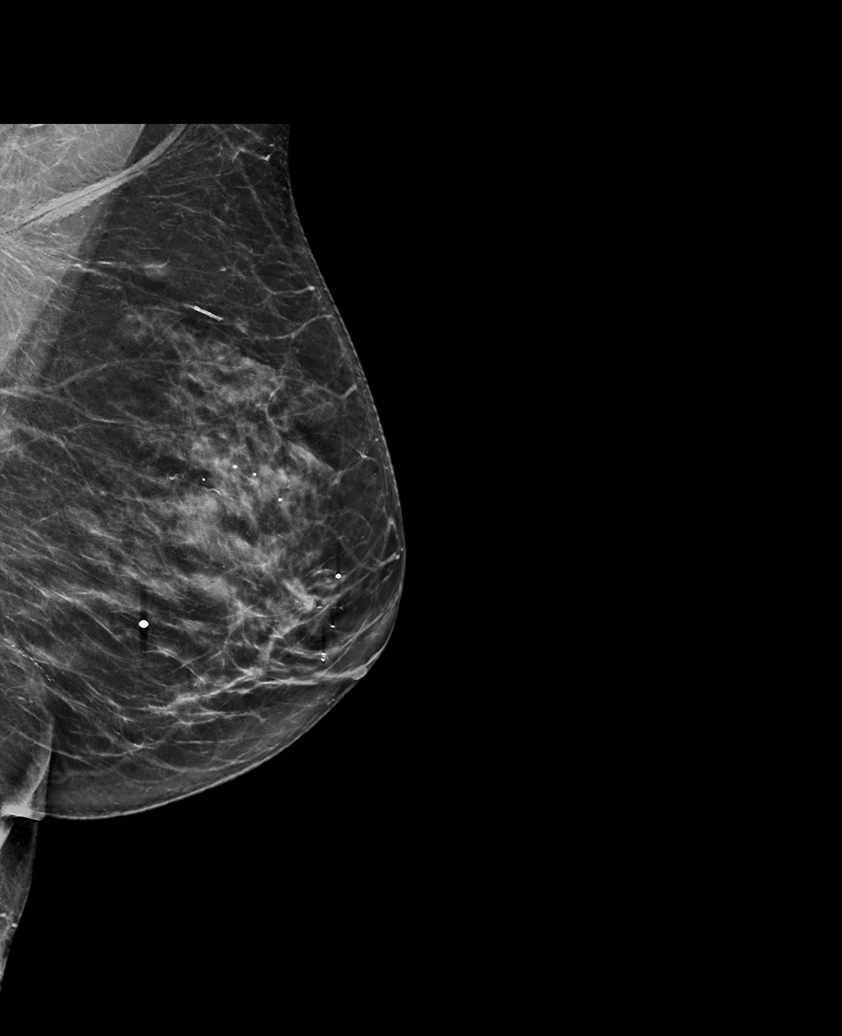

[R MLO synth-2D]
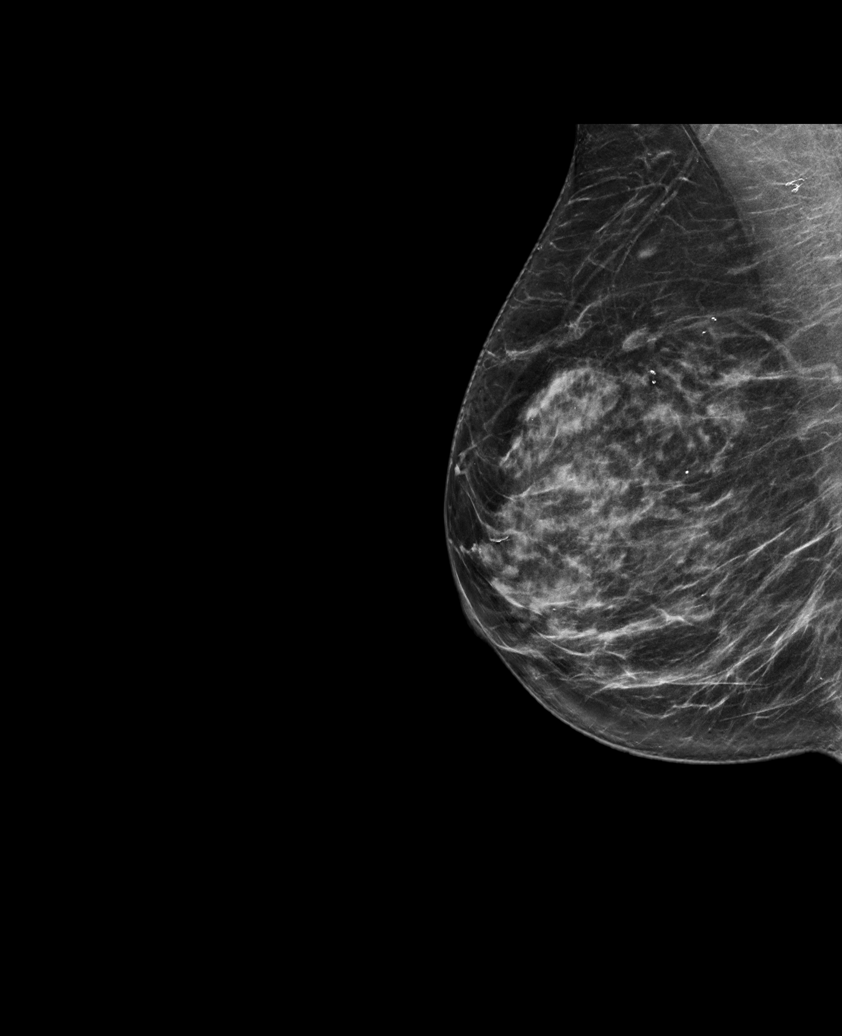

[L CC synth-2D]
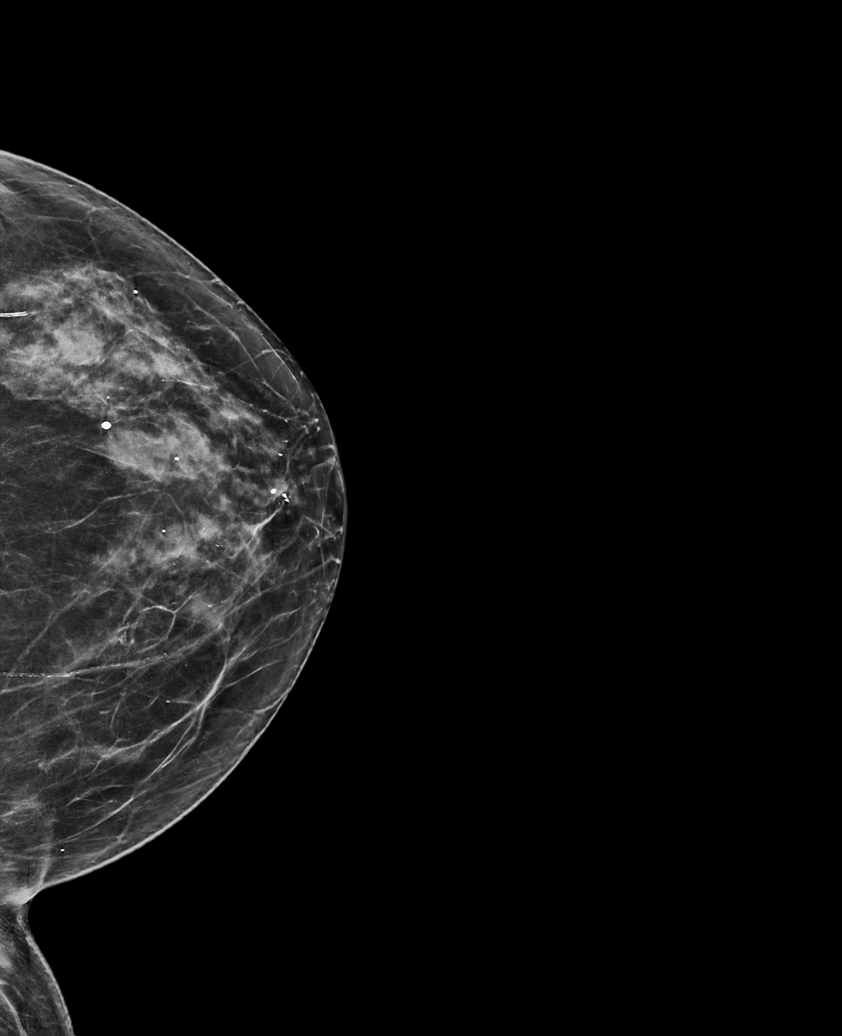

[L MLO tomo · tomo slice 37/72.0]
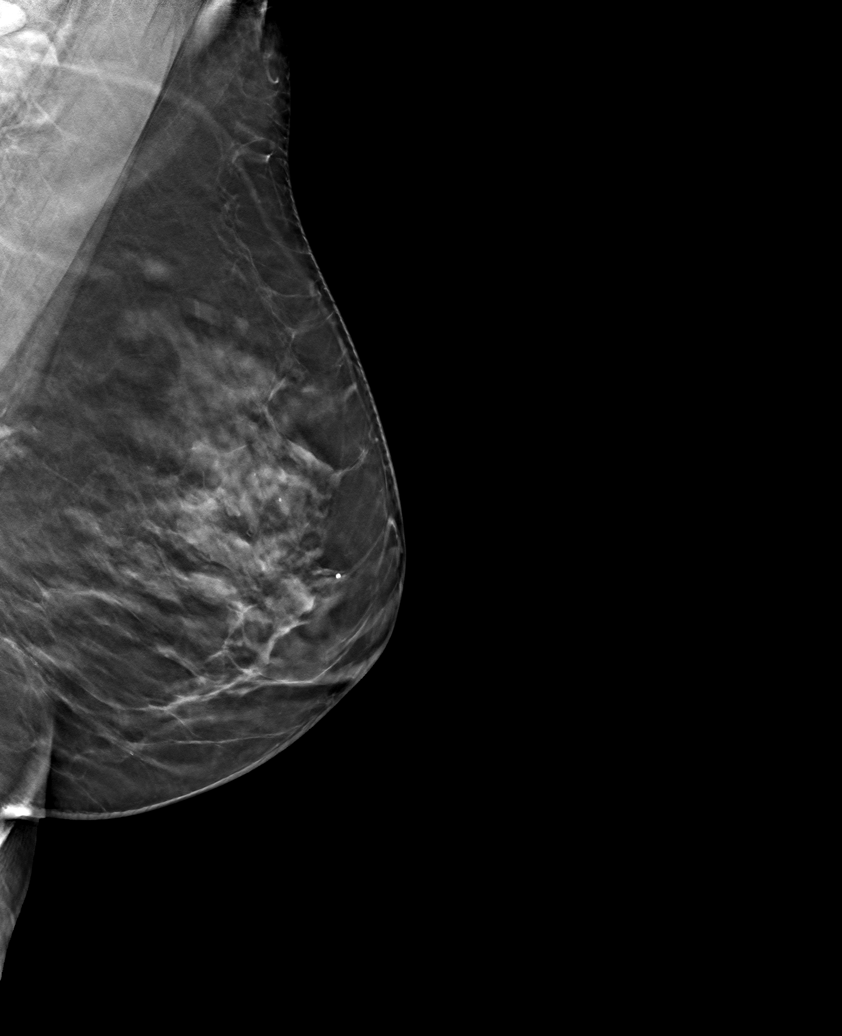

[6 of 30 positions shown; findings below may reference images not displayed]

ACR Breast Density Category c: The breast tissue is heterogeneously
dense, which may obscure small masses.
FINDINGS: No suspicious masses, calcifications, or distortion in either
breast.

Mammographic images were processed with CAD.

On physical exam, no suspicious lumps are identified.

Targeted ultrasound is performed, showing no sonographic
abnormalities in the region of the patient's focal pain.
IMPRESSION: No mammographic or sonographic evidence of malignancy.

RECOMMENDATION:
Annual screening mammography. Treatment of the patient's symptoms
should be based on clinical and physical exam given lack of imaging
findings.

I have discussed the findings and recommendations with the patient.
If applicable, a reminder letter will be sent to the patient
regarding the next appointment.

BI-RADS CATEGORY  2: Benign.

## 2019-10-01 IMAGING — US US BREAST*L* LIMITED INC AXILLA
1 series · 3 of 3 positions shown · non-contrast
Comparison: Previous exam(s).

CLINICAL DATA: Left breast pain.

EXAM:
DIGITAL DIAGNOSTIC BILATERAL MAMMOGRAM WITH CAD AND TOMO
ULTRASOUND LEFT BREAST

[Series 1: us breast*left* limited inc axilla · 0.06mm/px · 3 of 3 slices shown]
[im 1/3]
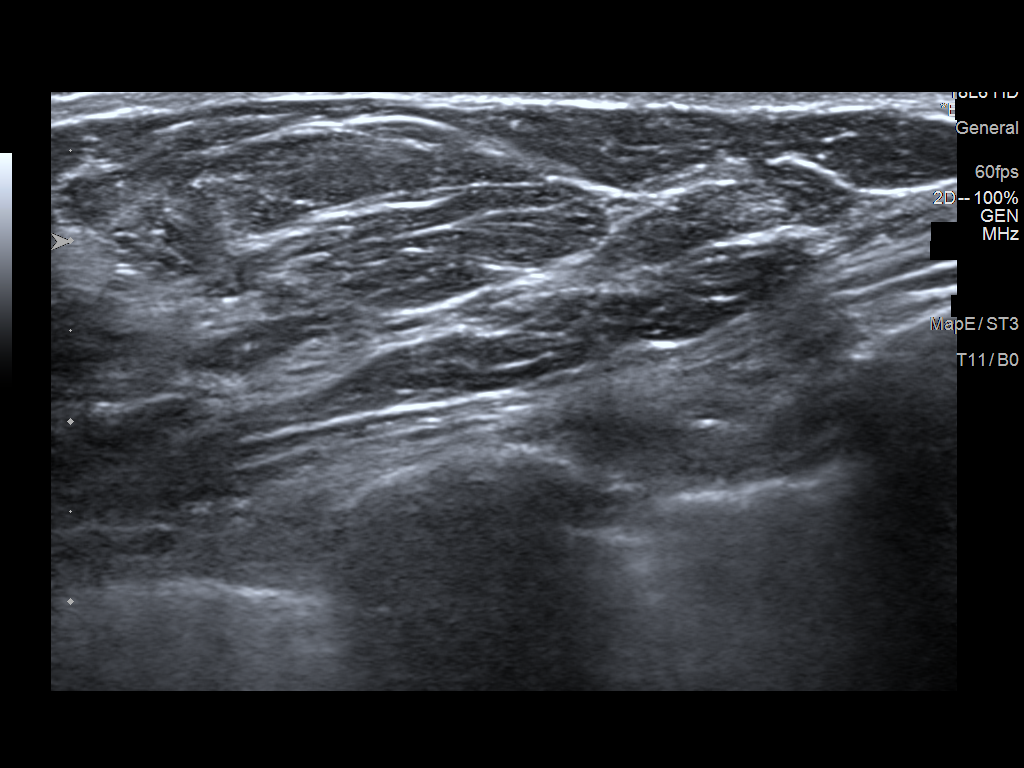
[im 2/3]
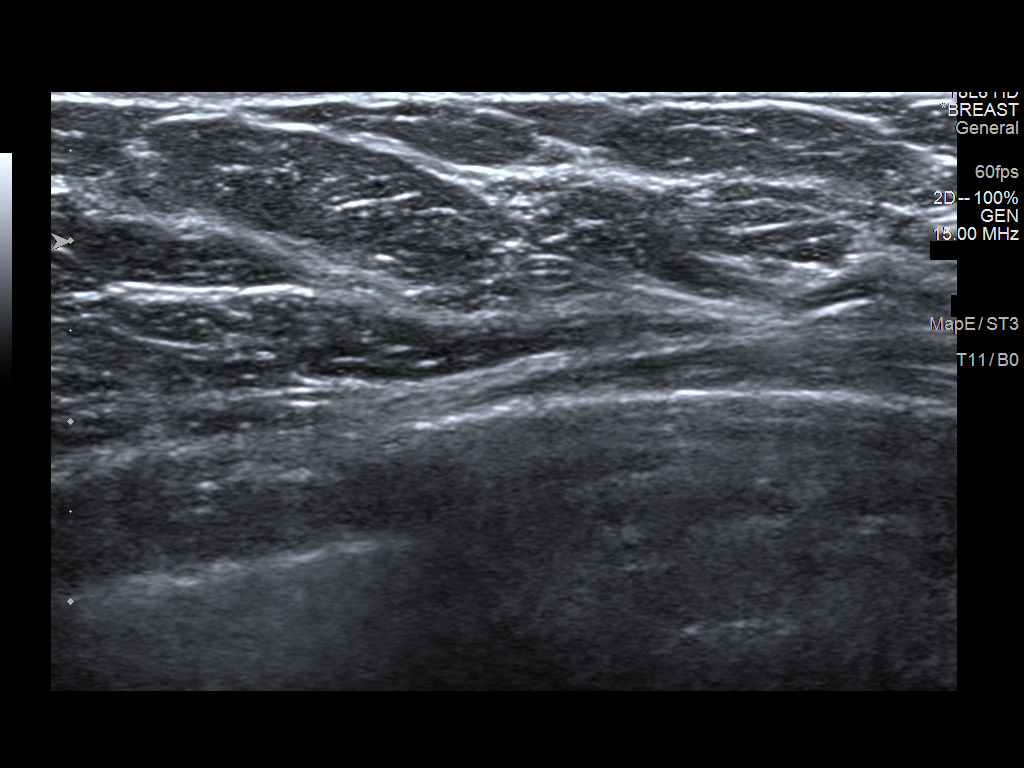
[im 3/3]
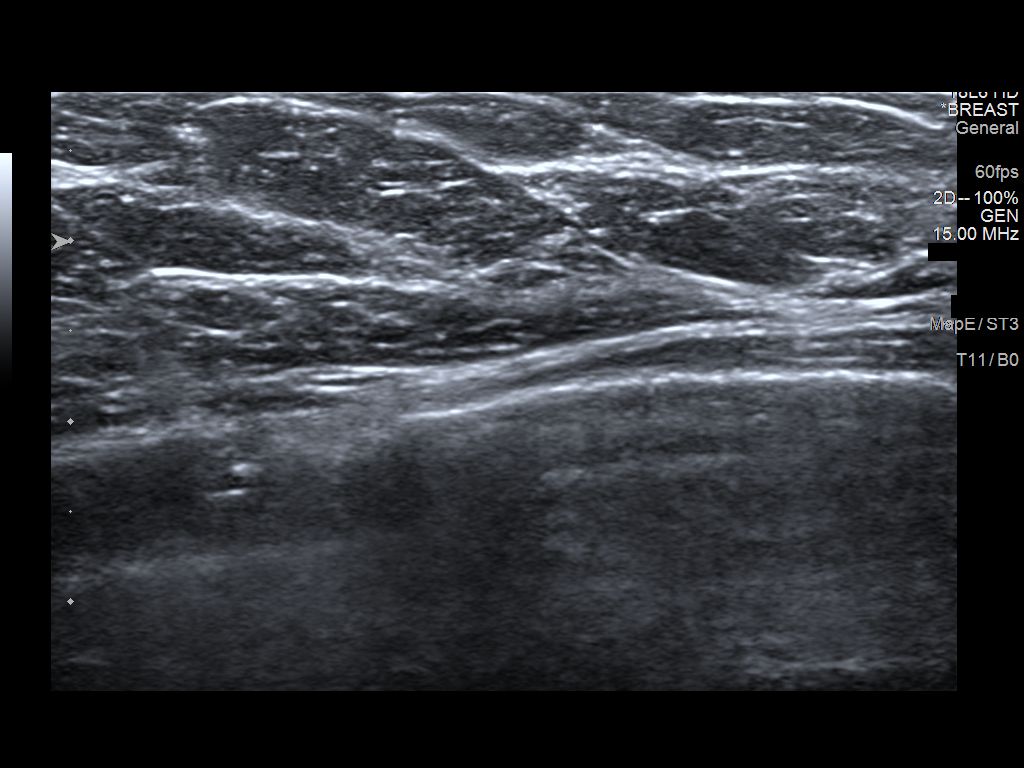

[3 of 3 positions shown; findings below may reference images not displayed]

ACR Breast Density Category c: The breast tissue is heterogeneously
dense, which may obscure small masses.
FINDINGS: No suspicious masses, calcifications, or distortion in either
breast.

Mammographic images were processed with CAD.

On physical exam, no suspicious lumps are identified.

Targeted ultrasound is performed, showing no sonographic
abnormalities in the region of the patient's focal pain.
IMPRESSION: No mammographic or sonographic evidence of malignancy.

RECOMMENDATION:
Annual screening mammography. Treatment of the patient's symptoms
should be based on clinical and physical exam given lack of imaging
findings.

I have discussed the findings and recommendations with the patient.
If applicable, a reminder letter will be sent to the patient
regarding the next appointment.

BI-RADS CATEGORY  2: Benign.

## 2019-10-10 DIAGNOSIS — H04123 Dry eye syndrome of bilateral lacrimal glands: Secondary | ICD-10-CM | POA: Insufficient documentation

## 2019-10-22 ENCOUNTER — Ambulatory Visit: Payer: Medicare Other | Admitting: Podiatry

## 2019-12-14 ENCOUNTER — Ambulatory Visit: Payer: Medicare Other | Attending: Internal Medicine

## 2019-12-14 DIAGNOSIS — Z23 Encounter for immunization: Secondary | ICD-10-CM

## 2019-12-14 NOTE — Progress Notes (Signed)
   Covid-19 Vaccination Clinic  Name:  Yeira Gulden    MRN: [id number] DOB: 1935-09-10  12/14/2019  Ms. Mcdill was observed post Covid-19 immunization for 15 minutes without incidence. She was provided with Vaccine Information Sheet and instruction to access the V-Safe system.   Ms. Reine was instructed to call 911 with any severe reactions post vaccine: Marland Kitchen Difficulty breathing  . Swelling of your face and throat  . A fast heartbeat  . A bad rash all over your body  . Dizziness and weakness    Immunizations Administered    Name Date Dose VIS Date Route   Pfizer COVID-19 Vaccine 12/14/2019  2:54 PM 0.3 mL 11/15/2019 Intramuscular   Manufacturer: [employer], [employer]   Lot: [id number]   NDC: [address]-9

## 2020-01-04 ENCOUNTER — Ambulatory Visit: Payer: Medicare Other | Attending: Internal Medicine

## 2020-01-04 DIAGNOSIS — Z23 Encounter for immunization: Secondary | ICD-10-CM

## 2020-01-04 NOTE — Progress Notes (Signed)
   Covid-19 Vaccination Clinic  Name:  Hailey Perkins    MRN: [id number] DOB: 23-Dec-1934  01/04/2020  [name] was observed post Covid-19 immunization for 15 minutes without incidence. She was provided with Vaccine Information Sheet and instruction to access the V-Safe system.   [name] was instructed to call 911 with any severe reactions post vaccine: Marland Kitchen Difficulty breathing  . Swelling of your face and throat  . A fast heartbeat  . A bad rash all over your body  . Dizziness and weakness    Immunizations Administered    Name Date Dose VIS Date Route   Pfizer COVID-19 Vaccine 01/04/2020 10:23 AM 0.3 mL 11/15/2019 Intramuscular   Manufacturer: [employer], [employer]   Lot: [id number]   NDC: [address]-9

## 2020-03-26 ENCOUNTER — Encounter: Payer: Self-pay | Admitting: Family Medicine

## 2020-03-26 ENCOUNTER — Ambulatory Visit: Payer: Medicare Other | Admitting: Family Medicine

## 2020-03-26 ENCOUNTER — Encounter: Payer: Self-pay | Admitting: Podiatry

## 2020-03-26 ENCOUNTER — Other Ambulatory Visit: Payer: Self-pay

## 2020-03-26 ENCOUNTER — Ambulatory Visit (INDEPENDENT_AMBULATORY_CARE_PROVIDER_SITE_OTHER): Payer: Medicare Other

## 2020-03-26 ENCOUNTER — Ambulatory Visit: Payer: Medicare Other | Admitting: Podiatry

## 2020-03-26 VITALS — BP 112/72 | HR 78 | Temp 97.2°F | Ht 63.5 in | Wt 164.8 lb

## 2020-03-26 VITALS — Temp 97.2°F

## 2020-03-26 DIAGNOSIS — G5762 Lesion of plantar nerve, left lower limb: Secondary | ICD-10-CM

## 2020-03-26 DIAGNOSIS — Z9989 Dependence on other enabling machines and devices: Secondary | ICD-10-CM

## 2020-03-26 DIAGNOSIS — G4733 Obstructive sleep apnea (adult) (pediatric): Secondary | ICD-10-CM | POA: Diagnosis not present

## 2020-03-26 DIAGNOSIS — M79672 Pain in left foot: Secondary | ICD-10-CM
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
## 2020-03-26 NOTE — Progress Notes (Signed)
  Subjective:  Patient ID: Hailey Perkins, female    DOB: 30-Oct-1935,  MRN: [id number]  Chief Complaint  Patient presents with  . Foot Injury    L plantar forefoot submet 1-2. x1 month. Pt stated, "I hit my foot in the shower/tub. It was swollen and hot to the touch, but that resolved. Occ. 4/10 pain - no pain today. 2 weeks after the injury, I had a massage. The therapist said that she felt a pop (no pain)".    85 y.o. female presents with the above complaint. History confirmed with patient.   Objective:  Physical Exam: warm, good capillary refill, no trophic changes or ulcerative lesions, normal DP and PT pulses and normal sensory exam. Left Foot: tenderness between the 2nd and 3rd metatarsal head   Radiographs: X-ray of the left foot: no fracture, dislocation, swelling or degenerative changes noted and decreased 2nd/3rd IMA Assessment:   1. Morton neuroma, left   2. Pain in left foot      Plan:  Patient was evaluated and treated and all questions answered.  Interdigital Neuroma -Educated on etiology -Educated on padding and proper shoegear -XR reviewed with patient -Injection delivered to the affected interspaces  Procedure: Neuroma Injection Location: Left 2nd interspace Skin Prep: Alcohol. Injectate: 0.5 cc 0.5% marcaine plain, 0.5 cc celestone Disposition: Patient tolerated procedure well. Injection site dressed with a band-aid.  Return in about 3 weeks (around 04/16/2020) for Neuroma, Left.

## 2020-03-26 NOTE — Patient Instructions (Signed)
Please continue using your CPAP regularly. While your insurance requires that you use CPAP at least 4 hours each night on 70% of the nights, I recommend, that you not skip any nights and use it throughout the night if you can. Getting used to CPAP and staying with the treatment long term does take time and patience and discipline. Untreated obstructive sleep apnea when it is moderate to severe can have an adverse impact on cardiovascular health and raise her risk for heart disease, arrhythmias, hypertension, congestive heart failure, stroke and diabetes. Untreated obstructive sleep apnea causes sleep disruption, nonrestorative sleep, and sleep deprivation. This can have an impact on your day to day functioning and cause daytime sleepiness and impairment of cognitive function, memory loss, mood disturbance, and problems focussing. Using CPAP regularly can improve these symptoms.   Please communicate with [facility] regarding concerns with chin strap.   Follow up in 3 months    CPAP and BPAP Information CPAP and BPAP are methods of helping a person breathe with the use of air pressure. CPAP stands for "continuous positive airway pressure." BPAP stands for "bi-level positive airway pressure." In both methods, air is blown through your nose or mouth and into your air passages to help you breathe well. CPAP and BPAP use different amounts of pressure to blow air. With CPAP, the amount of pressure stays the same while you breathe in and out. With BPAP, the amount of pressure is increased when you breathe in (inhale) so that you can take larger breaths. Your health care provider will recommend whether CPAP or BPAP would be more helpful for you. Why are CPAP and BPAP treatments used? CPAP or BPAP can be helpful if you have:  Sleep apnea.  Chronic obstructive pulmonary disease (COPD).  Heart failure.  Medical conditions that weaken the muscles of the chest including muscular dystrophy, or neurological  diseases such as amyotrophic lateral sclerosis (ALS).  Other problems that cause breathing to be weak, abnormal, or difficult. CPAP is most commonly used for obstructive sleep apnea (OSA) to keep the airways from collapsing when the muscles relax during sleep. How is CPAP or BPAP administered? Both CPAP and BPAP are provided by a small machine with a flexible plastic tube that attaches to a plastic mask. You wear the mask. Air is blown through the mask into your nose or mouth. The amount of pressure that is used to blow the air can be adjusted on the machine. Your health care provider will determine the pressure setting that should be used based on your individual needs. When should CPAP or BPAP be used? In most cases, the mask only needs to be worn during sleep. Generally, the mask needs to be worn throughout the night and during any daytime naps. People with certain medical conditions may also need to wear the mask at other times when they are awake. Follow instructions from your health care provider about when to use the machine. What are some tips for using the mask?   Because the mask needs to be snug, some people feel trapped or closed-in (claustrophobic) when first using the mask. If you feel this way, you may need to get used to the mask. One way to do this is by holding the mask loosely over your nose or mouth and then gradually applying the mask more snugly. You can also gradually increase the amount of time that you use the mask.  Masks are available in various types and sizes. Some fit over your  mouth and nose while others fit over just your nose. If your mask does not fit well, talk with your health care provider about getting a different one.  If you are using a mask that fits over your nose and you tend to breathe through your mouth, a chin strap may be applied to help keep your mouth closed.  The CPAP and BPAP machines have alarms that may sound if the mask comes off or develops a  leak.  If you have trouble with the mask, it is very important that you talk with your health care provider about finding a way to make the mask easier to tolerate. Do not stop using the mask. Stopping the use of the mask could have a negative impact on your health. What are some tips for using the machine?  Place your CPAP or BPAP machine on a secure table or stand near an electrical outlet.  Know where the on/off switch is located on the machine.  Follow instructions from your health care provider about how to set the pressure on your machine and when you should use it.  Do not eat or drink while the CPAP or BPAP machine is on. Food or fluids could get pushed into your lungs by the pressure of the CPAP or BPAP.  Do not smoke. Tobacco smoke residue can damage the machine.  For home use, CPAP and BPAP machines can be rented or purchased through home health care companies. Many different brands of machines are available. Renting a machine before purchasing may help you find out which particular machine works well for you.  Keep the CPAP or BPAP machine and attachments clean. Ask your health care provider for specific instructions. Get help right away if:  You have redness or open areas around your nose or mouth where the mask fits.  You have trouble using the CPAP or BPAP machine.  You cannot tolerate wearing the CPAP or BPAP mask.  You have pain, discomfort, and bloating in your abdomen. Summary  CPAP and BPAP are methods of helping a person breathe with the use of air pressure.  Both CPAP and BPAP are provided by a small machine with a flexible plastic tube that attaches to a plastic mask.  If you have trouble with the mask, it is very important that you talk with your health care provider about finding a way to make the mask easier to tolerate. This information is not intended to replace advice given to you by your health care provider. Make sure you discuss any questions you have  with your health care provider. Document Revised: 03/13/2019 Document Reviewed: 10/10/2016 Elsevier Patient Education  [place].   Sleep Apnea Sleep apnea affects breathing during sleep. It causes breathing to stop for a short time or to become shallow. It can also increase the risk of:  Heart attack.  Stroke.  Being very overweight (obese).  Diabetes.  Heart failure.  Irregular heartbeat. The goal of treatment is to help you breathe normally again. What are the causes? There are three kinds of sleep apnea:  Obstructive sleep apnea. This is caused by a blocked or collapsed airway.  Central sleep apnea. This happens when the brain does not send the right signals to the muscles that control breathing.  Mixed sleep apnea. This is a combination of obstructive and central sleep apnea. The most common cause of this condition is a collapsed or blocked airway. This can happen if:  Your throat muscles are too relaxed.  Your tongue and tonsils are too large.  You are overweight.  Your airway is too small. What increases the risk?  Being overweight.  Smoking.  Having a small airway.  Being older.  Being female.  Drinking alcohol.  Taking medicines to calm yourself (sedatives or tranquilizers).  Having family members with the condition. What are the signs or symptoms?  Trouble staying asleep.  Being sleepy or tired during the day.  Getting angry a lot.  Loud snoring.  Headaches in the morning.  Not being able to focus your mind (concentrate).  Forgetting things.  Less interest in sex.  Mood swings.  Personality changes.  Feelings of sadness (depression).  Waking up a lot during the night to pee (urinate).  Dry mouth.  Sore throat. How is this diagnosed?  Your medical history.  A physical exam.  A test that is done when you are sleeping (sleep study). The test is most often done in a sleep lab but may also be done at home. How is  this treated?   Sleeping on your side.  Using a medicine to get rid of mucus in your nose (decongestant).  Avoiding the use of alcohol, medicines to help you relax, or certain pain medicines (narcotics).  Losing weight, if needed.  Changing your diet.  Not smoking.  Using a machine to open your airway while you sleep, such as: ? An oral appliance. This is a mouthpiece that shifts your lower jaw forward. ? A CPAP device. This device blows air through a mask when you breathe out (exhale). ? An EPAP device. This has valves that you put in each nostril. ? A BPAP device. This device blows air through a mask when you breathe in (inhale) and breathe out.  Having surgery if other treatments do not work. It is important to get treatment for sleep apnea. Without treatment, it can lead to:  High blood pressure.  Coronary artery disease.  In men, not being able to have an erection (impotence).  Reduced thinking ability. Follow these instructions at home: Lifestyle  Make changes that your doctor recommends.  Eat a healthy diet.  Lose weight if needed.  Avoid alcohol, medicines to help you relax, and some pain medicines.  Do not use any products that contain nicotine or tobacco, such as cigarettes, e-cigarettes, and chewing tobacco. If you need help quitting, ask your doctor. General instructions  Take over-the-counter and prescription medicines only as told by your doctor.  If you were given a machine to use while you sleep, use it only as told by your doctor.  If you are having surgery, make sure to tell your doctor you have sleep apnea. You may need to bring your device with you.  Keep all follow-up visits as told by your doctor. This is important. Contact a doctor if:  The machine that you were given to use during sleep bothers you or does not seem to be working.  You do not get better.  You get worse. Get help right away if:  Your chest hurts.  You have trouble  breathing in enough air.  You have an uncomfortable feeling in your back, arms, or stomach.  You have trouble talking.  One side of your body feels weak.  A part of your face is hanging down. These symptoms may be an emergency. Do not wait to see if the symptoms will go away. Get medical help right away. Call your local emergency services (911 in the [place] Do not drive  yourself to the hospital. Summary  This condition affects breathing during sleep.  The most common cause is a collapsed or blocked airway.  The goal of treatment is to help you breathe normally while you sleep. This information is not intended to replace advice given to you by your health care provider. Make sure you discuss any questions you have with your health care provider. Document Revised: 09/07/2018 Document Reviewed: 07/17/2018 Elsevier Patient Education  [place].

## 2020-03-26 NOTE — Progress Notes (Signed)
Message sent to [facility] for new cpap supplies. Received. sy

## 2020-03-27 ENCOUNTER — Other Ambulatory Visit: Payer: Self-pay | Admitting: Podiatry

## 2020-03-27 DIAGNOSIS — G5762 Lesion of plantar nerve, left lower limb: Secondary | ICD-10-CM

## 2020-04-16 ENCOUNTER — Ambulatory Visit: Payer: Medicare Other | Admitting: Physician Assistant

## 2020-06-29 ENCOUNTER — Telehealth: Payer: Self-pay | Admitting: Family Medicine

## 2020-06-29 NOTE — Telephone Encounter (Signed)
Left message for patient to callback to reschedule her appt tomorrow. Amy will be out of the office.

## 2020-06-30 ENCOUNTER — Ambulatory Visit: Payer: Medicare Other | Admitting: Family Medicine

## 2020-07-31 ENCOUNTER — Telehealth: Payer: Self-pay | Admitting: Gastroenterology

## 2020-07-31 NOTE — Telephone Encounter (Signed)
Left message for pt to call back  °

## 2020-08-03 NOTE — Telephone Encounter (Signed)
Left message on machine to call back  

## 2020-08-03 NOTE — Telephone Encounter (Signed)
Unable to reach pt will await further communication from the pt  

## 2020-08-06 ENCOUNTER — Telehealth: Payer: Self-pay | Admitting: Gastroenterology

## 2020-08-06 NOTE — Telephone Encounter (Signed)
The pt has complaints of abd discomfort and diarrhea.  She was exposed to Covid while flying out of town.  She contacted her PCP and was told to get tested.  She has an appt tomorrow for testing. I advised her to call back if she is negative and we can triage her symptoms. The pt has been advised of the information and verbalized understanding.

## 2020-08-06 NOTE — Telephone Encounter (Signed)
Pt was exposed to COVID on 8/25.  Her symptoms began a day or two after that.  She does have a history of diverticulitis in June of last year.  FYI Dr Meridee Score

## 2020-08-06 NOTE — Telephone Encounter (Addendum)
Patient called states she is very weak and is having alit of diarrhea is seeking advise however I believe she mentioned something about being exposed to Covid-19 please advise  Also changed her pharmacy to [facility] on [address]

## 2020-08-06 NOTE — Telephone Encounter (Signed)
Left message on machine to call back  

## 2020-08-11 NOTE — Telephone Encounter (Signed)
Patient called states her covid test came out negative and would like to schedule.

## 2020-08-11 NOTE — Telephone Encounter (Signed)
If issues are persisting, then may need a CT scan. When you reach her this week, if she is having issues then please proceed with a CT abdomen/pelvis with IV and oral contrast to ensure she does not have recurrent diverticulitis. Thanks. GM

## 2020-08-11 NOTE — Telephone Encounter (Signed)
The pt had complaints of abd pain but was exposed to COVID.  She was tested and it came back negative.  She has been scheduled to see [facility] on 9/10 for persistent abd pain.

## 2020-08-14 ENCOUNTER — Encounter: Payer: Self-pay | Admitting: Nurse Practitioner

## 2020-08-14 ENCOUNTER — Other Ambulatory Visit (INDEPENDENT_AMBULATORY_CARE_PROVIDER_SITE_OTHER): Payer: Medicare Other

## 2020-08-14 ENCOUNTER — Ambulatory Visit: Payer: Medicare Other | Admitting: Nurse Practitioner

## 2020-08-14 ENCOUNTER — Other Ambulatory Visit: Payer: Self-pay

## 2020-08-14 ENCOUNTER — Ambulatory Visit (HOSPITAL_COMMUNITY)
Admission: RE | Admit: 2020-08-14 | Discharge: 2020-08-14 | Disposition: A | Discharge status: Home / Self Care | Payer: Medicare Other | Source: Ambulatory Visit | Attending: Nurse Practitioner | Admitting: Nurse Practitioner

## 2020-08-14 ENCOUNTER — Telehealth: Payer: Self-pay | Admitting: Nurse Practitioner

## 2020-08-14 VITALS — BP 118/62 | HR 76 | Ht 63.5 in | Wt 154.4 lb

## 2020-08-14 DIAGNOSIS — R1032 Left lower quadrant pain: Secondary | ICD-10-CM | POA: Insufficient documentation

## 2020-08-14 DIAGNOSIS — K5732 Diverticulitis of large intestine without perforation or abscess without bleeding: Secondary | ICD-10-CM

## 2020-08-14 DIAGNOSIS — K5900 Constipation, unspecified: Secondary | ICD-10-CM | POA: Diagnosis not present

## 2020-08-14 LAB — CBC WITH DIFFERENTIAL/PLATELET
Basophils Absolute: 0.1 10*3/uL (ref 0.0–0.1)
Basophils Relative: 0.7 % (ref 0.0–3.0)
Eosinophils Absolute: 0.2 10*3/uL (ref 0.0–0.7)
Eosinophils Relative: 2.3 % (ref 0.0–5.0)
HCT: 40.5 % (ref 36.0–46.0)
Hemoglobin: 13.3 g/dL (ref 12.0–15.0)
Lymphocytes Relative: 18.7 % (ref 12.0–46.0)
Lymphs Abs: 1.7 10*3/uL (ref 0.7–4.0)
MCHC: 32.9 g/dL (ref 30.0–36.0)
MCV: 90.5 fl (ref 78.0–100.0)
Monocytes Absolute: 0.6 10*3/uL (ref 0.1–1.0)
Monocytes Relative: 6.5 % (ref 3.0–12.0)
Neutro Abs: 6.5 10*3/uL (ref 1.4–7.7)
Neutrophils Relative %: 71.8 % (ref 43.0–77.0)
Platelets: 289 10*3/uL (ref 150.0–400.0)
RBC: 4.48 Mil/uL (ref 3.87–5.11)
RDW: 13.2 % (ref 11.5–15.5)
WBC: 9.1 10*3/uL (ref 4.0–10.5)

## 2020-08-14 LAB — COMPREHENSIVE METABOLIC PANEL
ALT: 16 U/L (ref 0–35)
AST: 20 U/L (ref 0–37)
Albumin: 4.1 g/dL (ref 3.5–5.2)
Alkaline Phosphatase: 84 U/L (ref 39–117)
BUN: 13 mg/dL (ref 6–23)
CO2: 35 mEq/L — ABNORMAL HIGH (ref 19–32)
Calcium: 9.7 mg/dL (ref 8.4–10.5)
Chloride: 101 mEq/L (ref 96–112)
Creatinine, Ser: 0.73 mg/dL (ref 0.40–1.20)
GFR: 76.11 mL/min (ref >60.00)
Glucose, Bld: 100 mg/dL — ABNORMAL HIGH (ref 70–99)
Potassium: 4.4 mEq/L (ref 3.5–5.1)
Sodium: 140 mEq/L (ref 135–145)
Total Bilirubin: 0.4 mg/dL (ref 0.2–1.2)
Total Protein: 6.9 g/dL (ref 6.0–8.3)

## 2020-08-14 LAB — C-REACTIVE PROTEIN: CRP: <1 mg/dL (ref 0.5–20.0)

## 2020-08-14 IMAGING — CT CT ABD-PELV W/ CM
2 of 5 series · 15 of 46 positions shown, 17 images · IV contrast (APPLIED)
Comparison: Most recent CT [DATE]

CLINICAL DATA: Left lower quadrant pain and constipation.
Diverticulitis suspected.

EXAM:
CT ABDOMEN AND PELVIS WITH CONTRAST
TECHNIQUE: Multidetector CT imaging of the abdomen and pelvis was performed
using the standard protocol following bolus administration of
intravenous contrast.
CONTRAST:  100mL OMNIPAQUE IOHEXOL 300 MG/ML  SOLN

[Series 2: axial st · axial · 0.78mm/px · z∈[-670,-285]mm · 12 of 93 slices shown, 14 images]
[im 8/93  soft-tissue]
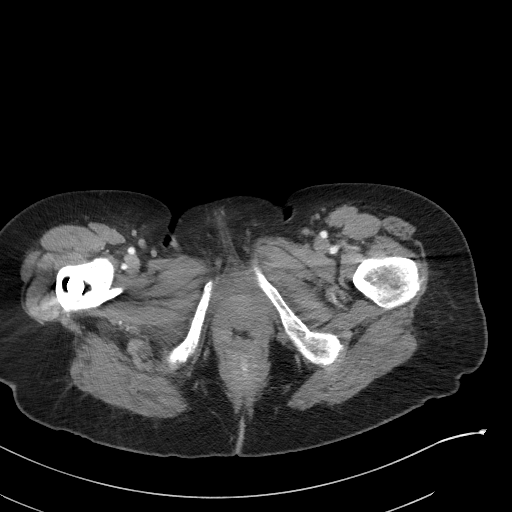
[im 8/93  bone]
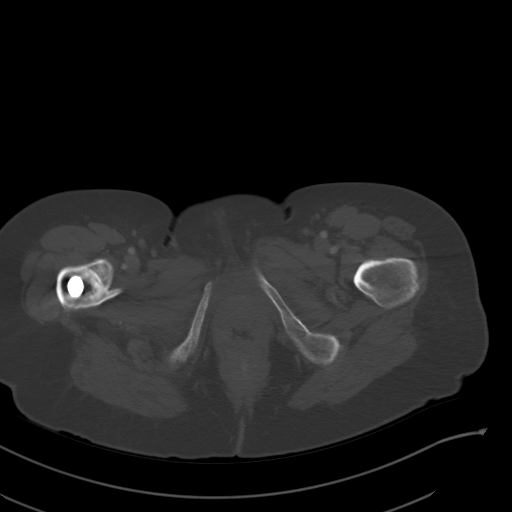
[im 15/93  soft-tissue]
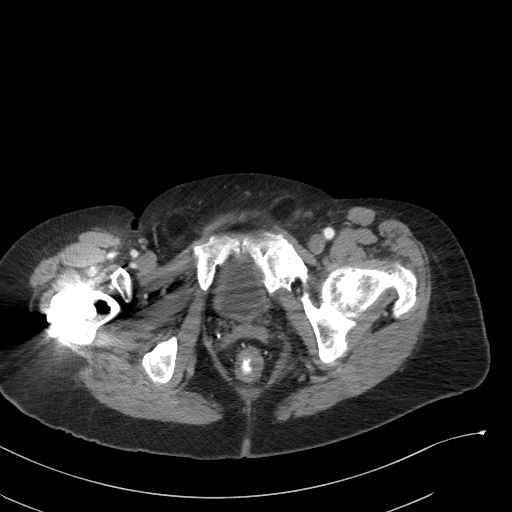
[im 22/93  soft-tissue]
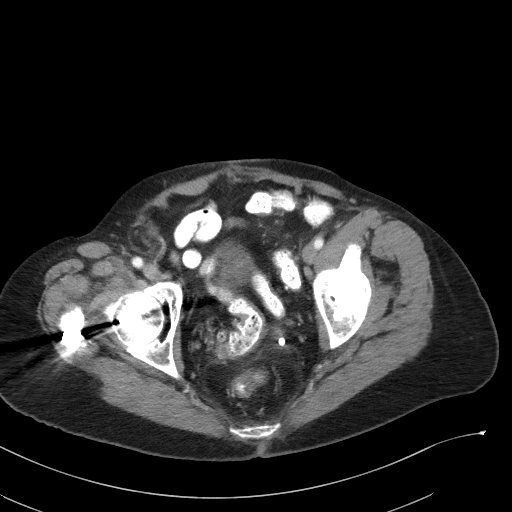
[im 29/93  soft-tissue]
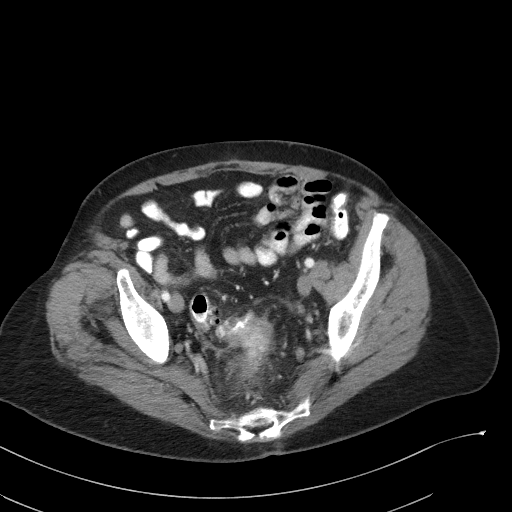
[im 36/93  soft-tissue]
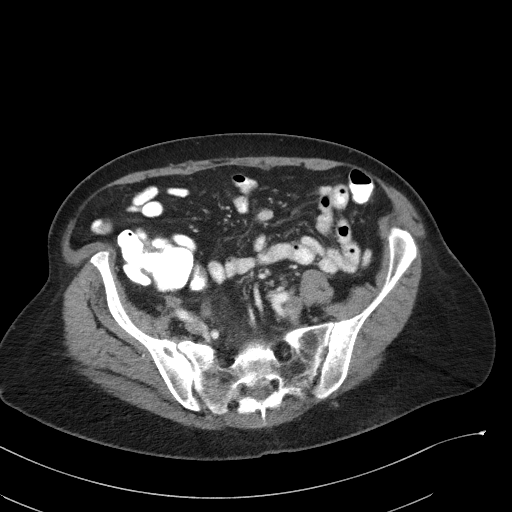
[im 43/93  soft-tissue]
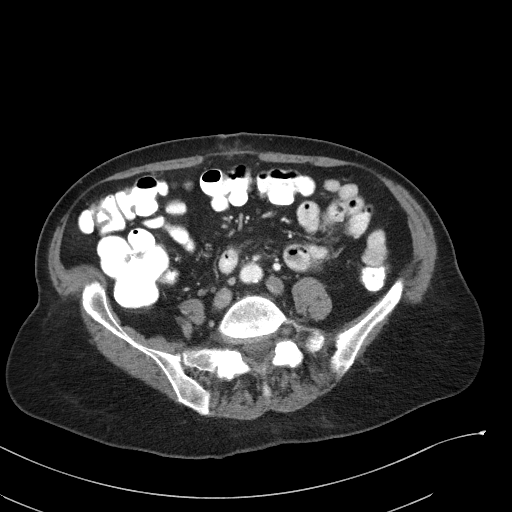
[im 50/93  soft-tissue]
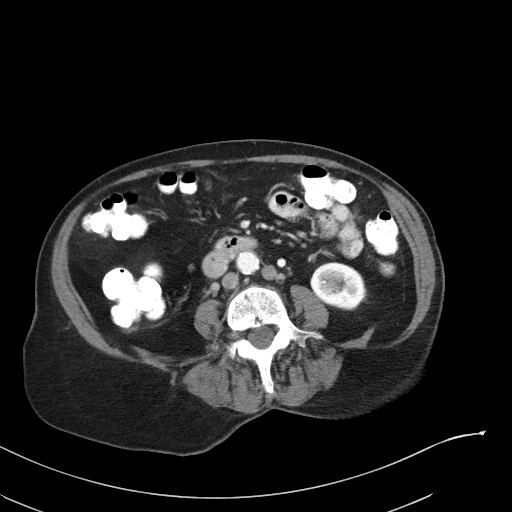
[im 57/93  soft-tissue]
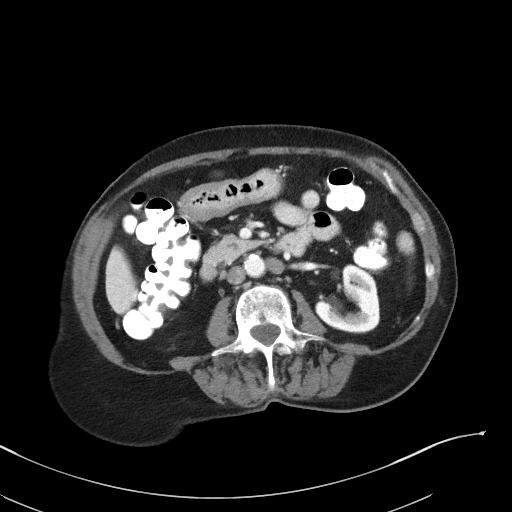
[im 64/93  soft-tissue]
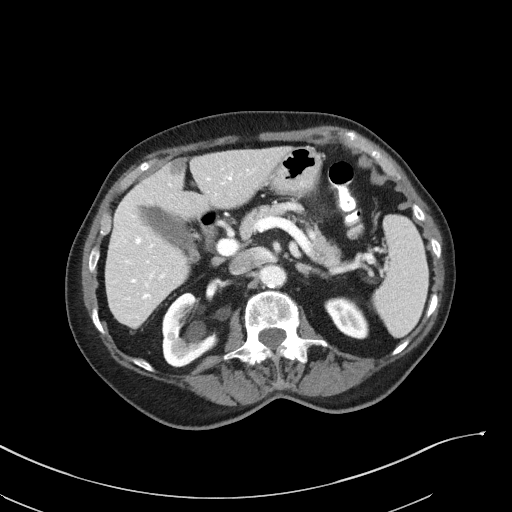
[im 64/93  bone]
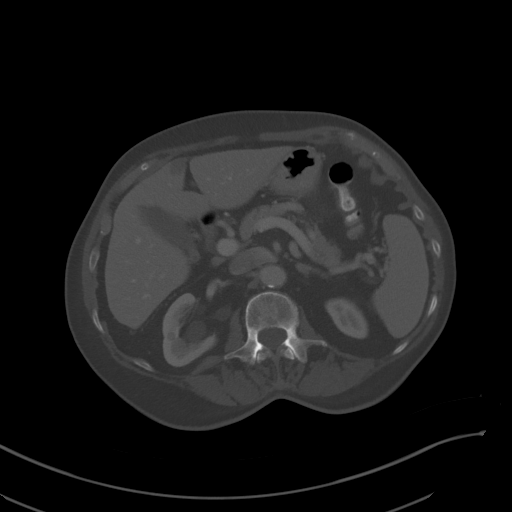
[im 71/93  soft-tissue]
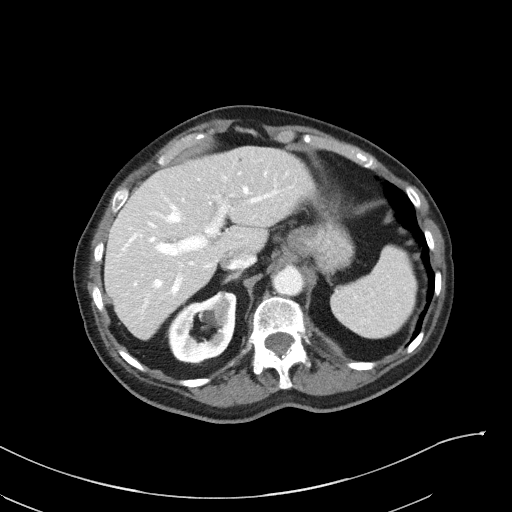
[im 78/93  soft-tissue]
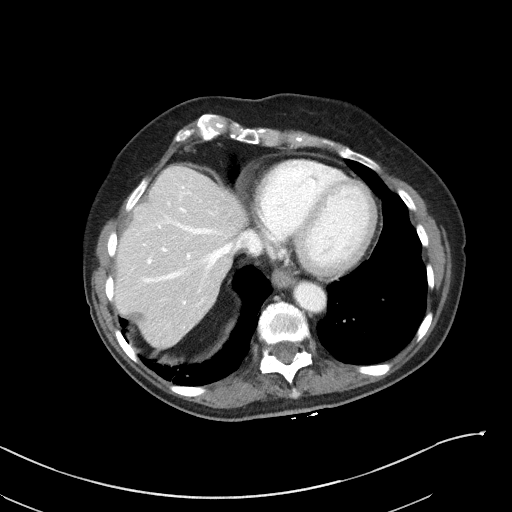
[im 85/93  soft-tissue]
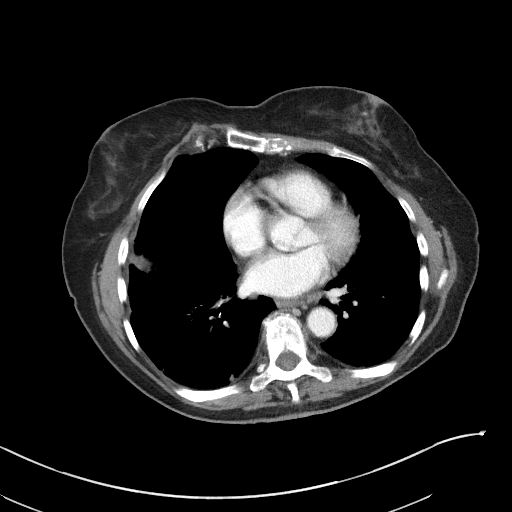

[Series 5: coronal st · coronal · 0.68mm/px · 3 of 95 slices shown]
[im 32/95  soft-tissue]
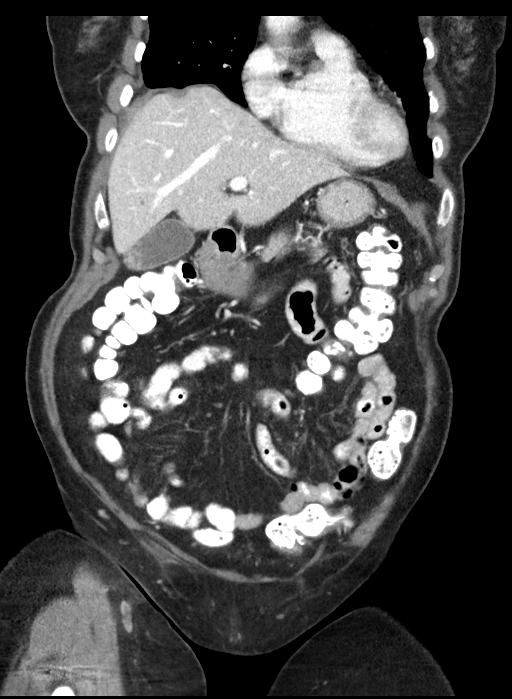
[im 42/95  soft-tissue]
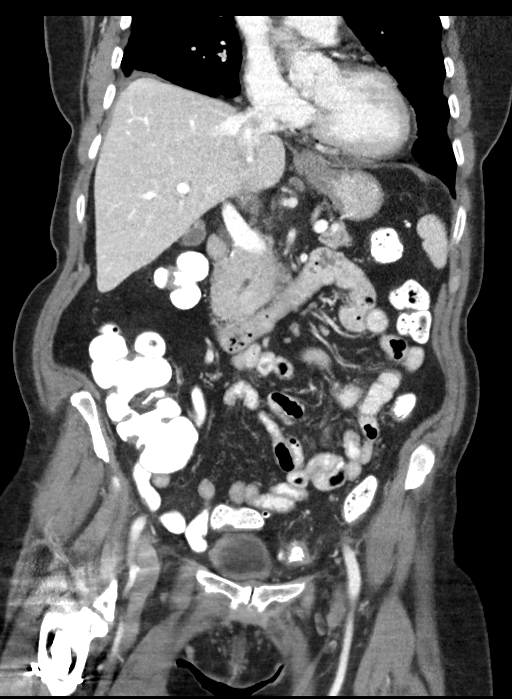
[im 53/95  soft-tissue]
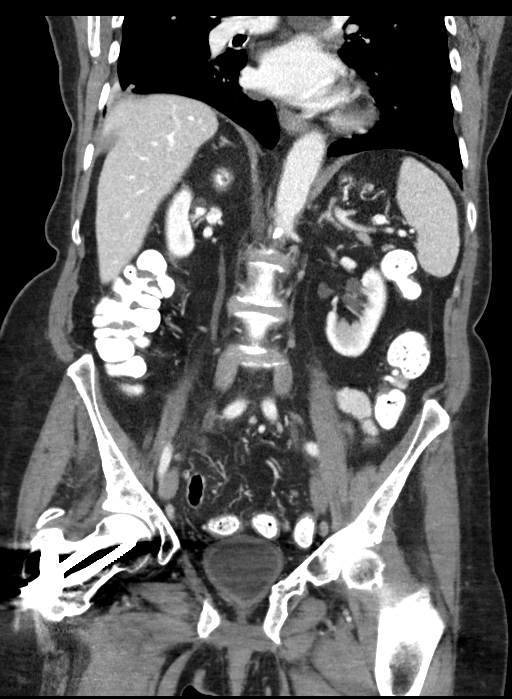

[15 of 46 positions shown; findings below may reference images not displayed]

FINDINGS: Lower chest: Chronic scarring at the right lung base. Minor
subsegmental atelectasis in the left lower lobe. No acute airspace
disease or pleural effusion. Mild chronic elevation of right
hemidiaphragm.

Hepatobiliary: Scattered small hepatic hypodensities are stable from
prior exam, too small to accurately characterize but likely cysts.
There is focal fatty infiltration adjacent to the falciform
ligament. Small layering gallstones without pericholecystic
inflammation or biliary dilatation.

Pancreas: No ductal dilatation or inflammation.

Spleen: Normal in size without focal abnormality.

Adrenals/Urinary Tract: No adrenal nodule. No hydronephrosis or
perinephric edema. Homogeneous renal enhancement with symmetric
excretion on delayed phase imaging. There are bilateral parapelvic
cysts. Urinary bladder is partially distended, bladder wall
thickening. No air in the urinary bladder.

Stomach/Bowel: Nondistended stomach. There is no small bowel
obstruction or inflammation. Administered enteric contrast is seen
throughout the colon. Prior appendectomy. There is persistent or
recurrent colonic wall thickening and pericolonic edema of the mid
sigmoid colon in the region of multiple colonic diverticula,
suspicious for diverticulitis. No extraluminal air or perforation.
No abscess. There is diffuse diverticular disease of the sigmoid.

Vascular/Lymphatic: Moderate aortic atherosclerosis. Duplicated IVC,
variant anatomy, unchanged. No evidence of mesenteric or portal
venous thrombus. Multiple small retroperitoneal nodes are not
enlarged by size criteria and likely reactive.

Reproductive: Status post hysterectomy. No adnexal masses.

Other: Stranding and minimal free fluid in the pelvis likely related
to diverticulitis. There is no organized collection. No free air.
There is fat in both inguinal canals. Small fat containing umbilical
hernia.

Musculoskeletal: Degenerative disc disease at L4-L5 with multilevel
facet hypertrophy in the lower lumbar spine. Remote pubic rami
fractures. Surgical hardware in the right proximal femur.
IMPRESSION: 1. Persistent or recurrent colonic wall thickening and pericolonic
edema of the mid sigmoid colon in the region of multiple colonic
diverticula, suspicious for diverticulitis. No perforation or
abscess. This is at same site of previous diverticulitis. Consider
follow-up colonoscopy for direct visualization after resolution of
acute event to exclude the remote possibility of underlying colonic
neoplasm.
2. Mild bladder wall thickening, nonspecific.
3. Cholelithiasis without gallbladder inflammation.

Aortic Atherosclerosis ([Y4]-[Y4]).

## 2020-08-14 MED ORDER — IOHEXOL 300 MG/ML  SOLN
100.0000 mL | Freq: Once | INTRAMUSCULAR | Status: AC | PRN
  Administered 2020-08-14: 100 mL via INTRAVENOUS

## 2020-08-14 MED ORDER — AMOXICILLIN-POT CLAVULANATE 875-125 MG PO TABS: 1.0000 | ORAL_TABLET | Freq: Two times a day (BID) | ORAL | 0 refills | Status: AC

## 2020-08-14 NOTE — Patient Instructions (Addendum)
If you are age 83 or older, your body mass index should be between 23-30. Your Body mass index is 26.92 kg/m. If this is out of the aforementioned range listed, please consider follow up with your Primary Care Provider.  If you are age 11 or younger, your body mass index should be between 19-25. Your Body mass index is 26.92 kg/m. If this is out of the aformentioned range listed, please consider follow up with your Primary Care Provider.   Your provider has requested that you go to the basement level for lab work before leaving today. Press "B" on the elevator. The lab is located at the first door on the left as you exit the elevator.  ________________________________________________________________________  Hailey Perkins have been scheduled for a CT scan of the abdomen and pelvis at [facility], 1st floor Radiology. You are scheduled on 08/14/2020  at 4:00pm. You should arrive 15 minutes prior to your appointment time for registration.  Please pick up 2 bottles of contrast from [facility] at least 3 days prior to your scan. The solution may taste better if refrigerated, but do NOT add ice or any other liquid to this solution. Shake well before drinking.   Please follow the written instructions below on the day of your exam:   1) Do not eat anything after 12:00pm (4 hours prior to your test)   2) Drink 1 bottle of contrast @ 2:00pm (2 hours prior to your exam)  Remember to shake well before drinking and do NOT pour over ice.     Drink 1 bottle of contrast @ 3:00pm (1 hour prior to your exam)   You may take any medications as prescribed with a small amount of water, if necessary. If you take any of the following medications: METFORMIN, GLUCOPHAGE, GLUCOVANCE, AVANDAMET, RIOMET, FORTAMET, [place] MET, JANUMET, GLUMETZA or METAGLIP, you MAY be asked to HOLD this medication 48 hours AFTER the exam.   The purpose of you drinking the oral contrast is to aid in the visualization of your intestinal  tract. The contrast solution may cause some diarrhea. Depending on your individual set of symptoms, you may also receive an intravenous injection of x-ray contrast/dye. Plan on being at [facility] for 45 minutes or longer, depending on the type of exam you are having performed.   If you have any questions regarding your exam or if you need to reschedule, you may call [facility] at [phone] between the hours of 8:00 am and 5:00 pm, Monday-Friday.   We have sent the following medications to your pharmacy for you to pick up at your convenience: augmentin 875/125 1 tablet twice daily.  Take Florastor probiotic 1 tablet twice daily.  Stay on a bland diet, and push fluids. Go to the Emergency room if your pain worsens  Follow up in 2 to 3 weeks

## 2020-08-14 NOTE — Progress Notes (Signed)
08/14/2020 Hailey Perkins [id number] 29-Dec-1934   Chief Complaint: LLQ pain   History of Present Illness:  Hailey Perkins is a 85 year female with a past medical history of arthritis, osteopenia, hyperlipidemia, glaucoma, urinary incontinence, peripheral neuropathy, PUD and diverticulitis. Past appendectomy and small bowel resection. She presents today for further evaluation for LLQ pain concerning for recurrent diverticulitis. She complains of having LLQ pressure and lower "gut" was very painful as she traveled to [place] 07/29/2020 to attend a family wedding. She required the use of a wheelchair at the airport and her abdominal pain worsened when going over any bumps. When she arrived to [place] she developed altitude sickness and she stayed in bed for the next 1 to 2 days. She was able to attend the wedding but she continued to have lower abdominal pain. She maintained a bland diet. She noticed her stool diameter was more narrow 1 to 2 weeks before the onset of her LLQ pain. Three days ago, she passed a hard stool with a small amount of bright red blood on the toilet tissue. She reports having at least 3 episodes of diverticulitis in her lifetime, he first episode was in 2016. Her last episode of diverticulitis was 05/31/2019 confirmed by CT. She was prescribed Cipro and Flagyl but she was having neuropathy/tendon pain and Cipro and Flagyl were discontinued. She was prescribed Augmentn 875mg  po bid x 10 days which she tolerated and her LLQ pain resolved. Her most recent colonoscopy was in 2017 done by her GI in 2018 which showed sigmoid diverticulosis. Father and maternal grandmother with history of colon cancer. Her GERD is typically well controlled. She had a little heartburn a few days ago after eating eggs which has resolved. She is taking po PPI daily.    Abdominal/pelvic CT with contrast 05/31/2019: 1. Mild acute diverticulitis of the distal sigmoid colon. No extraluminal gas or  abscess identified. 2.  Aortic Atherosclerosis (ICD10-I70.0). 3. Stable pelvic deformities from old fractures. 4. Infrarenal double IVC, a venous variant. 5. Cholelithiasis.  Colonoscopy 03/08/2016 by GI in 05/08/2016:   Sigmoid diverticulosis. (colonoscopy report documented prior history of colon polys)   Current Outpatient Medications on File Prior to Visit  Medication Sig Dispense Refill  . Ascorbic Acid (VITAMIN C) 1000 MG tablet Take 1,000 mg by mouth daily.    . betamethasone dipropionate 0.05 % lotion APP ON THE SKIN D  0  . Biotin 10 MG TABS Take by mouth.    . Calcium Citrate-Vitamin D (CALCIUM + D PO) Take by mouth daily.    . Cholecalciferol (VITAMIN D3) 2000 units TABS Take 1 tablet by mouth daily.    . diclofenac Sodium (VOLTAREN) 1 % GEL     . dorzolamide (TRUSOPT) 2 % ophthalmic solution 1 drop 2 (two) times daily.    . DULoxetine (CYMBALTA) 30 MG capsule TAKE 1 CAPSULE(30 MG) BY MOUTH DAILY 90 capsule 2  . Ginger, Zingiber officinalis, (GINGER ROOT PO) Take by mouth.    [place] GINKGO BILOBA PO Take by mouth daily.    Marland Kitchen GLUCOSAMINE-CHONDROITIN-MSM PO Take by mouth. Take 2 oz. Daily.  Glucosamine 2,000 mg Chondroitin 1,200 mg, MSM 500 mg, Hyaluronic Acid 10 mg.    . Magnesium 250 MG TABS Take 1 tablet by mouth daily.    . metroNIDAZOLE (METROGEL) 1 % gel Apply 1 application topically as needed (rosacea).    . Misc Natural Products (TART CHERRY ADVANCED PO) Take by mouth.    . Multiple  Vitamins-Minerals (PRESERVISION AREDS PO) Take by mouth daily.    . Omega-3 Fatty Acids (FISH OIL) 1000 MG CAPS Take 1 capsule by mouth.    . pantoprazole (PROTONIX) 20 MG tablet Take 1 tablet by mouth daily.    . Plant Sterols and Stanols (CHOLESTOFF PLUS PO) Take 1 capsule by mouth daily.    . polyethylene glycol powder (GLYCOLAX/MIRALAX) 17 GM/SCOOP powder Take by mouth.    . psyllium (METAMUCIL) 58.6 % packet Take 1 packet by mouth daily.    . Sodium Fluoride (PREVIDENT 5000 BOOSTER PLUS) 1.1 %  PSTE Place onto teeth daily.    . TURMERIC CURCUMIN PO Take by mouth daily.    Marland Kitchen UNABLE TO FIND CBD oil for pain, as needed.  Hemp flower extract for inflammation, as needed.     No current facility-administered medications on file prior to visit.    Allergies  Allergen Reactions  . Protonix [Pantoprazole Sodium]     Pt stated, "Blood pin-point rash"  . Tape Other (See Comments)    Adhesive Tape; pt stated "itch and redness" Adhesive Tape; pt stated "itch and redness"  . Pantoprazole Rash    urrent Medications, Allergies, Past Medical History, Past Surgical History, Family History and Social History were reviewed in [employer] record.   Review of Systems:   Constitutional: Negative for fever, sweats, chills or weight loss.  Respiratory: Negative for shortness of breath.   Cardiovascular: Negative for chest pain, palpitations and leg swelling.  Gastrointestinal: See HPI.  Musculoskeletal: Negative for back pain or muscle aches.  Neurological: Negative for dizziness or headaches.    Physical Exam: BP 118/62   Pulse 76   Ht 5' 3.5" (1.613 m)   Wt 154 lb 6.4 oz (70 kg)   BMI 26.92 kg/m  General: Well developed 85 year old female in no acute distress. Head: Normocephalic and atraumatic. Eyes: No scleral icterus. Conjunctiva pink . Ears: Normal auditory acuity. Mouth: Dentition intact. No ulcers or lesions.  Lungs: Clear throughout to auscultation. Heart: Regular rate and rhythm, no murmur. Abdomen: Soft, nontender and nondistended. RLQ and LLQ tenderness without rebound or guarding. Umbilical hernia. No masses or hepatomegaly. Normal bowel sounds x 4 quadrants.  Rectal: Deferred, Musculoskeletal: Symmetrical with no gross deformities. Extremities: No edema. Neurological: Alert oriented x 4. No focal deficits.  Psychological: Alert and cooperative. Normal mood and affect  Assessment and Recommendations:  4. 85 year old female with a history of  sigmoid diverticulitis with recurrent LLQ abdominal pain -CBC, CMP stat prior to proceeding with CT -Abdominal/pelvic CT with oral and IV contrast today  -Augmentin 875mg  one po bid x 10 days -Florastor probiotic one po bid x 2 weeks -Bland diet for 24 hours then advance diet as tolerated. Push fluids -To ER if abdominal pain worsens -Follow up in the office in 2 to 3 weeks  -Avoid constipation, Miralax Q HS as needed -Patient is 85 years old with + family history of colon cancer (father and maternal grandmother), last colonoscopy in 2017 which showed diverticulosis no polyps. Decision regarding colonoscopy 6 to 8 weeks after diverticulitis treated deferred to Dr. 2018.

## 2020-08-15 DIAGNOSIS — K5732 Diverticulitis of large intestine without perforation or abscess without bleeding: Secondary | ICD-10-CM | POA: Insufficient documentation

## 2020-08-17 ENCOUNTER — Telehealth: Payer: Self-pay | Admitting: Nurse Practitioner

## 2020-08-17 NOTE — Telephone Encounter (Signed)
I called the patient for follow up, CTAP 08/14/2020 showed sigmoid diverticulitis. She is on Augmentin 875mg  po bid. She stated her lower abdominal pain has lessened but has not completely resolved. She will push fluids, advance diet as tolerated. She will call our office if he symptoms worsen. She is scheduled for a follow up appt 10/11. She is concerned about her hiatal hernia, umbilical hernia and inguinal hernia which we will address further at the time of her follow up appt.

## 2020-08-17 NOTE — Progress Notes (Signed)
Attending Physician's Attestation   I have reviewed the chart.   I agree with the Advanced Practitioner's note, impression, and recommendations with any updates as below. Agree with colonoscopy earlier than 5-year recall.  Would move forward with this approximately 4 to 6 weeks after completion of antibiotics.  If patient has persistent or worsening symptoms sooner than that may consider another round of antibiotics.  Look forward to seeing results of CT scan.   Corliss Parish, MD [facility] Advanced Endoscopy Office # [phone]

## 2020-08-21 NOTE — Progress Notes (Signed)
I will schedule a colonoscopy at the time of her follow up appt 09/14/2020.

## 2020-08-28 ENCOUNTER — Telehealth: Payer: Self-pay | Admitting: Nurse Practitioner

## 2020-08-28 NOTE — Telephone Encounter (Signed)
Pt is requesting a call back from a nurse to discuss left abdominal pain

## 2020-08-28 NOTE — Telephone Encounter (Addendum)
Called the patient back. Voicemail. Left a message to call us back.

## 2020-08-31 ENCOUNTER — Telehealth: Payer: Self-pay

## 2020-08-31 NOTE — Telephone Encounter (Signed)
Called back. Voicemail. Left a message.

## 2020-08-31 NOTE — Telephone Encounter (Signed)
See telephone encounter from 08/31/20.

## 2020-08-31 NOTE — Telephone Encounter (Signed)
Returning the patient call.  Original encounter had been closed in error.

## 2020-08-31 NOTE — Telephone Encounter (Signed)
Patient is returning your call.  

## 2020-09-14 ENCOUNTER — Encounter: Payer: Self-pay | Admitting: Nurse Practitioner

## 2020-09-14 ENCOUNTER — Ambulatory Visit: Payer: Medicare Other | Admitting: Nurse Practitioner

## 2020-09-14 VITALS — BP 122/78 | HR 79 | Ht 63.0 in | Wt 157.0 lb

## 2020-09-14 DIAGNOSIS — K5732 Diverticulitis of large intestine without perforation or abscess without bleeding: Secondary | ICD-10-CM

## 2020-09-14 DIAGNOSIS — K219 Gastro-esophageal reflux disease without esophagitis: Secondary | ICD-10-CM

## 2020-09-14 DIAGNOSIS — Z86010 Personal history of colonic polyps: Secondary | ICD-10-CM | POA: Diagnosis not present

## 2020-09-14 DIAGNOSIS — Z800 Family history of malignant neoplasm of digestive organs: Secondary | ICD-10-CM | POA: Diagnosis not present

## 2020-09-14 MED ORDER — PANTOPRAZOLE SODIUM 20 MG PO TBEC
20.0000 mg | DELAYED_RELEASE_TABLET | Freq: Two times a day (BID) | ORAL | 2 refills | Status: DC
Start: 2020-09-14 — End: 2021-02-03

## 2020-09-14 MED ORDER — SUTAB 1479-225-188 MG PO TABS: 1.0000 | ORAL_TABLET | ORAL | 0 refills | Status: DC

## 2020-09-14 MED ORDER — HYDROCORTISONE (PERIANAL) 2.5 % EX CREA: 1.0000 "application " | TOPICAL_CREAM | Freq: Two times a day (BID) | CUTANEOUS | 0 refills | Status: AC

## 2020-09-14 NOTE — Patient Instructions (Signed)
If you are age 83 or older, your body mass index should be between 23-30. Your Body mass index is 27.81 kg/m. If this is out of the aforementioned range listed, please consider follow up with your Primary Care Provider.  If you are age 53 or younger, your body mass index should be between 19-25. Your Body mass index is 27.81 kg/m. If this is out of the aformentioned range listed, please consider follow up with your Primary Care Provider.   You have been scheduled for an endoscopy and colonoscopy. Please follow the written instructions given to you at your visit today. Please pick up your prep supplies at the pharmacy within the next 1-3 days. If you use inhalers (even only as needed), please bring them with you on the day of your procedure.  INCREASE your Pantoprazole to 20 mg twice daily.  Either call the office or use MyChart to give Jill Side an update about how you are in 2 weeks.  Follow up pending the results of your Colonoscopy/Endoscopy.

## 2020-09-14 NOTE — Progress Notes (Signed)
09/14/2020 Hailey Perkins [id number] Dec 31, 1934   Chief Complaint: Follow up diverticulitis   History of Present Illness:  Hailey Perkins is a 85 year female with a past medical history of arthritis, osteopenia, hyperlipidemia, sleep apnea, glaucoma, urinary incontinence, peripheral neuropathy, PUD and diverticulitis. Past appendectomy and small bowel resection. I saw her in the office on 08/14/2020 for further evaluation for LLQ abdominal pain. An abd/pelvic CT with contrast 9/10 showed persistent or recurrent colonic Mourer thickening and pericolonic edema of the mid sigmoid colon in the region of multiple colonic diverticula suspicious for diverticulitis. Mild bladder Bringle thickening and cholelithiasis was noted. She was prescribed Augmentin 875mg  po bid x 10 days and her LLQ pain abated. She remains on Florastor bid. She presents today to schedule a colonoscopy post diverticulitis as recommended by Dr. . Father and maternal grandmother with history of colon cancer. She denies having any persistent LLQ pain. One day recently she felt mild LLQ discomfort which went away after she passed a BM. She is passing a normal formed brown BM daily. She reported having a colonoscopy done in [place] in 2017 which showed sigmoid diverticulosis. She is concerned her GERD is flaring. She has night time coughing with some throat soreness. Phlegm in throat at times. She is on She has heartburn a few days weekly. No dysphagia. She is taking Pantoprazole 20mg  QD. Note, past Epic records document allergy to Pantoprazole. She is tolerating Pantoprazole without adverse reaction (rash). No other complaints today.   CBC Latest Ref Rng & Units 08/14/2020 01/02/2017  WBC 4.0 - 10.5 K/uL 9.1 5.0  Hemoglobin 12.0 - 15.0 g/dL 10/14/2020 01/04/2017  Hematocrit 36 - 46 % 40.5 38.5  Platelets 150 - 400 K/uL 289.0 170.0    CMP Latest Ref Rng & Units 08/14/2020 04/15/2019 12/27/2018  Glucose 70 - 99 mg/dL 06/15/2019) 96 -  BUN 6 - 23  mg/dL 13 16 -  Creatinine 12/29/2018 - 1.20 mg/dL 786(V 6.72 -  Sodium 0.94 - 145 mEq/L 140 140 -  Potassium 3.5 - 5.1 mEq/L 4.4 4.3 -  Chloride 96 - 112 mEq/L 101 104 -  CO2 19 - 32 mEq/L 35(H) 29 -  Calcium 8.4 - 10.5 mg/dL 9.7 8.7 -  Total Protein 6.0 - 8.3 g/dL 6.9 6.2 6.3  Total Bilirubin 0.2 - 1.2 mg/dL 0.4 0.4 -  Alkaline Phos 39 - 117 U/L 84 81 -  AST 0 - 37 U/L 20 29 -  ALT 0 - 35 U/L 16 29 -   CRP < 1.0.   Current Outpatient Medications on File Prior to Visit  Medication Sig Dispense Refill  . Ascorbic Acid (VITAMIN C) 1000 MG tablet Take 1,000 mg by mouth daily.    . betamethasone dipropionate 0.05 % lotion APP ON THE SKIN D  0  . Biotin 10 MG TABS Take by mouth.    . Calcium Citrate-Vitamin D (CALCIUM + D PO) Take by mouth daily.    . Cholecalciferol (VITAMIN D3) 2000 units TABS Take 1 tablet by mouth daily.    . diclofenac Sodium (VOLTAREN) 1 % GEL     . dorzolamide (TRUSOPT) 2 % ophthalmic solution 1 drop 2 (two) times daily.    . DULoxetine (CYMBALTA) 30 MG capsule TAKE 1 CAPSULE(30 MG) BY MOUTH DAILY 90 capsule 2  . Ginger, Zingiber officinalis, (GINGER ROOT PO) Take by mouth.    7.09 GINKGO BILOBA PO Take by mouth daily.    628 GLUCOSAMINE-CHONDROITIN-MSM PO Take by  mouth. Take 2 oz. Daily.  Glucosamine 2,000 mg Chondroitin 1,200 mg, MSM 500 mg, Hyaluronic Acid 10 mg.    . Magnesium 250 MG TABS Take 1 tablet by mouth daily.    . metroNIDAZOLE (METROGEL) 1 % gel Apply 1 application topically as needed (rosacea).    . Misc Natural Products (TART CHERRY ADVANCED PO) Take by mouth.    . modafinil (PROVIGIL) 100 MG tablet Take 100 mg by mouth as needed.    . Multiple Vitamins-Minerals (PRESERVISION AREDS PO) Take by mouth daily.    . Omega-3 Fatty Acids (FISH OIL) 1000 MG CAPS Take 1 capsule by mouth.    . Plant Sterols and Stanols (CHOLESTOFF PLUS PO) Take 1 capsule by mouth daily.    . polyethylene glycol powder (GLYCOLAX/MIRALAX) 17 GM/SCOOP powder Take by mouth 4 (four) times  daily as needed.     . psyllium (METAMUCIL) 58.6 % packet Take 1 packet by mouth daily.    . Sodium Fluoride (PREVIDENT 5000 BOOSTER PLUS) 1.1 % PSTE Place onto teeth daily.    . TURMERIC CURCUMIN PO Take by mouth daily.    Marland Kitchen UNABLE TO FIND CBD oil for pain, as needed.  Hemp flower extract for inflammation, as needed.     No current facility-administered medications on file prior to visit.     Allergies  Allergen Reactions  . Protonix [Pantoprazole Sodium]     Pt stated, "Blood pin-point rash"  . Tape Other (See Comments)    Adhesive Tape; pt stated "itch and redness" Adhesive Tape; pt stated "itch and redness"  . Pantoprazole Rash   Current Medications, Allergies, Past Medical History, Past Surgical History, Family History and Social History were reviewed in [employer] record.   Review of Systems:   Constitutional: Negative for fever, sweats, chills or weight loss.  Respiratory: Negative for shortness of breath.   Cardiovascular: Negative for chest pain, palpitations and leg swelling.  Gastrointestinal: See HPI.  Musculoskeletal: Negative for back pain or muscle aches.  Neurological: Negative for dizziness, headaches or paresthesias.    Physical Exam: BP 122/78   Pulse 79   Ht 5\' 3"  (1.6 m)   Wt 157 lb (71.2 kg)   SpO2 97%   BMI 27.81 kg/m  General: Well developed 85 year old female in no acute distress. Head: Normocephalic and atraumatic. Eyes: No scleral icterus. Conjunctiva pink . Ears: Normal auditory acuity. Mouth: Dentition intact. No ulcers or lesions.  Lungs: Clear throughout to auscultation. Heart: Regular rate and rhythm, no murmur. Abdomen: Soft, nontender and nondistended. No masses or hepatomegaly. Normal bowel sounds x 4 quadrants.  Rectal: Deferred.  Musculoskeletal: Symmetrical with no gross deformities. Extremities: No edema. Neurological: Alert oriented x 4. No focal deficits.  Psychological: Alert and cooperative. Normal  mood and affect  Assessment and Recommendations:  39. 85 year old female with a history of sigmoid diverticulitis with recurrent LLQ abdominal pain. CTAP 08/14/2020 showed persistent or recurrent colonic Formisano thickening and pericolonic edema of the mid sigmoid colon in the region of multiple colonic diverticula suspicious for diverticulitis. LLQ pain resolved after taking Augmentin 875mg  po bid x 10 days. Post diverticulitis colonoscopy recommended to rule out underlying colon malignancy. Positive family history of colon cancer.  -Colonoscopy to be scheduled no earlier than 6 weeks from diverticulitis onset/treatment  -Colonoscopy benefits and risks discussed including risk with sedation, risk of bleeding, perforation and infection  -Miralax as needed -Florastor, optional  -Patient to call our office if lower abdominal  pain recurs  2. GERD, possible laryngeal reflux  -Increase Pantoprazole 20mg  po bid. Monitor for rash/reaction. Patient will call me in 2 weeks with update. -EGD to be done at the time of her colonoscopy -GERD diet discussed

## 2020-09-15 NOTE — Progress Notes (Signed)
Attending Physician's Attestation   I have reviewed the chart.   I agree with the Advanced Practitioner's note, impression, and recommendations with any updates as below.    Jazlen Ogarro Mansouraty, MD [facility] Advanced Endoscopy Office # [phone]  

## 2020-09-18 ENCOUNTER — Other Ambulatory Visit: Payer: Self-pay | Admitting: Internal Medicine

## 2020-09-18 DIAGNOSIS — Z1231 Encounter for screening mammogram for malignant neoplasm of breast: Secondary | ICD-10-CM

## 2020-09-29 ENCOUNTER — Encounter: Payer: Self-pay | Admitting: Gastroenterology

## 2020-09-29 ENCOUNTER — Ambulatory Visit (AMBULATORY_SURGERY_CENTER): Payer: Medicare Other | Admitting: Gastroenterology

## 2020-09-29 ENCOUNTER — Telehealth: Payer: Self-pay | Admitting: Internal Medicine

## 2020-09-29 ENCOUNTER — Other Ambulatory Visit: Payer: Self-pay

## 2020-09-29 VITALS — BP 146/74 | HR 71 | Temp 96.8°F | Resp 15 | Ht 63.0 in | Wt 157.0 lb

## 2020-09-29 DIAGNOSIS — K319 Disease of stomach and duodenum, unspecified: Secondary | ICD-10-CM | POA: Diagnosis not present

## 2020-09-29 DIAGNOSIS — K5669 Other partial intestinal obstruction: Secondary | ICD-10-CM

## 2020-09-29 DIAGNOSIS — K449 Diaphragmatic hernia without obstruction or gangrene: Secondary | ICD-10-CM

## 2020-09-29 DIAGNOSIS — K641 Second degree hemorrhoids: Secondary | ICD-10-CM | POA: Diagnosis not present

## 2020-09-29 DIAGNOSIS — K2970 Gastritis, unspecified, without bleeding: Secondary | ICD-10-CM | POA: Diagnosis not present

## 2020-09-29 DIAGNOSIS — K5732 Diverticulitis of large intestine without perforation or abscess without bleeding: Secondary | ICD-10-CM

## 2020-09-29 DIAGNOSIS — Z86010 Personal history of colonic polyps: Secondary | ICD-10-CM

## 2020-09-29 DIAGNOSIS — K219 Gastro-esophageal reflux disease without esophagitis: Secondary | ICD-10-CM

## 2020-09-29 DIAGNOSIS — K3189 Other diseases of stomach and duodenum: Secondary | ICD-10-CM

## 2020-09-29 MED ORDER — SODIUM CHLORIDE 0.9 % IV SOLN: 500.0000 mL | Freq: Once | INTRAVENOUS | Status: DC

## 2020-09-29 NOTE — Progress Notes (Signed)
To PACU, VSS. Report to [email or web address] 

## 2020-09-29 NOTE — Patient Instructions (Signed)
Please read handouts provided. Continue present medications. Await pathology results. High Fiber Diet.     YOU HAD AN ENDOSCOPIC PROCEDURE TODAY AT THE [facility]:   Refer to the procedure report that was given to you for any specific questions about what was found during the examination.  If the procedure report does not answer your questions, please call your gastroenterologist to clarify.  If you requested that your care partner not be given the details of your procedure findings, then the procedure report has been included in a sealed envelope for you to review at your convenience later.  YOU SHOULD EXPECT: Some feelings of bloating in the abdomen. Passage of more gas than usual.  Walking can help get rid of the air that was put into your GI tract during the procedure and reduce the bloating. If you had a lower endoscopy (such as a colonoscopy or flexible sigmoidoscopy) you may notice spotting of blood in your stool or on the toilet paper. If you underwent a bowel prep for your procedure, you may not have a normal bowel movement for a few days.  Please Note:  You might notice some irritation and congestion in your nose or some drainage.  This is from the oxygen used during your procedure.  There is no need for concern and it should clear up in a day or so.  SYMPTOMS TO REPORT IMMEDIATELY:   Following lower endoscopy (colonoscopy or flexible sigmoidoscopy):  Excessive amounts of blood in the stool  Significant tenderness or worsening of abdominal pains  Swelling of the abdomen that is new, acute  Fever of 100F or higher   Following upper endoscopy (EGD)  Vomiting of blood or coffee ground material  New chest pain or pain under the shoulder blades  Painful or persistently difficult swallowing  New shortness of breath  Fever of 100F or higher  Black, tarry-looking stools  For urgent or emergent issues, a gastroenterologist can be reached at any hour by calling [phone]  [phone]. Do not use MyChart messaging for urgent concerns.    DIET:  We do recommend a small meal at first, but then you may proceed to your regular diet.  Drink plenty of fluids but you should avoid alcoholic beverages for 24 hours.  ACTIVITY:  You should plan to take it easy for the rest of today and you should NOT DRIVE or use heavy machinery until tomorrow (because of the sedation medicines used during the test).    FOLLOW UP: Our staff will call the number listed on your records 48-72 hours following your procedure to check on you and address any questions or concerns that you may have regarding the information given to you following your procedure. If we do not reach you, we will leave a message.  We will attempt to reach you two times.  During this call, we will ask if you have developed any symptoms of COVID 19. If you develop any symptoms (ie: fever, flu-like symptoms, shortness of breath, cough etc.) before then, please call [phone].  If you test positive for Covid 19 in the 2 weeks post procedure, please call and report this information to us.    If any biopsies were taken you will be contacted by phone or by letter within the next 1-3 weeks.  Please call us at [phone] if you have not heard about the biopsies in 3 weeks.    SIGNATURES/CONFIDENTIALITY: You and/or your care partner have signed paperwork which will be entered into   your electronic medical record.  These signatures attest to the fact that that the information above on your After Visit Summary has been reviewed and is understood.  Full responsibility of the confidentiality of this discharge information lies with you and/or your care-partner. 

## 2020-09-29 NOTE — Telephone Encounter (Signed)
Pt had procedures today with GM, EGD and colon.  She is feeling well.  No fever.  No abd pain.  No nausea/vomiting. Rice cereal, bread and a coffee-substitute called Cafix which contains barley, rye and sugar beet. She has had 2 very loose small volume stool which were brown but contain small pieces or flecks of black material.  She has not observed this color before and was just making [place] aware.  No red blood.  Stool was again thin and not thick.  Procedures reviewed.  The only intervention was stomach/gastric biopsy.  Very unlikely to represent a GI bleed and more likely related to the coffee substitute that she consumed after her procedures today.  Reassurance provided, I told her I would let Dr. [place] know and that we could check on her tomorrow.  Time provided for questions and answers and she thanked me for the call

## 2020-09-29 NOTE — Progress Notes (Signed)
VS taken by Caren Griffins, RN

## 2020-09-29 NOTE — Op Note (Addendum)
[facility] Patient Name: Hailey HughsMarilyn Perkins Procedure Date: 09/29/2020 9:52 AM MRN: [id number] Endoscopist: Corliss ParishGabriel Mansouraty , MD Age: [age] Referring MD:  Date of Birth: 05/30/1935 Gender: Female Account #: [id number] Procedure:                Upper GI endoscopy Indications:              Heartburn, Suspected esophageal reflux, Chronic                            cough Medicines:                Monitored Anesthesia Care Procedure:                Pre-Anesthesia Assessment:                           - Prior to the procedure, a History and Physical                            was performed, and patient medications and                            allergies were reviewed. The patient's tolerance of                            previous anesthesia was also reviewed. The risks                            and benefits of the procedure and the sedation                            options and risks were discussed with the patient.                            All questions were answered, and informed consent                            was obtained. Prior Anticoagulants: The patient has                            taken no previous anticoagulant or antiplatelet                            agents. ASA Grade Assessment: II - A patient with                            mild systemic disease. After reviewing the risks                            and benefits, the patient was deemed in                            satisfactory condition to undergo the procedure.  After obtaining informed consent, the endoscope was                            passed under direct vision. Throughout the                            procedure, the patient's blood pressure, pulse, and                            oxygen saturations were monitored continuously. The                            Endoscope was introduced through the mouth, and                            advanced to the second part of duodenum. The  upper                            GI endoscopy was accomplished without difficulty.                            The patient tolerated the procedure. The Endoscope                            was introduced through the and advanced to the. Scope In: Scope Out: Findings:                 No gross lesions were noted in the entire                            esophagus. Biopsies were taken with a cold forceps                            for histology to rule out EoE/LoE.                           A 3 cm hiatal hernia was found. The proximal extent                            of the gastric folds (end of tubular esophagus) was                            35 cm from the incisors. The hiatal narrowing was                            38 cm from the incisors. The Z-line was 35 cm from                            the incisors.                           Patchy moderate inflammation characterized by  erosions, erythema and friability was found in the                            gastric body and in the gastric antrum.                           No gross lesions were noted in the entire examined                            stomach. Biopsies were taken with a cold forceps                            for histology and Helicobacter pylori testing.                           No gross lesions were noted in the duodenal bulb,                            in the first portion of the duodenum and in the                            second portion of the duodenum. Complications:            No immediate complications. Estimated Blood Loss:     Estimated blood loss was minimal. Impression:               - No gross lesions in esophagus. Biopsied.                           - 3 cm hiatal hernia.                           - Gastritis. No other gross lesions in the stomach.                            Biopsied.                           - No gross lesions in the duodenal bulb, in the                             first portion of the duodenum and in the second                            portion of the duodenum. Recommendation:           - Proceed to scheduled colonoscopy.                           - Await pathology results.                           - Continue current PPI dosing for now until  biopsies return. May consider 1 more month of twice                            daily and then decreasing to once daily depending                            on things and final pathology.                           - Observe patient's clinical course.                           - The findings and recommendations were discussed                            with the patient.                           - The findings and recommendations were discussed                            with the patient's family. Corliss Parish, MD 09/29/2020 11:13:24 AM

## 2020-09-29 NOTE — Progress Notes (Signed)
Called to room to assist during endoscopic procedure.  Patient ID and intended procedure confirmed with present staff. Received instructions for my participation in the procedure from the performing physician.  

## 2020-09-29 NOTE — Op Note (Addendum)
[facility] Patient Name: Hailey Perkins Procedure Date: 09/29/2020 9:51 AM MRN: [id number] Endoscopist: Justice Britain , MD Age: 85 Referring MD:  Date of Birth: 03/13/1935 Gender: Female Account #: [id number] Procedure:                Colonoscopy Indications:              Abnormal CT of the GI tract, Diverticula, Follow-up                            of diverticulitis Medicines:                Monitored Anesthesia Care Procedure:                Pre-Anesthesia Assessment:                           - Prior to the procedure, a History and Physical                            was performed, and patient medications and                            allergies were reviewed. The patient's tolerance of                            previous anesthesia was also reviewed. The risks                            and benefits of the procedure and the sedation                            options and risks were discussed with the patient.                            All questions were answered, and informed consent                            was obtained. Prior Anticoagulants: The patient has                            taken no previous anticoagulant or antiplatelet                            agents. ASA Grade Assessment: III - A patient with                            severe systemic disease. After reviewing the risks                            and benefits, the patient was deemed in                            satisfactory condition to undergo the procedure.  After obtaining informed consent, the colonoscope                            was passed under direct vision. Throughout the                            procedure, the patient's blood pressure, pulse, and                            oxygen saturations were monitored continuously. The                            Colonoscope was introduced through the anus with                            the intention of advancing to the  cecum. The scope                            was advanced to the ascending colon before the                            procedure was aborted. Medications were given. The                            colonoscopy was technically difficult and complex                            due to bowel stenosis, restricted mobility of the                            colon and a tortuous colon. Successful completion                            of the procedure was aided by changing the                            patient's position, using manual pressure,                            withdrawing and reinserting the scope, withdrawing                            the scope and replacing with the adult endoscope,                            straightening and shortening the scope to obtain                            bowel loop reduction and using scope torsion. The                            patient tolerated the procedure. Scope In: 10:19:48 AM Scope Out: 11:00:36 AM Total Procedure Duration: 0 hours 40 minutes 48  seconds  Findings:                 The digital rectal exam findings include                            hemorrhoids. Pertinent negatives include no                            palpable rectal lesions.                           A benign-appearing, intrinsic severe                            stenosis/narrowing measuring 10 cm (in length) x 1                            cm (inner diameter) was found in the recto-sigmoid                            colon and in the sigmoid colon and was traversed.                            This region was only traversed when transitioned                            back to an EGD scope.                           Diverticulosis in the left colon.                           Unable to intubate cecum due to the endoscope                            length unfortunately even while trying to                            straighten and tighten with a biopsy forceps.                            Normal mucosa was found in the entire visualized                            colon othewise. Biopsies for histology were taken                            with a cold forceps from the entire colon for                            evaluation of microscopic colitis.                           Non-bleeding non-thrombosed external and internal  hemorrhoids were found during retroflexion, during                            perianal exam and during digital exam. The                            hemorrhoids were Grade II (internal hemorrhoids                            that prolapse but reduce spontaneously). Complications:            No immediate complications. Estimated Blood Loss:     Estimated blood loss was minimal. Impression:               - Hemorrhoids found on digital rectal exam.                           - Diverticulosis in in the left colon.                           - Narrowing as a result of a fixed colon in the                            recto-sigmoid colon and in the sigmoid colon - only                            traversed with the adult endoscope.                           - Cecum not intubated due to length of endoscope.                           - Normal mucosa in the entire examined colon.                            Biopsied.                           - Non-bleeding non-thrombosed external and internal                            hemorrhoids. Recommendation:           - The patient will be observed post-procedure,                            until all discharge criteria are met.                           - Discharge patient to home.                           - Patient has a contact number available for                            emergencies. The signs and symptoms of potential  delayed complications were discussed with the                            patient. Return to normal activities tomorrow.                            Written  discharge instructions were provided to the                            patient.                           - High fiber diet.                           - Continue present medications.                           - Await pathology results.                           - Will discuss with patient as an outpatient                            whether we consider repeat attempt at Colonoscopy                            with Ultraslim colonoscope vs CT-Colonography vs                            FIT vs Surveillance monitoring since we could not                            evaluate the cecum for any lesions. CT scan had not                            shown anything previously, but not a Colonography.                           - The findings and recommendations were discussed                            with the patient.                           - The findings and recommendations were discussed                            with the patient's family. Justice Britain, MD 09/29/2020 11:21:02 AM

## 2020-09-30 NOTE — Telephone Encounter (Signed)
I called the pt and spoke with her this morning and she tells me that she has had no further flecks in her stool. She is aware to call if she has any further concerns

## 2020-10-01 ENCOUNTER — Telehealth: Payer: Self-pay

## 2020-10-01 ENCOUNTER — Other Ambulatory Visit (INDEPENDENT_AMBULATORY_CARE_PROVIDER_SITE_OTHER): Payer: Medicare Other

## 2020-10-01 ENCOUNTER — Telehealth: Payer: Self-pay | Admitting: Gastroenterology

## 2020-10-01 DIAGNOSIS — K921 Melena: Secondary | ICD-10-CM

## 2020-10-01 DIAGNOSIS — D509 Iron deficiency anemia, unspecified: Secondary | ICD-10-CM

## 2020-10-01 DIAGNOSIS — R194 Change in bowel habit: Secondary | ICD-10-CM | POA: Diagnosis not present

## 2020-10-01 LAB — CBC
HCT: 34.8 % — ABNORMAL LOW (ref 36.0–46.0)
Hemoglobin: 11.5 g/dL — ABNORMAL LOW (ref 12.0–15.0)
MCHC: 33.1 g/dL (ref 30.0–36.0)
MCV: 88.6 fl (ref 78.0–100.0)
Platelets: 257 10*3/uL (ref 150.0–400.0)
RBC: 3.93 Mil/uL (ref 3.87–5.11)
RDW: 14.3 % (ref 11.5–15.5)
WBC: 10.1 10*3/uL (ref 4.0–10.5)

## 2020-10-01 LAB — BASIC METABOLIC PANEL
BUN: 25 mg/dL — ABNORMAL HIGH (ref 6–23)
CO2: 26 mEq/L (ref 19–32)
Calcium: 8.9 mg/dL (ref 8.4–10.5)
Chloride: 105 mEq/L (ref 96–112)
Creatinine, Ser: 0.73 mg/dL (ref 0.40–1.20)
GFR: 76.13 mL/min (ref >60.00)
Glucose, Bld: 98 mg/dL (ref 70–99)
Potassium: 4.1 mEq/L (ref 3.5–5.1)
Sodium: 138 mEq/L (ref 135–145)

## 2020-10-01 NOTE — Telephone Encounter (Signed)
Called and spoke with patient this AM. She had done well and was feeling and getting ready for trip to [place] later today. She however had a currant red/marroon bowel movement just earlier today. She has no other symptoms of presyncope or abdominal pain or nausea or vomiting. I think close observation is necessary. We will get STAT CBC/BMP done later this morning or early afternoon. We will see labs. Determining whether patient will need to come into the Ed/hospital for observation will be based on how the labs look as well as if she continues to have what sounds like possible lower GI bleeding. Ddx would be diverticular hemorrhage (she has never had), unlikely to be biopsy driven bleeding from the colon as only intervention, vs possible inflammation and sloughing from the tight stricture that was traversed from the sigmoid colon.  Hemorrhoidal bleeding seems less likely as well. She likely will need to hold on trip out of state today until we see how things go overall.  Please place labs STAT. Thanks.  GM

## 2020-10-01 NOTE — Telephone Encounter (Signed)
The pt has been advised that the My Chart message has been sent to Dr Meridee Score

## 2020-10-01 NOTE — Addendum Note (Signed)
Addended by: Corliss Parish on: 10/01/2020 05:44 PM   Modules accepted: Orders

## 2020-10-01 NOTE — Telephone Encounter (Signed)
Pt called back this morning complaining of loose stools and bleeding per rectum. Pt is concerned, says that there was a fair amount of blood when she wiped and some in the commode. States the color of the blood was between bright red and maroon. The patient states that she has no other symptoms.

## 2020-10-01 NOTE — Telephone Encounter (Signed)
Ro do I need to do anything?

## 2020-10-01 NOTE — Telephone Encounter (Signed)
Nope, taken care of.

## 2020-10-01 NOTE — Telephone Encounter (Signed)
  Follow up Call-  Call back number 09/29/2020  Post procedure Call Back phone  # [phone]  Permission to leave phone message Yes  Some recent data might be hidden     Patient questions:  Do you have a fever, pain , or abdominal swelling? No. Pain Score  0 *  Have you tolerated food without any problems? Yes.    Have you been able to return to your normal activities? Yes.    Do you have any questions about your discharge instructions: Diet   No. Medications  No. Follow up visit  No.  Do you have questions or concerns about your Care? No.  Actions: * If pain score is 4 or above: No action needed, pain <4.   1. Have you developed a fever since your procedure? No   2.   Have you had an respiratory symptoms (SOB or cough) since your procedure? No   3.   Have you tested positive for COVID 19 since your procedure? No   4.   Have you had any family members/close contacts diagnosed with the COVID 19 since your procedure?  No    If yes to any of these questions please route to Laverna Peace, RN and Karlton Lemon, RN

## 2020-10-01 NOTE — Telephone Encounter (Signed)
Stat order for CBC and BMP entered in Epic. Pt will be here in the next 1/2 hour per Dr. Meridee Score.

## 2020-10-01 NOTE — Telephone Encounter (Signed)
Thanks for update. GM 

## 2020-10-01 NOTE — Telephone Encounter (Signed)
I just saw this. I's called and spoke with the patient around 530. This was a bowel movement from around 130 this afternoon.  She thinks it is similar if not slightly less than what she had before. She continues to be asymptomatic and is not feeling dizzy or lightheaded or having any abdominal pain or other symptoms. At this point, I am becoming more suspicious that this is a diverticular bleed versus any sort of ischemia and congestion leading to these symptoms. Hemorrhoidal certainly could still be a playing a role but we cannot know without potentially relook in. I do not know that we will need to do that especially as she remains asymptomatic but if these episodes continue to occur over the course of today into tomorrow we may not have a choice but to bring the patient in for observation after being evaluated in the emergency department and likely coming in for observation. Certainly we want to do what is right for her and be thoughtful. I have asked her to continue to monitor her symptoms and hopefully things will continue to slow down. If multiple episodes occur overnight or she is having any symptoms suggestive of a more brisk bleed she will call our coverage overnight to update [place]. She understands that she may be recommended to go to the hospital but certainly has the decision tree of what she wants to do overall. I am going to place a stat CBC and BMP ordered for tomorrow morning to hopefully get a trend to see how things are going. If she is having a significant progressive anemia, then she may end up having to go to the hospital thereafter. The patient will be reached to by my team tomorrow morning. Patty, please reach out to the patient tomorrow morning.  Thanks. GM

## 2020-10-02 ENCOUNTER — Other Ambulatory Visit (INDEPENDENT_AMBULATORY_CARE_PROVIDER_SITE_OTHER): Payer: Medicare Other

## 2020-10-02 ENCOUNTER — Telehealth: Payer: Self-pay

## 2020-10-02 DIAGNOSIS — K921 Melena: Secondary | ICD-10-CM

## 2020-10-02 LAB — CBC
HCT: 31.1 % — ABNORMAL LOW (ref 36.0–46.0)
Hemoglobin: 10.3 g/dL — ABNORMAL LOW (ref 12.0–15.0)
MCHC: 33.2 g/dL (ref 30.0–36.0)
MCV: 89.6 fl (ref 78.0–100.0)
Platelets: 253 10*3/uL (ref 150.0–400.0)
RBC: 3.48 Mil/uL — ABNORMAL LOW (ref 3.87–5.11)
RDW: 14.4 % (ref 11.5–15.5)
WBC: 8.5 10*3/uL (ref 4.0–10.5)

## 2020-10-02 NOTE — Telephone Encounter (Signed)
I just saw this. I's called and spoke with the patient around 530. This was a bowel movement from around 130 this afternoon.  She thinks it is similar if not slightly less than what she had before. She continues to be asymptomatic and is not feeling dizzy or lightheaded or having any abdominal pain or other symptoms. At this point, I am becoming more suspicious that this is a diverticular bleed versus any sort of ischemia and congestion leading to these symptoms. Hemorrhoidal certainly could still be a playing a role but we cannot know without potentially relook in. I do not know that we will need to do that especially as she remains asymptomatic but if these episodes continue to occur over the course of today into tomorrow we may not have a choice but to bring the patient in for observation after being evaluated in the emergency department and likely coming in for observation. Certainly we want to do what is right for her and be thoughtful. I have asked her to continue to monitor her symptoms and hopefully things will continue to slow down. If multiple episodes occur overnight or she is having any symptoms suggestive of a more brisk bleed she will call our coverage overnight to update us. She understands that she may be recommended to go to the hospital but certainly has the decision tree of what she wants to do overall. I am going to place a stat CBC and BMP ordered for tomorrow morning to hopefully get a trend to see how things are going. If she is having a significant progressive anemia, then she may end up having to go to the hospital thereafter. The patient will be reached to by my team tomorrow morning. Netta Fodge, please reach out to the patient tomorrow morning.  Thanks. GM 

## 2020-10-02 NOTE — Telephone Encounter (Signed)
Called and spoke with patient. She has not had another bowel movment since 0800. She went almost 16 hours between episodes from yesterday at 130 until the 0800 episode. The patient is hemodynamically stable. No presyncope signs/symptoms. She feels well. She has not had another bowel movement. Hopefully this was a self-sustained diverticular bleed that has been able to be observed. She understands that over weekend we are unable to do watchful waiting/expectant waiting for things to be monitored. Hopefully things will subside the rest of today into the weekend. If she continues to have bloody bowel movements she will need to reach out to our covering team and discuss situation with them. She needs to be prepared to come to the hospital as being recommendation. Not clear and hopefully will not need repeat endoscopic evaluation but the amount of bleeding does not seem consistent with something that would be found on a tagged RBC or CT-Angiography test. I will update On[facility] team this weekend. Patty, call patient on Monday, and check on her and get set up for repeat CBC and BMP early next week, with hope that she does well in coming days. Thank you.  GM

## 2020-10-04 NOTE — Progress Notes (Signed)
Separate MyChart message and update from that day. GM

## 2020-10-04 NOTE — Progress Notes (Signed)
Separate MyChart message and update from that day. GM 

## 2020-10-05 ENCOUNTER — Telehealth: Payer: Self-pay | Admitting: Gastroenterology

## 2020-10-05 NOTE — Telephone Encounter (Signed)
Message sent to Dr Meridee Score thru My Chart pt message received with pictures.

## 2020-10-06 ENCOUNTER — Other Ambulatory Visit: Payer: Self-pay

## 2020-10-06 DIAGNOSIS — K921 Melena: Secondary | ICD-10-CM

## 2020-10-06 NOTE — Telephone Encounter (Signed)
Looks like bowel movements have improved based on the images that were shown. Blood no longer present on the last 1. Hopefully this was a self-sufficient diverticular bleed versus inflammation driven bleeding from the narrowing having band accessed. In either case we will recheck her blood count and iron indices later this week (CBC/iron/TIBC/ferritin). We will see how she does. We will also get her set up for follow-up with NP [facility] or myself in the next 6 weeks. We will need to discuss whether we are going to consider a repeat colonoscopy with ultraslim colonoscope the hospital-based setting versus CT colonography versus Cologuard versus monitoring.  Corliss Parish, MD [facility] Office # [phone]

## 2020-10-07 ENCOUNTER — Encounter: Payer: Self-pay | Admitting: Gastroenterology

## 2020-10-08 ENCOUNTER — Encounter: Payer: Self-pay | Admitting: Family Medicine

## 2020-10-08 ENCOUNTER — Other Ambulatory Visit (INDEPENDENT_AMBULATORY_CARE_PROVIDER_SITE_OTHER): Payer: Medicare Other

## 2020-10-08 DIAGNOSIS — K921 Melena: Secondary | ICD-10-CM | POA: Diagnosis not present

## 2020-10-08 LAB — CBC WITH DIFFERENTIAL/PLATELET
Basophils Absolute: 0 10*3/uL (ref 0.0–0.1)
Basophils Relative: 0.4 % (ref 0.0–3.0)
Eosinophils Absolute: 0.3 10*3/uL (ref 0.0–0.7)
Eosinophils Relative: 4.6 % (ref 0.0–5.0)
HCT: 32.5 % — ABNORMAL LOW (ref 36.0–46.0)
Hemoglobin: 10.9 g/dL — ABNORMAL LOW (ref 12.0–15.0)
Lymphocytes Relative: 25.4 % (ref 12.0–46.0)
Lymphs Abs: 1.6 10*3/uL (ref 0.7–4.0)
MCHC: 33.4 g/dL (ref 30.0–36.0)
MCV: 89.7 fl (ref 78.0–100.0)
Monocytes Absolute: 0.5 10*3/uL (ref 0.1–1.0)
Monocytes Relative: 7.6 % (ref 3.0–12.0)
Neutro Abs: 3.8 10*3/uL (ref 1.4–7.7)
Neutrophils Relative %: 62 % (ref 43.0–77.0)
Platelets: 254 10*3/uL (ref 150.0–400.0)
RBC: 3.62 Mil/uL — ABNORMAL LOW (ref 3.87–5.11)
RDW: 14.7 % (ref 11.5–15.5)
WBC: 6.2 10*3/uL (ref 4.0–10.5)

## 2020-10-08 LAB — IBC + FERRITIN
Ferritin: 84.2 ng/mL (ref 10.0–291.0)
Iron: 51 ug/dL (ref 42–145)
Saturation Ratios: 18.7 % — ABNORMAL LOW (ref 20.0–50.0)
Transferrin: 195 mg/dL — ABNORMAL LOW (ref 212.0–360.0)

## 2020-10-09 ENCOUNTER — Telehealth: Payer: Self-pay | Admitting: Gastroenterology

## 2020-10-09 ENCOUNTER — Ambulatory Visit: Payer: Medicare Other | Admitting: Gastroenterology

## 2020-10-09 ENCOUNTER — Ambulatory Visit
Admission: RE | Admit: 2020-10-09 | Discharge: 2020-10-09 | Disposition: A | Discharge status: Home / Self Care | Payer: Medicare Other | Source: Ambulatory Visit | Attending: Internal Medicine | Admitting: Internal Medicine

## 2020-10-09 ENCOUNTER — Other Ambulatory Visit: Payer: Self-pay

## 2020-10-09 DIAGNOSIS — Z1231 Encounter for screening mammogram for malignant neoplasm of breast: Secondary | ICD-10-CM

## 2020-10-09 DIAGNOSIS — D509 Iron deficiency anemia, unspecified: Secondary | ICD-10-CM

## 2020-10-09 NOTE — Telephone Encounter (Signed)
See alternate note  

## 2020-10-09 NOTE — Telephone Encounter (Signed)
Patient is returning your call.  

## 2020-10-13 ENCOUNTER — Ambulatory Visit: Payer: Medicare Other | Admitting: Family Medicine

## 2020-10-13 ENCOUNTER — Encounter: Payer: Self-pay | Admitting: Family Medicine

## 2020-10-13 VITALS — BP 128/80 | HR 82 | Ht 63.0 in | Wt 157.0 lb

## 2020-10-13 DIAGNOSIS — G4733 Obstructive sleep apnea (adult) (pediatric): Secondary | ICD-10-CM | POA: Diagnosis not present

## 2020-10-13 DIAGNOSIS — Z9989 Dependence on other enabling machines and devices: Secondary | ICD-10-CM | POA: Diagnosis not present

## 2020-10-13 DIAGNOSIS — Z1231 Encounter for screening mammogram for malignant neoplasm of breast: Secondary | ICD-10-CM

## 2020-10-13 NOTE — Patient Instructions (Addendum)
Please continue using your CPAP regularly. While your insurance requires that you use CPAP at least 4 hours each night on 70% of the nights, I recommend, that you not skip any nights and use it throughout the night if you can. Getting used to CPAP and staying with the treatment long term does take time and patience and discipline. Untreated obstructive sleep apnea when it is moderate to severe can have an adverse impact on cardiovascular health and raise her risk for heart disease, arrhythmias, hypertension, congestive heart failure, stroke and diabetes. Untreated obstructive sleep apnea causes sleep disruption, nonrestorative sleep, and sleep deprivation. This can have an impact on your day to day functioning and cause daytime sleepiness and impairment of cognitive function, memory loss, mood disturbance, and problems focussing. Using CPAP regularly can improve these symptoms.  May consider melatonin 3mg  daily for concerns of insomnia. You can get this at any pharmacy.   Folllow up in 6 months   CPAP and BPAP Information CPAP and BPAP are methods of helping a person breathe with the use of air pressure. CPAP stands for "continuous positive airway pressure." BPAP stands for "bi-level positive airway pressure." In both methods, air is blown through your nose or mouth and into your air passages to help you breathe well. CPAP and BPAP use different amounts of pressure to blow air. With CPAP, the amount of pressure stays the same while you breathe in and out. With BPAP, the amount of pressure is increased when you breathe in (inhale) so that you can take larger breaths. Your health care provider will recommend whether CPAP or BPAP would be more helpful for you. Why are CPAP and BPAP treatments used? CPAP or BPAP can be helpful if you have:  Sleep apnea.  Chronic obstructive pulmonary disease (COPD).  Heart failure.  Medical conditions that weaken the muscles of the chest including muscular dystrophy,  or neurological diseases such as amyotrophic lateral sclerosis (ALS).  Other problems that cause breathing to be weak, abnormal, or difficult. CPAP is most commonly used for obstructive sleep apnea (OSA) to keep the airways from collapsing when the muscles relax during sleep. How is CPAP or BPAP administered? Both CPAP and BPAP are provided by a small machine with a flexible plastic tube that attaches to a plastic mask. You wear the mask. Air is blown through the mask into your nose or mouth. The amount of pressure that is used to blow the air can be adjusted on the machine. Your health care provider will determine the pressure setting that should be used based on your individual needs. When should CPAP or BPAP be used? In most cases, the mask only needs to be worn during sleep. Generally, the mask needs to be worn throughout the night and during any daytime naps. People with certain medical conditions may also need to wear the mask at other times when they are awake. Follow instructions from your health care provider about when to use the machine. What are some tips for using the mask?   Because the mask needs to be snug, some people feel trapped or closed-in (claustrophobic) when first using the mask. If you feel this way, you may need to get used to the mask. One way to do this is by holding the mask loosely over your nose or mouth and then gradually applying the mask more snugly. You can also gradually increase the amount of time that you use the mask.  Masks are available in various types and sizes.  Some fit over your mouth and nose while others fit over just your nose. If your mask does not fit well, talk with your health care provider about getting a different one.  If you are using a mask that fits over your nose and you tend to breathe through your mouth, a chin strap may be applied to help keep your mouth closed.  The CPAP and BPAP machines have alarms that may sound if the mask comes off  or develops a leak.  If you have trouble with the mask, it is very important that you talk with your health care provider about finding a way to make the mask easier to tolerate. Do not stop using the mask. Stopping the use of the mask could have a negative impact on your health. What are some tips for using the machine?  Place your CPAP or BPAP machine on a secure table or stand near an electrical outlet.  Know where the on/off switch is located on the machine.  Follow instructions from your health care provider about how to set the pressure on your machine and when you should use it.  Do not eat or drink while the CPAP or BPAP machine is on. Food or fluids could get pushed into your lungs by the pressure of the CPAP or BPAP.  Do not smoke. Tobacco smoke residue can damage the machine.  For home use, CPAP and BPAP machines can be rented or purchased through home health care companies. Many different brands of machines are available. Renting a machine before purchasing may help you find out which particular machine works well for you.  Keep the CPAP or BPAP machine and attachments clean. Ask your health care provider for specific instructions. Get help right away if:  You have redness or open areas around your nose or mouth where the mask fits.  You have trouble using the CPAP or BPAP machine.  You cannot tolerate wearing the CPAP or BPAP mask.  You have pain, discomfort, and bloating in your abdomen. Summary  CPAP and BPAP are methods of helping a person breathe with the use of air pressure.  Both CPAP and BPAP are provided by a small machine with a flexible plastic tube that attaches to a plastic mask.  If you have trouble with the mask, it is very important that you talk with your health care provider about finding a way to make the mask easier to tolerate. This information is not intended to replace advice given to you by your health care provider. Make sure you discuss any  questions you have with your health care provider. Document Revised: 03/13/2019 Document Reviewed: 10/10/2016 Elsevier Patient Education  2020 [employer].

## 2020-10-13 NOTE — Progress Notes (Signed)
PATIENT: Hailey Perkins DOB: 1935/10/01  REASON FOR VISIT: follow up HISTORY FROM: patient  Chief Complaint  Patient presents with   Follow-up   Sleep Apnea     HISTORY OF PRESENT ILLNESS: Today 10/13/20 Hailey Perkins is a 85 y.o. female here today for follow up for OSA on CPAP.  She reports that she is doing better on CPAP therapy.  She was seen by her dentist who prescribed an oral appliance.  She reports that she has been using both oral appliance and her CPAP full facemask.  She denies any concerns of skin breakdown or sores in her mouth.  She feels that she is sleeping better using both the oral appliance and CPAP.  Compliance has improved.  She denies any concerns with her machine or supplies.  She does note worsening insomnia.  She feels that multiple factors are contributing.  She has tried melatonin in the past with success.  Compliance report dated 09/12/2020-10/11/2020 reveals she used CPAP 19 of the past 30 days for compliance of 63%.  She is CPAP greater than 4 hours 17 of the past 30 days for compliance of 57%.  Average usage on days used was 5 hours and 53 minutes.  Residual AHI was 2.2 on 5 to 13 cm of water pressure and an EPR of 2.  There was no significant leak noted.   HISTORY: (copied from my note on 03/26/2020)  Hailey Perkins is a 85 y.o. female here today for follow up for OSA on CPAP therapy.  She expresses concerns today with continued use of CPAP.  She is aggravated with having to use a chinstrap and feels that it is uncomfortable.  She is having to use a handkerchief as a barrier as the chinstrap causes itching of her face.  She does not feel that her mask is fitting well.  She feels that her headgear gets tangled her hair.  She states that it is more cumbersome to get ready go to bed when she uses CPAP therapy.  She has not noted any significant benefit when using CPAP.  Compliance report dated 02/24/2020 through 03/24/2020 reveals that she used CPAP for the  past 30 days for compliance of 13%.  She used CPAP greater than 4 hours to the past 30 days for compliance of 7%.  Average usage on days used was 4 hours and 39 minutes.  Residual AHI was 2.3 on 5 to 13 cm of water and an EPR of 2.  There was no significant leak noted.  HISTORY: (copied from my note on 09/26/2019)  Hailey Perkins a 85 y.o.femalehere today for follow up for OSA on CPAP. She reports using CPAP most nights since 08/29/2019. She is working with DME to find a mask that works better for her. She got a Airtouch (M) full face mask last week. She is unsure if she likes it. She did not like the nasal pillow. She felt that she was having more leaks with it. She is trying to avoid sleeping on her back. She is having difficulty adjusting.  Compliance report dated 08/27/2019 through 09/25/2019 reveals that she is using CPAP 29 out of the last 30 days for compliance of 97%. 25 days she used CPAP greater than 4 hours for compliance of 83%. Average usage was 6 hours and 49 minutes. AHI was 5 on 5 to 13 cm of water and an EPR of 2. There was a leak noted in the 95th percentile of 27.4.  Over the past 2 to 3 days leak has improved significantly. This correlates with usage of new full facemask.    REVIEW OF SYSTEMS: Out of a complete 14 system review of symptoms, the patient complains only of the following symptoms, insomnia and all other reviewed systems are negative.  ESS: 14 FSS 34  ALLERGIES: Allergies  Allergen Reactions   Protonix [Pantoprazole Sodium]     Pt stated, "Blood pin-point rash"   Tape Other (See Comments)    Adhesive Tape; pt stated "itch and redness" Adhesive Tape; pt stated "itch and redness"   Pantoprazole Rash    HOME MEDICATIONS: Outpatient Medications Prior to Visit  Medication Sig Dispense Refill   Ascorbic Acid (VITAMIN C) 1000 MG tablet Take 1,000 mg by mouth daily.     betamethasone dipropionate 0.05 % lotion APP ON THE SKIN D  0   Biotin 10  MG TABS Take by mouth.     Calcium Citrate-Vitamin D (CALCIUM + D PO) Take by mouth daily.     celecoxib (CELEBREX) 200 MG capsule Take 200 mg by mouth daily.     Cholecalciferol (VITAMIN D3) 2000 units TABS Take 1 tablet by mouth daily.     diclofenac Sodium (VOLTAREN) 1 % GEL      dorzolamide (TRUSOPT) 2 % ophthalmic solution 1 drop 2 (two) times daily.     DULoxetine (CYMBALTA) 30 MG capsule TAKE 1 CAPSULE(30 MG) BY MOUTH DAILY 90 capsule 2   Ginger, Zingiber officinalis, (GINGER ROOT PO) Take by mouth.     GINKGO BILOBA PO Take by mouth daily.     GLUCOSAMINE-CHONDROITIN-MSM PO Take by mouth. Take 2 oz. Daily.  Glucosamine 2,000 mg Chondroitin 1,200 mg, MSM 500 mg, Hyaluronic Acid 10 mg.     Magnesium 250 MG TABS Take 1 tablet by mouth daily.     metroNIDAZOLE (METROGEL) 1 % gel Apply 1 application topically as needed (rosacea).     Misc Natural Products (TART CHERRY ADVANCED PO) Take by mouth.     modafinil (PROVIGIL) 100 MG tablet Take 100 mg by mouth as needed.     Multiple Vitamins-Minerals (PRESERVISION AREDS PO) Take by mouth daily.     Omega-3 Fatty Acids (FISH OIL) 1000 MG CAPS Take 1 capsule by mouth.     pantoprazole (PROTONIX) 20 MG tablet Take 1 tablet (20 mg total) by mouth 2 (two) times daily. 60 tablet 2   Plant Sterols and Stanols (CHOLESTOFF PLUS PO) Take 1 capsule by mouth daily.     polyethylene glycol powder (GLYCOLAX/MIRALAX) 17 GM/SCOOP powder Take by mouth 4 (four) times daily as needed.      psyllium (METAMUCIL) 58.6 % packet Take 1 packet by mouth daily.     Sodium Fluoride (PREVIDENT 5000 BOOSTER PLUS) 1.1 % PSTE Place onto teeth daily.     TURMERIC CURCUMIN PO Take by mouth daily.     UNABLE TO FIND CBD oil for pain, as needed.  Hemp flower extract for inflammation, as needed.     No facility-administered medications prior to visit.    PAST MEDICAL HISTORY: Past Medical History:  Diagnosis Date   Arthritis    Cataract 2010    bilateral eyes   Chicken pox    GERD (gastroesophageal reflux disease)    Glaucoma    History of frequent urinary tract infections    Hyperlipidemia    Osteopenia    Peripheral neuropathy    Sleep apnea    wears a CPAP   Urine incontinence  PAST SURGICAL HISTORY: Past Surgical History:  Procedure Laterality Date   ABDOMINAL HYSTERECTOMY     APPENDECTOMY     BREAST BIOPSY Bilateral 10+ yrs ago   BENIGN   BUNIONECTOMY Left 1975   Left Toe   HAMMER TOE SURGERY Right 2005   Right foot correction hammertoe little toe   NEUROMA SURGERY Right 2003   Right Foot Neuroma, plus Bunionectomy, Right Great Toe   TONSILLECTOMY AND ADENOIDECTOMY      FAMILY HISTORY: Family History  Problem Relation Age of Onset   Hyperlipidemia Mother    Heart disease Mother    Hypertension Mother    Mental illness Mother    Cancer Father    Colon cancer Father    Alcohol abuse Brother    Drug abuse Brother    Cancer Brother    Cancer Maternal Aunt    Breast cancer Maternal Aunt    Colon cancer Maternal Grandmother    Esophageal cancer Neg Hx    Inflammatory bowel disease Neg Hx    Liver disease Neg Hx    Pancreatic cancer Neg Hx    Rectal cancer Neg Hx    Stomach cancer Neg Hx     SOCIAL HISTORY: Social History   Socioeconomic History   Marital status: Widowed    Spouse name: Not on file   Number of children: 2   Years of education: college   Highest education level: Master's degree (e.g., MA, MS, MEng, MEd, MSW, MBA)  Occupational History   Occupation: Retired  Tobacco Use   Smoking status: Never Smoker   Smokeless tobacco: Never Used  Building services engineer Use: Never used  Substance and Sexual Activity   Alcohol use: No   Drug use: No   Sexual activity: Never  Other Topics Concern   Not on file  Social History Narrative   Lives alone but her daughter lives next door.   Right-handed.   Occasional caffeine.   Social  Determinants of Health   Financial Resource Strain:    Difficulty of Paying Living Expenses: Not on file  Food Insecurity:    Worried About Programme researcher, broadcasting/film/video in the Last Year: Not on file   [employer] of Food in the Last Year: Not on file  Transportation Needs:    Lack of Transportation (Medical): Not on file   Lack of Transportation (Non-Medical): Not on file  Physical Activity:    Days of Exercise per Week: Not on file   Minutes of Exercise per Session: Not on file  Stress:    Feeling of Stress : Not on file  Social Connections:    Frequency of Communication with Friends and Family: Not on file   Frequency of Social Gatherings with Friends and Family: Not on file   Attends Religious Services: Not on file   Active Member of Clubs or Organizations: Not on file   Attends Banker Meetings: Not on file   Marital Status: Not on file  Intimate Partner Violence:    Fear of Current or Ex-Partner: Not on file   Emotionally Abused: Not on file   Physically Abused: Not on file   Sexually Abused: Not on file     PHYSICAL EXAM  Vitals:   10/13/20 1009  BP: 128/80  Pulse: 82  Weight: 157 lb (71.2 kg)  Height: 5\' 3"  (1.6 m)   Body mass index is 27.81 kg/m.  Generalized: Well developed, in no acute distress  Cardiology: normal rate  and rhythm, no murmur noted Respiratory: clear to auscultation bilaterally  Neurological examination  Mentation: Alert oriented to time, place, history taking. Follows all commands speech and language fluent Cranial nerve II-XII: Pupils were equal round reactive to light. Extraocular movements were full, visual field were full  Motor: The motor testing reveals 5 over 5 strength of all 4 extremities. Good symmetric motor tone is noted throughout.  Gait and station: Gait is normal.    DIAGNOSTIC DATA (LABS, IMAGING, TESTING) - I reviewed patient records, labs, notes, testing and imaging myself where available.  No  flowsheet data found.   Lab Results  Component Value Date   WBC 6.2 10/08/2020   HGB 10.9 (L) 10/08/2020   HCT 32.5 (L) 10/08/2020   MCV 89.7 10/08/2020   PLT 254.0 10/08/2020      Component Value Date/Time   NA 138 10/01/2020 1054   K 4.1 10/01/2020 1054   CL 105 10/01/2020 1054   CO2 26 10/01/2020 1054   GLUCOSE 98 10/01/2020 1054   BUN 25 (H) 10/01/2020 1054   CREATININE 0.73 10/01/2020 1054   CALCIUM 8.9 10/01/2020 1054   PROT 6.9 08/14/2020 1021   PROT 6.3 12/27/2018 1531   ALBUMIN 4.1 08/14/2020 1021   AST 20 08/14/2020 1021   ALT 16 08/14/2020 1021   ALKPHOS 84 08/14/2020 1021   BILITOT 0.4 08/14/2020 1021   Lab Results  Component Value Date   CHOL 198 09/03/2018   HDL 58.80 09/03/2018   LDLCALC 123 (H) 09/03/2018   TRIG 80.0 09/03/2018   CHOLHDL 3 09/03/2018   No results found for: HGBA1C Lab Results  Component Value Date   VITAMINB12 1,970 (H) 12/27/2018   Lab Results  Component Value Date   TSH 1.760 12/27/2018     ASSESSMENT AND PLAN 85 y.o. year old female  has a past medical history of Arthritis, Cataract (2010), Chicken pox, GERD (gastroesophageal reflux disease), Glaucoma, History of frequent urinary tract infections, Hyperlipidemia, Osteopenia, Peripheral neuropathy, Sleep apnea, and Urine incontinence. here with     ICD-10-CM   1. OSA on CPAP  G47.33    Z99.89      Hailey Perkins is doing well on CPAP therapy. Compliance report has showed significant improvement.  She has now 63% compliant with daily use and 57% compliant with 4-hour use. She was encouraged to continue using CPAP nightly and for greater than 4 hours each night.  She will monitor closely for any concerns of increased pressure around her face or sores in her mouth using both the oral appliance and CPAP mask.  She may try melatonin 3 mg at bedtime for concerns of insomnia.  We will update supply orders as indicated. Risks of untreated sleep apnea review and education materials  provided. Healthy lifestyle habits encouraged. She will follow up in 6 months, sooner if needed. She verbalizes understanding and agreement with this plan.   No orders of the defined types were placed in this encounter.    No orders of the defined types were placed in this encounter.     I spent 15 minutes with the patient. 50% of this time was spent counseling and educating patient on plan of care and medications.    Shawnie Dapper, FNP-C 10/13/2020, 10:46 AM [facility] [address] [place], [place] [address] [phone]

## 2020-10-15 ENCOUNTER — Other Ambulatory Visit: Payer: Self-pay

## 2020-10-15 ENCOUNTER — Ambulatory Visit
Admission: RE | Admit: 2020-10-15 | Discharge: 2020-10-15 | Disposition: A | Discharge status: Home / Self Care | Payer: Medicare Other | Source: Ambulatory Visit | Attending: Internal Medicine | Admitting: Internal Medicine

## 2020-10-15 IMAGING — MG DIGITAL SCREENING BILAT W/ TOMO W/ CAD
6 of 10 series · 6 of 30 positions shown · non-contrast
Comparison: Previous exam(s).

CLINICAL DATA: Screening.

EXAM:
DIGITAL SCREENING BILATERAL MAMMOGRAM WITH TOMO AND CAD

[R MLO synth-2D]
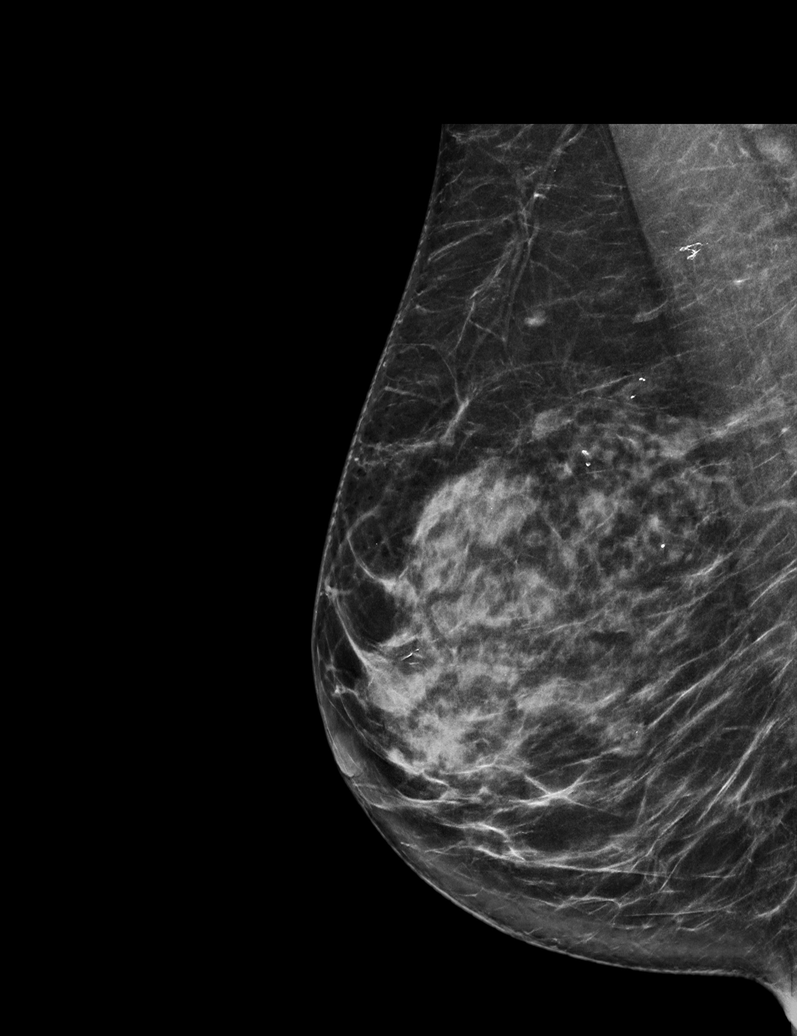

[L MLO synth-2D (1 of 2)]
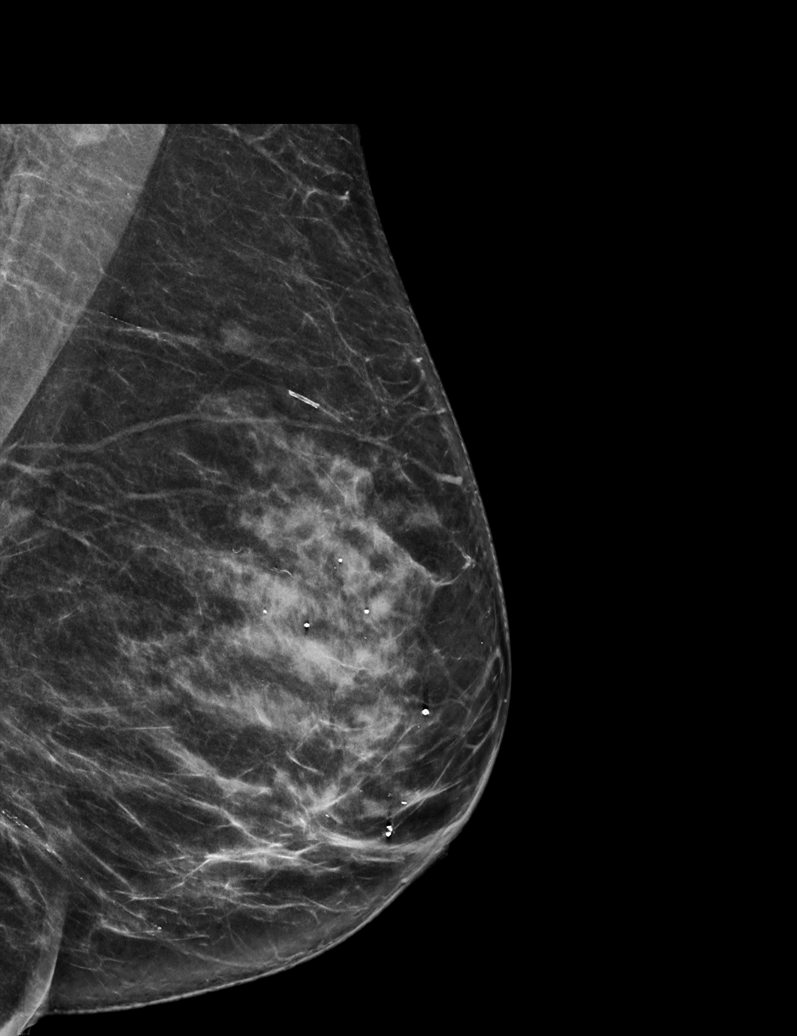

[R CC synth-2D]
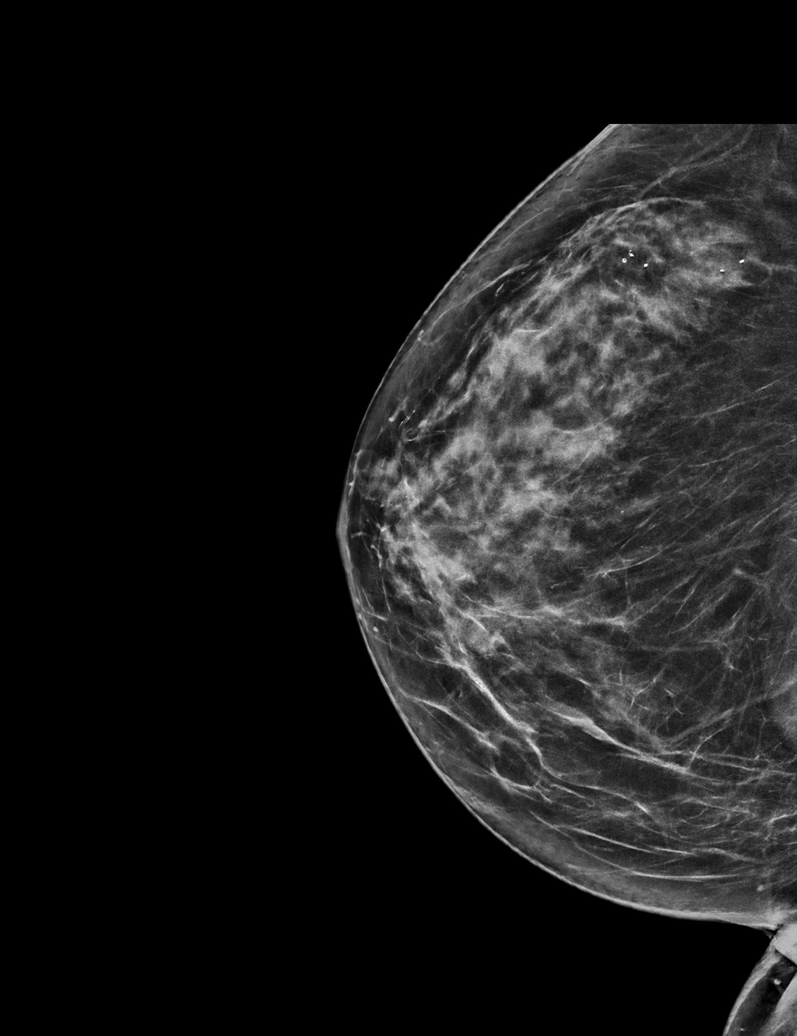

[L MLO synth-2D (2 of 2)]
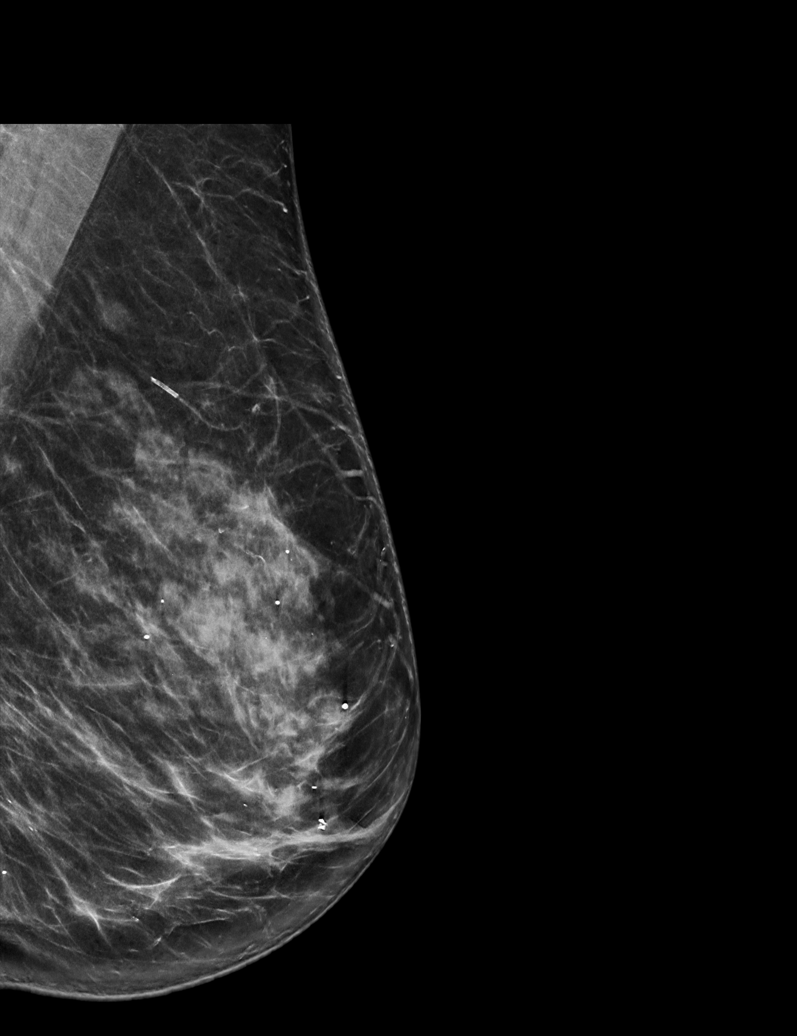

[L CC synth-2D]
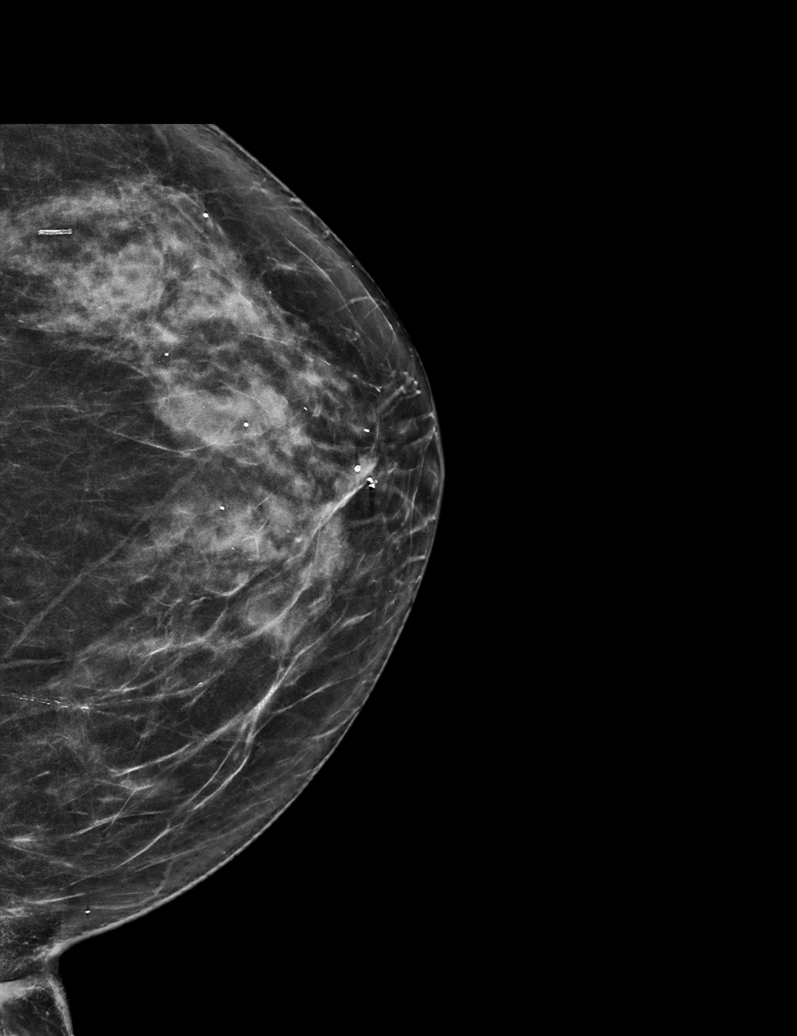

[L MLO tomo · tomo slice 34/67.0]
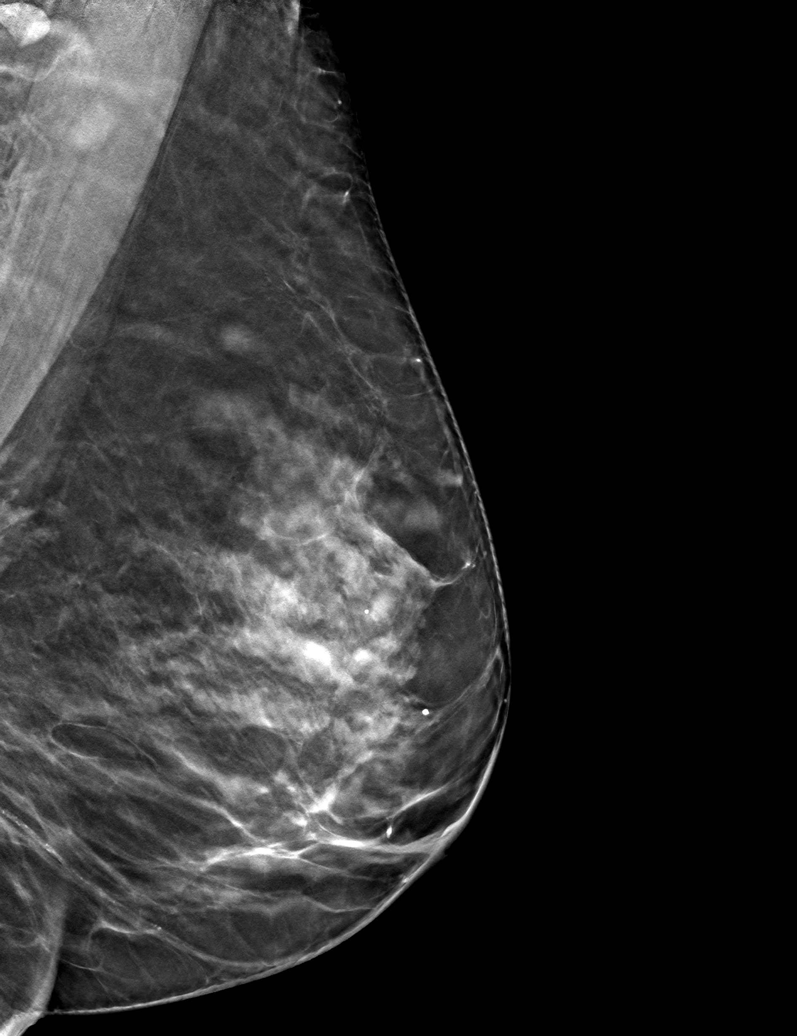

[6 of 30 positions shown; findings below may reference images not displayed]

ACR Breast Density Category c: The breast tissue is heterogeneously
dense, which may obscure small masses.
FINDINGS: There are no findings suspicious for malignancy. Images were
processed with CAD.
IMPRESSION: No mammographic evidence of malignancy. A result letter of this
screening mammogram will be mailed directly to the patient.

RECOMMENDATION:
Screening mammogram in one year. (Code:[5V])

BI-RADS CATEGORY  1: Negative.

## 2020-10-26 DIAGNOSIS — M65321 Trigger finger, right index finger: Secondary | ICD-10-CM | POA: Insufficient documentation

## 2020-10-26 DIAGNOSIS — M13841 Other specified arthritis, right hand: Secondary | ICD-10-CM | POA: Insufficient documentation

## 2020-12-11 ENCOUNTER — Other Ambulatory Visit (INDEPENDENT_AMBULATORY_CARE_PROVIDER_SITE_OTHER): Payer: Medicare Other

## 2020-12-11 DIAGNOSIS — D509 Iron deficiency anemia, unspecified: Secondary | ICD-10-CM

## 2020-12-11 LAB — CBC WITH DIFFERENTIAL/PLATELET
Basophils Absolute: 0 10*3/uL (ref 0.0–0.1)
Basophils Relative: 0.6 % (ref 0.0–3.0)
Eosinophils Absolute: 0.2 10*3/uL (ref 0.0–0.7)
Eosinophils Relative: 3.1 % (ref 0.0–5.0)
HCT: 39.6 % (ref 36.0–46.0)
Hemoglobin: 13.1 g/dL (ref 12.0–15.0)
Lymphocytes Relative: 21.6 % (ref 12.0–46.0)
Lymphs Abs: 1.4 10*3/uL (ref 0.7–4.0)
MCHC: 33 g/dL (ref 30.0–36.0)
MCV: 89 fl (ref 78.0–100.0)
Monocytes Absolute: 0.4 10*3/uL (ref 0.1–1.0)
Monocytes Relative: 5.9 % (ref 3.0–12.0)
Neutro Abs: 4.6 10*3/uL (ref 1.4–7.7)
Neutrophils Relative %: 68.8 % (ref 43.0–77.0)
Platelets: 230 10*3/uL (ref 150.0–400.0)
RBC: 4.45 Mil/uL (ref 3.87–5.11)
RDW: 13.9 % (ref 11.5–15.5)
WBC: 6.6 10*3/uL (ref 4.0–10.5)

## 2020-12-11 LAB — IBC + FERRITIN
Ferritin: 45.5 ng/mL (ref 10.0–291.0)
Iron: 75 ug/dL (ref 42–145)
Saturation Ratios: 24.4 % (ref 20.0–50.0)
Transferrin: 220 mg/dL (ref 212.0–360.0)

## 2020-12-14 ENCOUNTER — Telehealth: Payer: Self-pay | Admitting: Gastroenterology

## 2020-12-14 NOTE — Telephone Encounter (Signed)
Inbound call from patient requesting a call back please.  States she is having diarrhea and saw blood in her stool.  Please advise.

## 2020-12-14 NOTE — Telephone Encounter (Signed)
See results note 1/10

## 2020-12-14 NOTE — Telephone Encounter (Signed)
Left message on machine to call back  

## 2020-12-15 ENCOUNTER — Other Ambulatory Visit: Payer: Self-pay

## 2020-12-15 MED ORDER — CIPROFLOXACIN HCL 500 MG PO TABS: 500.0000 mg | ORAL_TABLET | Freq: Two times a day (BID) | ORAL | 0 refills | Status: DC

## 2020-12-15 MED ORDER — METRONIDAZOLE 500 MG PO TABS: 500.0000 mg | ORAL_TABLET | Freq: Two times a day (BID) | ORAL | 0 refills | Status: DC

## 2020-12-16 ENCOUNTER — Encounter: Payer: Self-pay | Admitting: Gastroenterology

## 2020-12-16 ENCOUNTER — Other Ambulatory Visit: Payer: Self-pay

## 2020-12-16 ENCOUNTER — Ambulatory Visit: Payer: Medicare Other | Admitting: Gastroenterology

## 2020-12-16 ENCOUNTER — Other Ambulatory Visit (INDEPENDENT_AMBULATORY_CARE_PROVIDER_SITE_OTHER): Payer: Medicare Other

## 2020-12-16 ENCOUNTER — Telehealth: Payer: Self-pay

## 2020-12-16 VITALS — BP 102/50 | HR 79 | Ht 63.5 in | Wt 156.0 lb

## 2020-12-16 DIAGNOSIS — K5732 Diverticulitis of large intestine without perforation or abscess without bleeding: Secondary | ICD-10-CM

## 2020-12-16 DIAGNOSIS — R933 Abnormal findings on diagnostic imaging of other parts of digestive tract: Secondary | ICD-10-CM

## 2020-12-16 DIAGNOSIS — R1032 Left lower quadrant pain: Secondary | ICD-10-CM | POA: Diagnosis not present

## 2020-12-16 DIAGNOSIS — R109 Unspecified abdominal pain: Secondary | ICD-10-CM

## 2020-12-16 DIAGNOSIS — Z8719 Personal history of other diseases of the digestive system: Secondary | ICD-10-CM

## 2020-12-16 DIAGNOSIS — K921 Melena: Secondary | ICD-10-CM

## 2020-12-16 LAB — CBC WITH DIFFERENTIAL/PLATELET
Basophils Absolute: 0.1 10*3/uL (ref 0.0–0.1)
Basophils Relative: 0.7 % (ref 0.0–3.0)
Eosinophils Absolute: 0.2 10*3/uL (ref 0.0–0.7)
Eosinophils Relative: 2.5 % (ref 0.0–5.0)
HCT: 41.2 % (ref 36.0–46.0)
Hemoglobin: 13.5 g/dL (ref 12.0–15.0)
Lymphocytes Relative: 16 % (ref 12.0–46.0)
Lymphs Abs: 1.5 10*3/uL (ref 0.7–4.0)
MCHC: 32.8 g/dL (ref 30.0–36.0)
MCV: 89 fl (ref 78.0–100.0)
Monocytes Absolute: 0.6 10*3/uL (ref 0.1–1.0)
Monocytes Relative: 6.3 % (ref 3.0–12.0)
Neutro Abs: 7.1 10*3/uL (ref 1.4–7.7)
Neutrophils Relative %: 74.5 % (ref 43.0–77.0)
Platelets: 212 10*3/uL (ref 150.0–400.0)
RBC: 4.63 Mil/uL (ref 3.87–5.11)
RDW: 13.8 % (ref 11.5–15.5)
WBC: 9.5 10*3/uL (ref 4.0–10.5)

## 2020-12-16 LAB — BASIC METABOLIC PANEL WITH GFR
BUN: 11 mg/dL (ref 6–23)
CO2: 31 meq/L (ref 19–32)
Calcium: 9.6 mg/dL (ref 8.4–10.5)
Chloride: 103 meq/L (ref 96–112)
Creatinine, Ser: 0.78 mg/dL (ref 0.40–1.20)
GFR: 70.21 mL/min
Glucose, Bld: 112 mg/dL — ABNORMAL HIGH (ref 70–99)
Potassium: 4.6 meq/L (ref 3.5–5.1)
Sodium: 138 meq/L (ref 135–145)

## 2020-12-16 LAB — SEDIMENTATION RATE: Sed Rate: 22 mm/hr (ref 0–30)

## 2020-12-16 LAB — C-REACTIVE PROTEIN: CRP: 3.5 mg/dL (ref 0.5–20.0)

## 2020-12-16 MED ORDER — AMOXICILLIN-POT CLAVULANATE 875-125 MG PO TABS: 1.0000 | ORAL_TABLET | Freq: Two times a day (BID) | ORAL | 0 refills | Status: AC

## 2020-12-16 NOTE — Patient Instructions (Addendum)
If you are age 84 or older, your body mass index should be between 23-30. Your Body mass index is 27.2 kg/m. If this is out of the aforementioned range listed, please consider follow up with your Primary Care Provider.  Your provider has requested that you go to the basement level for lab work before leaving today. Press "B" on the elevator. The lab is located at the first door on the left as you exit the elevator.  You have been scheduled for a CT scan of the abdomen and pelvis at [facility], 1st floor Radiology. You are scheduled on 12-17-2020  at 2pm. You should arrive 15 minutes prior to your appointment time for registration.  Please pick up 2 bottles of contrast from [facility] at least 3 days prior to your scan. The solution may taste better if refrigerated, but do NOT add ice or any other liquid to this solution. Shake well before drinking.   Please follow the written instructions below on the day of your exam:   1) Do not eat anything after 10am (4 hours prior to your test)   2) Drink 1 bottle of contrast @ 12pm (2 hours prior to your exam)  Remember to shake well before drinking and do NOT pour over ice.     Drink 1 bottle of contrast @ 1pm (1 hour prior to your exam)   You may take any medications as prescribed with a small amount of water, if necessary. If you take any of the following medications: METFORMIN, GLUCOPHAGE, GLUCOVANCE, AVANDAMET, RIOMET, FORTAMET, [place] MET, JANUMET, GLUMETZA or METAGLIP, you MAY be asked to HOLD this medication 48 hours AFTER the exam.   The purpose of you drinking the oral contrast is to aid in the visualization of your intestinal tract. The contrast solution may cause some diarrhea. Depending on your individual set of symptoms, you may also receive an intravenous injection of x-ray contrast/dye. Plan on being at [facility] for 45 minutes or longer, depending on the type of exam you are having performed.   If you have any questions  regarding your exam or if you need to reschedule, you may call [facility] at [phone] between the hours of 8:00 am and 5:00 pm, Monday-Friday.   Please stop Flagyl and Ciprofloxacin.  We have sent the following medications to your pharmacy for you to pick up at your convenience:  START: Augementin [id number] take one tablet twice daily for 9 days.  Due to recent changes in healthcare laws, you may see the results of your imaging and laboratory studies on MyChart before your provider has had a chance to review them.  We understand that in some cases there may be results that are confusing or concerning to you. Not all laboratory results come back in the same time frame and the provider may be waiting for multiple results in order to interpret others.  Please give [id number] hours in order for your provider to thoroughly review all the results before contacting the office for clarification of your results.   Thank you for entrusting me with your care and [facility]  Dr MRush Landmark

## 2020-12-16 NOTE — Telephone Encounter (Signed)
Patient returned my call and was instructed to discontinue Flagyl and Cipro.  Patient will start augmentin.  Patient agreed to plan and verbalized understanding. No further questions.

## 2020-12-16 NOTE — Telephone Encounter (Signed)
Left message for patient to return call to instruct her that she should discontinue Flaygl and Cipro.  Will continue efforts.

## 2020-12-16 NOTE — Progress Notes (Signed)
GASTROENTEROLOGY OUTPATIENT CLINIC VISIT   Primary Care Provider Jarome Matin, MD [address] [place] [address] [phone]  Patient Profile: Hailey Perkins is a 86 y.o. female with a pmh significant for arthritis, osteopenia, hyperlipidemia, glaucoma, urinary incontinence, peripheral neuropathy, GERD, diverticulosis (with prior diverticulitis), family history colon cancer (father in 69s and maternal grandmother), status post abdominoplasty with hernia repair (with mesh placement), status post appendectomy, status post small bowel resection (in 90s for question fatty tumor), PUD (reported in the 1970s), hiatal hernia, recent incomplete colonoscopy due to narrowing of colon (traversed with EGD scope but needs ultraslim colonoscope for complete evaluation potentially).  The patient presents to the [facility] for an evaluation and management of problem(s) noted below:  Problem List 1. LLQ pain   2. Possible diverticulitis of colon   3. History of diverticulitis   4. Hematochezia   5. Abnormal colonoscopy     History of Present Illness: Please see initial consultation note and prior progress notes by myself and NP [facility] for full details of HPI.  Interval History The patient returns for an unscheduled follow-up (was actually scheduled later in the month but had to be moved up due to symptoms).  My last evaluation of the patient was back in October when we attempted her upper and lower endoscopy.  Findings of a hiatal hernia on her upper endoscopy and attempt at colonoscopy led to an incomplete colonoscopy as a result of fixed narrowing in the left colon likely from her prior surgical history and prior episodes of diverticulitis.  We were able to traverse the area with an EGD scope but could not reach the cecum.  After her procedure she developed some bright red blood per rectum that persisted for a few days.  After a few days of monitoring as well as  monitoring her hemoglobin things subsided.  Patient was scheduled for follow-up to discuss whether a repeat attempted colonoscopy would be pursued versus CT colonography versus Cologuard testing for completion of colon cancer screening.  However, the patient developed some recent discomfort quadrant as well as a few episodes of bright red blood per rectum.  Is for this reason the patient was seen sooner than had been anticipated.  The patient describes over the course of the last few days experiencing discomfort as well as bloating and nausea.  The pain is in the left lower quadrant region.  It is very similar to her prior episodes of diverticulitis.  She is worried about this.  She has not had any fevers.  Patient has changed her diet to a brat diet and this has somewhat helped.  After hearing about her symptoms we sent antibiotics and had her initiate those yesterday.  Patient feels somewhat better after days of antibiotics but has concerns about ciprofloxacin as a result of having issues with her tendons in the past.  She has not had any more bright red blood per rectum for 24 hours.  She is still experiencing GERD symptoms at times.  GI Review of Systems Positive as above Negative for odynophagia, dysphagia, melena  Review of Systems General: Denies fevers/chills/weight loss unintentionally Cardiovascular: Denies chest pain/palpitations Pulmonary: Denies shortness of breath Gastroenterological: See HPI Genitourinary: Denies darkened urine Hematological: Denies easy bruising/bleeding Dermatological: Denies jaundice Psychological: Mood is stable   Medications Current Outpatient Medications  Medication Sig Dispense Refill  . amoxicillin-clavulanate (AUGMENTIN) 875-125 MG tablet Take 1 tablet by mouth 2 (two) times daily for 9 days. 18 tablet 0  .  Ascorbic Acid (VITAMIN C) 1000 MG tablet Take 1,000 mg by mouth daily.    . betamethasone dipropionate 0.05 % lotion APP ON THE SKIN D  0  . Biotin  10 MG TABS Take by mouth.    . Calcium Citrate-Vitamin D (CALCIUM + D PO) Take by mouth daily.    . celecoxib (CELEBREX) 200 MG capsule Take 200 mg by mouth daily.    . Cholecalciferol (VITAMIN D3) 2000 units TABS Take 1 tablet by mouth daily.    . diclofenac Sodium (VOLTAREN) 1 % GEL     . dorzolamide (TRUSOPT) 2 % ophthalmic solution 1 drop 2 (two) times daily.    . DULoxetine (CYMBALTA) 30 MG capsule TAKE 1 CAPSULE(30 MG) BY MOUTH DAILY 90 capsule 2  . Ginger, Zingiber officinalis, (GINGER ROOT PO) Take by mouth.    Marland Kitchen GINKGO BILOBA PO Take by mouth daily.    Marland Kitchen GLUCOSAMINE-CHONDROITIN-MSM PO Take by mouth. Take 2 oz. Daily.  Glucosamine 2,000 mg Chondroitin 1,200 mg, MSM 500 mg, Hyaluronic Acid 10 mg.    . Magnesium 250 MG TABS Take 1 tablet by mouth daily.    . metroNIDAZOLE (METROGEL) 1 % gel Apply 1 application topically as needed (rosacea).    . Misc Natural Products (TART CHERRY ADVANCED PO) Take by mouth.    . modafinil (PROVIGIL) 100 MG tablet Take 100 mg by mouth as needed.    . Multiple Vitamins-Minerals (PRESERVISION AREDS PO) Take by mouth daily.    . Omega-3 Fatty Acids (FISH OIL) 1000 MG CAPS Take 1 capsule by mouth.    . pantoprazole (PROTONIX) 20 MG tablet Take 1 tablet (20 mg total) by mouth 2 (two) times daily. 60 tablet 2  . Plant Sterols and Stanols (CHOLESTOFF PLUS PO) Take 1 capsule by mouth daily.    . polyethylene glycol powder (GLYCOLAX/MIRALAX) 17 GM/SCOOP powder Take by mouth 4 (four) times daily as needed.     . psyllium (METAMUCIL) 58.6 % packet Take 1 packet by mouth daily.    . Sodium Fluoride 1.1 % PSTE Place onto teeth daily.    . TURMERIC CURCUMIN PO Take by mouth daily.    Marland Kitchen UNABLE TO FIND CBD oil for pain, as needed.  Hemp flower extract for inflammation, as needed.     No current facility-administered medications for this visit.    Allergies Allergies  Allergen Reactions  . Protonix [Pantoprazole Sodium]     Pt stated, "Blood pin-point rash"  .  Tape Other (See Comments)    Adhesive Tape; pt stated "itch and redness" Adhesive Tape; pt stated "itch and redness"  . Pantoprazole Rash    Histories Past Medical History:  Diagnosis Date  . Arthritis   . Cataract 2010   bilateral eyes  . Chicken pox   . GERD (gastroesophageal reflux disease)   . Glaucoma   . History of frequent urinary tract infections   . Hyperlipidemia   . Osteopenia   . Peripheral neuropathy   . Sleep apnea    wears a CPAP  . Urine incontinence    Past Surgical History:  Procedure Laterality Date  . ABDOMINAL HYSTERECTOMY    . APPENDECTOMY    . BREAST BIOPSY Bilateral 10+ yrs ago   BENIGN  . BUNIONECTOMY Left 1975   Left Toe  . HAMMER TOE SURGERY Right 2005   Right foot correction hammertoe little toe  . NEUROMA SURGERY Right 2003   Right Foot Neuroma, plus Bunionectomy, Right Great Toe  . TONSILLECTOMY  AND ADENOIDECTOMY     Social History   Socioeconomic History  . Marital status: Widowed    Spouse name: Not on file  . Number of children: 2  . Years of education: college  . Highest education level: Master's degree (e.g., MA, MS, MEng, MEd, MSW, MBA)  Occupational History  . Occupation: Retired  Tobacco Use  . Smoking status: Never Smoker  . Smokeless tobacco: Never Used  Vaping Use  . Vaping Use: Never used  Substance and Sexual Activity  . Alcohol use: No  . Drug use: No  . Sexual activity: Never  Other Topics Concern  . Not on file  Social History Narrative   Lives alone but her daughter lives next door.   Right-handed.   Occasional caffeine.   Social Determinants of Health   Financial Resource Strain: Not on file  Food Insecurity: Not on file  Transportation Needs: Not on file  Physical Activity: Not on file  Stress: Not on file  Social Connections: Not on file  Intimate Partner Violence: Not on file   Family History  Problem Relation Age of Onset  . Hyperlipidemia Mother   . Heart disease Mother   . Hypertension  Mother   . Mental illness Mother   . Cancer Father   . Colon cancer Father   . Alcohol abuse Brother   . Drug abuse Brother   . Cancer Brother   . Cancer Maternal Aunt   . Breast cancer Maternal Aunt   . Colon cancer Maternal Grandmother   . Esophageal cancer Neg Hx   . Inflammatory bowel disease Neg Hx   . Liver disease Neg Hx   . Pancreatic cancer Neg Hx   . Rectal cancer Neg Hx   . Stomach cancer Neg Hx    I have reviewed her medical, social, and family history in detail and updated the electronic medical record as necessary.    PHYSICAL EXAMINATION  BP (!) 102/50   Pulse 79   Ht 5' 3.5" (1.613 m)   Wt 156 lb (70.8 kg)   SpO2 96%   BMI 27.20 kg/m  Wt Readings from Last 3 Encounters:  12/16/20 156 lb (70.8 kg)  10/13/20 157 lb (71.2 kg)  09/29/20 157 lb (71.2 kg)  GEN: NAD, appears stated age, doesn't appear chronically ill PSYCH: Cooperative, without pressured speech EYE: Conjunctivae pink, sclerae anicteric ENT: Masked CV: Nontachycardic RESP: No audible wheezing GI: NABS, soft, mild tenderness to palpation in the left lower quadrant and left upper quadrant region, nondistended, surgical scars present, no rebound, volitional guarding is present in the left lower quadrant upon deep palpation  MSK/EXT: No significant lower extremity edema SKIN: No jaundice NEURO:  Alert & Oriented x 3, no focal deficits   REVIEW OF DATA  I reviewed the following data at the time of this encounter:  GI Procedures and Studies  October 2021 EGD - No gross lesions in esophagus. Biopsied. - 3 cm hiatal hernia. - Gastritis. No other gross lesions in the stomach. Biopsied. - No gross lesions in the duodenal bulb, in the first portion of the duodenum and in the second portion of the duodenum.  October 2021 colonoscopy attempt - Hemorrhoids found on digital rectal exam. - Diverticulosis in the left colon. - Narrowing as a result of a fixed colon in the recto-sigmoid colon and in the  sigmoid colon - only traversed with the adult endoscope. - Cecum not intubated due to length of endoscope. - Normal mucosa in the  entire examined colon. Biopsied. - Non-bleeding non-thrombosed external and internal hemorrhoids.  Pathology Diagnosis 1. Surgical [P], gastric bx - GASTRIC ANTRAL MUCOSA WITH NO SPECIFIC HISTOPATHOLOGIC CHANGES - GASTRIC OXYNTIC MUCOSA WITH PARIETAL CELL HYPERPLASIA AS CAN BE SEEN IN HYPERGASTRINEMIC STATES SUCH AS PPI THERAPY. - WARTHIN STARRY STAIN IS NEGATIVE FOR HELICOBACTER PYLORI 2. Surgical [P], random esophageal - ESOPHAGEAL SQUAMOUS MUCOSA WITH NO SPECIFIC HISTOPATHOLOGIC CHANGES - NEGATIVE FOR INCREASED INTRAEPITHELIAL EOSINOPHILS 3. Surgical [P], colon, random sites - COLONIC MUCOSA WITH NO SPECIFIC HISTOPATHOLOGIC CHANGES - NEGATIVE FOR ACUTE INFLAMMATION, INCREASED INTRAEPITHELIAL LYMPHOCYTES OR THICKENED SUBEPITHELIAL COLLAGEN TABLE  Laboratory Studies  Reviewed those that were in epic  Imaging Studies  No relevant studies to review   ASSESSMENT  Ms. Vandehei is a 86 y.o. female with a pmh significant for arthritis, osteopenia, hyperlipidemia, glaucoma, urinary incontinence, peripheral neuropathy, GERD, diverticulosis (with prior diverticulitis), family history colon cancer (father in 3850s and maternal grandmother), status post abdominoplasty with hernia repair (with mesh placement), status post appendectomy, status post small bowel resection (in 90s for question fatty tumor), PUD (reported in the 1970s), hiatal hernia, recent incomplete colonoscopy due to narrowing of colon (traversed with EGD scope but needs ultraslim colonoscope for complete evaluation potentially).  The patient is seen today for evaluation and management of:  1. LLQ pain   2. Possible diverticulitis of colon   3. History of diverticulitis   4. Hematochezia   5. Abnormal colonoscopy    The patient is hemodynamically stable.  Clinically however, I do have some concern for  the possibility of underlying diverticulitis as the source of her symptoms as well as her pain.  Thankfully, her hematochezia has abated.  We had recent blood counts that showed normal CBC.  However with the concern for potential diverticulitis we will repeat laboratories and check her hemoglobin once again.  Some of her work-up had been done in the setting of some iron deficiency anemia in the past.  With that being said, there has been the question of whether we need to do further evaluation from a colonoscopy perspective versus Cologuard testing for CT colonography perspective in regards to completely "clearing the colon" from a colon cancer screening perspective.  I do think that with the use of an ultraslim colonoscope we would have a much easier attempt at being able to complete her colonoscopy however we do not want to be on thoughtful in regards to the patient's age and the risks of procedures moving forward.  Due to the acute issues of pain and recent hematochezia, we will hold on making a final decision in regards to the final colon cancer screening evaluation but do need to keep that in mind for our next conversation.  We are going to transition her from ciprofloxacin/Flagyl to Augmentin due to concerns of tendon issues that can occur with ciprofloxacin.  She will initiate that now.  We will try to get a CT abdomen/pelvis performed within the next couple of days.  Even if the CT abdomen pelvis does not show evidence of overt diverticulitis I will treat her for at least 7 days with the question of whether we had initially treated diverticulitis and things may look better by the time we do her imaging study.  We will see what her laboratories look like as well.  She may continue her PPI therapy at current dosing.  All patient questions were answered to the best of my ability, and the patient agrees to the aforementioned plan of action with follow-up  as indicated.   PLAN  Continue current PPI  dosing Transition ciprofloxacin/Flagyl to Augmentin 875 mg twice daily CT abdomen/pelvis to be performed to evaluate for possible diverticulitis and source of left lower quadrant abdominal discomfort and recent hematochezia Holding on hiatal hernia/fundoplication evaluation for now Will need to consider repeat colonoscopy (with ultraslim colonoscope in hospital-based setting) versus Cologuard testing versus CT colonoscopy in future for "completion colon cancer screening"   Orders Placed This Encounter  Procedures  . CBC w/Diff  . Basic metabolic panel  . Sedimentation rate  . C-reactive protein    New Prescriptions   AMOXICILLIN-CLAVULANATE (AUGMENTIN) 875-125 MG TABLET    Take 1 tablet by mouth 2 (two) times daily for 9 days.   Modified Medications   No medications on file    Planned Follow Up: No follow-ups on file.   Total Time in Face-to-Face and in Coordination of Care for patient including independent/personal interpretation/review of prior testing, medical history, examination, medication adjustment, communicating results with the patient directly, and documentation with the EHR is 25 minutes.    Corliss Parish, MD [facility] Office # [phone]

## 2020-12-17 ENCOUNTER — Ambulatory Visit (HOSPITAL_COMMUNITY)
Admission: RE | Admit: 2020-12-17 | Discharge: 2020-12-17 | Disposition: A | Discharge status: Home / Self Care | Payer: Medicare Other | Source: Ambulatory Visit | Attending: Gastroenterology | Admitting: Gastroenterology

## 2020-12-17 ENCOUNTER — Other Ambulatory Visit: Payer: Self-pay

## 2020-12-17 DIAGNOSIS — R109 Unspecified abdominal pain: Secondary | ICD-10-CM | POA: Diagnosis not present

## 2020-12-17 IMAGING — CT CT ABD-PELV W/ CM
2 of 5 series · 16 of 46 positions shown, 18 images · IV contrast (APPLIED)
Comparison: [DATE]

CLINICAL DATA: Left-sided abdominal pain for 5 days.
Diverticulosis.

EXAM:
CT ABDOMEN AND PELVIS WITH CONTRAST
TECHNIQUE: Multidetector CT imaging of the abdomen and pelvis was performed
using the standard protocol following bolus administration of
intravenous contrast.
CONTRAST:  100mL OMNIPAQUE IOHEXOL 300 MG/ML  SOLN

[Series 2: axial st · axial · 0.76mm/px · z∈[-584,-229]mm · 13 of 83 slices shown, 15 images]
[im 6/83  soft-tissue]
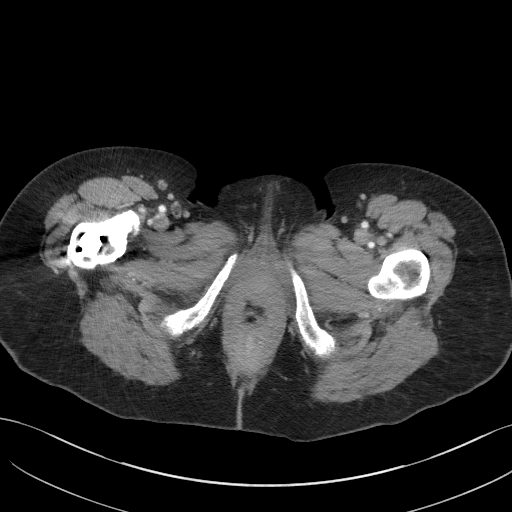
[im 6/83  bone]
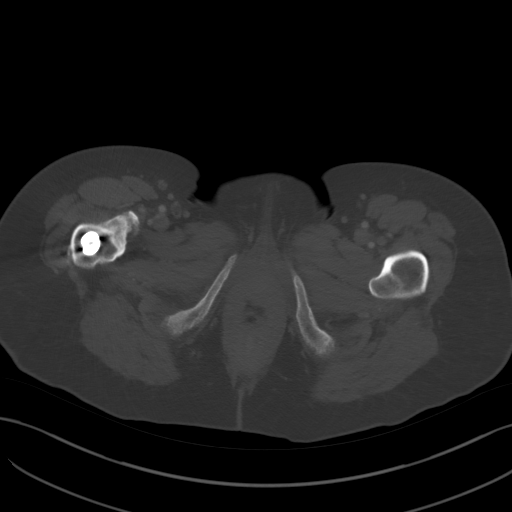
[im 11/83  soft-tissue]
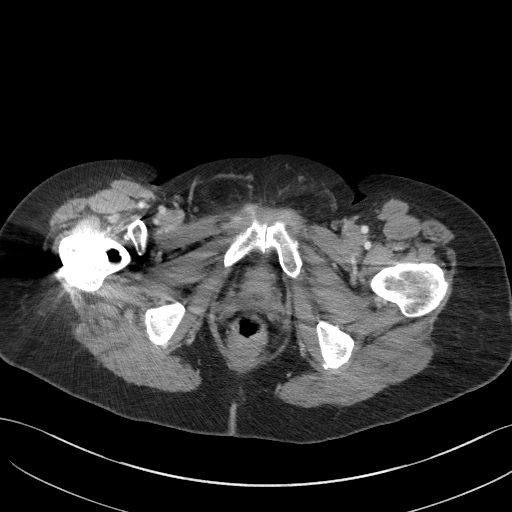
[im 17/83  soft-tissue]
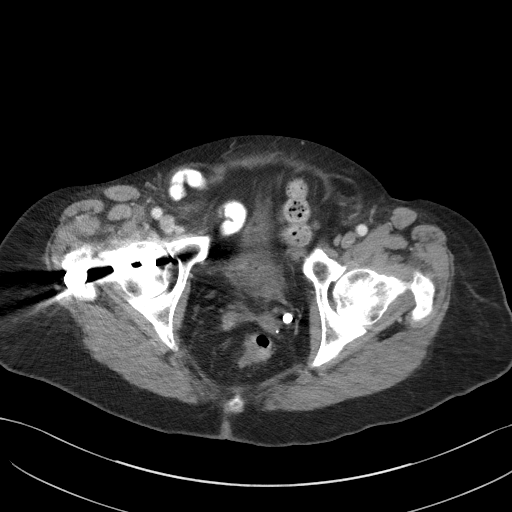
[im 22/83  soft-tissue]
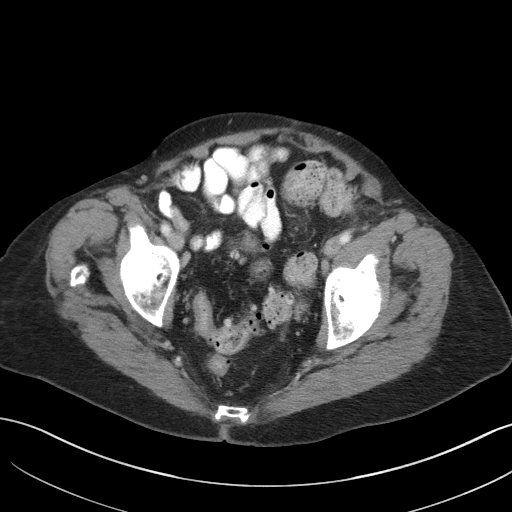
[im 28/83  soft-tissue]
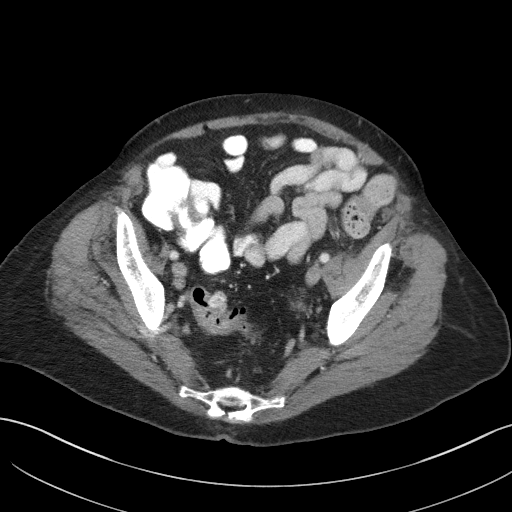
[im 33/83  soft-tissue]
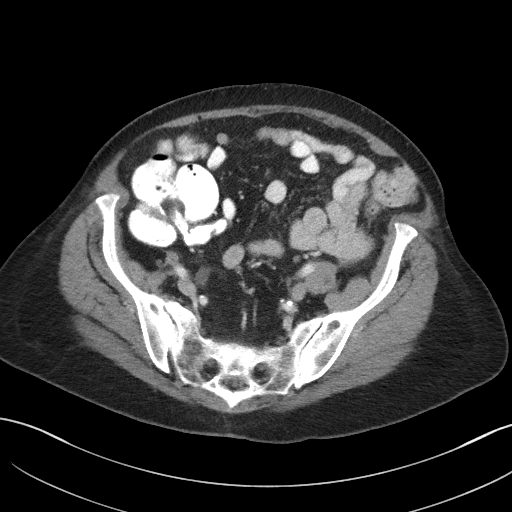
[im 44/83  soft-tissue]
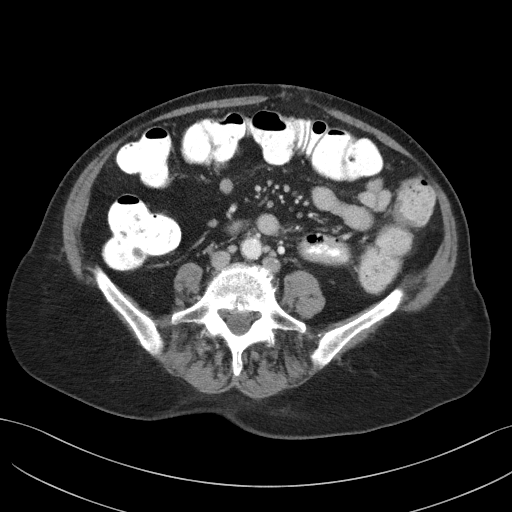
[im 50/83  soft-tissue]
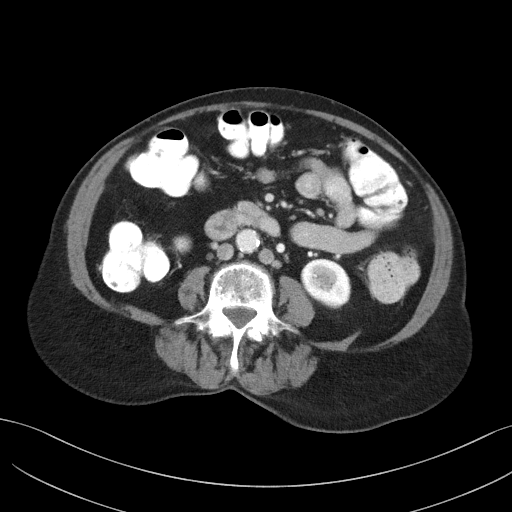
[im 55/83  soft-tissue]
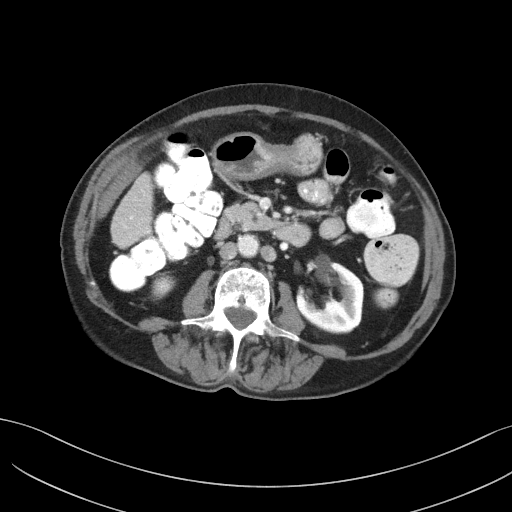
[im 55/83  bone]
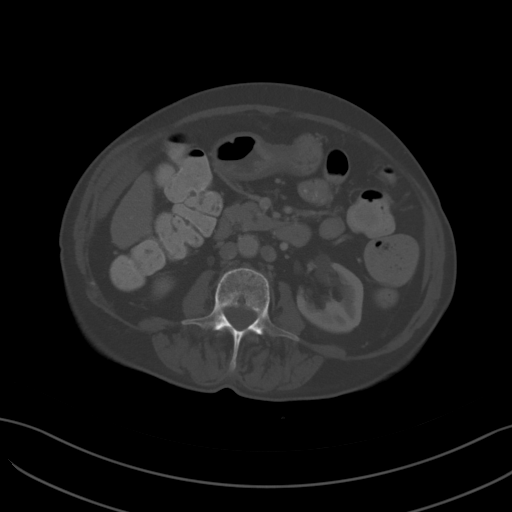
[im 61/83  soft-tissue]
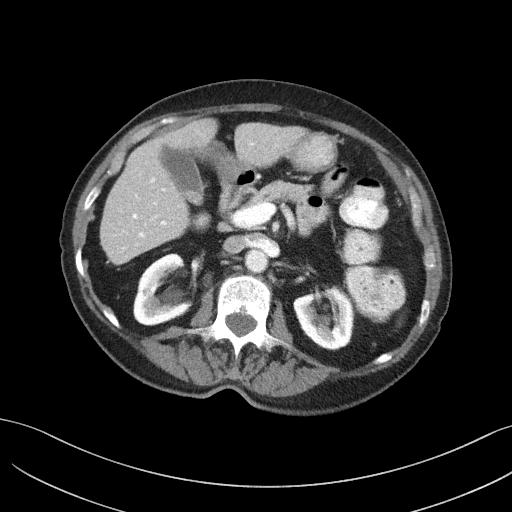
[im 66/83  soft-tissue]
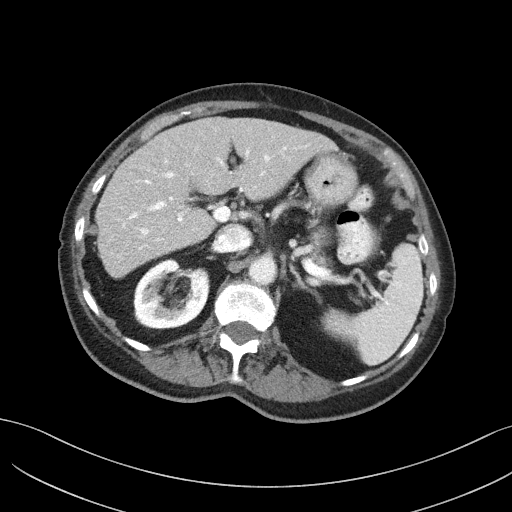
[im 72/83  soft-tissue]
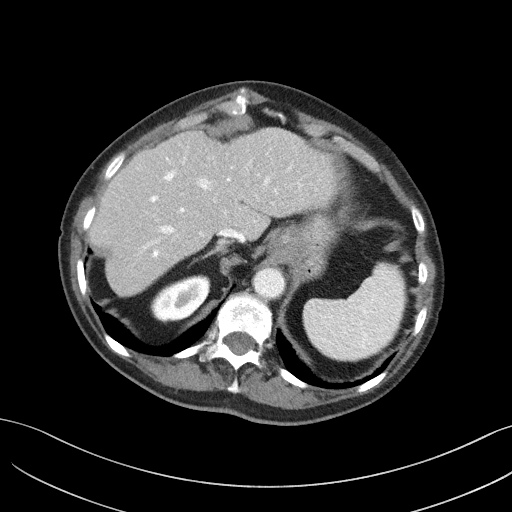
[im 77/83  soft-tissue]
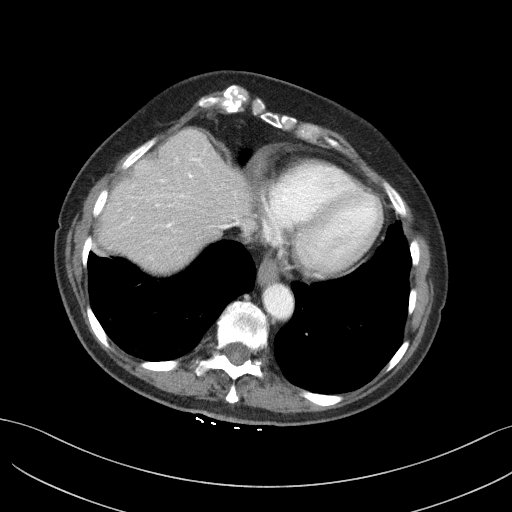

[Series 5: coronal st · coronal · 0.68mm/px · 3 of 101 slices shown]
[im 34/101  soft-tissue]
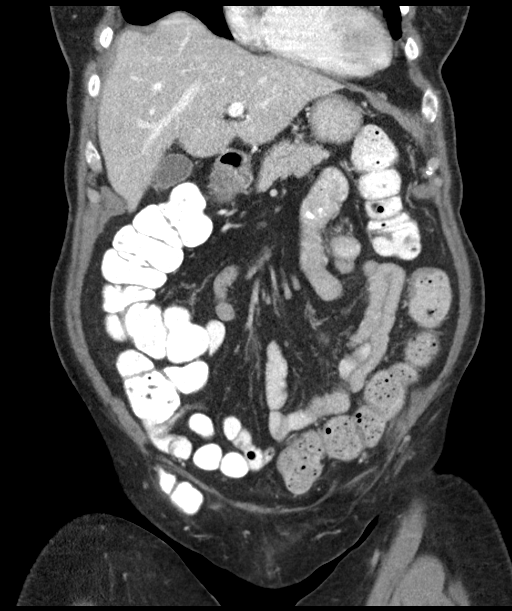
[im 45/101  soft-tissue]
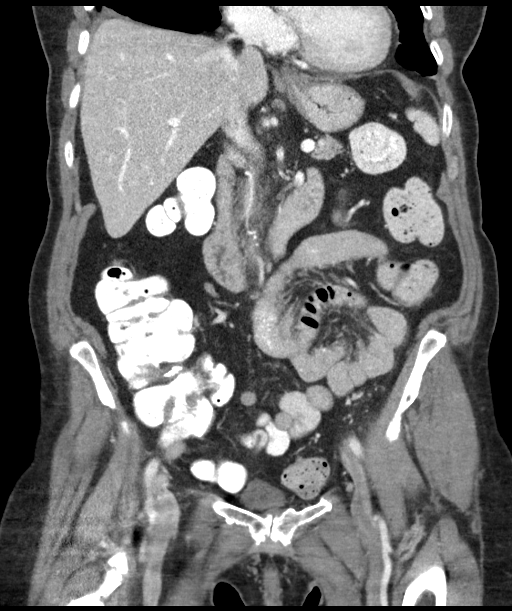
[im 56/101  soft-tissue]
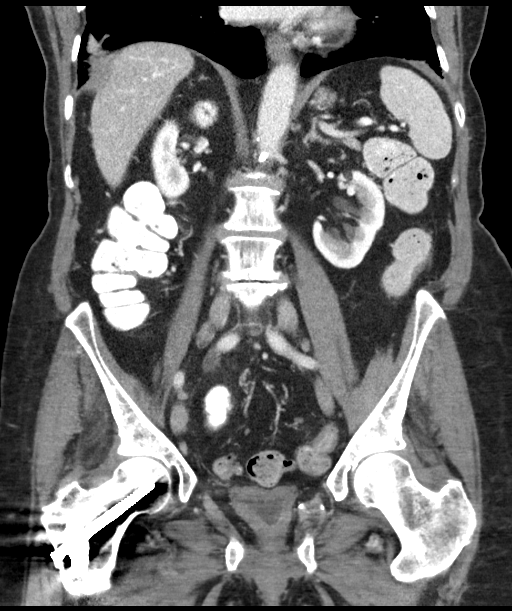

[16 of 46 positions shown; findings below may reference images not displayed]

FINDINGS: Lower Chest: No acute findings.  Bibasilar scarring again noted.

Hepatobiliary: No hepatic masses identified. Tiny sub-cm cyst in
left hepatic lobe remains stable. A few small gallstones are noted,
however there is no evidence of cholecystitis or biliary ductal
dilatation.

Pancreas:  No mass or inflammatory changes.

Spleen: Within normal limits in size and appearance.

Adrenals/Urinary Tract: No masses identified. A few small renal
sinus cysts are noted bilaterally. No evidence of ureteral calculi
or hydronephrosis.

Stomach/Bowel: No evidence of obstruction, inflammatory process or
abnormal fluid collections. Diverticulosis is seen mainly involving
the sigmoid colon, however there is no evidence of diverticulitis.

Vascular/Lymphatic: No pathologically enlarged lymph nodes. No
abdominal aortic aneurysm. Aortic atherosclerotic calcification
noted. Congenital duplication of IVC is incidentally noted.

Reproductive: Prior hysterectomy noted. Adnexal regions are
unremarkable in appearance.

Other: A small right inguinal hernia is seen containing a small
bowel loop. No evidence of bowel obstruction or strangulation.

Musculoskeletal: No suspicious bone lesions identified. Internal
fixation hardware is again noted in the right hip.
IMPRESSION: Colonic diverticulosis. No radiographic evidence of diverticulitis
or other acute findings.

Cholelithiasis. No radiographic evidence of cholecystitis.

Small right inguinal hernia containing a small bowel loop. No
evidence of obstruction or strangulation.

Aortic Atherosclerosis ([9K]-[9K]).

## 2020-12-17 MED ORDER — IOHEXOL 300 MG/ML  SOLN
100.0000 mL | Freq: Once | INTRAMUSCULAR | Status: AC | PRN
  Administered 2020-12-17: 100 mL via INTRAVENOUS

## 2021-01-06 ENCOUNTER — Other Ambulatory Visit: Payer: Self-pay

## 2021-01-06 ENCOUNTER — Telehealth: Payer: Self-pay

## 2021-01-06 DIAGNOSIS — Z8719 Personal history of other diseases of the digestive system: Secondary | ICD-10-CM

## 2021-01-06 NOTE — Telephone Encounter (Signed)
Reply to patient MyChart message. Plan for hospital-based colonoscopy with ultraslim colonoscope availability for attempt at completion of colonoscopy scheduled next available and at patient convenience. Plan for colorectal surgery evaluation for consideration of segmental colectomy in setting of multiple prior diverticulitis attacks and known colonic narrowing as result of previous issues (Dr. Carmelina Peal. Thomas/Dr. Cliffton Asters preference). Thanks. GM

## 2021-01-06 NOTE — Telephone Encounter (Signed)
Colon scheduled for 02/15/21 at 830 am at [facility] with Dr Meridee Score COVID test on 3/10 at 1010 am

## 2021-01-06 NOTE — Telephone Encounter (Signed)
[facility] referral made and records faxed.  Hosp colon needed with ultraslim scope.

## 2021-01-06 NOTE — Telephone Encounter (Signed)
The patient has been notified of this information and all questions answered. Colon scheduled, pt instructed and medications reviewed.  Patient instructions mailed to home and available in My Chart.  Patient to call with any questions or concerns.

## 2021-01-06 NOTE — Telephone Encounter (Signed)
Reply to patient MyChart message. Plan for hospital-based colonoscopy with ultraslim colonoscope availability for attempt at completion of colonoscopy scheduled next available and at patient convenience. Plan for colorectal surgery evaluation for consideration of segmental colectomy in setting of multiple prior diverticulitis attacks and known colonic narrowing as result of previous issues (Dr. Gross/Dr. Thomas/Dr. White preference). Thanks. GM 

## 2021-01-29 ENCOUNTER — Other Ambulatory Visit: Payer: Self-pay | Admitting: Internal Medicine

## 2021-01-29 DIAGNOSIS — M21372 Foot drop, left foot: Secondary | ICD-10-CM

## 2021-01-29 DIAGNOSIS — M21371 Foot drop, right foot: Secondary | ICD-10-CM

## 2021-02-05 ENCOUNTER — Other Ambulatory Visit: Payer: Self-pay

## 2021-02-11 ENCOUNTER — Other Ambulatory Visit (HOSPITAL_COMMUNITY)
Admission: RE | Admit: 2021-02-11 | Discharge: 2021-02-11 | Disposition: A | Discharge status: Home / Self Care | Payer: Medicare Other | Source: Ambulatory Visit | Attending: Gastroenterology | Admitting: Gastroenterology

## 2021-02-11 DIAGNOSIS — Z20822 Contact with and (suspected) exposure to covid-19: Secondary | ICD-10-CM | POA: Diagnosis not present

## 2021-02-11 DIAGNOSIS — Z01812 Encounter for preprocedural laboratory examination: Secondary | ICD-10-CM | POA: Diagnosis present

## 2021-02-11 LAB — SARS CORONAVIRUS 2 (TAT 6-24 HRS): SARS Coronavirus 2: NEGATIVE

## 2021-02-14 NOTE — Anesthesia Preprocedure Evaluation (Addendum)
Anesthesia Evaluation  Patient identified by MRN, date of birth, ID band Patient awake    Reviewed: Allergy & Precautions, H&P , NPO status , Patient's Chart, lab work & pertinent test results  Airway Mallampati: II  TM Distance: >3 FB Neck ROM: Full    Dental no notable dental hx. (+) Teeth Intact, Dental Advisory Given   Pulmonary sleep apnea and Continuous Positive Airway Pressure Ventilation ,    Pulmonary exam normal breath sounds clear to auscultation       Cardiovascular Exercise Tolerance: Good negative cardio ROS   Rhythm:Regular Rate:Normal     Neuro/Psych negative neurological ROS  negative psych ROS   GI/Hepatic Neg liver ROS, GERD  Medicated,  Endo/Other  negative endocrine ROS  Renal/GU negative Renal ROS  negative genitourinary   Musculoskeletal  (+) Arthritis , Osteoarthritis,    Abdominal   Peds  Hematology negative hematology ROS (+)   Anesthesia Other Findings   Reproductive/Obstetrics negative OB ROS                            Anesthesia Physical Anesthesia Plan  ASA: III  Anesthesia Plan: MAC   Post-op Pain Management:    Induction: Intravenous  PONV Risk Score and Plan: 2 and Propofol infusion and Treatment may vary due to age or medical condition  Airway Management Planned: Simple Face Mask  Additional Equipment:   Intra-op Plan:   Post-operative Plan:   Informed Consent: I have reviewed the patients History and Physical, chart, labs and discussed the procedure including the risks, benefits and alternatives for the proposed anesthesia with the patient or authorized representative who has indicated his/her understanding and acceptance.     Dental advisory given  Plan Discussed with: CRNA  Anesthesia Plan Comments:        Anesthesia Quick Evaluation

## 2021-02-15 ENCOUNTER — Other Ambulatory Visit: Payer: Self-pay

## 2021-02-15 ENCOUNTER — Ambulatory Visit (HOSPITAL_COMMUNITY)
Admission: RE | Admit: 2021-02-15 | Discharge: 2021-02-15 | Disposition: A | Discharge status: Home / Self Care | Payer: Medicare Other | Attending: Gastroenterology | Admitting: Gastroenterology

## 2021-02-15 ENCOUNTER — Encounter (HOSPITAL_COMMUNITY)
Admission: RE | Disposition: A | Discharge status: Home / Self Care | Payer: Self-pay | Source: Home / Self Care | Attending: Gastroenterology

## 2021-02-15 ENCOUNTER — Encounter (HOSPITAL_COMMUNITY): Payer: Self-pay | Admitting: Gastroenterology

## 2021-02-15 ENCOUNTER — Ambulatory Visit (HOSPITAL_COMMUNITY): Payer: Medicare Other | Admitting: Anesthesiology

## 2021-02-15 DIAGNOSIS — Z888 Allergy status to other drugs, medicaments and biological substances status: Secondary | ICD-10-CM | POA: Diagnosis not present

## 2021-02-15 DIAGNOSIS — K56609 Unspecified intestinal obstruction, unspecified as to partial versus complete obstruction: Secondary | ICD-10-CM | POA: Insufficient documentation

## 2021-02-15 DIAGNOSIS — K635 Polyp of colon: Secondary | ICD-10-CM

## 2021-02-15 DIAGNOSIS — Z818 Family history of other mental and behavioral disorders: Secondary | ICD-10-CM | POA: Insufficient documentation

## 2021-02-15 DIAGNOSIS — Z803 Family history of malignant neoplasm of breast: Secondary | ICD-10-CM | POA: Diagnosis not present

## 2021-02-15 DIAGNOSIS — Z800 Family history of malignant neoplasm of digestive organs: Secondary | ICD-10-CM | POA: Insufficient documentation

## 2021-02-15 DIAGNOSIS — K641 Second degree hemorrhoids: Secondary | ICD-10-CM | POA: Diagnosis not present

## 2021-02-15 DIAGNOSIS — Z811 Family history of alcohol abuse and dependence: Secondary | ICD-10-CM | POA: Insufficient documentation

## 2021-02-15 DIAGNOSIS — Z1211 Encounter for screening for malignant neoplasm of colon: Secondary | ICD-10-CM

## 2021-02-15 DIAGNOSIS — Z8249 Family history of ischemic heart disease and other diseases of the circulatory system: Secondary | ICD-10-CM | POA: Diagnosis not present

## 2021-02-15 DIAGNOSIS — K644 Residual hemorrhoidal skin tags: Secondary | ICD-10-CM | POA: Diagnosis not present

## 2021-02-15 DIAGNOSIS — Z87440 Personal history of urinary (tract) infections: Secondary | ICD-10-CM | POA: Diagnosis not present

## 2021-02-15 DIAGNOSIS — K56699 Other intestinal obstruction unspecified as to partial versus complete obstruction: Secondary | ICD-10-CM

## 2021-02-15 DIAGNOSIS — K5730 Diverticulosis of large intestine without perforation or abscess without bleeding: Secondary | ICD-10-CM

## 2021-02-15 DIAGNOSIS — Z8349 Family history of other endocrine, nutritional and metabolic diseases: Secondary | ICD-10-CM | POA: Insufficient documentation

## 2021-02-15 HISTORY — PX: COLONOSCOPY WITH PROPOFOL: SHX5780

## 2021-02-15 HISTORY — PX: POLYPECTOMY: SHX5525

## 2021-02-15 HISTORY — PX: BIOPSY: SHX5522

## 2021-02-15 SURGERY — COLONOSCOPY WITH PROPOFOL
Anesthesia: Monitor Anesthesia Care

## 2021-02-15 MED ORDER — PROPOFOL 500 MG/50ML IV EMUL
INTRAVENOUS | Status: AC
  Filled 2021-02-15: qty 50

## 2021-02-15 MED ORDER — LACTATED RINGERS IV SOLN: INTRAVENOUS | Status: DC

## 2021-02-15 MED ORDER — PROPOFOL 10 MG/ML IV BOLUS
INTRAVENOUS | Status: DC | PRN
  Administered 2021-02-15: 20 mg via INTRAVENOUS

## 2021-02-15 MED ORDER — PROPOFOL 500 MG/50ML IV EMUL
INTRAVENOUS | Status: DC | PRN
  Administered 2021-02-15: 125 ug/kg/min via INTRAVENOUS

## 2021-02-15 MED ORDER — DICYCLOMINE HCL 10 MG PO CAPS: 10.0000 mg | ORAL_CAPSULE | Freq: Three times a day (TID) | ORAL | 0 refills | Status: DC

## 2021-02-15 MED ORDER — PROPOFOL 10 MG/ML IV BOLUS
INTRAVENOUS | Status: AC
  Filled 2021-02-15: qty 20

## 2021-02-15 MED ORDER — PROPOFOL 1000 MG/100ML IV EMUL
INTRAVENOUS | Status: AC
  Filled 2021-02-15: qty 100

## 2021-02-15 SURGICAL SUPPLY — 21 items

## 2021-02-15 NOTE — Transfer of Care (Signed)
Immediate Anesthesia Transfer of Care Note  Patient: Hailey Perkins  Procedure(s) Performed: COLONOSCOPY WITH PROPOFOL (N/A ) BIOPSY POLYPECTOMY  Patient Location:  Endo  Anesthesia Type:MAC  Level of Consciousness: awake, alert  and oriented  Airway & Oxygen Therapy: Patient Spontanous Breathing  Post-op Assessment: Report given to RN and Post -op Vital signs reviewed and stable  Post vital signs: Reviewed and stable  Last Vitals:  Vitals Value Taken Time  BP 156/77 02/15/21 0945  Temp    Pulse 73 02/15/21 0946  Resp 14 02/15/21 0946  SpO2 100 % 02/15/21 0946  Vitals shown include unvalidated device data.  Last Pain:  Vitals:   02/15/21 0757  TempSrc: Oral  PainSc: 0-No pain         Complications: No complications documented.

## 2021-02-15 NOTE — H&P (Signed)
GASTROENTEROLOGY PROCEDURE H&P NOTE   Primary Care Physician: Jarome Matin, MD  HPI: Hailey Perkins is a 86 y.o. female who presents for Colonoscopy attempt after failed colonoscopy due to scope length and stenosis.  Past Medical History:  Diagnosis Date  . Arthritis   . Cataract 2010   bilateral eyes  . Chicken pox   . GERD (gastroesophageal reflux disease)   . Glaucoma   . History of frequent urinary tract infections   . Hyperlipidemia   . Osteopenia   . Peripheral neuropathy   . Sleep apnea    wears a CPAP  . Urine incontinence    Past Surgical History:  Procedure Laterality Date  . ABDOMINAL HYSTERECTOMY    . APPENDECTOMY    . BREAST BIOPSY Bilateral 10+ yrs ago   BENIGN  . BUNIONECTOMY Left 1975   Left Toe  . HAMMER TOE SURGERY Right 2005   Right foot correction hammertoe little toe  . NEUROMA SURGERY Right 2003   Right Foot Neuroma, plus Bunionectomy, Right Great Toe  . TONSILLECTOMY AND ADENOIDECTOMY     No current facility-administered medications for this encounter.   Allergies  Allergen Reactions  . Tape Other (See Comments)    Adhesive Tape; pt stated "itch and redness" Adhesive Tape; pt stated "itch and redness"   Family History  Problem Relation Age of Onset  . Hyperlipidemia Mother   . Heart disease Mother   . Hypertension Mother   . Mental illness Mother   . Cancer Father   . Colon cancer Father   . Alcohol abuse Brother   . Drug abuse Brother   . Cancer Brother   . Cancer Maternal Aunt   . Breast cancer Maternal Aunt   . Colon cancer Maternal Grandmother   . Esophageal cancer Neg Hx   . Inflammatory bowel disease Neg Hx   . Liver disease Neg Hx   . Pancreatic cancer Neg Hx   . Rectal cancer Neg Hx   . Stomach cancer Neg Hx    Social History   Socioeconomic History  . Marital status: Widowed    Spouse name: Not on file  . Number of children: 2  . Years of education: college  . Highest education level: Master's degree  (e.g., MA, MS, MEng, MEd, MSW, MBA)  Occupational History  . Occupation: Retired  Tobacco Use  . Smoking status: Never Smoker  . Smokeless tobacco: Never Used  Vaping Use  . Vaping Use: Never used  Substance and Sexual Activity  . Alcohol use: No  . Drug use: No  . Sexual activity: Never  Other Topics Concern  . Not on file  Social History Narrative   Lives alone but her daughter lives next door.   Right-handed.   Occasional caffeine.   Social Determinants of Health   Financial Resource Strain: Not on file  Food Insecurity: Not on file  Transportation Needs: Not on file  Physical Activity: Not on file  Stress: Not on file  Social Connections: Not on file  Intimate Partner Violence: Not on file    Physical Exam: Vital signs in last 24 hours:     GEN: NAD EYE: Sclerae anicteric ENT: MMM CV: Non-tachycardic GI: Soft, NT/ND NEURO:  Alert & Oriented x 3  Lab Results: No results for input(s): WBC, HGB, HCT, PLT in the last 72 hours. BMET No results for input(s): NA, K, CL, CO2, GLUCOSE, BUN, CREATININE, CALCIUM in the last 72 hours. LFT No results for input(s):  PROT, ALBUMIN, AST, ALT, ALKPHOS, BILITOT, BILIDIR, IBILI in the last 72 hours. PT/INR No results for input(s): LABPROT, INR in the last 72 hours.   Impression / Plan: This is a 86 y.o.female who presents for Colonoscopy attempt after failed colonoscopy due to scope length and stenosis.  The risks and benefits of endoscopic evaluation were discussed with the patient; these include but are not limited to the risk of perforation, infection, bleeding, missed lesions, lack of diagnosis, severe illness requiring hospitalization, as well as anesthesia and sedation related illnesses.  The patient is agreeable to proceed.    Corliss Parish, MD [facility] Office # [phone]

## 2021-02-15 NOTE — Op Note (Signed)
[facility] Patient Name: Hailey Perkins Procedure Date: 02/15/2021 MRN: [id number] Attending MD: Justice Britain , MD Date of Birth: 08-Nov-1935 CSN: [id number] Age: 86 Admit Type: Outpatient Procedure:                Colonoscopy Indications:              Screening in patient at increased risk: Family                            history of 1st-degree relative with colorectal                            cancer, Failed Colonoscopy due to colonic                            narrowing/stenosis Providers:                Justice Britain, MD, Jeanella Cara, RN,                            Laverda Sorenson, Technician, Margurite Auerbach Referring MD:             Ermalene Searing. Philip Aspen MD, MD Medicines:                Monitored Anesthesia Care Complications:            No immediate complications. Estimated Blood Loss:     Estimated blood loss was minimal. Estimated blood                            loss was minimal. Procedure:                Pre-Anesthesia Assessment:                           - Prior to the procedure, a History and Physical                            was performed, and patient medications and                            allergies were reviewed. The patient's tolerance of                            previous anesthesia was also reviewed. The risks                            and benefits of the procedure and the sedation                            options and risks were discussed with the patient.                            All questions were answered, and informed consent                            was obtained.  Prior Anticoagulants: The patient has                            taken no previous anticoagulant or antiplatelet                            agents except for NSAID medication. ASA Grade                            Assessment: III - A patient with severe systemic                            disease. After reviewing the risks and benefits,                             the patient was deemed in satisfactory condition to                            undergo the procedure.                           After obtaining informed consent, the colonoscope                            was passed under direct vision. Throughout the                            procedure, the patient's blood pressure, pulse, and                            oxygen saturations were monitored continuously. The                            PCF-PH190L ([id number]) Olympus ultraslim endoscope                            was introduced through the anus and advanced to the                            the cecum, identified by appendiceal orifice and                            ileocecal valve. The colonoscopy was unusually                            difficult due to bowel stenosis. Successful                            completion of the procedure was aided by changing                            the patient's position, using manual pressure,  withdrawing and reinserting the scope,                            straightening and shortening the scope to obtain                            bowel loop reduction and using scope torsion. The                            patient tolerated the procedure. The quality of the                            bowel preparation was good. The ileocecal valve,                            appendiceal orifice, and rectum were photographed. Scope In: 9:07:12 AM Scope Out: 9:36:57 AM Scope Withdrawal Time: 0 hours 16 minutes 24 seconds  Total Procedure Duration: 0 hours 29 minutes 45 seconds  Findings:      The digital rectal exam findings include hemorrhoids. Pertinent       negatives include no palpable rectal lesions.      A benign-appearing, intrinsic severe stenosis measuring 10 cm (in       length) x 1 cm (inner diameter) was found in the recto-sigmoid colon and       in the sigmoid colon and was traversed with significant water immersion       and  gente abdominal pressure and patient repositioning while using the       ultra-slim colonoscope. The stenosis is noted as a result of the       diverticulosis noted below and restricted mobility of the colon in that       region.      Many small-mouthed diverticula were found in the recto-sigmoid colon and       sigmoid colon.      A 3 mm polyp was found in the sigmoid colon. The polyp was sessile. The       polyp was removed with a cold snare. Resection and retrieval were       complete.      Normal mucosa was found in the entire colon otherwise. Biopsies for       histology were taken with a cold forceps from the entire colon for       evaluation of microscopic colitis.      There was some mild erythema in region of the rectosigmoid colon, likely       a result of the scope passing through the region.      Non-bleeding non-thrombosed external and internal hemorrhoids were found       during retroflexion, during perianal exam and during digital exam. The       hemorrhoids were Grade II (internal hemorrhoids that prolapse but reduce       spontaneously). Impression:               - Hemorrhoids found on digital rectal exam.                           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                           - Diverticulosis in the recto-sigmoid colon and in                            the sigmoid colon.                           - One 3 mm polyp in the sigmoid colon, removed with                            a cold snare. Resected and retrieved.                           - Normal mucosa in the entire examined colon                            otherwise. Biopsied.                           - Upon scope withdrawal, noted erythema in the                            rectosigmoid was found likely from scope passage                            through the region.                           - Non-bleeding non-thrombosed external and  internal                            hemorrhoids. Moderate Sedation:      Not Applicable - Patient had care per Anesthesia. Recommendation:           - The patient will be observed post-procedure,                            until all discharge criteria are met.                           - Discharge patient to home.                           - Patient has a contact number available for                            emergencies. The signs and symptoms of potential                            delayed complications were discussed with the                            patient. Return to normal activities tomorrow.  Written discharge instructions were provided to the                            patient.                           - High fiber diet.                           - Use FiberCon daily.                           - Continue present medications.                           - Await pathology results.                           - Repeat colonoscopy is not recommended based on                            age.                           - If patient develops recurrent diverticulitis in                            the future, will need to consider surgical referral.                           - The findings and recommendations were discussed                            with the patient.                           - The findings and recommendations were discussed                            with the patient's family. Procedure Code(s):        --- Professional ---                           [phone], Colonoscopy, flexible; with removal of                            tumor(s), polyp(s), or other lesion(s) by snare                            technique                           56389, 27, Colonoscopy, flexible; with biopsy,                            single or multiple Diagnosis Code(s):        --- Professional ---  Z80.0, Family history of malignant neoplasm of                             digestive organs                           K64.1, Second degree hemorrhoids                           K56.699, Other intestinal obstruction unspecified                            as to partial versus complete obstruction                           K63.5, Polyp of colon                           K57.30, Diverticulosis of large intestine without                            perforation or abscess without bleeding CPT copyright 2019 [employer]. All rights reserved. The codes documented in this report are preliminary and upon coder review may  be revised to meet current compliance requirements. Justice Britain, MD 02/15/2021 10:00:16 AM Number of Addenda: 0

## 2021-02-15 NOTE — Anesthesia Postprocedure Evaluation (Signed)
Anesthesia Post Note  Patient: Hailey Perkins  Procedure(s) Performed: COLONOSCOPY WITH PROPOFOL (N/A ) BIOPSY POLYPECTOMY     Patient location during evaluation: Endoscopy Anesthesia Type: MAC Level of consciousness: awake and alert Pain management: pain level controlled Vital Signs Assessment: post-procedure vital signs reviewed and stable Respiratory status: spontaneous breathing, nonlabored ventilation and respiratory function stable Cardiovascular status: stable and blood pressure returned to baseline Postop Assessment: no apparent nausea or vomiting Anesthetic complications: no   No complications documented.  Last Vitals:  Vitals:   02/15/21 1010 02/15/21 1020  BP: (!) 160/94 (!) 160/94  Pulse: 70 70  Resp: 16 12  Temp:    SpO2: 98% 99%    Last Pain:  Vitals:   02/15/21 1020  TempSrc:   PainSc: 0-No pain                 Hailey Perkins,W. EDMOND

## 2021-02-15 NOTE — Discharge Instructions (Signed)
YOU HAD AN ENDOSCOPIC PROCEDURE TODAY: Refer to the procedure report and other information in the discharge instructions given to you for any specific questions about what was found during the examination. If this information does not answer your questions, please call [place] office at [phone] to clarify.  ° °YOU SHOULD EXPECT: Some feelings of bloating in the abdomen. Passage of more gas than usual. Walking can help get rid of the air that was put into your GI tract during the procedure and reduce the bloating. If you had a lower endoscopy (such as a colonoscopy or flexible sigmoidoscopy) you may notice spotting of blood in your stool or on the toilet paper. Some abdominal soreness may be present for a day or two, also. ° °DIET: Your first meal following the procedure should be a light meal and then it is ok to progress to your normal diet. A half-sandwich or bowl of soup is an example of a good first meal. Heavy or fried foods are harder to digest and may make you feel nauseous or bloated. Drink plenty of fluids but you should avoid alcoholic beverages for 24 hours. If you had a esophageal dilation, please see attached instructions for diet.   ° °ACTIVITY: Your care partner should take you home directly after the procedure. You should plan to take it easy, moving slowly for the rest of the day. You can resume normal activity the day after the procedure however YOU SHOULD NOT DRIVE, use power tools, machinery or perform tasks that involve climbing or major physical exertion for 24 hours (because of the sedation medicines used during the test).  ° °SYMPTOMS TO REPORT IMMEDIATELY: °A gastroenterologist can be reached at any hour. Please call [phone]  for any of the following symptoms:  °Following lower endoscopy (colonoscopy, flexible sigmoidoscopy) °Excessive amounts of blood in the stool  °Significant tenderness, worsening of abdominal pains  °Swelling of the abdomen that is new, acute  °Fever of 100° or  higher  ° °Black, tarry-looking or red, bloody stools ° °FOLLOW UP:  °If any biopsies were taken you will be contacted by phone or by letter within the next 1-3 weeks. Call [phone]  if you have not heard about the biopsies in 3 weeks.  °Please also call with any specific questions about appointments or follow up tests.  °

## 2021-02-16 ENCOUNTER — Encounter: Payer: Self-pay | Admitting: Gastroenterology

## 2021-02-16 ENCOUNTER — Encounter (HOSPITAL_COMMUNITY): Payer: Self-pay | Admitting: Gastroenterology

## 2021-02-16 LAB — SURGICAL PATHOLOGY

## 2021-02-17 ENCOUNTER — Ambulatory Visit
Admission: RE | Admit: 2021-02-17 | Discharge: 2021-02-17 | Disposition: A | Discharge status: Home / Self Care | Payer: Medicare Other | Source: Ambulatory Visit | Attending: Internal Medicine | Admitting: Internal Medicine

## 2021-02-17 ENCOUNTER — Other Ambulatory Visit: Payer: Self-pay

## 2021-02-17 DIAGNOSIS — M21371 Foot drop, right foot: Secondary | ICD-10-CM

## 2021-02-17 DIAGNOSIS — M21372 Foot drop, left foot: Secondary | ICD-10-CM

## 2021-02-17 IMAGING — MR MR LUMBAR SPINE W/O CM
4 of 5 series · 28 of 48 positions shown · non-contrast
Comparison: None.

CLINICAL DATA: Bilateral foot drop right greater than left.

EXAM:
MRI LUMBAR SPINE WITHOUT CONTRAST
TECHNIQUE: Multiplanar, multisequence MR imaging of the lumbar spine was
performed. No intravenous contrast was administered.

[Series 3: T2 · sagittal · 4.0mm · 1.09mm/px · 6 of 16 slices shown (1 of 2)]
[im 1/16]
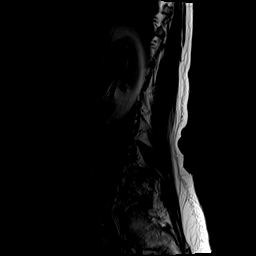
[im 4/16]
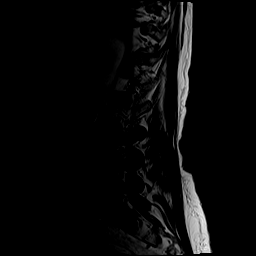
[im 7/16]
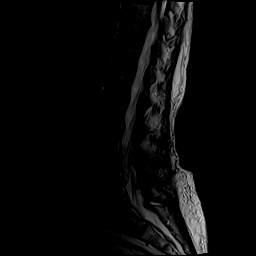
[im 10/16]
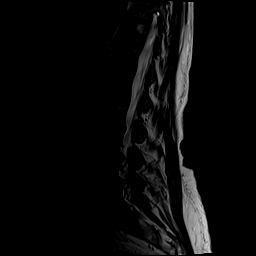
[im 13/16]
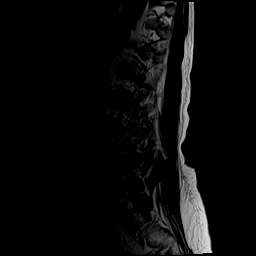
[im 16/16]
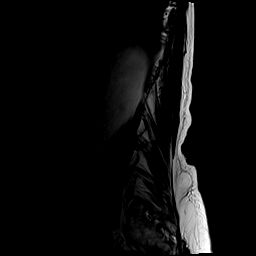

[Series 5: T1 · sagittal · 4.0mm · 1.09mm/px · 7 of 16 slices shown (1 of 2)]
[im 1/16]
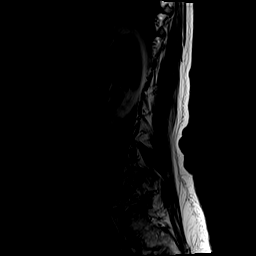
[im 3/16]
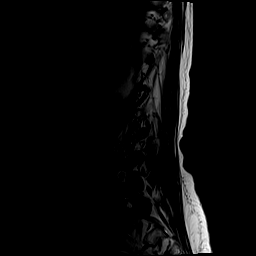
[im 6/16]
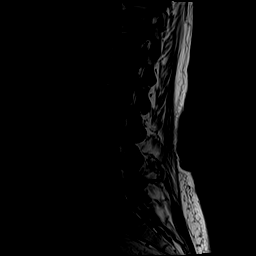
[im 8/16]
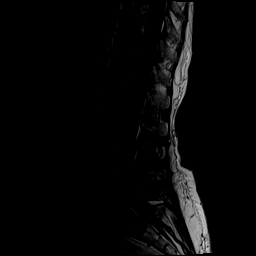
[im 11/16]
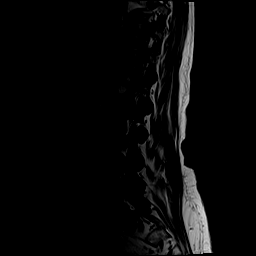
[im 13/16]
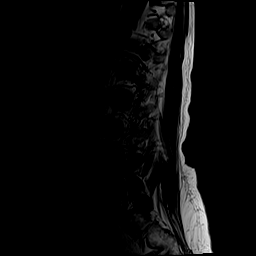
[im 16/16]
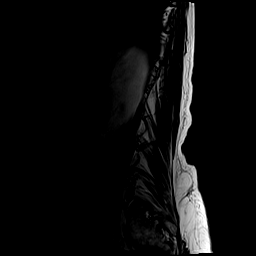

[Series 6: T2 · axial · 4.0mm · 0.39mm/px · z∈[-48,+174]mm · 8 of 35 slices shown (2 of 2)]
[im 1/35]
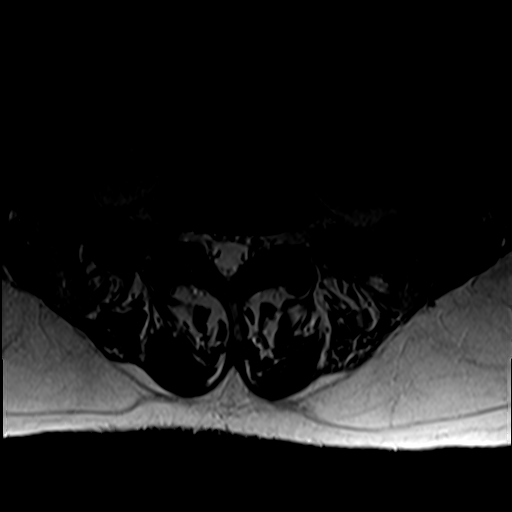
[im 6/35]
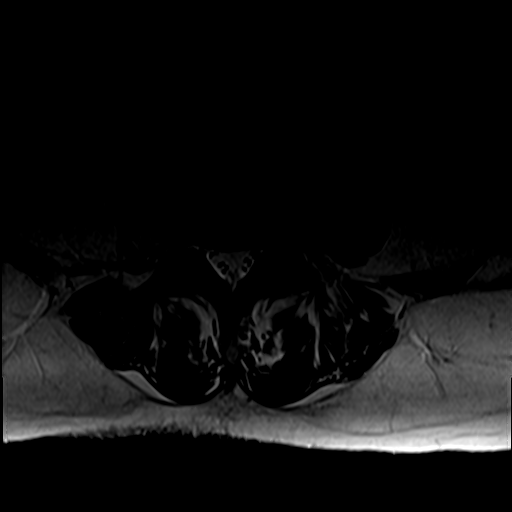
[im 11/35]
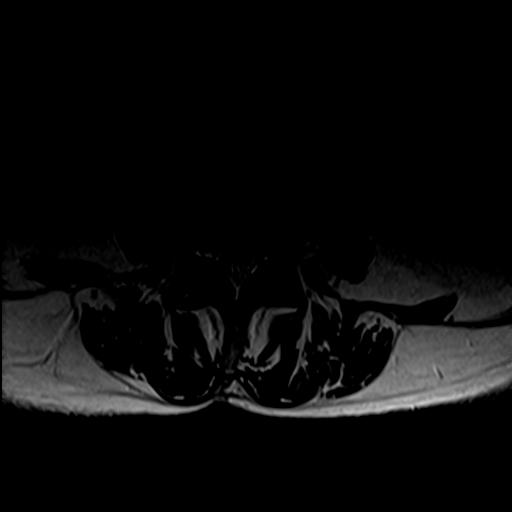
[im 16/35]
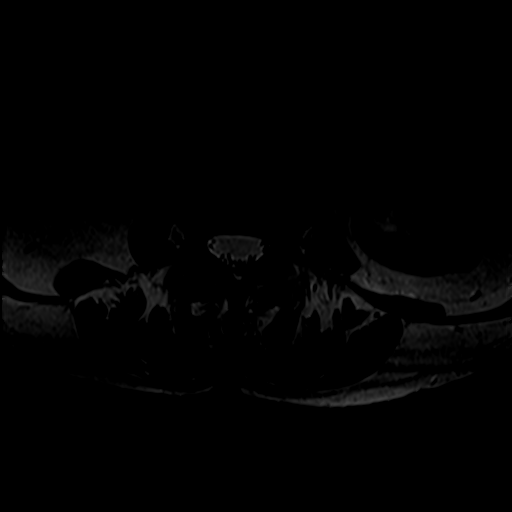
[im 19/35]
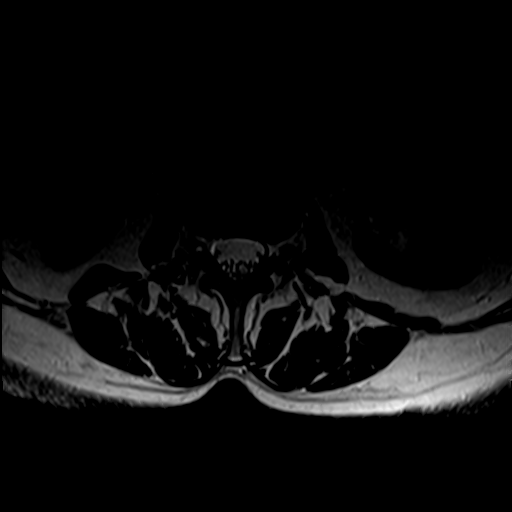
[im 24/35]
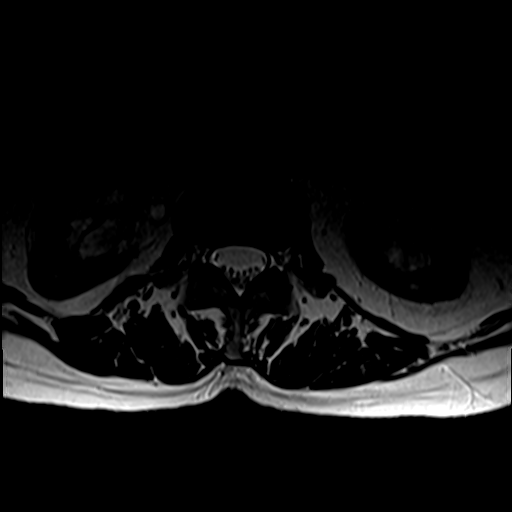
[im 29/35]
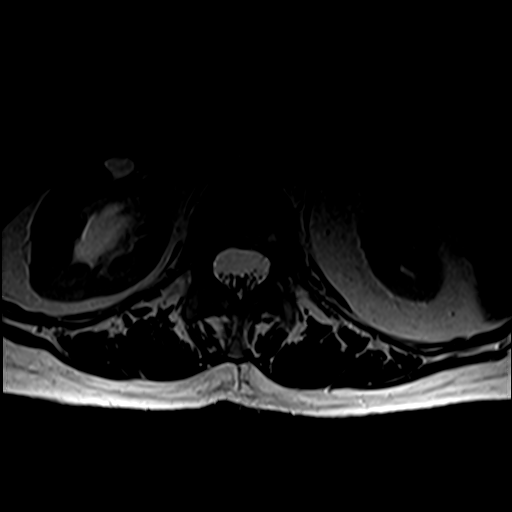
[im 35/35]
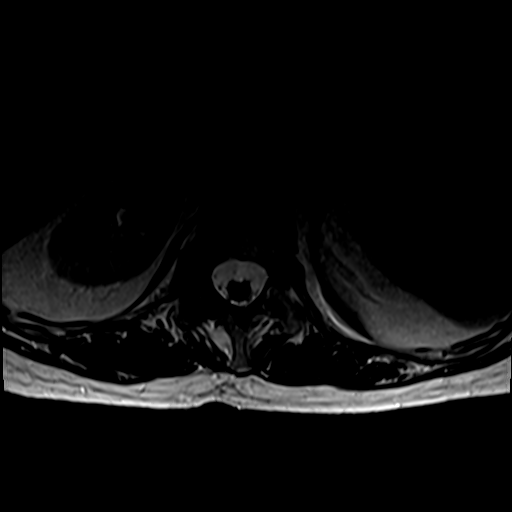

[Series 7: T1 · axial · 4.0mm · 0.39mm/px · z∈[-48,+146]mm · 7 of 35 slices shown (2 of 2)]
[im 1/35]
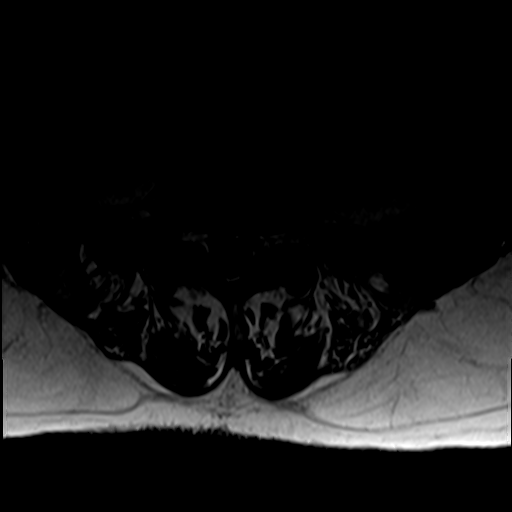
[im 6/35]
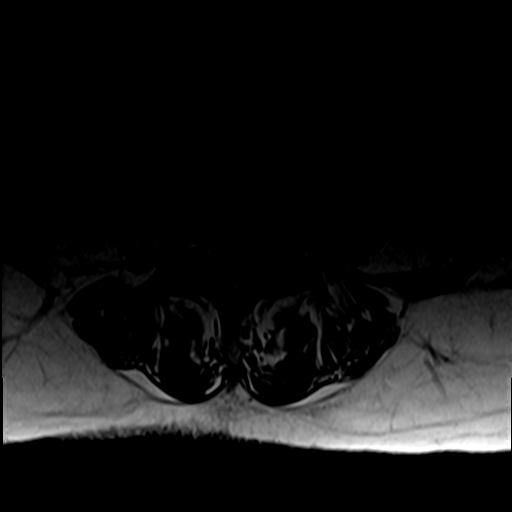
[im 11/35]
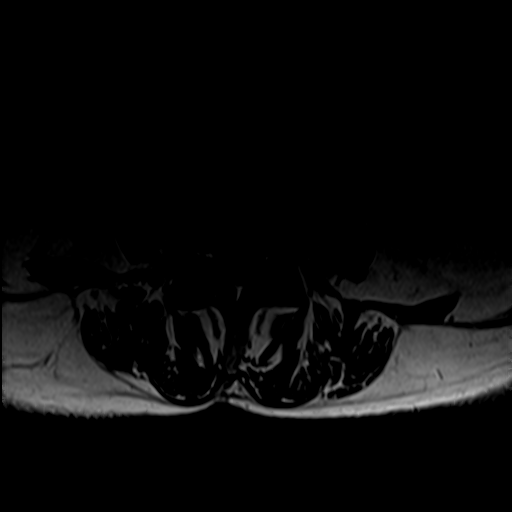
[im 16/35]
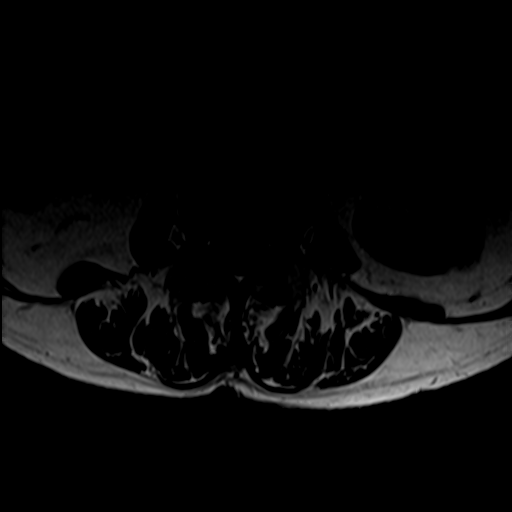
[im 19/35]
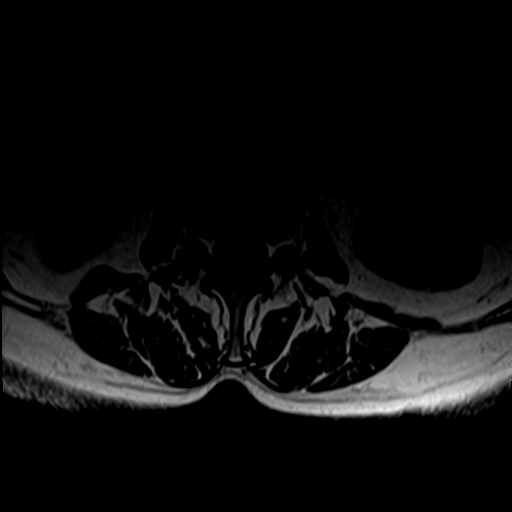
[im 24/35]
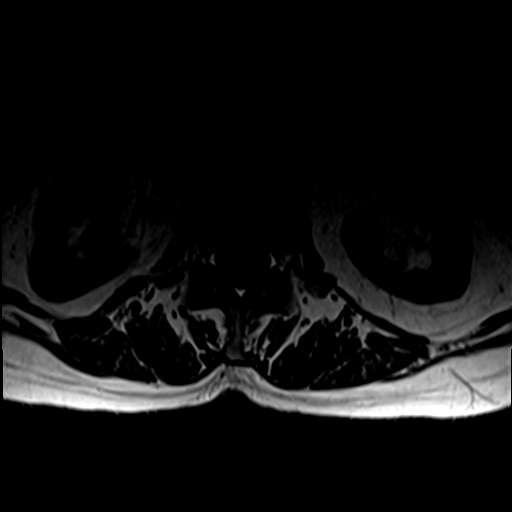
[im 29/35]
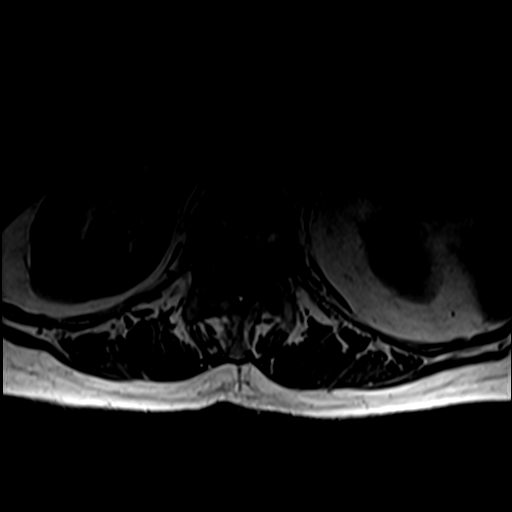

[28 of 48 positions shown; findings below may reference images not displayed]

FINDINGS: Segmentation:  Normal

Alignment:  Normal

Vertebrae: Normal bone marrow. Negative for fracture or mass.
Hemangioma T11 vertebral body and L1 vertebral body.

Conus medullaris and cauda equina: Conus extends to the L1-2 level.
Conus and cauda equina appear normal.

Paraspinal and other soft tissues: Bilateral parapelvic renal cyst.
No paraspinous mass or adenopathy.

Disc levels:

T12-L1: Negative

L1-2: Mild degenerative change.  Negative for stenosis

L2-3: Mild disc bulging with small Schmorl's node. Early facet
degeneration. Negative for stenosis

L3-4: Disc degeneration with small Schmorl's node. Moderate disc
bulging asymmetric to the left. Moderate facet hypertrophy
bilaterally. Mild spinal stenosis. Moderate left subarticular
stenosis and mild right subarticular stenosis

L4-5: Moderate disc degeneration with diffuse bulging of the disc
and small central disc protrusion. Diffuse endplate spurring.
Bilateral facet hypertrophy. Subarticular stenosis bilaterally left
greater than right. Spinal canal adequate in size

L5-S1: Mild disc bulging and mild facet degeneration. Negative for
stenosis.
IMPRESSION: Disc and facet degeneration throughout the lumbar spine. Spinal
canal is patent throughout the lumbar spine.

Mild spinal stenosis L3-4 with moderate left subarticular stenosis
and mild right subarticular stenosis.

Subarticular stenosis bilaterally left greater than right at L4-5.

## 2021-02-24 ENCOUNTER — Other Ambulatory Visit: Payer: Self-pay | Admitting: Nurse Practitioner

## 2021-02-24 DIAGNOSIS — M8580 Other specified disorders of bone density and structure, unspecified site: Secondary | ICD-10-CM

## 2021-03-19 ENCOUNTER — Other Ambulatory Visit: Payer: Self-pay

## 2021-03-19 ENCOUNTER — Ambulatory Visit
Admission: RE | Admit: 2021-03-19 | Discharge: 2021-03-19 | Disposition: A | Discharge status: Home / Self Care | Payer: Medicare Other | Source: Ambulatory Visit | Attending: Nurse Practitioner | Admitting: Nurse Practitioner

## 2021-03-19 DIAGNOSIS — M8580 Other specified disorders of bone density and structure, unspecified site: Secondary | ICD-10-CM
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
## 2021-04-13 ENCOUNTER — Other Ambulatory Visit: Payer: Self-pay

## 2021-04-13 ENCOUNTER — Encounter: Payer: Self-pay | Admitting: Family Medicine

## 2021-04-13 ENCOUNTER — Ambulatory Visit: Payer: Medicare Other | Admitting: Family Medicine

## 2021-04-13 VITALS — BP 151/89 | HR 87 | Ht 63.5 in | Wt 166.0 lb

## 2021-04-13 DIAGNOSIS — Z9989 Dependence on other enabling machines and devices: Secondary | ICD-10-CM

## 2021-04-13 DIAGNOSIS — R200 Anesthesia of skin: Secondary | ICD-10-CM | POA: Diagnosis not present

## 2021-04-13 DIAGNOSIS — G4733 Obstructive sleep apnea (adult) (pediatric): Secondary | ICD-10-CM | POA: Diagnosis not present

## 2021-04-13 NOTE — Patient Instructions (Addendum)
Please continue using your CPAP regularly. While your insurance requires that you use CPAP at least 4 hours each night on 70% of the nights, I recommend, that you not skip any nights and use it throughout the night if you can. Getting used to CPAP and staying with the treatment long term does take time and patience and discipline. Untreated obstructive sleep apnea when it is moderate to severe can have an adverse impact on cardiovascular health and raise her risk for heart disease, arrhythmias, hypertension, congestive heart failure, stroke and diabetes. Untreated obstructive sleep apnea causes sleep disruption, nonrestorative sleep, and sleep deprivation. This can have an impact on your day to day functioning and cause daytime sleepiness and impairment of cognitive function, memory loss, mood disturbance, and problems focussing. Using CPAP regularly can improve these symptoms.   Continue CPAP daily. Work on Physiological scientist. Continue regular exercise. Continue follow up with PCP. Consider PT for back pain. ESI injections could be helpful. We will follow up with Dr Terrace Arabia per your request in 05/2021.     Radicular Pain Radicular pain is a type of pain that spreads from your back or neck along a spinal nerve. Spinal nerves are nerves that leave the spinal cord and go to the muscles. Radicular pain is sometimes called radiculopathy, radiculitis, or a pinched nerve. When you have this type of pain, you may also have weakness, numbness, or tingling in the area of your body that is supplied by the nerve. The pain may feel sharp and burning. Depending on which spinal nerve is affected, the pain may occur in the:  Neck area (cervical radicular pain). You may also feel pain, numbness, weakness, or tingling in the arms.  Mid-spine area (thoracic radicular pain). You would feel this pain in the back and chest. This type is rare.  Lower back area (lumbar radicular pain). You would feel this pain as low back pain. You  may feel pain, numbness, weakness, or tingling in the buttocks or legs. Sciatica is a type of lumbar radicular pain that shoots down the back of the leg. Radicular pain occurs when one of the spinal nerves becomes irritated or squeezed (compressed). It is often caused by something pushing on a spinal nerve, such as one of the bones of the spine (vertebrae) or one of the round cushions between vertebrae (intervertebral disks). This can result from:  An injury.  Wear and tear or aging of a disk.  The growth of a bone spur that pushes on the nerve. Radicular pain often goes away when you follow instructions from your health care provider for relieving pain at home. Follow these instructions at home: Managing pain  If directed, put ice on the affected area: ? Put ice in a plastic bag. ? Place a towel between your skin and the bag. ? Leave the ice on for 20 minutes, 2-3 times a day.  If directed, apply heat to the affected area as often as told by your health care provider. Use the heat source that your health care provider recommends, such as a moist heat pack or a heating pad. ? Place a towel between your skin and the heat source. ? Leave the heat on for 20-30 minutes. ? Remove the heat if your skin turns bright red. This is especially important if you are unable to feel pain, heat, or cold. You may have a greater risk of getting burned.      Activity  Do not sit or rest in bed for  long periods of time.  Try to stay as active as possible. Ask your health care provider what type of exercise or activity is best for you.  Avoid activities that make your pain worse, such as bending and lifting.  Do not lift anything that is heavier than 10 lb (4.5 kg), or the limit that you are told, until your health care provider says that it is safe.  Practice using proper technique when lifting items. Proper lifting technique involves bending your knees and rising up.  Do strength and range-of-motion  exercises only as told by your health care provider or physical therapist.   General instructions  Take over-the-counter and prescription medicines only as told by your health care provider.  Pay attention to any changes in your symptoms.  Keep all follow-up visits as told by your health care provider. This is important. ? Your health care provider may send you to a physical therapist to help with this pain. Contact a health care provider if:  Your pain and other symptoms get worse.  Your pain medicine is not helping.  Your pain has not improved after a few weeks of home care.  You have a fever. Get help right away if:  You have severe pain, weakness, or numbness.  You have difficulty with bladder or bowel control. Summary  Radicular pain is a type of pain that spreads from your back or neck along a spinal nerve.  When you have radicular pain, you may also have weakness, numbness, or tingling in the area of your body that is supplied by the nerve.  The pain may feel sharp or burning.  Radicular pain may be treated with ice, heat, medicines, or physical therapy. This information is not intended to replace advice given to you by your health care provider. Make sure you discuss any questions you have with your health care provider. Document Revised: 06/05/2018 Document Reviewed: 06/05/2018 Elsevier Patient Education  2021 [employer]   Quality Sleep Information, Adult Quality sleep is important for your mental and physical health. It also improves your quality of life. Quality sleep means you:  Are asleep for most of the time you are in bed.  Fall asleep within 30 minutes.  Wake up no more than once a night.  Are awake for no longer than 20 minutes if you do wake up during the night. Most adults need 7-8 hours of quality sleep each night. How can poor sleep affect me? If you do not get enough quality sleep, you may have:  Mood swings.  Daytime  sleepiness.  Confusion.  Decreased reaction time.  Sleep disorders, such as insomnia and sleep apnea.  Difficulty with: ? Solving problems. ? Coping with stress. ? Paying attention. These issues may affect your performance and productivity at work, school, and at home. Lack of sleep may also put you at higher risk for accidents, suicide, and risky behaviors. If you do not get quality sleep you may also be at higher risk for several health problems, including:  Infections.  Type 2 diabetes.  Heart disease.  High blood pressure.  Obesity.  Worsening of long-term conditions, like arthritis, kidney disease, depression, Parkinson's disease, and epilepsy. What actions can I take to get more quality sleep?  Stick to a sleep schedule. Go to sleep and wake up at about the same time each day. Do not try to sleep less on weekdays and make up for lost sleep on weekends. This does not work.  Try to get about  30 minutes of exercise on most days. Do not exercise 2-3 hours before going to bed.  Limit naps during the day to 30 minutes or less.  Do not use any products that contain nicotine or tobacco, such as cigarettes or e-cigarettes. If you need help quitting, ask your health care provider.  Do not drink caffeinated beverages for at least 8 hours before going to bed. Coffee, tea, and some sodas contain caffeine.  Do not drink alcohol close to bedtime.  Do not eat large meals close to bedtime.  Do not take naps in the late afternoon.  Try to get at least 30 minutes of sunlight every day. Morning sunlight is best.  Make time to relax before bed. Reading, listening to music, or taking a hot bath promotes quality sleep.  Make your bedroom a place that promotes quality sleep. Keep your bedroom dark, quiet, and at a comfortable room temperature. Make sure your bed is comfortable. Take out sleep distractions like TV, a computer, smartphone, and bright lights.  If you are lying awake in  bed for longer than 20 minutes, get up and do a relaxing activity until you feel sleepy.  Work with your health care provider to treat medical conditions that may affect sleeping, such as: ? Nasal obstruction. ? Snoring. ? Sleep apnea and other sleep disorders.  Talk to your health care provider if you think any of your prescription medicines may cause you to have difficulty falling or staying asleep.  If you have sleep problems, talk with a sleep consultant. If you think you have a sleep disorder, talk with your health care provider about getting evaluated by a specialist.      Where to find more information  National Sleep Foundation website: https://sleepfoundation.org  National Heart, Lung, and Blood Institute (NHLBI): https://hall.info/www.nhlbi.nih.gov/files/docs/public/sleep/healthy_sleep.pdf  Centers for Disease Control and Prevention (CDC): DetailSports.iswww.cdc.gov/sleep/index.html Contact a health care provider if you:  Have trouble getting to sleep or staying asleep.  Often wake up very early in the morning and cannot get back to sleep.  Have daytime sleepiness.  Have daytime sleep attacks of suddenly falling asleep and sudden muscle weakness (narcolepsy).  Have a tingling sensation in your legs with a strong urge to move your legs (restless legs syndrome).  Stop breathing briefly during sleep (sleep apnea).  Think you have a sleep disorder or are taking a medicine that is affecting your quality of sleep. Summary  Most adults need 7-8 hours of quality sleep each night.  Getting enough quality sleep is an important part of health and well-being.  Make your bedroom a place that promotes quality sleep and avoid things that may cause you to have poor sleep, such as alcohol, caffeine, smoking, and large meals.  Talk to your health care provider if you have trouble falling asleep or staying asleep. This information is not intended to replace advice given to you by your health care provider. Make  sure you discuss any questions you have with your health care provider. Document Revised: 02/28/2018 Document Reviewed: 02/28/2018 Elsevier Patient Education  2021 [employer]   Sleep Apnea Sleep apnea affects breathing during sleep. It causes breathing to stop for a short time or to become shallow. It can also increase the risk of:  Heart attack.  Stroke.  Being very overweight (obese).  Diabetes.  Heart failure.  Irregular heartbeat. The goal of treatment is to help you breathe normally again. What are the causes? There are three kinds of sleep apnea:  Obstructive sleep  apnea. This is caused by a blocked or collapsed airway.  Central sleep apnea. This happens when the brain does not send the right signals to the muscles that control breathing.  Mixed sleep apnea. This is a combination of obstructive and central sleep apnea. The most common cause of this condition is a collapsed or blocked airway. This can happen if:  Your throat muscles are too relaxed.  Your tongue and tonsils are too large.  You are overweight.  Your airway is too small.   What increases the risk?  Being overweight.  Smoking.  Having a small airway.  Being older.  Being female.  Drinking alcohol.  Taking medicines to calm yourself (sedatives or tranquilizers).  Having family members with the condition. What are the signs or symptoms?  Trouble staying asleep.  Being sleepy or tired during the day.  Getting angry a lot.  Loud snoring.  Headaches in the morning.  Not being able to focus your mind (concentrate).  Forgetting things.  Less interest in sex.  Mood swings.  Personality changes.  Feelings of sadness (depression).  Waking up a lot during the night to pee (urinate).  Dry mouth.  Sore throat. How is this diagnosed?  Your medical history.  A physical exam.  A test that is done when you are sleeping (sleep study). The test is most often done in a sleep  lab but may also be done at home. How is this treated?  Sleeping on your side.  Using a medicine to get rid of mucus in your nose (decongestant).  Avoiding the use of alcohol, medicines to help you relax, or certain pain medicines (narcotics).  Losing weight, if needed.  Changing your diet.  Not smoking.  Using a machine to open your airway while you sleep, such as: ? An oral appliance. This is a mouthpiece that shifts your lower jaw forward. ? A CPAP device. This device blows air through a mask when you breathe out (exhale). ? An EPAP device. This has valves that you put in each nostril. ? A BPAP device. This device blows air through a mask when you breathe in (inhale) and breathe out.  Having surgery if other treatments do not work. It is important to get treatment for sleep apnea. Without treatment, it can lead to:  High blood pressure.  Coronary artery disease.  In men, not being able to have an erection (impotence).  Reduced thinking ability.   Follow these instructions at home: Lifestyle  Make changes that your doctor recommends.  Eat a healthy diet.  Lose weight if needed.  Avoid alcohol, medicines to help you relax, and some pain medicines.  Do not use any products that contain nicotine or tobacco, such as cigarettes, e-cigarettes, and chewing tobacco. If you need help quitting, ask your doctor. General instructions  Take over-the-counter and prescription medicines only as told by your doctor.  If you were given a machine to use while you sleep, use it only as told by your doctor.  If you are having surgery, make sure to tell your doctor you have sleep apnea. You may need to bring your device with you.  Keep all follow-up visits as told by your doctor. This is important. Contact a doctor if:  The machine that you were given to use during sleep bothers you or does not seem to be working.  You do not get better.  You get worse. Get help right away  if:  Your chest hurts.  You have  trouble breathing in enough air.  You have an uncomfortable feeling in your back, arms, or stomach.  You have trouble talking.  One side of your body feels weak.  A part of your face is hanging down. These symptoms may be an emergency. Do not wait to see if the symptoms will go away. Get medical help right away. Call your local emergency services (911 in the [place] Do not drive yourself to the hospital. Summary  This condition affects breathing during sleep.  The most common cause is a collapsed or blocked airway.  The goal of treatment is to help you breathe normally while you sleep. This information is not intended to replace advice given to you by your health care provider. Make sure you discuss any questions you have with your health care provider. Document Revised: 09/07/2018 Document Reviewed: 07/17/2018 Elsevier Patient Education  2021 [employer].

## 2021-05-20 ENCOUNTER — Encounter: Payer: Self-pay | Admitting: Family Medicine

## 2021-05-27 ENCOUNTER — Other Ambulatory Visit: Payer: Self-pay

## 2021-05-27 ENCOUNTER — Ambulatory Visit: Payer: Medicare Other | Admitting: Neurology

## 2021-05-27 VITALS — BP 144/86 | HR 82 | Ht 63.5 in | Wt 165.3 lb

## 2021-05-27 DIAGNOSIS — Z9989 Dependence on other enabling machines and devices: Secondary | ICD-10-CM

## 2021-05-27 DIAGNOSIS — M25561 Pain in right knee: Secondary | ICD-10-CM | POA: Diagnosis not present

## 2021-05-27 DIAGNOSIS — G8929 Other chronic pain: Secondary | ICD-10-CM | POA: Insufficient documentation

## 2021-05-27 DIAGNOSIS — R200 Anesthesia of skin: Secondary | ICD-10-CM | POA: Diagnosis not present

## 2021-05-27 DIAGNOSIS — G4733 Obstructive sleep apnea (adult) (pediatric): Secondary | ICD-10-CM | POA: Diagnosis not present

## 2021-05-27 DIAGNOSIS — R269 Unspecified abnormalities of gait and mobility: Secondary | ICD-10-CM
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
## 2021-05-31 ENCOUNTER — Encounter: Payer: Self-pay | Admitting: Neurology

## 2021-05-31 ENCOUNTER — Telehealth: Payer: Self-pay | Admitting: Neurology

## 2021-05-31 NOTE — Telephone Encounter (Signed)
I called [facility] @ [phone] and spoke w/Stephanie to discuss how referral needs to be sent. Patient is already seen by Dr. Yevette Edwards. Judeth Cornfield states patient just needs to call to make an appointment, referral does not need to be faxed. I called patient to relay this information. Patient states that she has an appointment next door with neuro rehab tomorrow. She believes that is what the referral was for. I advised that the referral placed says orthopaedics, but told her that if she would like an appointment at [facility]/Dr. Yevette Edwards, all she needs to do is call. Advised pt to otherwise keep neuro rehab appt.

## 2021-06-01 ENCOUNTER — Ambulatory Visit: Payer: Medicare Other | Attending: Neurology | Admitting: Physical Therapy

## 2021-06-01 ENCOUNTER — Other Ambulatory Visit: Payer: Self-pay

## 2021-06-01 DIAGNOSIS — R2689 Other abnormalities of gait and mobility: Secondary | ICD-10-CM

## 2021-06-01 DIAGNOSIS — M6281 Muscle weakness (generalized): Secondary | ICD-10-CM | POA: Diagnosis present

## 2021-06-01 DIAGNOSIS — R2681 Unsteadiness on feet: Secondary | ICD-10-CM

## 2021-06-01 NOTE — Patient Instructions (Signed)
Gastroc / Heel Cord Stretch - On Step    Stand with heels over edge of stair. Holding rail, lower heels until stretch is felt in calf of legs. Repeat _1-2__ times, 1-2x/day; hold 30 secs    Heel Cord Stretch    Place one leg forward, bent, other leg behind and straight. Lean forward keeping back heel flat. Hold _30___ seconds while counting out loud. Repeat with other leg. Repeat __1__ times. Do __1-2__ sessions per day.  http://gt2.exer.us/512   Copyright  VHI. All rights reserved.    Heel Raise: Unilateral (Standing)    Balance on left foot, then rise on ball of foot. Repeat __10__ times per set. Do __1__ sets per session. Do _1-2___ sessions per day.  http://orth.exer.us/41      Heel Raise: Bilateral (Standing)    Rise on balls of feet. Repeat __10__ times per set. Do __3__ sets per session. Do _1-2___ sessions per day.  http://orth.exer.us/39   Copyright  VHI. All rights reserved.

## 2021-06-02 ENCOUNTER — Encounter: Payer: Self-pay | Admitting: Physical Therapy

## 2021-06-02 NOTE — Therapy (Signed)
[facility] [address] [place], [place], [address] Phone: [phone]   Fax:  [phone]  Physical Therapy Evaluation  Patient Details  Name: Hailey Perkins MRN: [id number] Date of Birth: September 20, 1935 Referring Provider (PT): Dr. Levert Feinstein   Encounter Date: 06/01/2021   PT End of Session - 06/02/21 1140     Visit Number 1    Number of Visits 7   eval + 6 visits   Date for PT Re-Evaluation 07/16/21    Authorization Type [employer] Medicare    Authorization Time Period 06-01-21 - 07-16-21    PT Start Time 1018    PT Stop Time 1101    PT Time Calculation (min) 43 min    Activity Tolerance Patient tolerated treatment well    Behavior During Therapy [facility] for tasks assessed/performed             Past Medical History:  Diagnosis Date   Arthritis    Cataract 2010   bilateral eyes   Chicken pox    GERD (gastroesophageal reflux disease)    Glaucoma    History of frequent urinary tract infections    Hyperlipidemia    Osteopenia    Peripheral neuropathy    Sleep apnea    wears a CPAP   Urine incontinence     Past Surgical History:  Procedure Laterality Date   ABDOMINAL HYSTERECTOMY     APPENDECTOMY     BIOPSY  02/15/2021   Procedure: BIOPSY;  Surgeon: Lemar Lofty., MD;  Location: [facility];  Service: Gastroenterology;;   BREAST BIOPSY Bilateral 10+ yrs ago   BENIGN   BUNIONECTOMY Left 1975   Left Toe   COLONOSCOPY WITH PROPOFOL N/A 02/15/2021   Procedure: COLONOSCOPY WITH PROPOFOL;  Surgeon: Lemar Lofty., MD;  Location: [facility];  Service: Gastroenterology;  Laterality: N/A;  ultraslimscope    HAMMER TOE SURGERY Right 2005   Right foot correction hammertoe little toe   NEUROMA SURGERY Right 2003   Right Foot Neuroma, plus Bunionectomy, Right Great Toe   POLYPECTOMY  02/15/2021   Procedure: POLYPECTOMY;  Surgeon: Mansouraty, Netty Starring., MD;  Location: [facility];  Service:  Gastroenterology;;   TONSILLECTOMY AND ADENOIDECTOMY      There were no vitals filed for this visit.    Subjective Assessment - 06/01/21 1027     Subjective Pt reports she started having bil. foot drop (worse on RLE) approx. 6 months ago.  Had lumbar MRI in March 2022 - revealed some mild bulging discs and stenosis.  Reports no falls in past 6 months.  Does not use SPC in home - and also did not use at [facility] this am when she did her walk (walked for 30"); pt states she also does aquatic exercise class at [facility].    Pertinent History peripheral neuropathy; osteopenia; cervical disc disease with radiculopathy; arthritis    Diagnostic tests MRI - was done in March 2022    Patient Stated Goals work on strengthening ankles    Currently in Pain? Yes    Pain Score 0-No pain   no pain in Rt knee at rest - only occurs when she goes to move   Pain Location Knee    Pain Orientation Right    Pain Descriptors / Indicators Sharp    Pain Type Chronic pain    Pain Onset More than a month ago   approx. 6 months ago   Pain Frequency Intermittent    Aggravating Factors  sit to stand after having been sitting for prolonged time; or go to move    Pain Relieving Factors swinging Rt leg several times in standing relieves the pain                [facility] PT Assessment - 06/02/21 0001       Assessment   Medical Diagnosis Gait abnormality:  Bil. Foot Drop; Chronic pain Rt knee    Referring Provider (PT) Dr. Levert Feinstein    Onset Date/Surgical Date --   approx. Jan 2022     Precautions   Precautions Fall      Balance Screen   Has the patient fallen in the past 6 months No    Has the patient had a decrease in activity level because of a fear of falling?  No    Is the patient reluctant to leave their home because of a fear of falling?  No      Prior Function   Level of Independence Independent      ROM / Strength   AROM / PROM / Strength Strength      Strength   Overall Strength Deficits    Strength  Assessment Site Hip;Knee;Ankle    Right/Left Hip Right;Left    Right Hip Flexion 3+/5    Left Hip Flexion 4/5    Right/Left Knee Right;Left    Right Knee Flexion 4/5    Right Knee Extension 5/5    Left Knee Flexion 4/5    Left Knee Extension 5/5    Right/Left Ankle Right;Left    Right Ankle Dorsiflexion 3-/5    Right Ankle Plantar Flexion 3-/5    Left Ankle Dorsiflexion 3-/5    Left Ankle Plantar Flexion 3-/5      Transfers   Transfers Sit to Stand    Comments able to transfer sit to stand from chair without UE support with use of momentum; used back of legs for stabilization against mat table for sit to stand from this surface      Ambulation/Gait   Ambulation/Gait Yes    Ambulation/Gait Assistance 6: Modified independent (Device/Increase time)    Ambulation Distance (Feet) 50 Feet    Assistive device None   has SPC but did not use during testing   Gait Pattern Decreased dorsiflexion - right;Decreased dorsiflexion - left    Ambulation Surface Level;Indoor    Gait velocity 15.94 secs = 2.06 ft/sec without SPC      Standardized Balance Assessment   Standardized Balance Assessment Timed Up and Go Test      Timed Up and Go Test   Normal TUG (seconds) 13.63                        Objective measurements completed on examination: See above findings.               PT Education - 06/02/21 1138     Education Details HEP - hamstring and heel cord stretches; bilateral heel raise for strengthening    Person(s) Educated Patient    Methods Explanation;Demonstration;Handout    Comprehension Verbalized understanding;Returned demonstration              PT Short Term Goals - 06/02/21 1158       PT SHORT TERM GOAL #1   Title Trial foot up brace and AFO's for increased safety with gait due to bil. foot drop.    Time 3    Period Weeks  Status New    Target Date 06/25/21      PT SHORT TERM GOAL #2   Title Initiate HEP for bil. ankle strengthening  and heel cord stretching.    Time 3    Period Weeks    Status New    Target Date 06/25/21               PT Long Term Goals - 06/02/21 1202       PT LONG TERM GOAL #1   Title Independent in HEP for bil. dorsiflexor strengthening and heel cord stretching.    Time 6    Period Weeks    Status New    Target Date 07/16/21      PT LONG TERM GOAL #2   Title Pt will verbalize understanding of options for orthotics including AFO and foot up brace for incr. safety with gait due to bil. foot drop.    Time 6    Period Weeks    Status New    Target Date 07/16/21      PT LONG TERM GOAL #3   Title Improve TUG score from 13.63 secs to </= 12.5 secs without device for reduced. fall risk.    Baseline 13.63 secs no device    Time 6    Period Weeks    Status New    Target Date 07/16/21                    Plan - 06/02/21 1146     Clinical Impression Statement Pt is an 86 yr old lady with bilateral foot drop of unknown etiology, which she states started approx. 6 months ago.  Pt reports she is referred to PT for ankle strengthening.  Pt is using a SPC for assistance with community ambulation but reports she does not use cane in her home.  Bil. dorsiflexor strength = 3-/5.  Pt will benefit from PT to address balance and gait deficits and decreased bil. ankle strength.    Personal Factors and Comorbidities Comorbidity 1;Time since onset of injury/illness/exacerbation    Comorbidities arthritis, peripheral neuropathy, osteopenia    Examination-Activity Limitations Locomotion Level;Transfers;Squat;Stairs    Examination-Participation Restrictions Cleaning;Laundry;Shop;Meal Prep    Stability/Clinical Decision Making Stable/Uncomplicated    Clinical Decision Making Low    Rehab Potential Good    PT Frequency 1x / week    PT Duration 6 weeks    PT Treatment/Interventions ADLs/Self Care Home Management;Gait training;Stair training;Therapeutic activities;Therapeutic exercise;DME  Instruction;Balance training;Neuromuscular re-education;Patient/family education;Orthotic Fit/Training    PT Next Visit Plan trial AFO/foot up brace; add to HEP - dorsiflexor strengthening    PT Home Exercise Plan heel cord stretching and heel raises    Consulted and Agree with Plan of Care Patient             Patient will benefit from skilled therapeutic intervention in order to improve the following deficits and impairments:  Abnormal gait, Decreased balance, Decreased strength, Impaired flexibility  Visit Diagnosis: Muscle weakness (generalized) - Plan: PT plan of care cert/re-cert  Other abnormalities of gait and mobility - Plan: PT plan of care cert/re-cert  Unsteadiness on feet - Plan: PT plan of care cert/re-cert     Problem List Patient Active Problem List   Diagnosis Date Noted   Chronic pain of right knee 05/27/2021   OSA on CPAP 05/27/2021   LLQ pain 12/16/2020   History of diverticulitis 12/16/2020   Hematochezia 12/16/2020   Abnormal colonoscopy 12/16/2020   Diverticulitis  of colon 08/15/2020   Sleep-related hypoxia 08/05/2019   OSA (obstructive sleep apnea) 08/05/2019   PLMD (periodic limb movement disorder) 08/05/2019   Cerebellar 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) 05/06/2019   Excessive daytime sleepiness 05/06/2019   Loud snoring 05/06/2019   Ventral hernia without obstruction or gangrene 02/22/2019   Chronic throat clearing 01/18/2019   Nocturnal cough 01/18/2019   Family history of colon cancer in father 01/18/2019   Diverticulosis 01/18/2019   Gait abnormality 12/27/2018   Numbness 12/27/2018   Status post total bilateral knee replacement 11/30/2018   DDD (degenerative disc disease), cervical 11/30/2018   Status post carpal tunnel release 11/30/2018   DDD (degenerative disc disease), lumbar 10/26/2018   Primary osteoarthritis of both hands 10/26/2018   Primary osteoarthritis of both feet 10/26/2018   Gastroesophageal reflux disease  08/27/2018   Bilateral impacted cerumen 08/21/2018   Sensorineural hearing loss (SNHL) of both ears 08/21/2018   Tinnitus, bilateral 08/21/2018   Blood in urine 04/17/2018   Secondary open-angle glaucoma of left eye, moderate stage 03/10/2018   Early stage 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 10/05/2017   Epiretinal membrane (ERM) of left eye 10/05/2017   Pseudophakia of both eyes 10/05/2017   Osteopenia 03/19/2017   Mixed hyperlipidemia 08/18/2016   Generalized osteoarthritis of multiple sites 08/18/2016   Peripheral neuropathic pain 08/18/2016   Overactive bladder 08/18/2016   Glaucoma 08/18/2016   Cervical disc disorder with radiculopathy of cervical region 08/17/2016   Carpal tunnel syndrome, right upper limb 08/17/2016   Abnormal finding on mammography 06/14/2016   Breast lump 05/06/2015    Kary Kos, PT 06/02/2021, 12:10 PM  [facility] [address] [place], [place], [address] Phone: [phone]   Fax:  [phone]  Name: Hailey Perkins MRN: [id number] Date of Birth: [birth year]

## 2021-06-02 NOTE — Telephone Encounter (Signed)
I called the patient back and provided her with this information. She will call [facility] at [phone] to schedule an appt.

## 2021-06-02 NOTE — Telephone Encounter (Signed)
Please see my chart note from 6/27. Dr. Renae Fickle is retired. She is already seen by Lala Lund, she just needs to call and make an appointment if she wishes.

## 2021-06-14 ENCOUNTER — Other Ambulatory Visit: Payer: Self-pay

## 2021-06-14 ENCOUNTER — Ambulatory Visit: Payer: Medicare Other | Attending: Neurology | Admitting: Physical Therapy

## 2021-06-14 DIAGNOSIS — M6281 Muscle weakness (generalized): Secondary | ICD-10-CM | POA: Diagnosis not present

## 2021-06-14 DIAGNOSIS — R2689 Other abnormalities of gait and mobility: Secondary | ICD-10-CM | POA: Diagnosis present

## 2021-06-15 ENCOUNTER — Encounter: Payer: Self-pay | Admitting: Physical Therapy

## 2021-06-15 NOTE — Therapy (Signed)
[facility] [address] [place], [place], [address] Phone: [phone]   Fax:  [phone]  Physical Therapy Treatment  Patient Details  Name: Hailey Perkins MRN: [id number] Date of Birth: [birth year] Referring Provider (PT): Dr. Levert Feinstein   Encounter Date: 06/14/2021   PT End of Session - 06/15/21 1252     Visit Number 2    Number of Visits 7   eval + 6 visits   Date for PT Re-Evaluation 07/16/21    Authorization Type UHC Medicare    Authorization Time Period 06-01-21 - 07-16-21    PT Start Time 1105    PT Stop Time 1151    PT Time Calculation (min) 46 min    Equipment Utilized During Treatment Gait belt    Activity Tolerance Patient tolerated treatment well    Behavior During Therapy [facility] for tasks assessed/performed             Past Medical History:  Diagnosis Date   Arthritis    Cataract 2010   bilateral eyes   Chicken pox    GERD (gastroesophageal reflux disease)    Glaucoma    History of frequent urinary tract infections    Hyperlipidemia    Osteopenia    Peripheral neuropathy    Sleep apnea    wears a CPAP   Urine incontinence     Past Surgical History:  Procedure Laterality Date   ABDOMINAL HYSTERECTOMY     APPENDECTOMY     BIOPSY  02/15/2021   Procedure: BIOPSY;  Surgeon: Lemar Lofty., MD;  Location: [facility];  Service: Gastroenterology;;   BREAST BIOPSY Bilateral 10+ yrs ago   BENIGN   BUNIONECTOMY Left 1975   Left Toe   COLONOSCOPY WITH PROPOFOL N/A 02/15/2021   Procedure: COLONOSCOPY WITH PROPOFOL;  Surgeon: Lemar Lofty., MD;  Location: [facility];  Service: Gastroenterology;  Laterality: N/A;  ultraslimscope    HAMMER TOE SURGERY Right 2005   Right foot correction hammertoe little toe   NEUROMA SURGERY Right 2003   Right Foot Neuroma, plus Bunionectomy, Right Great Toe   POLYPECTOMY  02/15/2021   Procedure: POLYPECTOMY;  Surgeon: Mansouraty, Netty Starring.,  MD;  Location: [facility];  Service: Gastroenterology;;   TONSILLECTOMY AND ADENOIDECTOMY      There were no vitals filed for this visit.   Subjective Assessment - 06/15/21 1131     Subjective Pt states she did the exercises as instructed in previous session - stretches and heel raises; pt states she is now wearing lace up shoes (New Balance sneakers, not the sandals she was wearing at time of eval)    Pertinent History peripheral neuropathy; osteopenia; cervical disc disease with radiculopathy; arthritis    Diagnostic tests MRI - was done in March 2022    Patient Stated Goals work on strengthening ankles    Currently in Pain? No/denies    Pain Onset More than a month ago   approx. 6 months ago                              [facility] Adult PT Treatment/Exercise - 06/15/21 0001       Ambulation/Gait   Ambulation/Gait Yes    Ambulation/Gait Assistance 4: Min guard    Assistive device Straight cane    Gait Pattern Decreased dorsiflexion - right;Decreased dorsiflexion - left    Ambulation Surface Level;Indoor    Gait Comments Ottobock walk on  AFO's were trialed for RLE and LLE; also Foot Up brace was trialed on each leg - with use of [facility]      Exercises   Exercises Ankle      Ankle Exercises: Seated   Other Seated Ankle Exercises active dorsiflexion RLE and LLE - 10 reps each foot - instructed to add this ex. to HEP            Ottobock AFO was trialed on RLE first, then on LLE with pt amb. with bil. AFO's approx. 25' due to c/o being uncomfortable in shoes - Partly due to hammer toe on RLE per pt report  Foot up brace was trialed on RLE - pt amb. Approx. 75' x 1 rep; then donned foot up brace on LLE - pt amb. Approx. 100' with bil. Foot up braces with SPC with SBA   Recommend AFO's due to pt being able to independently don them more easily than foot up braces - pt is going to consider which (if any) orthoses she wishes to obtain at this time         PT  Education - 06/15/21 1250     Education Details added seated dorsiflexion to HEP - instructed pt to progress to standing as tolerated    Person(s) Educated Patient    Methods Explanation;Demonstration    Comprehension Verbalized understanding;Returned demonstration              PT Short Term Goals - 06/15/21 1259       PT SHORT TERM GOAL #1   Title Trial foot up brace and AFO's for increased safety with gait due to bil. foot drop.    Time 3    Period Weeks    Status New    Target Date 06/25/21      PT SHORT TERM GOAL #2   Title Initiate HEP for bil. ankle strengthening and heel cord stretching.    Time 3    Period Weeks    Status New    Target Date 06/25/21               PT Long Term Goals - 06/15/21 1259       PT LONG TERM GOAL #1   Title Independent in HEP for bil. dorsiflexor strengthening and heel cord stretching.    Time 6    Period Weeks    Status New      PT LONG TERM GOAL #2   Title Pt will verbalize understanding of options for orthotics including AFO and foot up brace for incr. safety with gait due to bil. foot drop.    Time 6    Period Weeks    Status New      PT LONG TERM GOAL #3   Title Improve TUG score from 13.63 secs to </= 12.5 secs without device for reduced. fall risk.    Baseline 13.63 secs no device    Time 6    Period Weeks    Status New                   Plan - 06/15/21 1253     Clinical Impression Statement Foot up braces were beneficial in providing increased foot clearance in swing phase of gait, however, pt expressed concern in being able to don these braces independently, as she has arthritis in her hands.  Ottobock AFO's also beneficial in providing incr. foot clearance but pt stated her hammer toe on Rt foot made the brace  feel uncomfortable; pt also had removed the insole in each shoe and this resulted in lack of padding as brace was unable to be positioned under insert in shoe.  Will continue to assess most  appropriate orthotic.  Cont with POC.    Personal Factors and Comorbidities Comorbidity 1;Time since onset of injury/illness/exacerbation    Comorbidities arthritis, peripheral neuropathy, osteopenia    Examination-Activity Limitations Locomotion Level;Transfers;Squat;Stairs    Examination-Participation Restrictions Cleaning;Laundry;Shop;Meal Prep    Stability/Clinical Decision Making Stable/Uncomplicated    Rehab Potential Good    PT Frequency 1x / week    PT Duration 6 weeks    PT Treatment/Interventions ADLs/Self Care Home Management;Gait training;Stair training;Therapeutic activities;Therapeutic exercise;DME Instruction;Balance training;Neuromuscular re-education;Patient/family education;Orthotic Fit/Training    PT Next Visit Plan trial AFO/foot up brace; add to HEP - dorsiflexor strengthening    PT Home Exercise Plan heel cord stretching and heel raises    Consulted and Agree with Plan of Care Patient             Patient will benefit from skilled therapeutic intervention in order to improve the following deficits and impairments:  Abnormal gait, Decreased balance, Decreased strength, Impaired flexibility  Visit Diagnosis: Muscle weakness (generalized)  Other abnormalities of gait and mobility     Problem List Patient Active Problem List   Diagnosis Date Noted   Chronic pain of right knee 05/27/2021   OSA on CPAP 05/27/2021   LLQ pain 12/16/2020   History of diverticulitis 12/16/2020   Hematochezia 12/16/2020   Abnormal colonoscopy 12/16/2020   Diverticulitis of colon 08/15/2020   Sleep-related hypoxia 08/05/2019   OSA (obstructive sleep apnea) 08/05/2019   PLMD (periodic limb movement disorder) 08/05/2019   Cerebellar 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) 05/06/2019   Excessive daytime sleepiness 05/06/2019   Loud snoring 05/06/2019   Ventral hernia without obstruction or gangrene 02/22/2019   Chronic throat clearing 01/18/2019   Nocturnal cough 01/18/2019    Family history of colon cancer in father 01/18/2019   Diverticulosis 01/18/2019   Gait abnormality 12/27/2018   Numbness 12/27/2018   Status post total bilateral knee replacement 11/30/2018   DDD (degenerative disc disease), cervical 11/30/2018   Status post carpal tunnel release 11/30/2018   DDD (degenerative disc disease), lumbar 10/26/2018   Primary osteoarthritis of both hands 10/26/2018   Primary osteoarthritis of both feet 10/26/2018   Gastroesophageal reflux disease 08/27/2018   Bilateral impacted cerumen 08/21/2018   Sensorineural hearing loss (SNHL) of both ears 08/21/2018   Tinnitus, bilateral 08/21/2018   Blood in urine 04/17/2018   Secondary open-angle glaucoma of left eye, moderate stage 03/10/2018   Early stage 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 10/05/2017   Epiretinal membrane (ERM) of left eye 10/05/2017   Pseudophakia of both eyes 10/05/2017   Osteopenia 03/19/2017   Mixed hyperlipidemia 08/18/2016   Generalized osteoarthritis of multiple sites 08/18/2016   Peripheral neuropathic pain 08/18/2016   Overactive bladder 08/18/2016   Glaucoma 08/18/2016   Cervical disc disorder with radiculopathy of cervical region 08/17/2016   Carpal tunnel syndrome, right upper limb 08/17/2016   Abnormal finding on mammography 06/14/2016   Breast lump 05/06/2015    Kary Kos, PT 06/15/2021, 1:01 PM  [facility] [address] [place], [place], [address] Phone: [phone]   Fax:  [phone]  Name: Hailey Perkins MRN: [id number] Date of Birth: 01-25-1935

## 2021-06-21 ENCOUNTER — Other Ambulatory Visit: Payer: Self-pay

## 2021-06-21 ENCOUNTER — Ambulatory Visit: Payer: Medicare Other | Admitting: Physical Therapy

## 2021-06-21 DIAGNOSIS — R2689 Other abnormalities of gait and mobility: Secondary | ICD-10-CM

## 2021-06-21 DIAGNOSIS — M6281 Muscle weakness (generalized): Secondary | ICD-10-CM | POA: Diagnosis not present

## 2021-06-22 NOTE — Therapy (Signed)
[facility] [address] [place], [place], [address] Phone: [phone]   Fax:  [phone]  Physical Therapy Treatment  Patient Details  Name: Hailey Perkins MRN: [id number] Date of Birth: September 29, 1935 Referring Provider (PT): Dr. Levert Feinstein   Encounter Date: 06/21/2021   PT End of Session - 06/22/21 2110     Visit Number 3    Number of Visits 7   eval + 6 visits   Date for PT Re-Evaluation 07/16/21    Authorization Type UHC Medicare    Authorization Time Period 06-01-21 - 07-16-21    PT Start Time 1100    PT Stop Time 1159    PT Time Calculation (min) 59 min    Equipment Utilized During Treatment Gait belt    Activity Tolerance Patient tolerated treatment well    Behavior During Therapy [facility] for tasks assessed/performed             Past Medical History:  Diagnosis Date   Arthritis    Cataract 2010   bilateral eyes   Chicken pox    GERD (gastroesophageal reflux disease)    Glaucoma    History of frequent urinary tract infections    Hyperlipidemia    Osteopenia    Peripheral neuropathy    Sleep apnea    wears a CPAP   Urine incontinence     Past Surgical History:  Procedure Laterality Date   ABDOMINAL HYSTERECTOMY     APPENDECTOMY     BIOPSY  02/15/2021   Procedure: BIOPSY;  Surgeon: Lemar Lofty., MD;  Location: [facility];  Service: Gastroenterology;;   BREAST BIOPSY Bilateral 10+ yrs ago   BENIGN   BUNIONECTOMY Left 1975   Left Toe   COLONOSCOPY WITH PROPOFOL N/A 02/15/2021   Procedure: COLONOSCOPY WITH PROPOFOL;  Surgeon: Lemar Lofty., MD;  Location: [facility];  Service: Gastroenterology;  Laterality: N/A;  ultraslimscope    HAMMER TOE SURGERY Right 2005   Right foot correction hammertoe little toe   NEUROMA SURGERY Right 2003   Right Foot Neuroma, plus Bunionectomy, Right Great Toe   POLYPECTOMY  02/15/2021   Procedure: POLYPECTOMY;  Surgeon: Mansouraty, Netty Starring.,  MD;  Location: [facility];  Service: Gastroenterology;;   TONSILLECTOMY AND ADENOIDECTOMY      There were no vitals filed for this visit.   Subjective Assessment - 06/22/21 2108     Subjective Pt states she went shopping at [employer] - has brought 4 pairs of shoes she wants to try with the AFO's and wants to know which ones will work and is going to return the ones that will not work    Pertinent History peripheral neuropathy; osteopenia; cervical disc disease with radiculopathy; arthritis    Diagnostic tests MRI - was done in March 2022    Patient Stated Goals work on strengthening ankles    Currently in Pain? No/denies    Pain Onset More than a month ago   approx. 6 months ago              Treatment session focused on gait training with Ottobock walk on AFO's in the newly purchased New Balance shoes.  Pt had purchased 4 pairs of shoes from [employer] - New Balance sneakers and 3 pairs of shoes with velcro closures - Combination of AFO in Lt shoe - pt gait trained approx. 50' x1; AFO donned for Rt shoe and pt amb. With bil. AFO's -then AFO's Removed for gait training  for comparison.  Trialed AFO in the SAS shoes - with and without the shoe orthotic.  Various combinations trialed in today's session - in the different styles of shoes - with and without AFO, and with and without The foot orthotic with and without the AFO  Discussed AFO vs Foot up brace advantages and disadvantages - pt agreed at end of session to pursue obtaining order from MD for AFO's                           PT Short Term Goals - 06/22/21 2114       PT SHORT TERM GOAL #1   Title Trial foot up brace and AFO's for increased safety with gait due to bil. foot drop.    Time 3    Period Weeks    Status New    Target Date 06/25/21      PT SHORT TERM GOAL #2   Title Initiate HEP for bil. ankle strengthening and heel cord stretching.    Time 3    Period Weeks    Status New     Target Date 06/25/21               PT Long Term Goals - 06/22/21 2114       PT LONG TERM GOAL #1   Title Independent in HEP for bil. dorsiflexor strengthening and heel cord stretching.    Time 6    Period Weeks    Status New      PT LONG TERM GOAL #2   Title Pt will verbalize understanding of options for orthotics including AFO and foot up brace for incr. safety with gait due to bil. foot drop.    Time 6    Period Weeks    Status New      PT LONG TERM GOAL #3   Title Improve TUG score from 13.63 secs to </= 12.5 secs without device for reduced. fall risk.    Baseline 13.63 secs no device    Time 6    Period Weeks    Status New                   Plan - 06/22/21 2111     Clinical Impression Statement Pt's gait was most improved with use of Ottobock walk on AFO's in the new [employer] Balance shoes recently purchased.  Foot up brace was not trialed in today's session due to difficulty pt would have in donning and also due to the limited use of this brace with lace up shoes only.  Pt requests that order for AFO's be obtained from MD.    Personal Factors and Comorbidities Comorbidity 1;Time since onset of injury/illness/exacerbation    Comorbidities arthritis, peripheral neuropathy, osteopenia    Examination-Activity Limitations Locomotion Level;Transfers;Squat;Stairs    Examination-Participation Restrictions Cleaning;Laundry;Shop;Meal Prep    Stability/Clinical Decision Making Stable/Uncomplicated    Rehab Potential Good    PT Frequency 1x / week    PT Duration 6 weeks    PT Treatment/Interventions ADLs/Self Care Home Management;Gait training;Stair training;Therapeutic activities;Therapeutic exercise;DME Instruction;Balance training;Neuromuscular re-education;Patient/family education;Orthotic Fit/Training    PT Next Visit Plan cont dorsiflexor strengthening    PT Home Exercise Plan heel cord stretching and heel raises    Consulted and Agree with Plan of Care Patient              Patient will benefit from skilled therapeutic intervention in order to improve the following  deficits and impairments:  Abnormal gait, Decreased balance, Decreased strength, Impaired flexibility  Visit Diagnosis: Muscle weakness (generalized)  Other abnormalities of gait and mobility     Problem List Patient Active Problem List   Diagnosis Date Noted   Chronic pain of right knee 05/27/2021   OSA on CPAP 05/27/2021   LLQ pain 12/16/2020   History of diverticulitis 12/16/2020   Hematochezia 12/16/2020   Abnormal colonoscopy 12/16/2020   Diverticulitis of colon 08/15/2020   Sleep-related hypoxia 08/05/2019   OSA (obstructive sleep apnea) 08/05/2019   PLMD (periodic limb movement disorder) 08/05/2019   Cerebellar 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) 05/06/2019   Excessive daytime sleepiness 05/06/2019   Loud snoring 05/06/2019   Ventral hernia without obstruction or gangrene 02/22/2019   Chronic throat clearing 01/18/2019   Nocturnal cough 01/18/2019   Family history of colon cancer in father 01/18/2019   Diverticulosis 01/18/2019   Gait abnormality 12/27/2018   Numbness 12/27/2018   Status post total bilateral knee replacement 11/30/2018   DDD (degenerative disc disease), cervical 11/30/2018   Status post carpal tunnel release 11/30/2018   DDD (degenerative disc disease), lumbar 10/26/2018   Primary osteoarthritis of both hands 10/26/2018   Primary osteoarthritis of both feet 10/26/2018   Gastroesophageal reflux disease 08/27/2018   Bilateral impacted cerumen 08/21/2018   Sensorineural hearing loss (SNHL) of both ears 08/21/2018   Tinnitus, bilateral 08/21/2018   Blood in urine 04/17/2018   Secondary open-angle glaucoma of left eye, moderate stage 03/10/2018   Early stage 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 10/05/2017   Epiretinal membrane (ERM) of left eye 10/05/2017   Pseudophakia of both eyes 10/05/2017   Osteopenia  03/19/2017   Mixed hyperlipidemia 08/18/2016   Generalized osteoarthritis of multiple sites 08/18/2016   Peripheral neuropathic pain 08/18/2016   Overactive bladder 08/18/2016   Glaucoma 08/18/2016   Cervical disc disorder with radiculopathy of cervical region 08/17/2016   Carpal tunnel syndrome, right upper limb 08/17/2016   Abnormal finding on mammography 06/14/2016   Breast lump 05/06/2015    Kary Kos, PT 06/22/2021, 9:16 PM  [facility] [address] [place], [place], [address] Phone: [phone]   Fax:  [phone]  Name: Belem Hintze MRN: [id number] Date of Birth: December 15, 1934

## 2021-06-24 ENCOUNTER — Telehealth: Payer: Self-pay | Admitting: Physical Therapy

## 2021-06-24 NOTE — Telephone Encounter (Signed)
Dr. Terrace Arabia, Hailey Perkins is being treated by physical therapy for gait abnormality due to neuropathy. Hailey Perkins will benefit from use of bilateral AFO's in order to improve safety with functional mobility.    If you agree, please submit request in EPIC under MD Order, Other Orders (list bilateral AFO's in comments) or fax to [facility] at [phone].   Thank you, Kerry Fort, PT   [facility] [address] [place], [place]  [address] Phone:  [phone] Fax:  [phone]

## 2021-07-05 ENCOUNTER — Encounter: Payer: Self-pay | Admitting: Physical Therapy

## 2021-07-05 ENCOUNTER — Ambulatory Visit: Payer: Medicare Other | Attending: Neurology | Admitting: Physical Therapy

## 2021-07-05 ENCOUNTER — Other Ambulatory Visit: Payer: Self-pay

## 2021-07-05 DIAGNOSIS — R2681 Unsteadiness on feet: Secondary | ICD-10-CM | POA: Insufficient documentation

## 2021-07-05 DIAGNOSIS — M6281 Muscle weakness (generalized): Secondary | ICD-10-CM | POA: Insufficient documentation

## 2021-07-05 DIAGNOSIS — R2689 Other abnormalities of gait and mobility: Secondary | ICD-10-CM | POA: Insufficient documentation

## 2021-07-05 NOTE — Therapy (Signed)
[facility] [address] [place], [place], [address] Phone: [phone]   Fax:  [phone]  Physical Therapy Treatment  Patient Details  Name: Hailey Perkins MRN: [id number] Date of Birth: 09-15-1935 Referring Provider (PT): Dr. Marcial Pacas   Encounter Date: 07/05/2021   PT End of Session - 07/05/21 2019     Visit Number 4    Number of Visits 7   eval + 6 visits   Date for PT Re-Evaluation 07/16/21    Authorization Type [employer] Medicare    Authorization Time Period 06-01-21 - 07-16-21    PT Start Time 1110   pt arrived 10" late for appt.   PT Stop Time 1158    PT Time Calculation (min) 48 min    Equipment Utilized During Treatment Other (comment)   Ottobock Walk On AFO's   Activity Tolerance Patient tolerated treatment well    Behavior During Therapy WFL for tasks assessed/performed             Past Medical History:  Diagnosis Date   Arthritis    Cataract 2010   bilateral eyes   Chicken pox    GERD (gastroesophageal reflux disease)    Glaucoma    History of frequent urinary tract infections    Hyperlipidemia    Osteopenia    Peripheral neuropathy    Sleep apnea    wears a CPAP   Urine incontinence     Past Surgical History:  Procedure Laterality Date   ABDOMINAL HYSTERECTOMY     APPENDECTOMY     BIOPSY  02/15/2021   Procedure: BIOPSY;  Surgeon: Irving Copas., MD;  Location: [facility];  Service: Gastroenterology;;   BREAST BIOPSY Bilateral 10+ yrs ago   BENIGN   BUNIONECTOMY Left 1975   Left Toe   COLONOSCOPY WITH PROPOFOL N/A 02/15/2021   Procedure: COLONOSCOPY WITH PROPOFOL;  Surgeon: Irving Copas., MD;  Location: [facility];  Service: Gastroenterology;  Laterality: N/A;  ultraslimscope    HAMMER TOE SURGERY Right 2005   Right foot correction hammertoe little toe   NEUROMA SURGERY Right 2003   Right Foot Neuroma, plus Bunionectomy, Right Great Toe   POLYPECTOMY  02/15/2021    Procedure: POLYPECTOMY;  Surgeon: Mansouraty, Telford Nab., MD;  Location: [facility];  Service: Gastroenterology;;   TONSILLECTOMY AND ADENOIDECTOMY      There were no vitals filed for this visit.   Subjective Assessment - 07/05/21 2004     Subjective Pt reports she has been doing exercises at home; thinks the AFO's improved her walking.  (order requested from Dr. Krista Blue)    Pertinent History peripheral neuropathy; osteopenia; cervical disc disease with radiculopathy; arthritis    Diagnostic tests MRI - was done in March 2022    Patient Stated Goals work on strengthening ankles    Currently in Pain? No/denies    Pain Score 5     Pain Location Knee    Pain Orientation Right    Pain Descriptors / Indicators Aching    Pain Type Chronic pain    Pain Onset More than a month ago   approx. 6 months ago   Pain Frequency Intermittent                               [facility] Adult PT Treatment/Exercise - 07/05/21 0001       Ambulation/Gait   Ambulation/Gait Yes    Ambulation/Gait Assistance 5:  Supervision    Ambulation/Gait Assistance Details bil. Ottobock Walk on AFO's donned    Ambulation Distance (Feet) 115 Feet    Assistive device Other (Comment)   2 trekking poles   Gait Pattern Decreased dorsiflexion - right;Decreased dorsiflexion - left    Ambulation Surface Level;Indoor      Exercises   Exercises Knee/Hip;Ankle      Knee/Hip Exercises: Stretches   Active Hamstring Stretch Both;1 rep;30 seconds   runner's stretch in standing   Gastroc Stretch Right;Left;1 rep;30 seconds   used 2" step in standing     Knee/Hip Exercises: Standing   Heel Raises Both;1 set;10 reps    Rocker Board Other (comment)   30 reps with bil. UE support inside //bars   Rocker Board Limitations decreased ROM with rocking posteriorly due to bil. dorsiflexion weakness                    PT Education - 07/05/21 2018     Education Details gave pt info on where to purchase  trekking poles ([employer]) as pt prefers this device rather than a RW or rollator    Person(s) Educated Patient    Methods Explanation    Comprehension Verbalized understanding              PT Short Term Goals - 07/05/21 2024       PT SHORT TERM GOAL #1   Title Trial foot up brace and AFO's for increased safety with gait due to bil. foot drop.    Baseline met 06-22-21    Time 3    Period Weeks    Status Achieved    Target Date 06/25/21      PT SHORT TERM GOAL #2   Title Initiate HEP for bil. ankle strengthening and heel cord stretching.    Baseline met 06-22-21    Time 3    Period Weeks    Status Achieved    Target Date 06/25/21               PT Long Term Goals - 07/05/21 2026       PT LONG TERM GOAL #1   Title Independent in HEP for bil. dorsiflexor strengthening and heel cord stretching.    Time 6    Period Weeks    Status New      PT LONG TERM GOAL #2   Title Pt will verbalize understanding of options for orthotics including AFO and foot up brace for incr. safety with gait due to bil. foot drop.    Time 6    Period Weeks    Status New      PT LONG TERM GOAL #3   Title Improve TUG score from 13.63 secs to </= 12.5 secs without device for reduced. fall risk.    Baseline 13.63 secs no device    Time 6    Period Weeks    Status New                   Plan - 07/05/21 2020     Clinical Impression Statement Skilled session focused on bil. ankle musc. strengthening with exercises to facilitate dorsiflexion and plantarflexion and also on gait training with use of Ottobock AFO's and trial of trekking poles for increased stability with ambulation and also to facilitate upright posture.  Pt states she prefers the poles and not a RW or rollator.  Will resend request for order for AFO's as Dr. Yan has not responded   to initial request. Cont with POC.    Personal Factors and Comorbidities Comorbidity 1;Time since onset of injury/illness/exacerbation     Comorbidities arthritis, peripheral neuropathy, osteopenia    Examination-Activity Limitations Locomotion Level;Transfers;Squat;Stairs    Examination-Participation Restrictions Cleaning;Laundry;Shop;Meal Prep    Stability/Clinical Decision Making Stable/Uncomplicated    Rehab Potential Good    PT Frequency 1x / week    PT Duration 6 weeks    PT Treatment/Interventions ADLs/Self Care Home Management;Gait training;Stair training;Therapeutic activities;Therapeutic exercise;DME Instruction;Balance training;Neuromuscular re-education;Patient/family education;Orthotic Fit/Training    PT Next Visit Plan cont dorsiflexor strengthening    PT Home Exercise Plan heel cord stretching and heel raises    Consulted and Agree with Plan of Care Patient             Patient will benefit from skilled therapeutic intervention in order to improve the following deficits and impairments:  Abnormal gait, Decreased balance, Decreased strength, Impaired flexibility  Visit Diagnosis: Muscle weakness (generalized)  Other abnormalities of gait and mobility     Problem List Patient Active Problem List   Diagnosis Date Noted   Chronic pain of right knee 05/27/2021   OSA on CPAP 05/27/2021   LLQ pain 12/16/2020   History of diverticulitis 12/16/2020   Hematochezia 12/16/2020   Abnormal colonoscopy 12/16/2020   Diverticulitis of colon 08/15/2020   Sleep-related hypoxia 08/05/2019   OSA (obstructive sleep apnea) 08/05/2019   PLMD (periodic limb movement disorder) 08/05/2019   Cerebellar 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 ([place]) 05/06/2019   Excessive daytime sleepiness 05/06/2019   Loud snoring 05/06/2019   Ventral hernia without obstruction or gangrene 02/22/2019   Chronic throat clearing 01/18/2019   Nocturnal cough 01/18/2019   Family history of colon cancer in father 01/18/2019   Diverticulosis 01/18/2019   Gait abnormality 12/27/2018   Numbness 12/27/2018   Status post total bilateral knee  replacement 11/30/2018   DDD (degenerative disc disease), cervical 11/30/2018   Status post carpal tunnel release 11/30/2018   DDD (degenerative disc disease), lumbar 10/26/2018   Primary osteoarthritis of both hands 10/26/2018   Primary osteoarthritis of both feet 10/26/2018   Gastroesophageal reflux disease 08/27/2018   Bilateral impacted cerumen 08/21/2018   Sensorineural hearing loss (SNHL) of both ears 08/21/2018   Tinnitus, bilateral 08/21/2018   Blood in urine 04/17/2018   Secondary open-angle glaucoma of left eye, moderate stage 03/10/2018   Early stage 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 10/05/2017   Epiretinal membrane (ERM) of left eye 10/05/2017   Pseudophakia of both eyes 10/05/2017   Osteopenia 03/19/2017   Mixed hyperlipidemia 08/18/2016   Generalized osteoarthritis of multiple sites 08/18/2016   Peripheral neuropathic pain 08/18/2016   Overactive bladder 08/18/2016   Glaucoma 08/18/2016   Cervical disc disorder with radiculopathy of cervical region 08/17/2016   Carpal tunnel syndrome, right upper limb 08/17/2016   Abnormal finding on mammography 06/14/2016   Breast lump 05/06/2015    TJQZES, PQZRA QTMAUQJ, PT 07/05/2021, 8:28 PM  [facility] [address] [place], [place], [address] Phone: [phone]   Fax:  [phone]  Name: Kaelin Holford MRN: [id number] Date of Birth: 1935-04-22

## 2021-07-12 ENCOUNTER — Other Ambulatory Visit: Payer: Self-pay

## 2021-07-12 ENCOUNTER — Ambulatory Visit: Payer: Medicare Other | Admitting: Physical Therapy

## 2021-07-12 DIAGNOSIS — R2689 Other abnormalities of gait and mobility: Secondary | ICD-10-CM

## 2021-07-12 DIAGNOSIS — M6281 Muscle weakness (generalized): Secondary | ICD-10-CM

## 2021-07-12 DIAGNOSIS — R2681 Unsteadiness on feet: Secondary | ICD-10-CM

## 2021-07-12 NOTE — Patient Instructions (Signed)
Dorsiflexion: Resisted  --SEATED POSITION     Facing anchor, tubing around left foot, pull toward face.  Repeat __10__ times per set. Do __3__ sets per session. Do __2__ sessions per day.  HOLD foot up for 3-5 secs.  PROGRESSION; do exercise in standing position - no band - step slightly forward with the foot being exercised  Place band on top of foot in standing position  - hold on this for now

## 2021-07-12 NOTE — Therapy (Signed)
[facility] [address] [place], [place], [address] Phone: [phone]   Fax:  [phone]  Physical Therapy Treatment  Patient Details  Name: Hailey Perkins MRN: [id number] Date of Birth: 1935/08/21 Referring Provider (PT): Dr. Marcial Pacas   Encounter Date: 07/12/2021   PT End of Session - 07/12/21 2100     Visit Number 5    Number of Visits 7   eval + 6 visits   Date for PT Re-Evaluation 07/16/21    Authorization Type UHC Medicare    Authorization Time Period 06-01-21 - 07-16-21    PT Start Time 1102    PT Stop Time 1150    PT Time Calculation (Hailey) 48 Hailey    Equipment Utilized During Treatment --   Ottobock Walk On AFO's   Activity Tolerance Patient tolerated treatment well    Behavior During Therapy [facility] for tasks assessed/performed             Past Medical History:  Diagnosis Date   Arthritis    Cataract 2010   bilateral eyes   Chicken pox    GERD (gastroesophageal reflux disease)    Glaucoma    History of frequent urinary tract infections    Hyperlipidemia    Osteopenia    Peripheral neuropathy    Sleep apnea    wears a CPAP   Urine incontinence     Past Surgical History:  Procedure Laterality Date   ABDOMINAL HYSTERECTOMY     APPENDECTOMY     BIOPSY  02/15/2021   Procedure: BIOPSY;  Surgeon: Irving Copas., MD;  Location: [facility];  Service: Gastroenterology;;   BREAST BIOPSY Bilateral 10+ yrs ago   BENIGN   BUNIONECTOMY Left 1975   Left Toe   COLONOSCOPY WITH PROPOFOL N/A 02/15/2021   Procedure: COLONOSCOPY WITH PROPOFOL;  Surgeon: Irving Copas., MD;  Location: [facility];  Service: Gastroenterology;  Laterality: N/A;  ultraslimscope    HAMMER TOE SURGERY Right 2005   Right foot correction hammertoe little toe   NEUROMA SURGERY Right 2003   Right Foot Neuroma, plus Bunionectomy, Right Great Toe   POLYPECTOMY  02/15/2021   Procedure: POLYPECTOMY;  Surgeon:  Mansouraty, Telford Nab., MD;  Location: [facility];  Service: Gastroenterology;;   TONSILLECTOMY AND ADENOIDECTOMY      There were no vitals filed for this visit.   Subjective Assessment - 07/12/21 2041     Subjective Pt has brought walking poles which she ordered online - states she has been going to the [facility] for water exercise class and has also been doing exercises at home    Pertinent History peripheral neuropathy; osteopenia; cervical disc disease with radiculopathy; arthritis    Diagnostic tests MRI - was done in March 2022    Patient Stated Goals work on strengthening ankles    Currently in Pain? No/denies    Pain Onset More than a month ago   approx. 6 months ago                              [facility] Adult PT Treatment/Exercise - 07/12/21 1113       Ambulation/Gait   Ambulation/Gait Yes    Ambulation/Gait Assistance 5: Supervision    Ambulation/Gait Assistance Details pt used 2 walking poles for assistance with gait; initial cues given for correct sequence    Ambulation Distance (Feet) 230 Feet    Assistive device Other (Comment)  2 walking poles (trekking poles)   Gait Pattern Step-through pattern    Ambulation Surface Level;Indoor    Gait velocity 11.97 secs with poles = 2.74 ft/sec:  10.94 secs without poles = 3.0 ft/sec    Ramp 5: Supervision;Other (comment)   with use of 2 trekking poles   Curb 5: Supervision   2 trekking poles used - pt placed both poles up on top with ascension and placed both poles on floor with descension     Exercises   Exercises Ankle      Knee/Hip Exercises: Stretches   Active Hamstring Stretch Both;1 rep;30 seconds   runner's stretch   Gastroc Stretch Both;1 rep;30 seconds   in standing     Ankle Exercises: Standing   Heel Raises Both;10 reps   with bil UE support on PT's forearms for balance     Ankle Exercises: Seated   Toe Raise 10 reps   with yellow theraband - 10 reps each leg   Other Seated Ankle Exercises  informed pt of progression with dorsiflexion - in standing without resistance, with resistance in standing            TUG score = 13.68 secs without use of device:  11.37 secs with use of trekking poles        PT Education - 07/12/21 2059     Education Details instructed in resisted dorsiflexion with use of yellow theraband; walking poles adjusted to correct height for pt    Person(s) Educated Patient    Methods Explanation;Demonstration;Handout    Comprehension Verbalized understanding;Returned demonstration              PT Short Term Goals - 07/12/21 2108       PT SHORT TERM GOAL #1   Title Trial foot up brace and AFO's for increased safety with gait due to bil. foot drop.    Baseline met 06-22-21    Time 3    Period Weeks    Status Achieved    Target Date 06/25/21      PT SHORT TERM GOAL #2   Title Initiate HEP for bil. ankle strengthening and heel cord stretching.    Baseline met 06-22-21    Time 3    Period Weeks    Status Achieved    Target Date 06/25/21               PT Long Term Goals - 07/12/21 1136       PT LONG TERM GOAL #1   Title Independent in HEP for bil. dorsiflexor strengthening and heel cord stretching.    Baseline HEP updated on 07-12-21    Time 6    Period Weeks    Status On-going    Target Date 08/13/21      PT LONG TERM GOAL #2   Title Pt will verbalize understanding of options for orthotics including AFO and foot up brace for incr. safety with gait due to bil. foot drop.    Baseline met 07-12-21 -- hold on AFO's at this time due to increased strength in Bil. dorsiflexors - will cont to assess    Time 6    Period Weeks    Status Achieved      PT LONG TERM GOAL #3   Title Improve TUG score from 13.63 secs to </= 12.5 secs without device for reduced. fall risk.    Baseline 13.63 secs no device;   07-12-21 - 13.68 secs without device;  11.37 secs with poles  Time 6    Period Weeks    Status On-going                    Plan - 07/12/21 1136     Clinical Impression Statement Pt has partially met LTG's and demonstrates improvement in Rt dorsiflexor strength compared to strength grades at time of initial eval.  Rt dorsflexor strength is 3+/5; Lt dorsiflexor strength remains 3-/5.  Pt's gait is much improved with use of bilateral trekking poles for assistance with ambulation.  LTG #1 is ongoing, LTG #2 is achieved and LTG #3 is not met as TUG score remains unchanged as tested without use of device, i.e. trekking poles This goal will be ongoing.  Plan is to see pt every other week for 1 month to reassess strength and determine need for AFO's.  Pt agrees with this plan.    Personal Factors and Comorbidities Comorbidity 1;Time since onset of injury/illness/exacerbation    Comorbidities arthritis, peripheral neuropathy, osteopenia    Examination-Activity Limitations Locomotion Level;Transfers;Squat;Stairs    Examination-Participation Restrictions Cleaning;Laundry;Shop;Meal Prep    Stability/Clinical Decision Making Stable/Uncomplicated    Rehab Potential Good    PT Frequency 1x / week    PT Duration 6 weeks    PT Treatment/Interventions ADLs/Self Care Home Management;Gait training;Stair training;Therapeutic activities;Therapeutic exercise;DME Instruction;Balance training;Neuromuscular re-education;Patient/family education;Orthotic Fit/Training    PT Next Visit Plan cont dorsiflexor strengthening; check dorsiflexor strengthening exs with yellow theraband    PT Home Exercise Plan heel cord stretching and heel raises    Consulted and Agree with Plan of Care Patient             Patient will benefit from skilled therapeutic intervention in order to improve the following deficits and impairments:  Abnormal gait, Decreased balance, Decreased strength, Impaired flexibility  Visit Diagnosis: Muscle weakness (generalized) - Plan: PT plan of care cert/re-cert  Other abnormalities of gait and mobility -  Plan: PT plan of care cert/re-cert  Unsteadiness on feet - Plan: PT plan of care cert/re-cert     Problem List Patient Active Problem List   Diagnosis Date Noted   Chronic pain of right knee 05/27/2021   OSA on CPAP 05/27/2021   LLQ pain 12/16/2020   History of diverticulitis 12/16/2020   Hematochezia 12/16/2020   Abnormal colonoscopy 12/16/2020   Diverticulitis of colon 08/15/2020   Sleep-related hypoxia 08/05/2019   OSA (obstructive sleep apnea) 08/05/2019   PLMD (periodic limb movement disorder) 08/05/2019   Cerebellar 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 ([place]) 05/06/2019   Excessive daytime sleepiness 05/06/2019   Loud snoring 05/06/2019   Ventral hernia without obstruction or gangrene 02/22/2019   Chronic throat clearing 01/18/2019   Nocturnal cough 01/18/2019   Family history of colon cancer in father 01/18/2019   Diverticulosis 01/18/2019   Gait abnormality 12/27/2018   Numbness 12/27/2018   Status post total bilateral knee replacement 11/30/2018   DDD (degenerative disc disease), cervical 11/30/2018   Status post carpal tunnel release 11/30/2018   DDD (degenerative disc disease), lumbar 10/26/2018   Primary osteoarthritis of both hands 10/26/2018   Primary osteoarthritis of both feet 10/26/2018   Gastroesophageal reflux disease 08/27/2018   Bilateral impacted cerumen 08/21/2018   Sensorineural hearing loss (SNHL) of both ears 08/21/2018   Tinnitus, bilateral 08/21/2018   Blood in urine 04/17/2018   Secondary open-angle glaucoma of left eye, moderate stage 03/10/2018   Early stage 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 10/05/2017   Epiretinal membrane (ERM) of left eye  10/05/2017   Pseudophakia of both eyes 10/05/2017   Osteopenia 03/19/2017   Mixed hyperlipidemia 08/18/2016   Generalized osteoarthritis of multiple sites 08/18/2016   Peripheral neuropathic pain 08/18/2016   Overactive bladder 08/18/2016   Glaucoma 08/18/2016   Cervical  disc disorder with radiculopathy of cervical region 08/17/2016   Carpal tunnel syndrome, right upper limb 08/17/2016   Abnormal finding on mammography 06/14/2016   Breast lump 05/06/2015    Alda Lea, PT 07/12/2021, 9:23 PM  [facility] [address] [place], [place], [address] Phone: [phone]   Fax:  [phone]  Name: Dijon Kohlman MRN: [id number] Date of Birth: 11-19-1935

## 2021-07-19 NOTE — Progress Notes (Signed)
Office Visit Note  Patient: Hailey Perkins             Date of Birth: 08/23/1935           MRN: [id number]             PCP: Jarome Matin, MD Referring: Jarome Matin, MD Visit Date: 08/02/2021 Occupation: @GUAROCC @  Subjective:  Pain in both hands and lower back pain.   History of Present Illness: Hailey Perkins is a 86 y.o. female in consultation per request of Dr. 97.  Patient was seen last in the office in November 2019.  At the time she presented with pain in her right forearm, bilateral hands, bilateral feet.  The x-rays of the right forearm were unremarkable.  The x-rays and the clinical findings are consistent with severe osteoarthritis in her hands.  She also had osteoarthritic changes in her feet.  She has known history of bilateral total knee replacement, degenerative disc disease of cervical and lumbar spine.  According to the patient her hand pain has been progressively getting worse.  She has difficulty with grip strength and holding objects.  She describes pain in all of her fingers and her CMC joints.  She states she has bilateral thumb, right second and third trigger finger.  She had right carpal tunnel release in the past.  She denies any numbness in her right hand.  She states she has been wearing proper fitting shoes which helps with the arts osteoarthritis in her feet.  Recently she has been experiencing pain in her right lower extremity.  She states Dr. December 2019 did MRI of her lumbar spine which showed degenerative changes.  She is going for physical therapy.  She states she is also experiencing foot drop in her bilateral feet.  She has not seen a back specialist.  Activities of Daily Living:  Patient reports morning stiffness for 5 minutes.   Patient Reports nocturnal pain.  Difficulty dressing/grooming: Denies Difficulty climbing stairs: Reports Difficulty getting out of chair: Reports Difficulty using hands for taps, buttons, cutlery, and/or writing:  Reports  Review of Systems  Constitutional:  Positive for fatigue.  HENT:  Negative for mouth sores, mouth dryness and nose dryness.   Eyes:  Positive for dryness. Negative for pain and itching.  Respiratory:  Negative for shortness of breath and difficulty breathing.   Cardiovascular:  Negative for chest pain and palpitations.  Gastrointestinal:  Negative for blood in stool, constipation and diarrhea.  Endocrine: Negative for increased urination.  Genitourinary:  Negative for difficulty urinating.  Musculoskeletal:  Positive for joint pain, joint pain, morning stiffness and muscle tenderness. Negative for joint swelling, myalgias and myalgias.  Skin:  Negative for color change, rash and redness.  Allergic/Immunologic: Negative for susceptible to infections.  Neurological:  Positive for numbness. Negative for dizziness, headaches, memory loss and weakness.  Hematological:  Negative for bruising/bleeding tendency.  Psychiatric/Behavioral:  Negative for confusion.    PMFS History:  Patient Active Problem List   Diagnosis Date Noted   Chronic pain of right knee 05/27/2021   OSA on CPAP 05/27/2021   LLQ pain 12/16/2020   History of diverticulitis 12/16/2020   Hematochezia 12/16/2020   Abnormal colonoscopy 12/16/2020   Diverticulitis of colon 08/15/2020   Sleep-related hypoxia 08/05/2019   OSA (obstructive sleep apnea) 08/05/2019   PLMD (periodic limb movement disorder) 08/05/2019   Cerebellar 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) 05/06/2019   Excessive daytime sleepiness 05/06/2019   Loud snoring 05/06/2019  Ventral hernia without obstruction or gangrene 02/22/2019   Chronic throat clearing 01/18/2019   Nocturnal cough 01/18/2019   Family history of colon cancer in father 01/18/2019   Diverticulosis 01/18/2019   Gait abnormality 12/27/2018   Numbness 12/27/2018   Status post total bilateral knee replacement 11/30/2018   DDD (degenerative disc disease), cervical  11/30/2018   Status post carpal tunnel release 11/30/2018   DDD (degenerative disc disease), lumbar 10/26/2018   Primary osteoarthritis of both hands 10/26/2018   Primary osteoarthritis of both feet 10/26/2018   Gastroesophageal reflux disease 08/27/2018   Bilateral impacted cerumen 08/21/2018   Sensorineural hearing loss (SNHL) of both ears 08/21/2018   Tinnitus, bilateral 08/21/2018   Blood in urine 04/17/2018   Secondary open-angle glaucoma of left eye, moderate stage 03/10/2018   Early stage 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 10/05/2017   Epiretinal membrane (ERM) of left eye 10/05/2017   Pseudophakia of both eyes 10/05/2017   Osteopenia 03/19/2017   Mixed hyperlipidemia 08/18/2016   Generalized osteoarthritis of multiple sites 08/18/2016   Peripheral neuropathic pain 08/18/2016   Overactive bladder 08/18/2016   Glaucoma 08/18/2016   Cervical disc disorder with radiculopathy of cervical region 08/17/2016   Carpal tunnel syndrome, right upper limb 08/17/2016   Abnormal finding on mammography 06/14/2016   Breast lump 05/06/2015    Past Medical History:  Diagnosis Date   Arthritis    Cataract 2010   bilateral eyes   Chicken pox    GERD (gastroesophageal reflux disease)    Glaucoma    History of frequent urinary tract infections    Hyperlipidemia    Osteopenia    Peripheral neuropathy    Sleep apnea    wears a CPAP   Urine incontinence     Family History  Problem Relation Age of Onset   Hyperlipidemia Mother    Heart disease Mother    Hypertension Mother    Cancer Father    Colon cancer Father    Alcohol abuse Brother    Drug abuse Brother    Cancer Brother    Cancer Maternal Aunt    Breast cancer Maternal Aunt    Colon cancer Maternal Grandmother    Brain cancer Son    Autoimmune disease Daughter        Behcet's   Esophageal cancer Neg Hx    Inflammatory bowel disease Neg Hx    Liver disease Neg Hx    Pancreatic cancer Neg Hx     Rectal cancer Neg Hx    Stomach cancer Neg Hx    Past Surgical History:  Procedure Laterality Date   ABDOMINAL HYSTERECTOMY  1977   ANAL FISTULECTOMY  1985   ANKLE FRACTURE SURGERY Right 2014   APPENDECTOMY     BIOPSY  02/15/2021   Procedure: BIOPSY;  Surgeon: Meridee ScoreMansouraty, Netty StarringGabriel Jr., MD;  Location: [facility];  Service: Gastroenterology;;   BREAST BIOPSY Bilateral 10+ yrs ago   BENIGN   BUNIONECTOMY Left 1975   Left Toe   CATARACT EXTRACTION, BILATERAL  2009   COLONOSCOPY WITH PROPOFOL N/A 02/15/2021   Procedure: COLONOSCOPY WITH PROPOFOL;  Surgeon: Lemar LoftyMansouraty, Gabriel Jr., MD;  [facility];  Service: Gastroenterology;  Laterality: N/A;  ultraslimscope    DILATION AND CURETTAGE OF UTERUS  1972   eye PRK vision correction Left 10/09/2013   eye socket re-formed Left 2014   FOOT NEUROMA SURGERY Right 2003   plus bunionectomy, right great toe   HAMMER TOE SURGERY Right 2005  Right foot correction hammertoe little toe   HEMORRHOID SURGERY  1967   HIP SURGERY Right 2014   right hip femur pin implanted   KNEE ARTHROSCOPY Left 2010   debridement left knee scar tissue   LAPAROSCOPIC OOPHERECTOMY     1986, 1990   LASIK  1998   NEUROMA SURGERY Right 2003   Right Foot Neuroma, plus Bunionectomy, Right Great Toe   POLYPECTOMY  02/15/2021   Procedure: POLYPECTOMY;  Surgeon: Mansouraty, Netty Starring., MD;  Location: [facility];  Service: Gastroenterology;;   RECTOCELE REPAIR  1968   REPLACEMENT TOTAL KNEE Left 01/2008   REPLACEMENT TOTAL KNEE Right 08/2008   retinal pucker correction Left 07/14/2012   schwannoma tumor (benign) removal  2011   right rib   SIGMOIDOSCOPY     TONSILLECTOMY AND ADENOIDECTOMY     VENTRAL HERNIA REPAIR  1993   Social History   Social History Narrative   Lives alone but her daughter lives next door.   Right-handed.   Occasional caffeine.   Immunization History  Administered Date(s) Administered   Influenza, High Dose Seasonal PF  08/18/2016, 08/25/2017   Influenza-Unspecified 08/19/2014   PFIZER(Purple Top)SARS-COV-2 Vaccination 12/14/2019, 01/04/2020, 09/15/2020, 07/05/2021   Pneumococcal Conjugate-13 06/03/2014   Pneumococcal Polysaccharide-23 05/25/2015   Tdap 02/26/2015   Zoster Recombinat (Shingrix) 07/04/2018, 08/17/2018   Zoster, Live 06/29/2011     Objective: Vital Signs: BP 138/82 (BP Location: Left Arm, Patient Position: Sitting, Cuff Size: Normal)   Pulse 79   Ht 5' 3.5" (1.613 m)   Wt 168 lb (76.2 kg)   BMI 29.29 kg/m    Physical Exam Vitals and nursing note reviewed.  Constitutional:      Appearance: She is well-developed.  HENT:     Head: Normocephalic and atraumatic.  Eyes:     Conjunctiva/sclera: Conjunctivae normal.  Cardiovascular:     Rate and Rhythm: Normal rate and regular rhythm.     Heart sounds: Normal heart sounds.  Pulmonary:     Effort: Pulmonary effort is normal.     Breath sounds: Normal breath sounds.  Abdominal:     General: Bowel sounds are normal.     Palpations: Abdomen is soft.  Musculoskeletal:     Cervical back: Normal range of motion.  Lymphadenopathy:     Cervical: No cervical adenopathy.  Skin:    General: Skin is warm and dry.     Capillary Refill: Capillary refill takes less than 2 seconds.  Neurological:     Mental Status: She is alert and oriented to person, place, and time.  Psychiatric:        Behavior: Behavior normal.     Musculoskeletal Exam: C-spine was in good range of motion.  She has some thoracic kyphosis.  She had no point tenderness over her lumbar region.  Shoulder joints, elbow joints, wrist joints with good range of motion.  She had bilateral CMC PIP and DIP thickening with no synovitis.  She had flexor tendon thickening in her bilateral index finger and right middle finger.  Hip joints with good range of motion.  She had bilateral total knee replacement with good range of motion without any warmth swelling or effusion.  She had  bilateral pedal edema.  There was no tenderness over ankles or MTPs.  CDAI Exam: CDAI Score: -- Patient Global: --; Provider Global: -- Swollen: --; Tender: -- Joint Exam 08/02/2021   No joint exam has been documented for this visit   There is currently no information documented  on the homunculus. Go to the Rheumatology activity and complete the homunculus joint exam.  Investigation: No additional findings.  Imaging: XR Hand 2 View Left  Result Date: 08/02/2021 Severe CMC narrowing and subluxation was noted.  PIP and DIP narrowing was noted.  No MCP, intercarpal or radiocarpal joint space narrowing was noted.  No erosive changes were noted.  No radiographic progression was noted when compared to the x-rays of 2019. Impression: These findings are consistent with osteoarthritis of the hand.  XR Hand 2 View Right  Result Date: 08/02/2021 Severe CMC narrowing and subluxation was noted.  PIP and DIP narrowing was noted.  No MCP, intercarpal or radiocarpal joint space narrowing was noted.  No radiographic progression was noted when compared to the x-rays of 2019. Impression: These findings are consistent with osteoarthritis of the hand.   Recent Labs: Lab Results  Component Value Date   WBC 9.5 12/16/2020   HGB 13.5 12/16/2020   PLT 212.0 12/16/2020   NA 138 12/16/2020   K 4.6 12/16/2020   CL 103 12/16/2020   CO2 31 12/16/2020   GLUCOSE 112 (H) 12/16/2020   BUN 11 12/16/2020   CREATININE 0.78 12/16/2020   BILITOT 0.4 08/14/2020   ALKPHOS 84 08/14/2020   AST 20 08/14/2020   ALT 16 08/14/2020   PROT 6.9 08/14/2020   ALBUMIN 4.1 08/14/2020   CALCIUM 9.6 12/16/2020    Speciality Comments: No specialty comments available.  Procedures:  No procedures performed Allergies: Tape   Assessment / Plan:     Visit Diagnoses: Primary osteoarthritis of both hands - clinical findings are consistent with severe osteoarthritis involving her PIP/DIP and CMC joints.  She has been  experiencing increased pain and discomfort in her bilateral hands which she describes in PIP and DIP joints.  She states she has intermittent swelling in her DIP joints.  No synovitis was noted today.  She has incomplete fist formation.  Joint protection muscle strengthening was discussed.  A handout on hand exercises was given.  Detailed counsel regarding osteoarthritis was provided.  I would also refer her to PT and OT at [facility].  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  X-rays of bilateral hands were consistent with osteoarthritis.  No radiographic progression was noted when compared to the x-rays of 2019.  Trigger index finger of right hand-she complains of some discomfort in her right index finger she states she notices intermittent locking.  She has some flexor tendon thickening but no triggering was noted today.  Advised her to massage the flexor tendon.  Trigger finger, right middle finger-flexor tendon massage was discussed.  If she has persistent problem we can consider cortisone injection in the future.  Status post total knee replacement, bilateral - 2009.  Doing well.  Primary osteoarthritis of both feet - s/p bilateral bunionectomy -severe osteoarthritis involving bilateral feet.  She states her symptoms have improved since she has been using proper fitting shoes.  She still has difficulty walking due to neuropathy.  She also gives history of foot drop.  Status post carpal tunnel release - Right.  She denies any paresthesias but has discomfort in the scar tissue.  DDD (degenerative disc disease), cervical-she had good range of motion of her cervical spine without much discomfort today.  DDD (degenerative disc disease), lumbar-she recently had MRI of her lumbar spine which showed degenerative disc disease and facet joint arthropathy.  She  had mild spinal stenosis at L3 and 4.  She gives history of bilateral  foot drop.  I will refer her to neurosurgery for evaluation.  She is going to physical therapy.  Osteopenia of multiple sites-she had a bone density on March 19, 2021 which showed a T score of -1.5 and BMD 0.823 in the left femur.  Use of calcium rich diet with vitamin D and resistive exercises were discussed.  She does water aerobics.  She also walks on a regular basis.  Other medical problems are listed as follows:  Mixed hyperlipidemia  Peripheral neurogenic pain-patient complains of neurogenic pain in her left foot related to her knee surgery and also her lower back.  Secondary open-angle glaucoma of left eye, moderate stage  History of gastroesophageal reflux (GERD)  Early stage 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  Pseudophakia of both eyes  Orders: Orders Placed This Encounter  Procedures   XR Hand 2 View Right   XR Hand 2 View Left   Ambulatory referral to Hand Surgery   Ambulatory referral to Neurosurgery    No orders of the defined types were placed in this encounter.    Follow-Up Instructions: Return in about 3 months (around 11/02/2021) for Osteoarthritis.   Pollyann Savoy, MD  Note - This record has been created using Animal nutritionist.  Chart creation errors have been sought, but may not always  have been located. Such creation errors do not reflect on  the standard of medical care.

## 2021-08-02 ENCOUNTER — Other Ambulatory Visit: Payer: Self-pay

## 2021-08-02 ENCOUNTER — Encounter: Payer: Self-pay | Admitting: Rheumatology

## 2021-08-02 ENCOUNTER — Ambulatory Visit: Payer: Self-pay

## 2021-08-02 ENCOUNTER — Ambulatory Visit: Payer: Medicare Other | Admitting: Rheumatology

## 2021-08-02 ENCOUNTER — Ambulatory Visit: Payer: Medicare Other | Admitting: Physical Therapy

## 2021-08-02 VITALS — BP 138/82 | HR 79 | Ht 63.5 in | Wt 168.0 lb

## 2021-08-02 DIAGNOSIS — H4052X2 Glaucoma secondary to other eye disorders, left eye, moderate stage: Secondary | ICD-10-CM

## 2021-08-02 DIAGNOSIS — M19071 Primary osteoarthritis, right ankle and foot: Secondary | ICD-10-CM

## 2021-08-02 DIAGNOSIS — M79631 Pain in right forearm: Secondary | ICD-10-CM

## 2021-08-02 DIAGNOSIS — M792 Neuralgia and neuritis, unspecified: Secondary | ICD-10-CM

## 2021-08-02 DIAGNOSIS — M65321 Trigger finger, right index finger: Secondary | ICD-10-CM

## 2021-08-02 DIAGNOSIS — M5030 Other cervical disc degeneration, unspecified cervical region: Secondary | ICD-10-CM

## 2021-08-02 DIAGNOSIS — M19042 Primary osteoarthritis, left hand: Secondary | ICD-10-CM

## 2021-08-02 DIAGNOSIS — Z8719 Personal history of other diseases of the digestive system: Secondary | ICD-10-CM

## 2021-08-02 DIAGNOSIS — M65331 Trigger finger, right middle finger: Secondary | ICD-10-CM | POA: Diagnosis not present

## 2021-08-02 DIAGNOSIS — Z961 Presence of intraocular lens: Secondary | ICD-10-CM

## 2021-08-02 DIAGNOSIS — Z96653 Presence of artificial knee joint, bilateral: Secondary | ICD-10-CM

## 2021-08-02 DIAGNOSIS — M8589 Other specified disorders of bone density and structure, multiple sites: Secondary | ICD-10-CM

## 2021-08-02 DIAGNOSIS — Z9889 Other specified postprocedural states: Secondary | ICD-10-CM

## 2021-08-02 DIAGNOSIS — H353131 Nonexudative age-related macular degeneration, bilateral, early dry stage: Secondary | ICD-10-CM

## 2021-08-02 DIAGNOSIS — E782 Mixed hyperlipidemia: Secondary | ICD-10-CM

## 2021-08-02 DIAGNOSIS — M19041 Primary osteoarthritis, right hand: Secondary | ICD-10-CM | POA: Diagnosis not present

## 2021-08-02 DIAGNOSIS — M5136 Other intervertebral disc degeneration, lumbar region: Secondary | ICD-10-CM

## 2021-08-02 DIAGNOSIS — M19072 Primary osteoarthritis, left ankle and foot: Secondary | ICD-10-CM

## 2021-08-02 NOTE — Patient Instructions (Signed)
Hand Exercises Hand exercises can be helpful for almost anyone. These exercises can strengthen the hands, improve flexibility and movement, and increase blood flow to the hands. These results can make work and daily tasks easier. Hand exercises can be especially helpful for people who have joint pain from arthritis or have nerve damage from overuse (carpal tunnel syndrome). These exercises can also help people who have injured a hand. Exercises Most of these hand exercises are gentle stretching and motion exercises. It is usually safe to do them often throughout the day. Warming up your hands before exercise may help to reduce stiffness. You can do this with gentle massage orby placing your hands in warm water for 10-15 minutes. It is normal to feel some stretching, pulling, tightness, or mild discomfort as you begin new exercises. This will gradually improve. Stop an exercise right away if you feel sudden, severe pain or your pain gets worse. Ask your healthcare provider which exercises are best for you. Knuckle bend or "claw" fist Stand or sit with your arm, hand, and all five fingers pointed straight up. Make sure to keep your wrist straight during the exercise. Gently bend your fingers down toward your palm until the tips of your fingers are touching the top of your palm. Keep your big knuckle straight and just bend the small knuckles in your fingers. Hold this position for __________ seconds. Straighten (extend) your fingers back to the starting position. Repeat this exercise 5-10 times with each hand. Full finger fist Stand or sit with your arm, hand, and all five fingers pointed straight up. Make sure to keep your wrist straight during the exercise. Gently bend your fingers into your palm until the tips of your fingers are touching the middle of your palm. Hold this position for __________ seconds. Extend your fingers back to the starting position, stretching every joint fully. Repeat this  exercise 5-10 times with each hand. Straight fist Stand or sit with your arm, hand, and all five fingers pointed straight up. Make sure to keep your wrist straight during the exercise. Gently bend your fingers at the big knuckle, where your fingers meet your hand, and the middle knuckle. Keep the knuckle at the tips of your fingers straight and try to touch the bottom of your palm. Hold this position for __________ seconds. Extend your fingers back to the starting position, stretching every joint fully. Repeat this exercise 5-10 times with each hand. Tabletop Stand or sit with your arm, hand, and all five fingers pointed straight up. Make sure to keep your wrist straight during the exercise. Gently bend your fingers at the big knuckle, where your fingers meet your hand, as far down as you can while keeping the small knuckles in your fingers straight. Think of forming a tabletop with your fingers. Hold this position for __________ seconds. Extend your fingers back to the starting position, stretching every joint fully. Repeat this exercise 5-10 times with each hand. Finger spread Place your hand flat on a table with your palm facing down. Make sure your wrist stays straight as you do this exercise. Spread your fingers and thumb apart from each other as far as you can until you feel a gentle stretch. Hold this position for __________ seconds. Bring your fingers and thumb tight together again. Hold this position for __________ seconds. Repeat this exercise 5-10 times with each hand. Making circles Stand or sit with your arm, hand, and all five fingers pointed straight up. Make sure to keep your   wrist straight during the exercise. Make a circle by touching the tip of your thumb to the tip of your index finger. Hold for __________ seconds. Then open your hand wide. Repeat this motion with your thumb and each finger on your hand. Repeat this exercise 5-10 times with each hand. Thumb motion Sit  with your forearm resting on a table and your wrist straight. Your thumb should be facing up toward the ceiling. Keep your fingers relaxed as you move your thumb. Lift your thumb up as high as you can toward the ceiling. Hold for __________ seconds. Bend your thumb across your palm as far as you can, reaching the tip of your thumb for the small finger (pinkie) side of your palm. Hold for __________ seconds. Repeat this exercise 5-10 times with each hand. Grip strengthening  Hold a stress ball or other soft ball in the middle of your hand. Slowly increase the pressure, squeezing the ball as much as you can without causing pain. Think of bringing the tips of your fingers into the middle of your palm. All of your finger joints should bend when doing this exercise. Hold your squeeze for __________ seconds, then relax. Repeat this exercise 5-10 times with each hand. Contact a health care provider if: Your hand pain or discomfort gets much worse when you do an exercise. Your hand pain or discomfort does not improve within 2 hours after you exercise. If you have any of these problems, stop doing these exercises right away. Do not do them again unless your health care provider says that you can. Get help right away if: You develop sudden, severe hand pain or swelling. If this happens, stop doing these exercises right away. Do not do them again unless your health care provider says that you can. This information is not intended to replace advice given to you by your health care provider. Make sure you discuss any questions you have with your healthcare provider. Document Revised: 03/14/2019 Document Reviewed: 11/22/2018 Elsevier Patient Education  2022 [employer]  

## 2021-08-16 ENCOUNTER — Ambulatory Visit: Payer: Medicare Other | Admitting: Physical Therapy

## 2021-08-19 ENCOUNTER — Ambulatory Visit: Payer: Medicare Other | Attending: Neurology | Admitting: Physical Therapy

## 2021-08-19 ENCOUNTER — Other Ambulatory Visit: Payer: Self-pay

## 2021-08-19 DIAGNOSIS — R2689 Other abnormalities of gait and mobility: Secondary | ICD-10-CM | POA: Diagnosis present

## 2021-08-19 DIAGNOSIS — M6281 Muscle weakness (generalized): Secondary | ICD-10-CM | POA: Diagnosis present

## 2021-08-19 DIAGNOSIS — R2681 Unsteadiness on feet: Secondary | ICD-10-CM | POA: Diagnosis present

## 2021-08-19 NOTE — Patient Instructions (Signed)
Step ups onto step in pool - 10 reps each leg;     Tap ups alternating feet to step - minimal hand support - do slowly to work on balance    Can try standing on left tip toes and tapping step with right foot (HARD)    Braiding - GRAPEVINE    Move to side: 1) cross right leg in front of left, 2) bring back leg out to side, then 3) cross right leg behind left, 4) bring left leg out to side. Continue sequence in same direction. Reverse sequence, moving in opposite direction. Repeat sequence ____ times per session. Do ____ sessions per day. Repeat on compliant surface: ________.  Copyright  VHI. All rights reserved.     SINGLE LIMB STANCE    Stance: single leg on floor. Raise leg. Hold ___ seconds. Repeat with other leg. ___ reps per set, ___ sets per day, ___ days per week  Copyright  VHI. All rights reserved.    Dorsiflexion: Resisted    Facing anchor, tubing around left foot, pull toward face.  Repeat __10__ times per set. Do __1__ sets per session. Do __1__ sessions per day.  SEATED position - hold band down with other foot    STANDING - no band - step forward - lift foot up with heel remaining down on floor ( in pool)   http://[email or web address].exer.[email or web address]   Copyright  VHI. All rights reserved.     Heel Raise: Bilateral (Standing)    Rise on balls of feet. Repeat __10__ times per set. Do __1__ sets per session. Do _1___ sessions per day.    Heel Raise: Unilateral (Standing)    Balance on left foot, then rise on ball of foot. Repeat __10__ times per set. Do __1__ sets per session. Do _1___ sessions per day.  http://[email or web address].exer.[email or web address]   Copyright  VHI. All rights reserved.

## 2021-08-20 NOTE — Therapy (Signed)
[facility] [address] [place], [place], [address] Phone: [phone]   Fax:  [phone]  Physical Therapy Treatment & Discharge Summary  Patient Details  Name: Hailey Perkins MRN: [id number] Date of Birth: 02-Dec-1935 Referring Provider (PT): Dr. Marcial Pacas   Encounter Date: 08/19/2021   PT End of Session - 08/20/21 1414     Visit Number 6    Number of Visits 7   eval + 6 visits   Date for PT Re-Evaluation 07/16/21    Authorization Type [employer] Medicare    Authorization Time Period 06-01-21 - 07-16-21    PT Start Time 0812   pt arrived 87" late for appt   PT Stop Time 0850    PT Time Calculation (min) 38 min    Equipment Utilized During Treatment --   [place] Walk On AFO's   Activity Tolerance Patient tolerated treatment well    Behavior During Therapy [facility] for tasks assessed/performed             Past Medical History:  Diagnosis Date   Arthritis    Cataract 2010   bilateral eyes   Chicken pox    GERD (gastroesophageal reflux disease)    Glaucoma    History of frequent urinary tract infections    Hyperlipidemia    Osteopenia    Peripheral neuropathy    Sleep apnea    wears a CPAP   Urine incontinence     Past Surgical History:  Procedure Laterality Date   ABDOMINAL HYSTERECTOMY  1977   ANAL FISTULECTOMY  1985   ANKLE FRACTURE SURGERY Right 2014   APPENDECTOMY     BIOPSY  02/15/2021   Procedure: BIOPSY;  Surgeon: Irving Copas., MD;  Location: [facility];  Service: Gastroenterology;;   BREAST BIOPSY Bilateral 10+ yrs ago   BENIGN   BUNIONECTOMY Left 1975   Left Toe   CATARACT EXTRACTION, BILATERAL  2009   COLONOSCOPY WITH PROPOFOL N/A 02/15/2021   Procedure: COLONOSCOPY WITH PROPOFOL;  Surgeon: Irving Copas., MD;  Location: [facility];  Service: Gastroenterology;  Laterality: N/A;  ultraslimscope    DILATION AND CURETTAGE OF UTERUS  1972   eye [place] vision correction Left  10/09/2013   eye socket re-formed Left 2014   FOOT NEUROMA SURGERY Right 2003   plus bunionectomy, right great toe   HAMMER TOE SURGERY Right 2005   Right foot correction hammertoe little toe   HEMORRHOID SURGERY  1967   HIP SURGERY Right 2014   right hip femur pin implanted   KNEE ARTHROSCOPY Left 2010   debridement left knee scar tissue   LAPAROSCOPIC OOPHERECTOMY     1986, [place] Right 2003   Right Foot Neuroma, plus Bunionectomy, Right Great Toe   POLYPECTOMY  02/15/2021   Procedure: POLYPECTOMY;  Surgeon: Mansouraty, Telford Nab., MD;  Location: [facility];  Service: Gastroenterology;;   [place] Left 01/2008   REPLACEMENT TOTAL KNEE Right 08/2008   retinal pucker correction Left 07/14/2012   schwannoma tumor (benign) removal  2011   right rib   [place]    There were no vitals filed for this visit.   Subjective Assessment - 08/19/21 0813     Subjective Pt reports she fell down at her cousin's house on Sunday - forgot that there  was a 2nd step; was holding a bag and states that the bag "broke my fall"- sustained some bruises on right arm but no major injury; pt states she feels that she is walking better overall    Pertinent History peripheral neuropathy; osteopenia; cervical disc disease with radiculopathy; arthritis    Diagnostic tests MRI - was done in March 2022    Patient Stated Goals work on strengthening ankles    Currently in Pain? No/denies    Pain Onset More than a month ago   approx. 6 months ago                              [facility] Adult PT Treatment/Exercise - 08/20/21 0001       Ambulation/Gait   Ambulation/Gait Yes    Ambulation/Gait Assistance 5: Supervision    Ambulation/Gait Assistance Details no device used in today's session    Ambulation Distance (Feet) 100 Feet    Assistive device  None    Gait Pattern Step-through pattern    Ambulation Surface Level;Indoor      Standardized Balance Assessment   Standardized Balance Assessment Timed Up and Go Test      Timed Up and Go Test   TUG Normal TUG    Normal TUG (seconds) 11.47      Exercises   Exercises Ankle      Knee/Hip Exercises: Stretches   Active Hamstring Stretch Right;Left;1 rep;30 seconds   Runner's stretch     Knee/Hip Exercises: Standing   Heel Raises Both;1 set;10 reps    Heel Raises Limitations LLE gastroc is weaker than RLE    Forward Step Up Right;Left;1 set;10 reps;Hand Hold: 2;Step Height: 6"    Other Standing Knee Exercises tap ups alternating feet to 1st step for improved SLS on each leg      Ankle Exercises: Seated   Toe Raise 10 reps   with yellow theraband in seated position   Other Seated Ankle Exercises pt performed dorsiflexion in standing without resistance 10 reps each leg    Other Seated Ankle Exercises walked 10' along counter on tip toes and then on heels for ankle strengthening - with UE support for safety                     PT Education - 08/20/21 1413     Education Details see pt instructions    Person(s) Educated Patient    Methods Explanation;Demonstration;Handout    Comprehension Verbalized understanding;Returned demonstration              PT Short Term Goals - 08/20/21 1415       PT SHORT TERM GOAL #1   Title Trial foot up brace and AFO's for increased safety with gait due to bil. foot drop.    Baseline met 06-22-21    Time 3    Period Weeks    Status Achieved    Target Date 06/25/21      PT SHORT TERM GOAL #2   Title Initiate HEP for bil. ankle strengthening and heel cord stretching.    Baseline met 06-22-21    Time 3    Period Weeks    Status Achieved    Target Date 06/25/21               PT Long Term Goals - 08/19/21 0815       PT LONG TERM GOAL #1   Title Independent in  HEP for bil. dorsiflexor strengthening and heel cord  stretching.    Baseline HEP updated on 07-12-21; updated to include resisted dorsiflexion with yellow theraband on 08-19-21    Time 6    Period Weeks    Status Achieved      PT LONG TERM GOAL #2   Title Pt will verbalize understanding of options for orthotics including AFO and foot up brace for incr. safety with gait due to bil. foot drop.    Baseline met 07-12-21 -- hold on AFO's at this time due to increased strength in Bil. dorsiflexors - will cont to assess    Time 6    Period Weeks    Status Achieved      PT LONG TERM GOAL #3   Title Improve TUG score from 13.63 secs to </= 12.5 secs without device for reduced. fall risk.    Baseline 13.63 secs no device;   07-12-21 - 13.68 secs without device;  11.37 secs with poles;  11.41 - goal met 08-19-21    Time 6    Period Weeks    Status Achieved      PT LONG TERM GOAL #4   Title Independent in updated HEP as appropriate.    Baseline met 08-19-21    Status Achieved                   Plan - 08/20/21 1418     Clinical Impression Statement Pt has met 4/4 LTG's with pt demonstrating improvement in bil. dorsiflexor strength with very minimal to no foot slap occurring during session on 08-19-21 and foot clearance achieved in swing phase of gait.  Pt reports she has ordered walking poles but did not use a device in today's session.  Session focused on updating & progressing pt's HEP for land and aquatic exercises to improve balance and bil. ankle musc. strength.  Pt is discharged due to goals met at this time.  Pt agrees with D/C.    Personal Factors and Comorbidities Comorbidity 1;Time since onset of injury/illness/exacerbation    Comorbidities arthritis, peripheral neuropathy, osteopenia    Examination-Activity Limitations Locomotion Level;Transfers;Squat;Stairs    Examination-Participation Restrictions Cleaning;Laundry;Shop;Meal Prep    Stability/Clinical Decision Making Stable/Uncomplicated    Rehab Potential Good    PT Frequency 1x /  week    PT Duration 6 weeks    PT Treatment/Interventions ADLs/Self Care Home Management;Gait training;Stair training;Therapeutic activities;Therapeutic exercise;DME Instruction;Balance training;Neuromuscular re-education;Patient/family education;Orthotic Fit/Training    PT Next Visit Plan D/C due to goals met & program completed.    PT Home Exercise Plan heel cord stretching and heel raises    Consulted and Agree with Plan of Care Patient             Patient will benefit from skilled therapeutic intervention in order to improve the following deficits and impairments:  Abnormal gait, Decreased balance, Decreased strength, Impaired flexibility  Visit Diagnosis: Muscle weakness (generalized)  Other abnormalities of gait and mobility  Unsteadiness on feet     Problem List Patient Active Problem List   Diagnosis Date Noted   Chronic pain of right knee 05/27/2021   OSA on CPAP 05/27/2021   LLQ pain 12/16/2020   History of diverticulitis 12/16/2020   Hematochezia 12/16/2020   Abnormal colonoscopy 12/16/2020   Diverticulitis of colon 08/15/2020   Sleep-related hypoxia 08/05/2019   OSA (obstructive sleep apnea) 08/05/2019   PLMD (periodic limb movement disorder) 08/05/2019   Cerebellar 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 ([place]) 05/06/2019  Excessive daytime sleepiness 05/06/2019   Loud snoring 05/06/2019   Ventral hernia without obstruction or gangrene 02/22/2019   Chronic throat clearing 01/18/2019   Nocturnal cough 01/18/2019   Family history of colon cancer in father 01/18/2019   Diverticulosis 01/18/2019   Gait abnormality 12/27/2018   Numbness 12/27/2018   Status post total bilateral knee replacement 11/30/2018   DDD (degenerative disc disease), cervical 11/30/2018   Status post carpal tunnel release 11/30/2018   DDD (degenerative disc disease), lumbar 10/26/2018   Primary osteoarthritis of both hands 10/26/2018   Primary osteoarthritis of both feet 10/26/2018    Gastroesophageal reflux disease 08/27/2018   Bilateral impacted cerumen 08/21/2018   Sensorineural hearing loss (SNHL) of both ears 08/21/2018   Tinnitus, bilateral 08/21/2018   Blood in urine 04/17/2018   Secondary open-angle glaucoma of left eye, moderate stage 03/10/2018   Early stage 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 10/05/2017   Epiretinal membrane (ERM) of left eye 10/05/2017   Pseudophakia of both eyes 10/05/2017   Osteopenia 03/19/2017   Mixed hyperlipidemia 08/18/2016   Generalized osteoarthritis of multiple sites 08/18/2016   Peripheral neuropathic pain 08/18/2016   Overactive bladder 08/18/2016   Glaucoma 08/18/2016   Cervical disc disorder with radiculopathy of cervical region 08/17/2016   Carpal tunnel syndrome, right upper limb 08/17/2016   Abnormal finding on mammography 06/14/2016   Breast lump 05/06/2015      PHYSICAL THERAPY DISCHARGE SUMMARY  Visits from Start of Care: 6  Current functional level related to goals / functional outcomes: See above for progress towards goals - all goals met   Remaining deficits: Continued bil. Ankle dorsiflexor & plantarflexor weakness but strength has improved since time of initial eval in June 2022 - pt declined orthoses at that time and now strength has improved sufficiently that AFO's not needed at current time   Education / Equipment: Pt has been instructed in HEP for ankle strengthening and also for balance exercises; included exercises that pt can continue to do in pool as she attends water exercise at [facility] 3x/week  Patient agrees to discharge. Patient goals were met. Patient is being discharged due to meeting the stated rehab goals.    Hailey Perkins, PT 08/20/2021, 2:24 PM  [facility] [address] [place], [place], [address] Phone: [phone]   Fax:  [phone]  Name: Hailey Perkins MRN: [id number] Date of Birth:  [birth year]

## 2021-09-16 ENCOUNTER — Encounter: Payer: Self-pay | Admitting: Rheumatology

## 2021-09-17 ENCOUNTER — Telehealth: Payer: Self-pay

## 2021-09-17 NOTE — Telephone Encounter (Signed)
Ok to provide prescription/order for these splints.

## 2021-09-17 NOTE — Telephone Encounter (Signed)
Betsy from [facility] called stating patient's physical therapist would like to order bilateral soft comfort cool splints.  These splints are pre-fab and off the shelf.   Please call back at #[phone]

## 2021-09-17 NOTE — Telephone Encounter (Signed)
RX faxed
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
## 2021-09-20 NOTE — Patient Instructions (Addendum)
Please continue using your CPAP regularly. While your insurance requires that you use CPAP at least 4 hours each night on 70% of the nights, I recommend, that you not skip any nights and use it throughout the night if you can. Getting used to CPAP and staying with the treatment long term does take time and patience and discipline. Untreated obstructive sleep apnea when it is moderate to severe can have an adverse impact on cardiovascular health and raise her risk for heart disease, arrhythmias, hypertension, congestive heart failure, stroke and diabetes. Untreated obstructive sleep apnea causes sleep disruption, nonrestorative sleep, and sleep deprivation. This can have an impact on your day to day functioning and cause daytime sleepiness and impairment of cognitive function, memory loss, mood disturbance, and problems focussing. Using CPAP regularly can improve these symptoms.  I will send mask refitting order to [facility]. Please let me know if you have any questions or concerns.     DME - [facility] - [phone]

## 2021-09-21 ENCOUNTER — Encounter: Payer: Self-pay | Admitting: Family Medicine

## 2021-09-21 ENCOUNTER — Other Ambulatory Visit: Payer: Self-pay

## 2021-09-21 ENCOUNTER — Ambulatory Visit: Payer: Medicare Other | Admitting: Family Medicine

## 2021-09-21 VITALS — BP 143/87 | HR 81 | Ht 63.5 in | Wt 167.0 lb

## 2021-09-21 DIAGNOSIS — G4733 Obstructive sleep apnea (adult) (pediatric): Secondary | ICD-10-CM | POA: Diagnosis not present

## 2021-09-21 DIAGNOSIS — Z9989 Dependence on other enabling machines and devices: Secondary | ICD-10-CM

## 2021-09-27 ENCOUNTER — Other Ambulatory Visit: Payer: Self-pay | Admitting: Internal Medicine

## 2021-09-27 DIAGNOSIS — Z1231 Encounter for screening mammogram for malignant neoplasm of breast: Secondary | ICD-10-CM

## 2021-09-30 ENCOUNTER — Encounter: Payer: Self-pay | Admitting: Sports Medicine

## 2021-09-30 ENCOUNTER — Ambulatory Visit: Payer: Medicare Other | Admitting: Sports Medicine

## 2021-09-30 ENCOUNTER — Ambulatory Visit (INDEPENDENT_AMBULATORY_CARE_PROVIDER_SITE_OTHER): Payer: Medicare Other

## 2021-09-30 ENCOUNTER — Other Ambulatory Visit: Payer: Self-pay

## 2021-09-30 DIAGNOSIS — M2041 Other hammer toe(s) (acquired), right foot: Secondary | ICD-10-CM | POA: Diagnosis not present

## 2021-09-30 DIAGNOSIS — M79672 Pain in left foot: Secondary | ICD-10-CM

## 2021-09-30 DIAGNOSIS — M79671 Pain in right foot: Secondary | ICD-10-CM

## 2021-09-30 DIAGNOSIS — L600 Ingrowing nail: Secondary | ICD-10-CM | POA: Diagnosis not present

## 2021-09-30 DIAGNOSIS — L608 Other nail disorders: Secondary | ICD-10-CM

## 2021-09-30 MED ORDER — NEOMYCIN-POLYMYXIN-HC 3.5-10000-1 OT SOLN: OTIC | 0 refills | Status: DC

## 2021-09-30 NOTE — Progress Notes (Signed)
Subjective: Hailey Perkins is a 86 y.o. female patient seen today in office with complaint of hammertoes that are rubbing in her shoes especially on the right foot at the second and third toes.  Patient reports that her hammertoes have progressively gotten worse over time and is wondering what more can she do about it.  Patient currently uses new balance tennis shoes but states that she still gets some rubbing around the toes.  Patient also reports that she has noticed her nails getting slightly discolored and hard but also states that she has issues where the fourth toes continue to get sore and ingrown but denies any swelling redness drainage or any other pedal complaints at this time.  Patient Active Problem List   Diagnosis Date Noted   Chronic pain of right knee 05/27/2021   OSA on CPAP 05/27/2021   LLQ pain 12/16/2020   History of diverticulitis 12/16/2020   Hematochezia 12/16/2020   Abnormal colonoscopy 12/16/2020   Diverticulitis of colon 08/15/2020   Sleep-related hypoxia 08/05/2019   OSA (obstructive sleep apnea) 08/05/2019   PLMD (periodic limb movement disorder) 08/05/2019   Cerebellar 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) 05/06/2019   Excessive daytime sleepiness 05/06/2019   Loud snoring 05/06/2019   Ventral hernia without obstruction or gangrene 02/22/2019   Chronic throat clearing 01/18/2019   Nocturnal cough 01/18/2019   Family history of colon cancer in father 01/18/2019   Diverticulosis 01/18/2019   Gait abnormality 12/27/2018   Numbness 12/27/2018   Status post total bilateral knee replacement 11/30/2018   DDD (degenerative disc disease), cervical 11/30/2018   Status post carpal tunnel release 11/30/2018   DDD (degenerative disc disease), lumbar 10/26/2018   Primary osteoarthritis of both hands 10/26/2018   Primary osteoarthritis of both feet 10/26/2018   Gastroesophageal reflux disease 08/27/2018   Bilateral impacted cerumen 08/21/2018   Sensorineural  hearing loss (SNHL) of both ears 08/21/2018   Tinnitus, bilateral 08/21/2018   Blood in urine 04/17/2018   Secondary open-angle glaucoma of left eye, moderate stage 03/10/2018   Early stage 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 10/05/2017   Epiretinal membrane (ERM) of left eye 10/05/2017   Pseudophakia of both eyes 10/05/2017   Osteopenia 03/19/2017   Mixed hyperlipidemia 08/18/2016   Generalized osteoarthritis of multiple sites 08/18/2016   Peripheral neuropathic pain 08/18/2016   Overactive bladder 08/18/2016   Glaucoma 08/18/2016   Cervical disc disorder with radiculopathy of cervical region 08/17/2016   Carpal tunnel syndrome, right upper limb 08/17/2016   Abnormal finding on mammography 06/14/2016   Breast lump 05/06/2015    Current Outpatient Medications on File Prior to Visit  Medication Sig Dispense Refill   Alpha-Lipoic Acid 300 MG TABS Take 300 mg by mouth daily.     Ascorbic Acid (VITAMIN C) 1000 MG tablet Take 1,000 mg by mouth daily.     aspirin EC 81 MG tablet Take 81 mg by mouth daily. Swallow whole.     betamethasone dipropionate 0.05 % lotion Apply 1 application topically daily as needed (scalp irritation).  0   Biotin 10 MG TABS Take 10 mg by mouth daily.     Calcium Citrate-Vitamin D (CALCIUM + D PO) Take 2 tablets by mouth daily.     celecoxib (CELEBREX) 200 MG capsule Take 200 mg by mouth daily.     Cholecalciferol (VITAMIN D3) 2000 units TABS Take 2,000 Units by mouth daily.     diclofenac Sodium (VOLTAREN) 1 % GEL Apply 1 application topically 3 (  three) times daily as needed (pain).     dorzolamide (TRUSOPT) 2 % ophthalmic solution Place 1 drop into both eyes 2 (two) times daily.     DULoxetine (CYMBALTA) 30 MG capsule TAKE 1 CAPSULE(30 MG) BY MOUTH DAILY (Patient taking differently: Take 30 mg by mouth daily.) 90 capsule 2   Ginger, Zingiber officinalis, (GINGER ROOT) 550 MG CAPS Take 550 mg by mouth in the morning, at noon, and at  bedtime.     GLUCOSAMINE-CHONDROITIN-MSM PO Take 2 capsules by mouth daily. 1500 mg Glucosamine, 750 mg MSM     Magnesium 250 MG TABS Take 250 mg by mouth daily.     metroNIDAZOLE (METROGEL) 0.75 % gel Apply 1 application topically as needed (rosacea).     modafinil (PROVIGIL) 100 MG tablet Take 100 mg by mouth daily.     Multiple Vitamins-Minerals (PRESERVISION AREDS PO) Take 1 capsule by mouth in the morning and at bedtime.     Omega-3 Fatty Acids (FISH OIL) 1000 MG CAPS Take 1,000 mg by mouth daily.     pantoprazole (PROTONIX) 40 MG tablet Take 40 mg by mouth daily.     Plant Sterols and Stanols (CHOLESTOFF PLUS PO) Take 2 capsules by mouth in the morning and at bedtime.     polyethylene glycol powder (GLYCOLAX/MIRALAX) 17 GM/SCOOP powder Take 17 g by mouth daily as needed for moderate constipation.     Sodium Fluoride 1.1 % PSTE Place 1 application onto teeth daily.     TURMERIC CURCUMIN PO Take 2 capsules by mouth daily.     vitamin B-12 (CYANOCOBALAMIN) 1000 MCG tablet Take 1,000 mcg by mouth daily.     No current facility-administered medications on file prior to visit.    Allergies  Allergen Reactions   Tape Other (See Comments)    Adhesive Tape; pt stated "itch and redness" Adhesive Tape; pt stated "itch and redness"    Objective: Physical Exam  General: Well developed, nourished, no acute distress, awake, alert and oriented x 3  Vascular: Dorsalis pedis artery 1/4 bilateral, Posterior tibial artery 1/4 bilateral, skin temperature warm to warm proximal to distal bilateral lower extremities, no varicosities, scant pedal hair present bilateral.  Neurological: Gross sensation present via light touch bilateral.   Dermatological: Skin is warm, dry, and supple bilateral, nails to appear to be within normal limits with minimal yellow discoloration, there is no acute ingrowing noted at bilateral fourth toes but mechanically before if toes do plantarflexed and are varus rotated so  that could add to increased pressure to her toenails due to hammertoe deformity,  No signs of infection bilateral.  Musculoskeletal: + Bunion and hammertoe boney deformities noted bilateral. Muscular strength within normal limits without pain on range of motion. No pain with calf compression bilateral.  Assessment and Plan:  Problem List Items Addressed This Visit   None Visit Diagnoses     Hammer toe of right foot    -  Primary   Relevant Orders   DG Foot Complete Right   Foot pain, bilateral       Ingrown nail       Nail discoloration          -Examined patient -Discussed treatment options for hammertoes, none acute ingrowing right and left fourth toes, and slight nail discoloration -Dispensed hammertoe regulator for patient to use as directed -Advised good supportive shoes that do not rub toes, she was provided -Advised patient to try vinegar soaking and tea tree oil, [place] old, all available  or vitamin E oil to her nails once weekly -At no additional charge mechanically debrided bilateral fourth toenails with sterile nail nipper without incident and advised patient that I did not see any acute ingrowing at this time however due to the rotation of her fourth toes and the mechanical pressure likely this can add to pain around the toenail for preventative reasons I did prescribe Corticosporin solution for patient to use 1 to 2 drops to affected toes after bath or shower for the next 2 weeks -Patient to return in 4 weeks for follow up evaluation and discussion of fungal culture results or sooner if symptoms worsen.  Asencion Islam, DPM

## 2021-09-30 NOTE — Patient Instructions (Signed)
Vinegar soaks 1 cup of white distilled vinegar to 8 cups of warm water.  Soak 20 mins. May repeat soak two times per week.  If there is thickness to nails may file nails after soaks or after bath/shower with nail file and apply tea tree oil. Apply oil daily to nails after filing for the best result.  Almond oil, vitamin E oil, olive oil may be used as well   Insurance claims handler For tennis shoes recommend:  [employer] Asics New balance Saucony HOKA Can be purchased at [employer] sports or [employer] arch fit Can be purchased at any major retailers  Vionic  SAS Can be purchased at [employer] or [employer]   For work shoes recommend: [employer] Can be purchased at a variety of places or [employer]   For casual shoes recommend: Oofos Can be purchased at [employer] sports or [employer]  Can be purchased at [employer] or [employer]  For [employer] recommend: Power Steps Can be purchased in office/[facility] [employer] Can be purchased at [employer] sports or [employer] Can be purchased at [employer] For [employer] & Co recommend: Therapist, sports at [employer] and [employer]

## 2021-10-19 ENCOUNTER — Ambulatory Visit
Admission: RE | Admit: 2021-10-19 | Discharge: 2021-10-19 | Disposition: A | Discharge status: Home / Self Care | Payer: Medicare Other | Source: Ambulatory Visit | Attending: Internal Medicine | Admitting: Internal Medicine

## 2021-10-19 ENCOUNTER — Other Ambulatory Visit: Payer: Self-pay

## 2021-10-19 DIAGNOSIS — Z1231 Encounter for screening mammogram for malignant neoplasm of breast: Secondary | ICD-10-CM

## 2021-10-19 IMAGING — MG MM DIGITAL SCREENING BILAT W/ TOMO AND CAD
6 of 10 series · 6 of 30 positions shown · non-contrast
Comparison: Previous exam(s).

CLINICAL DATA: Screening.

EXAM:
DIGITAL SCREENING BILATERAL MAMMOGRAM WITH TOMOSYNTHESIS AND CAD
TECHNIQUE: Bilateral screening digital craniocaudal and mediolateral oblique
mammograms were obtained. Bilateral screening digital breast
tomosynthesis was performed. The images were evaluated with
computer-aided detection.

[L CC synth-2D]
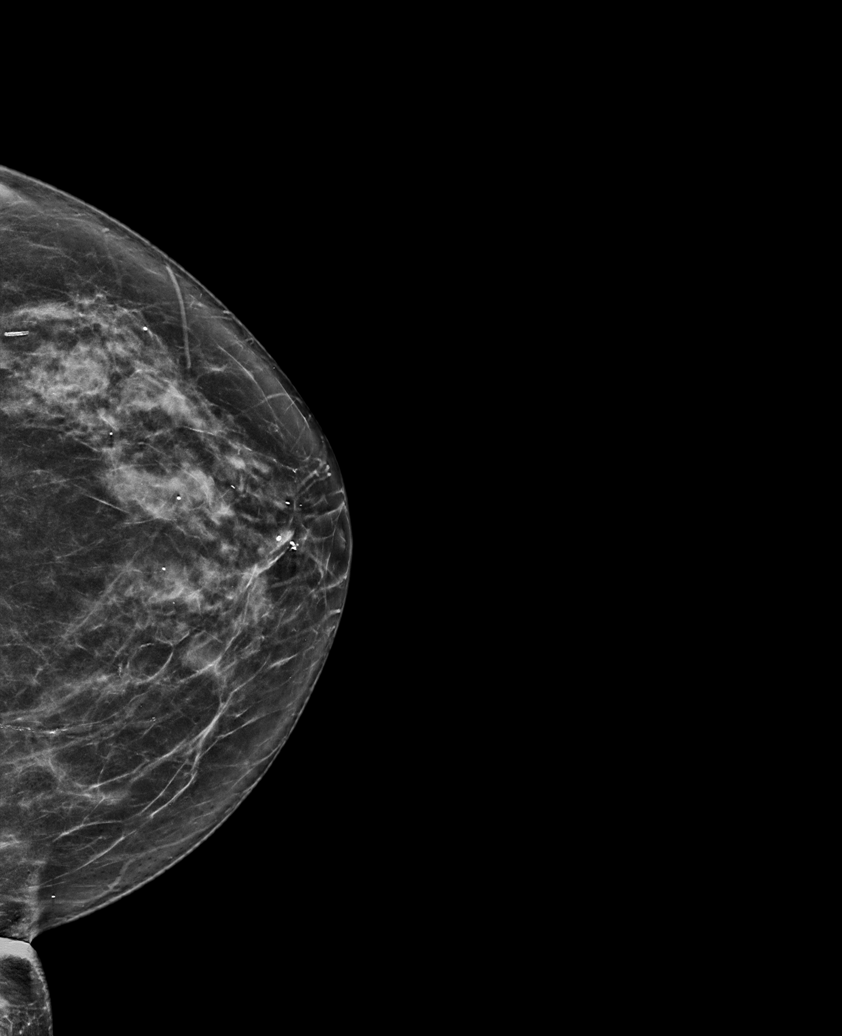

[R MLO synth-2D]
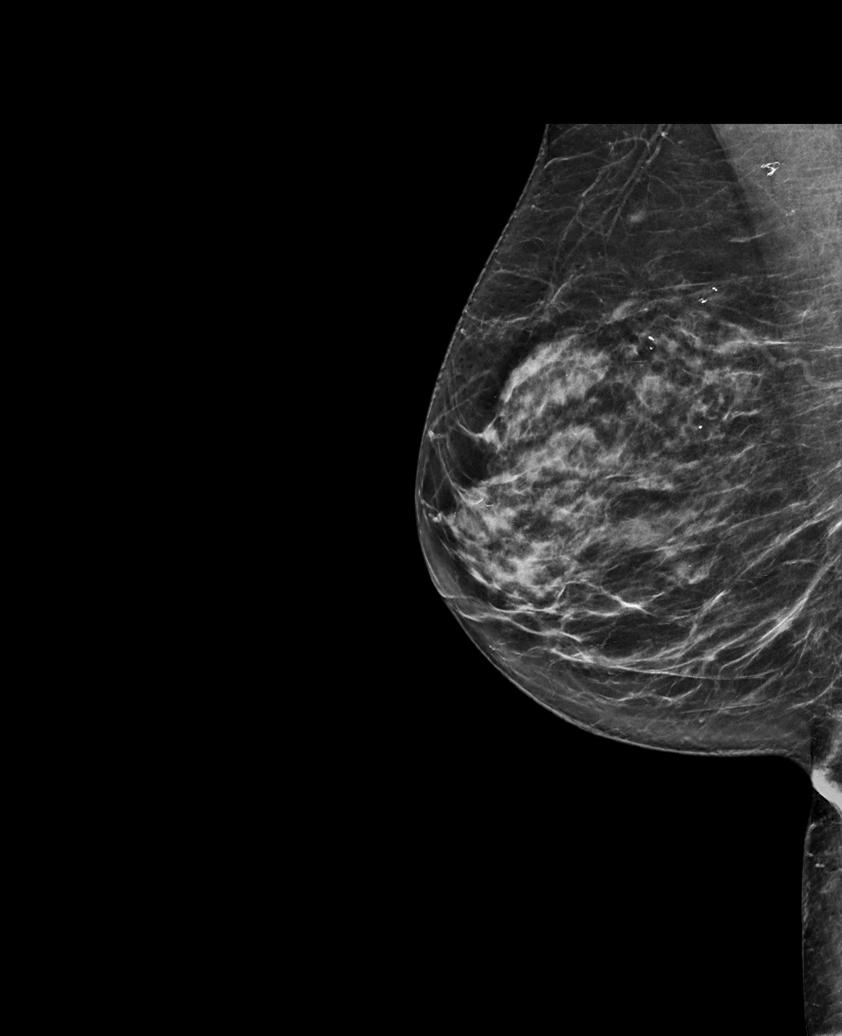

[L XCCL synth-2D]
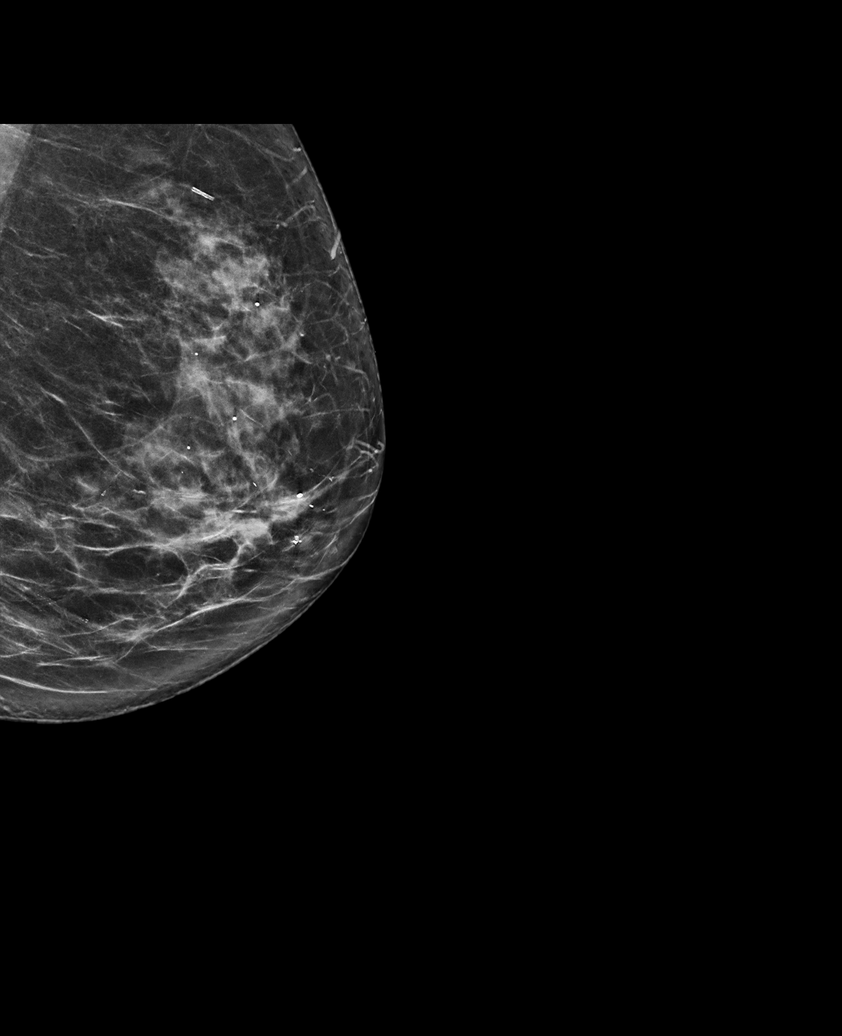

[R CC synth-2D]
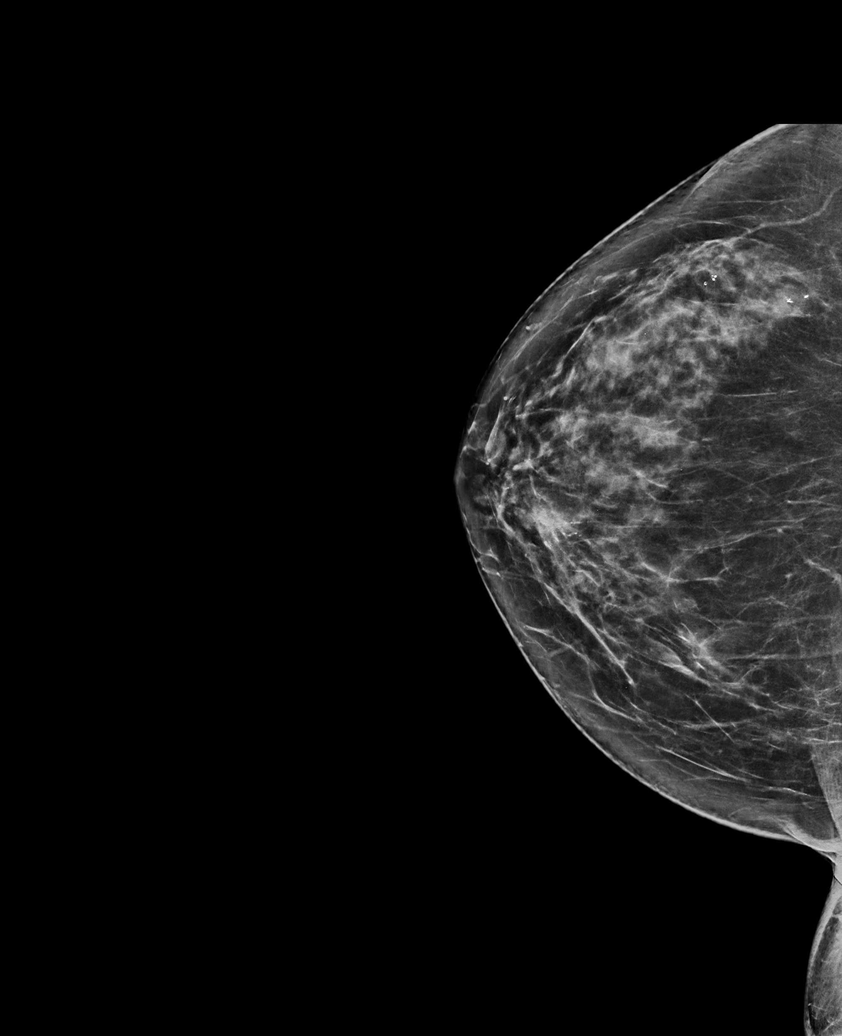

[L MLO synth-2D]
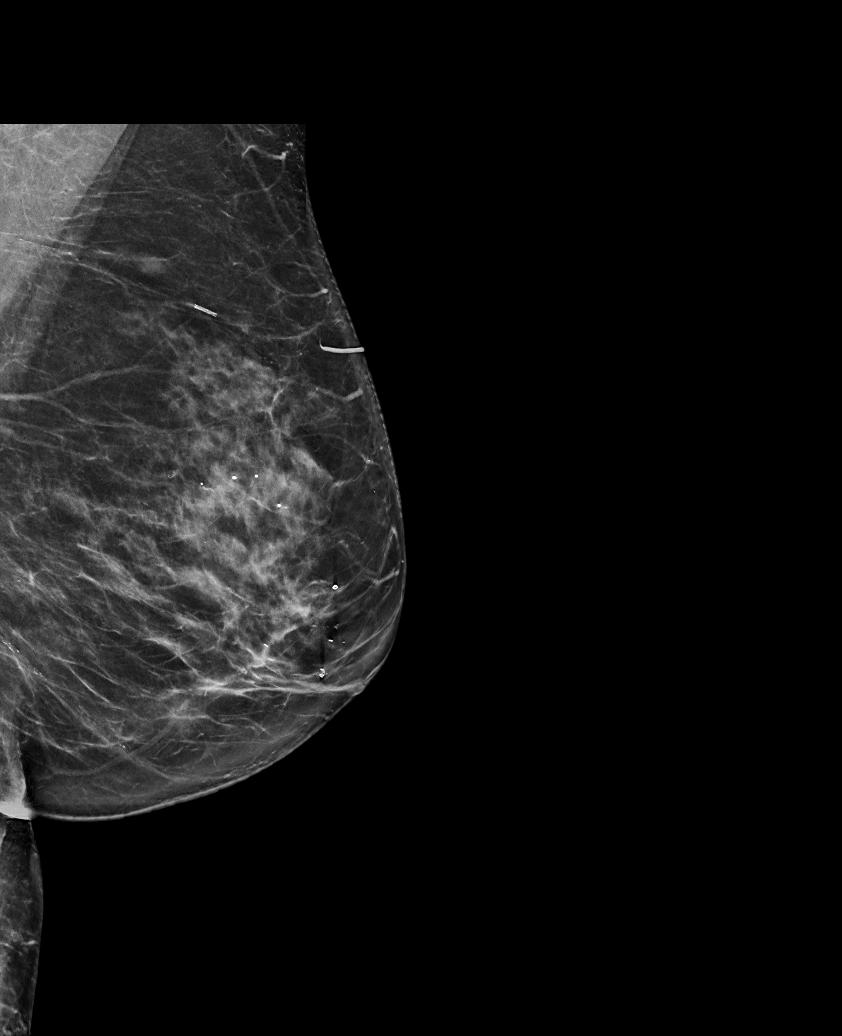

[L MLO tomo · tomo slice 39/78.0]
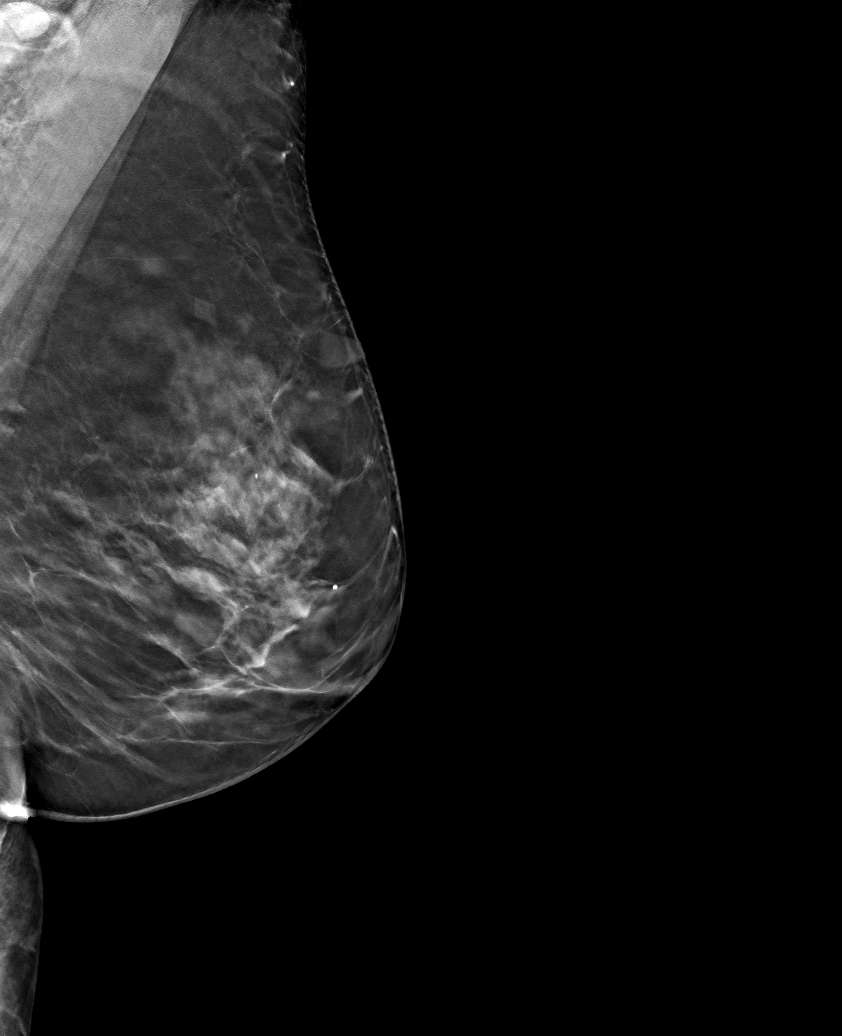

[6 of 30 positions shown; findings below may reference images not displayed]

ACR Breast Density Category c: The breast tissue is heterogeneously
dense, which may obscure small masses.
FINDINGS: There are no findings suspicious for malignancy.
IMPRESSION: No mammographic evidence of malignancy. A result letter of this
screening mammogram will be mailed directly to the patient.

RECOMMENDATION:
Screening mammogram in one year. (Code:[V2])

BI-RADS CATEGORY  1: Negative.

## 2021-10-20 NOTE — Progress Notes (Signed)
Office Visit Note  Patient: Hailey Perkins             Date of Birth: [birth year]           MRN: [id number]             PCP: Leanna Battles, MD Referring: Leanna Battles, MD Visit Date: 11/03/2021 Occupation: @GUAROCC @  Subjective:  Pain in both hands   History of Present Illness: Hailey Perkins is a 86 y.o. female with history of osteoarthritis and DDD.  Patient reports that she continues to have chronic pain and stiffness in both hands.  She went to hand therapy after her last office visit and noted significant improvement.  She continues to perform hand exercises at home on a daily basis.  She has also been wearing CMC joint braces as well as arthritis compression gloves on a daily basis.  She is taking Celebrex 200 mg 1 capsule daily for pain relief and uses Voltaren gel 3 times daily as needed for pain relief.  She has been trying to increase her exercise regimen and has been going to water aerobics as well as walking at [place] twice weekly.  She continues to have some fluid retention in both lower extremities and has been wearing compression stockings daily.    Activities of Daily Living:  Patient reports morning stiffness for 10-15 minutes.   Patient Denies nocturnal pain.  Difficulty dressing/grooming: Reports Difficulty climbing stairs: Reports Difficulty getting out of chair: Reports Difficulty using hands for taps, buttons, cutlery, and/or writing: Reports  Review of Systems  Constitutional:  Negative for fatigue.  HENT:  Positive for mouth dryness. Negative for mouth sores and nose dryness.   Eyes:  Positive for dryness. Negative for pain and itching.  Respiratory:  Negative for shortness of breath and difficulty breathing.   Cardiovascular:  Negative for chest pain and palpitations.  Gastrointestinal:  Positive for diarrhea. Negative for blood in stool and constipation.  Endocrine: Negative for increased urination.  Genitourinary:  Negative for difficulty  urinating.  Musculoskeletal:  Positive for joint pain, joint pain, joint swelling, myalgias, morning stiffness, muscle tenderness and myalgias.  Skin:  Negative for color change, rash and redness.  Allergic/Immunologic: Negative for susceptible to infections.  Neurological:  Positive for numbness and headaches. Negative for dizziness, memory loss and weakness.  Hematological:  Negative for bruising/bleeding tendency.  Psychiatric/Behavioral:  Negative for confusion.    PMFS History:  Patient Active Problem List   Diagnosis Date Noted   Chronic pain of right knee 05/27/2021   OSA on CPAP 05/27/2021   LLQ pain 12/16/2020   History of diverticulitis 12/16/2020   Hematochezia 12/16/2020   Abnormal colonoscopy 12/16/2020   Diverticulitis of colon 08/15/2020   Sleep-related hypoxia 08/05/2019   OSA (obstructive sleep apnea) 08/05/2019   PLMD (periodic limb movement disorder) 08/05/2019   Cerebellar 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 ([place]) 05/06/2019   Excessive daytime sleepiness 05/06/2019   Loud snoring 05/06/2019   Ventral hernia without obstruction or gangrene 02/22/2019   Chronic throat clearing 01/18/2019   Nocturnal cough 01/18/2019   Family history of colon cancer in father 01/18/2019   Diverticulosis 01/18/2019   Gait abnormality 12/27/2018   Numbness 12/27/2018   Status post total bilateral knee replacement 11/30/2018   DDD (degenerative disc disease), cervical 11/30/2018   Status post carpal tunnel release 11/30/2018   DDD (degenerative disc disease), lumbar 10/26/2018   Primary osteoarthritis of both hands 10/26/2018   Primary osteoarthritis of both feet 10/26/2018  Gastroesophageal reflux disease 08/27/2018   Bilateral impacted cerumen 08/21/2018   Sensorineural hearing loss (SNHL) of both ears 08/21/2018   Tinnitus, bilateral 08/21/2018   Blood in urine 04/17/2018   Secondary open-angle glaucoma of left eye, moderate stage 03/10/2018   Early stage nonexudative  age-related macular degeneration of both eyes 10/05/2017   Epiretinal membrane (ERM) of left eye 10/05/2017   Pseudophakia of both eyes 10/05/2017   Osteopenia 03/19/2017   Mixed hyperlipidemia 08/18/2016   Generalized osteoarthritis of multiple sites 08/18/2016   Peripheral neuropathic pain 08/18/2016   Overactive bladder 08/18/2016   Glaucoma 08/18/2016   Cervical disc disorder with radiculopathy of cervical region 08/17/2016   Carpal tunnel syndrome, right upper limb 08/17/2016   Abnormal finding on mammography 06/14/2016   Breast lump 05/06/2015    Past Medical History:  Diagnosis Date   Arthritis    Cataract 2010   bilateral eyes   Chicken pox    GERD (gastroesophageal reflux disease)    Glaucoma    History of frequent urinary tract infections    Hyperlipidemia    Osteopenia    Peripheral neuropathy    Sleep apnea    wears a CPAP   Urine incontinence     Family History  Problem Relation Age of Onset   Hyperlipidemia Mother    Heart disease Mother    Hypertension Mother    Cancer Father    Colon cancer Father    Alcohol abuse Brother    Drug abuse Brother    Cancer Brother    Cancer Maternal Aunt    Breast cancer Maternal Aunt    Colon cancer Maternal Grandmother    Brain cancer Son    Autoimmune disease Daughter        Behcet's   Esophageal cancer Neg Hx    Inflammatory bowel disease Neg Hx    Liver disease Neg Hx    Pancreatic cancer Neg Hx    Rectal cancer Neg Hx    Stomach cancer Neg Hx    Past Surgical History:  Procedure Laterality Date   ABDOMINAL HYSTERECTOMY  1977   ANAL FISTULECTOMY  1985   ANKLE FRACTURE SURGERY Right 2014   APPENDECTOMY     BIOPSY  02/15/2021   Procedure: BIOPSY;  Surgeon: Meridee Score, Netty Starring., MD;  Location: [facility];  Service: Gastroenterology;;   BREAST BIOPSY Bilateral 10+ yrs ago   BENIGN   BUNIONECTOMY Left 1975   Left Toe   CATARACT EXTRACTION, BILATERAL  2009   COLONOSCOPY WITH PROPOFOL N/A 02/15/2021    Procedure: COLONOSCOPY WITH PROPOFOL;  Surgeon: Lemar Lofty., MD;  Location: [facility];  Service: Gastroenterology;  Laterality: N/A;  ultraslimscope    DILATION AND CURETTAGE OF UTERUS  1972   eye PRK vision correction Left 10/09/2013   eye socket re-formed Left 2014   FOOT NEUROMA SURGERY Right 2003   plus bunionectomy, right great toe   HAMMER TOE SURGERY Right 2005   Right foot correction hammertoe little toe   HEMORRHOID SURGERY  1967   HIP SURGERY Right 2014   right hip femur pin implanted   KNEE ARTHROSCOPY Left 2010   debridement left knee scar tissue   LAPAROSCOPIC OOPHERECTOMY     1986, 1990   LASIK  1998   NEUROMA SURGERY Right 2003   Right Foot Neuroma, plus Bunionectomy, Right Great Toe   POLYPECTOMY  02/15/2021   Procedure: POLYPECTOMY;  Surgeon: Mansouraty, Netty Starring., MD;  Location: [facility];  Service: Gastroenterology;;  [place]   REPLACEMENT TOTAL KNEE Left 01/2008   REPLACEMENT TOTAL KNEE Right 08/2008   retinal pucker correction Left 07/14/2012   schwannoma tumor (benign) removal  2011   right rib   [place]   Social History   Social History Narrative   Lives alone but her daughter lives next door.   Right-handed.   Occasional caffeine.   Immunization History  Administered Date(s) Administered   Influenza, High Dose Seasonal PF 08/18/2016, 08/25/2017   Influenza-Unspecified 08/19/2014   PFIZER(Purple Top)SARS-COV-2 Vaccination 12/14/2019, 01/04/2020, 09/15/2020, 07/05/2021   Pneumococcal Conjugate-13 06/03/2014   Pneumococcal Polysaccharide-23 05/25/2015   Tdap 02/26/2015   Zoster Recombinat (Shingrix) 07/04/2018, 08/17/2018   Zoster, Live 06/29/2011     Objective: Vital Signs: There were no vitals taken for this visit.   Physical Exam Vitals and nursing note reviewed.  Constitutional:      Appearance: She is well-developed.  HENT:      Head: Normocephalic and atraumatic.  Eyes:     Conjunctiva/sclera: Conjunctivae normal.  Pulmonary:     Effort: Pulmonary effort is normal.  Abdominal:     Palpations: Abdomen is soft.  Musculoskeletal:     Cervical back: Normal range of motion.  Skin:    General: Skin is warm and dry.     Capillary Refill: Capillary refill takes less than 2 seconds.  Neurological:     Mental Status: She is alert and oriented to person, place, and time.  Psychiatric:        Behavior: Behavior normal.     Musculoskeletal Exam: C-spine has good range of motion with no discomfort.  Mild postural thoracic kyphosis noted.  Shoulder joints, elbow joints, and wrist joints have good range of motion with no discomfort.  Bilateral CMC joint thickening and tenderness noted bilaterally.  PIP and DIP thickening consistent with osteoarthritis of both hands.  No tenderness or synovitis over MCP joints.  Complete fist formation bilaterally.  Hip joints have good range of motion with no groin pain.  Bilateral knee replacements have good range of motion with no warmth or effusion.  Ankle joints have good range of motion with no tenderness.  Pedal edema noted bilaterally.  CDAI Exam: CDAI Score: -- Patient Global: --; Provider Global: -- Swollen: --; Tender: -- Joint Exam 11/03/2021   No joint exam has been documented for this visit   There is currently no information documented on the homunculus. Go to the Rheumatology activity and complete the homunculus joint exam.  Investigation: No additional findings.  Imaging: DG Foot Complete Right  Result Date: 10/04/2021 Please see detailed radiograph report in office note.  MM 3D SCREEN BREAST BILATERAL  Result Date: 10/20/2021 CLINICAL DATA:  Screening. EXAM: DIGITAL SCREENING BILATERAL MAMMOGRAM WITH TOMOSYNTHESIS AND CAD TECHNIQUE: Bilateral screening digital craniocaudal and mediolateral oblique mammograms were obtained. Bilateral screening digital breast  tomosynthesis was performed. The images were evaluated with computer-aided detection. COMPARISON:  Previous exam(s). ACR Breast Density Category c: The breast tissue is heterogeneously dense, which may obscure small masses. FINDINGS: There are no findings suspicious for malignancy. IMPRESSION: No mammographic evidence of malignancy. A result letter of this screening mammogram will be mailed directly to the patient. RECOMMENDATION: Screening mammogram in one year. (Code:SM-B-01Y) BI-RADS CATEGORY  1: Negative. Electronically Signed   By: Audie Pinto M.D.   On: 10/20/2021 08:36    Recent Labs: Lab Results  Component Value  Date   WBC 9.5 12/16/2020   HGB 13.5 12/16/2020   PLT 212.0 12/16/2020   NA 138 12/16/2020   K 4.6 12/16/2020   CL 103 12/16/2020   CO2 31 12/16/2020   GLUCOSE 112 (H) 12/16/2020   BUN 11 12/16/2020   CREATININE 0.78 12/16/2020   BILITOT 0.4 08/14/2020   ALKPHOS 84 08/14/2020   AST 20 08/14/2020   ALT 16 08/14/2020   PROT 6.9 08/14/2020   ALBUMIN 4.1 08/14/2020   CALCIUM 9.6 12/16/2020    Speciality Comments: No specialty comments available.  Procedures:  No procedures performed Allergies: Tape   Assessment / Plan:     Visit Diagnoses: Primary osteoarthritis of both hands - Clinical findings are consistent with severe osteoarthritis involving her PIP/DIP and CMC joints.  She has no clinical features of rheumatoid arthritis at this time.  No tenderness or synovitis over MCP joints noted.  She was able to make a complete fist bilaterally.  She has noticed significant improvement in her hand pain and grip strength since going to hand therapy.  She has continued home exercises on a daily basis to improve her strength.  She has been using CMC joint braces on both thumbs and has been wearing arthritis compression gloves daily.  She takes Celebrex 200 mg 1 capsule by mouth daily for pain relief and uses Voltaren gel topically as needed.  Discussed the importance of joint  protection and muscle strengthening.  She was strongly encouraged to continue to perform hand exercises daily.  She was given a handout of hand exercises to perform.  Trigger index finger of right hand: She is not experiencing any triggering or tenderness at this time.  Trigger finger, right middle finger: She is not experiencing any locking or tenderness at this time.  Status post total knee replacement, bilateral - 2009.  Doing well.  She has good range of motion of both knee replacements with no warmth or effusion.  She has started to increase her exercise regimen and has been walking several days a week and going to water aerobics.  Discussed the importance of lower extremity muscle strengthening.  I offered her referral to physical therapy for lower extremity muscle strengthening and fall prevention but she declined at this time.  She would like to work on exercises on her own at [employer].  She also plans on continuing to try to follow a plant based diet and work on weight loss. She can continue to use voltaren gel topically as needed for pain relief, and she will continue to take tumeric and fish oil daily.   Primary osteoarthritis of both feet - s/p bilateral bunionectomy -severe osteoarthritis involving bilateral feet. She is not having any increased discomfort in her feet at this time.  She is wearing proper fitting shoes.   Status post carpal tunnel release - Right. Asymptomatic at this time.   DDD (degenerative disc disease), cervical: She has good ROM of the C-spine with no discomfort at this time.  No symptoms of radiculopathy.   DDD (degenerative disc disease), lumbar - MRI of her lumbar spine which showed degenerative disc disease and facet joint arthropathy.  She had mild spinal stenosis at L3 and 4.  Osteopenia of multiple sites - DEXA March 19, 2021 which showed a T score of -1.5 and BMD 0.823 in the left femur.  She is taking vitamin D 2000 units daily.   Peripheral neurogenic  pain  Other medical conditions are listed as follows:   Mixed hyperlipidemia  History of gastroesophageal reflux (GERD)  Secondary open-angle glaucoma of left eye, moderate stage  Early stage 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  Pseudophakia of both eyes  Orders: No orders of the defined types were placed in this encounter.  No orders of the defined types were placed in this encounter.    Follow-Up Instructions: follow up in 3 months    Ofilia Neas, PA-C  Note - This record has been created using [employer].  Chart creation errors have been sought, but may not always  have been located. Such creation errors do not reflect on  the standard of medical care.

## 2021-11-03 ENCOUNTER — Other Ambulatory Visit: Payer: Self-pay

## 2021-11-03 ENCOUNTER — Ambulatory Visit: Payer: Medicare Other | Admitting: Physician Assistant

## 2021-11-03 ENCOUNTER — Encounter: Payer: Self-pay | Admitting: Physician Assistant

## 2021-11-03 VITALS — BP 130/86 | HR 85 | Ht 63.5 in | Wt 169.4 lb

## 2021-11-03 DIAGNOSIS — H4052X2 Glaucoma secondary to other eye disorders, left eye, moderate stage: Secondary | ICD-10-CM

## 2021-11-03 DIAGNOSIS — M5136 Other intervertebral disc degeneration, lumbar region: Secondary | ICD-10-CM

## 2021-11-03 DIAGNOSIS — M19041 Primary osteoarthritis, right hand: Secondary | ICD-10-CM

## 2021-11-03 DIAGNOSIS — Z9889 Other specified postprocedural states: Secondary | ICD-10-CM

## 2021-11-03 DIAGNOSIS — M19071 Primary osteoarthritis, right ankle and foot: Secondary | ICD-10-CM

## 2021-11-03 DIAGNOSIS — H353131 Nonexudative age-related macular degeneration, bilateral, early dry stage: Secondary | ICD-10-CM

## 2021-11-03 DIAGNOSIS — M65331 Trigger finger, right middle finger: Secondary | ICD-10-CM | POA: Diagnosis not present

## 2021-11-03 DIAGNOSIS — Z961 Presence of intraocular lens: Secondary | ICD-10-CM

## 2021-11-03 DIAGNOSIS — E782 Mixed hyperlipidemia: Secondary | ICD-10-CM

## 2021-11-03 DIAGNOSIS — M65321 Trigger finger, right index finger: Secondary | ICD-10-CM | POA: Diagnosis not present

## 2021-11-03 DIAGNOSIS — M5030 Other cervical disc degeneration, unspecified cervical region: Secondary | ICD-10-CM

## 2021-11-03 DIAGNOSIS — M19042 Primary osteoarthritis, left hand: Secondary | ICD-10-CM

## 2021-11-03 DIAGNOSIS — Z96653 Presence of artificial knee joint, bilateral: Secondary | ICD-10-CM

## 2021-11-03 DIAGNOSIS — Z8719 Personal history of other diseases of the digestive system: Secondary | ICD-10-CM

## 2021-11-03 DIAGNOSIS — M19072 Primary osteoarthritis, left ankle and foot: Secondary | ICD-10-CM

## 2021-11-03 DIAGNOSIS — M792 Neuralgia and neuritis, unspecified: Secondary | ICD-10-CM

## 2021-11-03 DIAGNOSIS — M8589 Other specified disorders of bone density and structure, multiple sites: Secondary | ICD-10-CM

## 2021-11-03 NOTE — Patient Instructions (Signed)
Hand Exercises Hand exercises can be helpful for almost anyone. These exercises can strengthen the hands, improve flexibility and movement, and increase blood flow to the hands. These results can make work and daily tasks easier. Hand exercises can be especially helpful for people who have joint pain from arthritis or have nerve damage from overuse (carpal tunnel syndrome). These exercises can also help people who have injured a hand. Exercises Most of these hand exercises are gentle stretching and motion exercises. It is usually safe to do them often throughout the day. Warming up your hands before exercise may help to reduce stiffness. You can do this with gentle massage or by placing your hands in warm water for 10-15 minutes. It is normal to feel some stretching, pulling, tightness, or mild discomfort as you begin new exercises. This will gradually improve. Stop an exercise right away if you feel sudden, severe pain or your pain gets worse. Ask your health care provider which exercises are best for you. Knuckle bend or "claw" fist  Stand or sit with your arm, hand, and all five fingers pointed straight up. Make sure to keep your wrist straight during the exercise. Gently bend your fingers down toward your palm until the tips of your fingers are touching the top of your palm. Keep your big knuckle straight and just bend the small knuckles in your fingers. Hold this position for __________ seconds. Straighten (extend) your fingers back to the starting position. Repeat this exercise 5-10 times with each hand. Full finger fist  Stand or sit with your arm, hand, and all five fingers pointed straight up. Make sure to keep your wrist straight during the exercise. Gently bend your fingers into your palm until the tips of your fingers are touching the middle of your palm. Hold this position for __________ seconds. Extend your fingers back to the starting position, stretching every joint fully. Repeat  this exercise 5-10 times with each hand. Straight fist Stand or sit with your arm, hand, and all five fingers pointed straight up. Make sure to keep your wrist straight during the exercise. Gently bend your fingers at the big knuckle, where your fingers meet your hand, and the middle knuckle. Keep the knuckle at the tips of your fingers straight and try to touch the bottom of your palm. Hold this position for __________ seconds. Extend your fingers back to the starting position, stretching every joint fully. Repeat this exercise 5-10 times with each hand. Tabletop  Stand or sit with your arm, hand, and all five fingers pointed straight up. Make sure to keep your wrist straight during the exercise. Gently bend your fingers at the big knuckle, where your fingers meet your hand, as far down as you can while keeping the small knuckles in your fingers straight. Think of forming a tabletop with your fingers. Hold this position for __________ seconds. Extend your fingers back to the starting position, stretching every joint fully. Repeat this exercise 5-10 times with each hand. Finger spread  Place your hand flat on a table with your palm facing down. Make sure your wrist stays straight as you do this exercise. Spread your fingers and thumb apart from each other as far as you can until you feel a gentle stretch. Hold this position for __________ seconds. Bring your fingers and thumb tight together again. Hold this position for __________ seconds. Repeat this exercise 5-10 times with each hand. Making circles  Stand or sit with your arm, hand, and all five fingers pointed   straight up. Make sure to keep your wrist straight during the exercise. Make a circle by touching the tip of your thumb to the tip of your index finger. Hold for __________ seconds. Then open your hand wide. Repeat this motion with your thumb and each finger on your hand. Repeat this exercise 5-10 times with each hand. Thumb  motion  Sit with your forearm resting on a table and your wrist straight. Your thumb should be facing up toward the ceiling. Keep your fingers relaxed as you move your thumb. Lift your thumb up as high as you can toward the ceiling. Hold for __________ seconds. Bend your thumb across your palm as far as you can, reaching the tip of your thumb for the small finger (pinkie) side of your palm. Hold for __________ seconds. Repeat this exercise 5-10 times with each hand. Grip strengthening  Hold a stress ball or other soft ball in the middle of your hand. Slowly increase the pressure, squeezing the ball as much as you can without causing pain. Think of bringing the tips of your fingers into the middle of your palm. All of your finger joints should bend when doing this exercise. Hold your squeeze for __________ seconds, then relax. Repeat this exercise 5-10 times with each hand. Contact a health care provider if: Your hand pain or discomfort gets much worse when you do an exercise. Your hand pain or discomfort does not improve within 2 hours after you exercise. If you have any of these problems, stop doing these exercises right away. Do not do them again unless your health care provider says that you can. Get help right away if: You develop sudden, severe hand pain or swelling. If this happens, stop doing these exercises right away. Do not do them again unless your health care provider says that you can. This information is not intended to replace advice given to you by your health care provider. Make sure you discuss any questions you have with your health care provider. Document Revised: 03/11/2021 Document Reviewed: 03/11/2021 Elsevier Patient Education  2022 [employer] Inc.  

## 2021-11-10 ENCOUNTER — Ambulatory Visit: Payer: Medicare Other | Admitting: Podiatry

## 2021-11-10 ENCOUNTER — Other Ambulatory Visit: Payer: Self-pay

## 2021-11-10 ENCOUNTER — Encounter: Payer: Self-pay | Admitting: Podiatry

## 2021-11-10 DIAGNOSIS — M2041 Other hammer toe(s) (acquired), right foot: Secondary | ICD-10-CM

## 2021-11-10 DIAGNOSIS — M79672 Pain in left foot: Secondary | ICD-10-CM

## 2021-11-10 DIAGNOSIS — M79671 Pain in right foot: Secondary | ICD-10-CM | POA: Diagnosis not present

## 2021-11-10 NOTE — Progress Notes (Signed)
  Subjective:  Patient ID: Hailey Perkins, female    DOB: [birth year],   MRN: [id number]  Chief Complaint  Patient presents with   Foot Pain    Pt states she would like support on how to protect her feet and toes from having pain. Pt states she had orthotics previously but would like some new ones.     86 y.o. female presents for concern of foot pain and increased exercises and hammerotes. Patient relates she has increased her activity and wants to maintain her foot health and is interested in orthotics. She also relates pain with her hammertoes and would like to talk about surgery . Denies any other pedal complaints. Denies n/v/f/c.   Past Medical History:  Diagnosis Date   Arthritis    Cataract 2010   bilateral eyes   Chicken pox    GERD (gastroesophageal reflux disease)    Glaucoma    History of frequent urinary tract infections    Hyperlipidemia    Osteopenia    Peripheral neuropathy    Sleep apnea    wears a CPAP   Urine incontinence     Objective:  Physical Exam: Vascular: DP/PT pulses 2/4 bilateral. CFT <3 seconds. Normal hair growth on digits. No edema.  Skin. No lacerations or abrasions bilateral feet.  Musculoskeletal: MMT 5/5 bilateral lower extremities in DF, PF, Inversion and Eversion. Deceased ROM in DF of ankle joint. Hammered digitis 2-5 bilateral. No pain to palpation.  Neurological: Sensation intact to light touch.   Assessment:   1. Hammer toe of right foot   2. Foot pain, bilateral      Plan:  Patient was evaluated and treated and all questions answered. -X-rays reviewed. -Educated on hammertoes and treatment options  -Discussed padding including toe caps and crest pads.  -Discussed need for potential surgery if pain does not improved. Patient would like to wait a few months and then will talk about surgery again.  -Will follow-with brain for custom orthotics.   -Patient to follow-up as needed. Discussed calling if any changes or increased pain.     Louann Sjogren, DPM

## 2021-11-12 ENCOUNTER — Other Ambulatory Visit: Payer: Self-pay

## 2021-11-12 ENCOUNTER — Ambulatory Visit: Payer: Medicare Other

## 2021-11-12 DIAGNOSIS — M79672 Pain in left foot: Secondary | ICD-10-CM

## 2021-11-12 DIAGNOSIS — M2041 Other hammer toe(s) (acquired), right foot: Secondary | ICD-10-CM

## 2021-11-12 DIAGNOSIS — G5762 Lesion of plantar nerve, left lower limb: Secondary | ICD-10-CM

## 2021-11-12 DIAGNOSIS — M79671 Pain in right foot: Secondary | ICD-10-CM

## 2021-11-12 NOTE — Progress Notes (Signed)
SITUATION Reason for Consult: Evaluation for Bilateral Custom Foot Orthoses Patient / Caregiver Report: Patient is ready for new orthotics  OBJECTIVE DATA: Patient History / Diagnosis: Hammer toe of right foot  Morton neuroma, left  Foot pain, bilateral  Current or Previous Devices: Previous hard style orthotics  Foot Examination: Skin presentation:   Intact Ulcers & Callousing:   None and no history Toe / Foot Deformities:  Hammertoes Weight Bearing Presentation:  Planus Sensation:    Intact  ORTHOTIC RECOMMENDATION Recommended Device: 1x pair of custom functional foot orthotics  GOALS OF ORTHOSES - Reduce Pain - Prevent Foot Deformity - Prevent Progression of Further Foot Deformity - Relieve Pressure - Improve the Overall Biomechanical Function of the Foot and Lower Extremity.  ACTIONS PERFORMED Patient was casted for Foot Orthoses via crush box. Procedure was explained and patient tolerated procedure well. All questions were answered and concerns addressed.  PLAN Potential out of pocket cost was communicated to patient. Casts are to be sent to [facility] for fabrication. Patient is to be called for fitting when devices are ready.

## 2021-11-22 ENCOUNTER — Ambulatory Visit: Payer: Medicare Other | Admitting: Family Medicine

## 2021-12-13 ENCOUNTER — Ambulatory Visit: Payer: Medicare Other

## 2021-12-13 ENCOUNTER — Other Ambulatory Visit: Payer: Self-pay

## 2021-12-13 DIAGNOSIS — G5762 Lesion of plantar nerve, left lower limb: Secondary | ICD-10-CM

## 2021-12-13 DIAGNOSIS — M79671 Pain in right foot: Secondary | ICD-10-CM

## 2021-12-13 DIAGNOSIS — M2041 Other hammer toe(s) (acquired), right foot: Secondary | ICD-10-CM

## 2021-12-13 NOTE — Progress Notes (Signed)
SITUATION: Reason for Visit: Fitting and Delivery of Custom Fabricated Foot Orthoses Patient Report: Patient reports comfort and is satisfied with device.  OBJECTIVE DATA: Patient History / Diagnosis:     ICD-10-CM   1. Hammer toe of right foot  M20.41     2. Morton neuroma, left  G57.62     3. Foot pain, bilateral  M79.671    M79.672       Provided Device:  Custom foot orthotics  GOAL OF ORTHOSIS - Improve gait - Decrease energy expenditure - Improve Balance - Provide Triplanar stability of foot complex - Facilitate motion  ACTIONS PERFORMED Patient was fit with foot orthotics trimmed to shoe last. Patient tolerated fittign procedure. Device was modified as follows to better fit patient: - Toe plate was trimmed to shoe last  Patient was provided with verbal and written instruction and demonstration regarding donning, doffing, wear, care, proper fit, function, purpose, cleaning, and use of the orthosis and in all related precautions and risks and benefits regarding the orthosis.  Patient was also provided with verbal instruction regarding how to report any failures or malfunctions of the orthosis and necessary follow up care. Patient was also instructed to contact our office regarding any change in status that may affect the function of the orthosis.  Patient demonstrated independence with proper donning, doffing, and fit and verbalized understanding of all instructions.  PLAN: Patient is to follow up in one week or as necessary (PRN). All questions were answered and concerns addressed. Plan of care was discussed with and agreed upon by the patient.

## 2021-12-14 ENCOUNTER — Encounter: Payer: Self-pay | Admitting: Family Medicine

## 2021-12-28 ENCOUNTER — Other Ambulatory Visit: Payer: Self-pay

## 2021-12-28 ENCOUNTER — Ambulatory Visit: Payer: Medicare Other

## 2021-12-28 DIAGNOSIS — G5762 Lesion of plantar nerve, left lower limb: Secondary | ICD-10-CM

## 2021-12-28 DIAGNOSIS — M2041 Other hammer toe(s) (acquired), right foot: Secondary | ICD-10-CM

## 2021-12-28 NOTE — Progress Notes (Signed)
SITUATION Reason for Consult: Follow-up with foot orthotics Patient / Caregiver Report: Patient needs heel wedge for leg length discrepancy, and top cover grabs her socks  OBJECTIVE DATA History / Diagnosis:    ICD-10-CM   1. Hammer toe of right foot  M20.41     2. Morton neuroma, left  G57.62       Change in Pathology: None  ACTIONS PERFORMED Patient's equipment was checked for structural stability and fit. Will send for adjustment, top coat replacement with spenco, unattached 1/4" heel wedge as patient was unable to recall which limb was shorter. Device(s) intact and fit is excellent. All questions answered and concerns addressed.  PLAN Follow-up as needed (PRN). Plan of care discussed with and agreed upon by patient / caregiver.

## 2022-01-17 ENCOUNTER — Other Ambulatory Visit: Payer: Self-pay

## 2022-01-17 ENCOUNTER — Ambulatory Visit: Payer: Medicare Other

## 2022-01-17 DIAGNOSIS — M2041 Other hammer toe(s) (acquired), right foot: Secondary | ICD-10-CM

## 2022-01-17 DIAGNOSIS — G5762 Lesion of plantar nerve, left lower limb: Secondary | ICD-10-CM

## 2022-01-17 NOTE — Progress Notes (Signed)
SITUATION Reason for Consult: Follow-up with custom foot orthotics Patient / Caregiver Report: Top coat replaced and heel lift added to right side  OBJECTIVE DATA History / Diagnosis:    ICD-10-CM   1. Hammer toe of right foot  M20.41     2. Morton neuroma, left  G57.62       Change in Pathology: None  ACTIONS PERFORMED Patient's equipment was checked for structural stability and fit. Device(s) intact and fit is excellent. All questions answered and concerns addressed.  PLAN Follow-up as needed (PRN). Plan of care discussed with and agreed upon by patient / caregiver.

## 2022-01-20 NOTE — Progress Notes (Deleted)
? ?Office Visit Note ? ?Patient: Hailey Perkins             ?Date of Birth: 10/31/1935           ?MRN: [id number]             ?PCP: Donnajean Lopes, MD ?Referring: Donnajean Lopes, MD ?Visit Date: 02/02/2022 ?Occupation: @GUAROCC @ ? ?Subjective:  ?No chief complaint on file. ? ? ?History of Present Illness: Hailey Perkins is a 87 y.o. female ***  ? ?Activities of Daily Living:  ?Patient reports morning stiffness for *** {minute/hour:19697}.   ?Patient {ACTIONS;DENIES/REPORTS:21021675::"Denies"} nocturnal pain.  ?Difficulty dressing/grooming: {ACTIONS;DENIES/REPORTS:21021675::"Denies"} ?Difficulty climbing stairs: {ACTIONS;DENIES/REPORTS:21021675::"Denies"} ?Difficulty getting out of chair: {ACTIONS;DENIES/REPORTS:21021675::"Denies"} ?Difficulty using hands for taps, buttons, cutlery, and/or writing: {ACTIONS;DENIES/REPORTS:21021675::"Denies"} ? ?No Rheumatology ROS completed.  ? ?PMFS History:  ?Patient Active Problem List  ? Diagnosis Date Noted  ? Chronic pain of right knee 05/27/2021  ? OSA on CPAP 05/27/2021  ? LLQ pain 12/16/2020  ? History of diverticulitis 12/16/2020  ? Hematochezia 12/16/2020  ? Abnormal colonoscopy 12/16/2020  ? Diverticulitis of colon 08/15/2020  ? Sleep-related hypoxia 08/05/2019  ? OSA (obstructive sleep apnea) 08/05/2019  ? PLMD (periodic limb movement disorder) 08/05/2019  ? Cerebellar 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 ([place]) 05/06/2019  ? Excessive daytime sleepiness 05/06/2019  ? Loud snoring 05/06/2019  ? Ventral hernia without obstruction or gangrene 02/22/2019  ? Chronic throat clearing 01/18/2019  ? Nocturnal cough 01/18/2019  ? Family history of colon cancer in father 01/18/2019  ? Diverticulosis 01/18/2019  ? Gait abnormality 12/27/2018  ? Numbness 12/27/2018  ? Status post total bilateral knee replacement 11/30/2018  ? DDD (degenerative disc disease), cervical 11/30/2018  ? Status post carpal tunnel release 11/30/2018  ? DDD (degenerative disc disease), lumbar  10/26/2018  ? Primary osteoarthritis of both hands 10/26/2018  ? Primary osteoarthritis of both feet 10/26/2018  ? Gastroesophageal reflux disease 08/27/2018  ? Bilateral impacted cerumen 08/21/2018  ? Sensorineural hearing loss (SNHL) of both ears 08/21/2018  ? Tinnitus, bilateral 08/21/2018  ? Blood in urine 04/17/2018  ? Secondary open-angle glaucoma of left eye, moderate stage 03/10/2018  ? Early stage 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 10/05/2017  ? Epiretinal membrane (ERM) of left eye 10/05/2017  ? Pseudophakia of both eyes 10/05/2017  ? Osteopenia 03/19/2017  ? Mixed hyperlipidemia 08/18/2016  ? Generalized osteoarthritis of multiple sites 08/18/2016  ? Peripheral neuropathic pain 08/18/2016  ? Overactive bladder 08/18/2016  ? Glaucoma 08/18/2016  ? Cervical disc disorder with radiculopathy of cervical region 08/17/2016  ? Carpal tunnel syndrome, right upper limb 08/17/2016  ? Abnormal finding on mammography 06/14/2016  ? Breast lump 05/06/2015  ?  ?Past Medical History:  ?Diagnosis Date  ? Arthritis   ? Cataract 2010  ? bilateral eyes  ? Chicken pox   ? GERD (gastroesophageal reflux disease)   ? Glaucoma   ? History of frequent urinary tract infections   ? Hyperlipidemia   ? Osteopenia   ? Peripheral neuropathy   ? Sleep apnea   ? wears a CPAP  ? Urine incontinence   ?  ?Family History  ?Problem Relation Age of Onset  ? Hyperlipidemia Mother   ? Heart disease Mother   ? Hypertension Mother   ? Cancer Father   ? Colon cancer Father   ? Alcohol abuse Brother   ? Drug abuse Brother   ? Cancer Brother   ? Cancer Maternal Aunt   ? Breast cancer Maternal  Aunt   ? Colon cancer Maternal Grandmother   ? Brain cancer Son   ? Autoimmune disease Daughter   ?     Behcet's  ? Esophageal cancer Neg Hx   ? Inflammatory bowel disease Neg Hx   ? Liver disease Neg Hx   ? Pancreatic cancer Neg Hx   ? Rectal cancer Neg Hx   ? Stomach cancer Neg Hx   ? ?Past Surgical History:  ?Procedure Laterality Date   ? ABDOMINAL HYSTERECTOMY  1977  ? ANAL FISTULECTOMY  1985  ? ANKLE FRACTURE SURGERY Right 2014  ? APPENDECTOMY    ? BIOPSY  02/15/2021  ? Procedure: BIOPSY;  Surgeon: Meridee Score Netty Starring., MD;  Location: [facility];  Service: Gastroenterology;;  ? BREAST BIOPSY Bilateral 10+ yrs ago  ? BENIGN  ? BUNIONECTOMY Left 1975  ? Left Toe  ? CATARACT EXTRACTION, BILATERAL  2009  ? COLONOSCOPY WITH PROPOFOL N/A 02/15/2021  ? Procedure: COLONOSCOPY WITH PROPOFOL;  Surgeon: Mansouraty, Netty Starring., MD;  Location: [facility];  Service: Gastroenterology;  Laterality: N/A;  ultraslimscope   ? DILATION AND CURETTAGE OF UTERUS  1972  ? eye PRK vision correction Left 10/09/2013  ? eye socket re-formed Left 2014  ? FOOT NEUROMA SURGERY Right 2003  ? plus bunionectomy, right great toe  ? HAMMER TOE SURGERY Right 2005  ? Right foot correction hammertoe little toe  ? HEMORRHOID SURGERY  1967  ? HIP SURGERY Right 2014  ? right hip femur pin implanted  ? KNEE ARTHROSCOPY Left 2010  ? debridement left knee scar tissue  ? LAPAROSCOPIC OOPHERECTOMY    ? 1986, 1990  ? LASIK  1998  ? NEUROMA SURGERY Right 2003  ? Right Foot Neuroma, plus Bunionectomy, Right Great Toe  ? POLYPECTOMY  02/15/2021  ? Procedure: POLYPECTOMY;  Surgeon: Mansouraty, Netty Starring., MD;  Location: [facility];  Service: Gastroenterology;;  ? RECTOCELE REPAIR  1968  ? REPLACEMENT TOTAL KNEE Left 01/2008  ? REPLACEMENT TOTAL KNEE Right 08/2008  ? retinal pucker correction Left 07/14/2012  ? schwannoma tumor (benign) removal  2011  ? right rib  ? SIGMOIDOSCOPY    ? TONSILLECTOMY AND ADENOIDECTOMY    ? VENTRAL HERNIA REPAIR  1993  ? ?Social History  ? ?Social History Narrative  ? Lives alone but her daughter lives next door.  ? Right-handed.  ? Occasional caffeine.  ? ?Immunization History  ?Administered Date(s) Administered  ? Influenza, High Dose Seasonal PF 08/18/2016, 08/25/2017  ? Influenza-Unspecified 08/19/2014  ? PFIZER(Purple Top)SARS-COV-2 Vaccination  12/14/2019, 01/04/2020, 09/15/2020, 07/05/2021  ? Pneumococcal Conjugate-13 06/03/2014  ? Pneumococcal Polysaccharide-23 05/25/2015  ? Tdap 02/26/2015  ? Zoster Recombinat (Shingrix) 07/04/2018, 08/17/2018  ? Zoster, Live 06/29/2011  ?  ? ?Objective: ?Vital Signs: There were no vitals taken for this visit.  ? ?Physical Exam  ? ?Musculoskeletal Exam: *** ? ?CDAI Exam: ?CDAI Score: -- ?Patient Global: --; Provider Global: -- ?Swollen: --; Tender: -- ?Joint Exam 02/02/2022  ? ?No joint exam has been documented for this visit  ? ?There is currently no information documented on the homunculus. Go to the Rheumatology activity and complete the homunculus joint exam. ? ?Investigation: ?No additional findings. ? ?Imaging: ?No results found. ? ?Recent Labs: ?Lab Results  ?Component Value Date  ? WBC 9.5 12/16/2020  ? HGB 13.5 12/16/2020  ? PLT 212.0 12/16/2020  ? NA 138 12/16/2020  ? K 4.6 12/16/2020  ? CL 103 12/16/2020  ? CO2 31 12/16/2020  ? GLUCOSE 112 (H)  12/16/2020  ? BUN 11 12/16/2020  ? CREATININE 0.78 12/16/2020  ? BILITOT 0.4 08/14/2020  ? ALKPHOS 84 08/14/2020  ? AST 20 08/14/2020  ? ALT 16 08/14/2020  ? PROT 6.9 08/14/2020  ? ALBUMIN 4.1 08/14/2020  ? CALCIUM 9.6 12/16/2020  ? ? ?Speciality Comments: No specialty comments available. ? ?Procedures:  ?No procedures performed ?Allergies: Tape  ? ?Assessment / Plan:     ?Visit Diagnoses: No diagnosis found. ? ?Orders: ?No orders of the defined types were placed in this encounter. ? ?No orders of the defined types were placed in this encounter. ? ? ?Face-to-face time spent with patient was *** minutes. Greater than 50% of time was spent in counseling and coordination of care. ? ?Follow-Up Instructions: No follow-ups on file. ? ? ?Earnestine Mealing, CMA ? ?Note - This record has been created using [employer].  ?Chart creation errors have been sought, but may not always  ?have been located. Such creation errors do not reflect on  ?the standard of medical care.  ?

## 2022-02-02 ENCOUNTER — Ambulatory Visit: Payer: Medicare Other | Admitting: Rheumatology

## 2022-02-02 DIAGNOSIS — M65321 Trigger finger, right index finger: Secondary | ICD-10-CM

## 2022-02-02 DIAGNOSIS — M792 Neuralgia and neuritis, unspecified: Secondary | ICD-10-CM

## 2022-02-02 DIAGNOSIS — H4052X2 Glaucoma secondary to other eye disorders, left eye, moderate stage: Secondary | ICD-10-CM

## 2022-02-02 DIAGNOSIS — Z8719 Personal history of other diseases of the digestive system: Secondary | ICD-10-CM

## 2022-02-02 DIAGNOSIS — M19041 Primary osteoarthritis, right hand: Secondary | ICD-10-CM

## 2022-02-02 DIAGNOSIS — E782 Mixed hyperlipidemia: Secondary | ICD-10-CM

## 2022-02-02 DIAGNOSIS — Z961 Presence of intraocular lens: Secondary | ICD-10-CM

## 2022-02-02 DIAGNOSIS — M19072 Primary osteoarthritis, left ankle and foot: Secondary | ICD-10-CM

## 2022-02-02 DIAGNOSIS — Z9889 Other specified postprocedural states: Secondary | ICD-10-CM

## 2022-02-02 DIAGNOSIS — M5136 Other intervertebral disc degeneration, lumbar region: Secondary | ICD-10-CM

## 2022-02-02 DIAGNOSIS — Z96653 Presence of artificial knee joint, bilateral: Secondary | ICD-10-CM

## 2022-02-02 DIAGNOSIS — M8589 Other specified disorders of bone density and structure, multiple sites: Secondary | ICD-10-CM

## 2022-02-02 DIAGNOSIS — H353131 Nonexudative age-related macular degeneration, bilateral, early dry stage: Secondary | ICD-10-CM

## 2022-02-02 DIAGNOSIS — M5030 Other cervical disc degeneration, unspecified cervical region: Secondary | ICD-10-CM

## 2022-02-02 DIAGNOSIS — M65331 Trigger finger, right middle finger: Secondary | ICD-10-CM

## 2022-02-25 ENCOUNTER — Other Ambulatory Visit: Payer: Self-pay | Admitting: Nurse Practitioner

## 2022-02-25 DIAGNOSIS — Z780 Asymptomatic menopausal state: Secondary | ICD-10-CM

## 2022-02-25 DIAGNOSIS — M8580 Other specified disorders of bone density and structure, unspecified site: Secondary | ICD-10-CM

## 2022-04-25 ENCOUNTER — Telehealth: Payer: Self-pay | Admitting: Family Medicine

## 2022-04-25 NOTE — Telephone Encounter (Signed)
Pt said the DME company inform her could not get nasal pillow, until June 23 and only 2 left. Have replacing every two weeks as instructed. Would like a call from the nurse

## 2022-04-25 NOTE — Telephone Encounter (Signed)
Sent email to [employer] (Ashley/Christina) asking for them to follow up with pt on this, waiting on response.

## 2022-04-25 NOTE — Telephone Encounter (Signed)
Received message back from [place] that she will call with pt to discuss

## 2022-05-31 ENCOUNTER — Other Ambulatory Visit (HOSPITAL_COMMUNITY): Payer: Self-pay | Admitting: Internal Medicine

## 2022-05-31 ENCOUNTER — Encounter (HOSPITAL_BASED_OUTPATIENT_CLINIC_OR_DEPARTMENT_OTHER): Payer: Self-pay

## 2022-05-31 ENCOUNTER — Ambulatory Visit (HOSPITAL_BASED_OUTPATIENT_CLINIC_OR_DEPARTMENT_OTHER)
Admission: RE | Admit: 2022-05-31 | Discharge: 2022-05-31 | Disposition: A | Discharge status: Home / Self Care | Payer: Medicare Other | Source: Ambulatory Visit | Attending: Internal Medicine | Admitting: Internal Medicine

## 2022-05-31 ENCOUNTER — Other Ambulatory Visit: Payer: Self-pay | Admitting: Internal Medicine

## 2022-05-31 DIAGNOSIS — K5792 Diverticulitis of intestine, part unspecified, without perforation or abscess without bleeding: Secondary | ICD-10-CM

## 2022-05-31 LAB — POCT I-STAT CREATININE: Creatinine, Ser: 0.8 mg/dL (ref 0.44–1.00)

## 2022-05-31 MED ORDER — IOHEXOL 300 MG/ML  SOLN
100.0000 mL | Freq: Once | INTRAMUSCULAR | Status: AC | PRN
  Administered 2022-05-31: 80 mL via INTRAVENOUS

## 2022-06-08 ENCOUNTER — Encounter: Payer: Self-pay | Admitting: Gastroenterology

## 2022-06-08 NOTE — Progress Notes (Signed)
Review of outside labs 05/23/2022 WBC 7.4 Hemoglobin/hematocrit 14.1/41.9 Platelets 269 MCV 94.4

## 2022-06-08 NOTE — Telephone Encounter (Signed)
If necessary, the patient can also increase her Metamucil to twice daily dosing in an effort of bulking her stool further. We will see her in follow-up clinic. Thanks. GM

## 2022-06-16 ENCOUNTER — Encounter: Payer: Self-pay | Admitting: *Deleted

## 2022-06-29 ENCOUNTER — Ambulatory Visit (INDEPENDENT_AMBULATORY_CARE_PROVIDER_SITE_OTHER): Payer: Medicare Other | Admitting: Physician Assistant

## 2022-06-29 ENCOUNTER — Encounter: Payer: Self-pay | Admitting: Physician Assistant

## 2022-06-29 VITALS — BP 126/82 | HR 81 | Ht 63.5 in | Wt 166.6 lb

## 2022-06-29 DIAGNOSIS — K5900 Constipation, unspecified: Secondary | ICD-10-CM | POA: Diagnosis not present

## 2022-06-29 DIAGNOSIS — K5732 Diverticulitis of large intestine without perforation or abscess without bleeding: Secondary | ICD-10-CM | POA: Diagnosis not present

## 2022-06-29 DIAGNOSIS — R159 Full incontinence of feces: Secondary | ICD-10-CM | POA: Diagnosis not present

## 2022-06-29 NOTE — Progress Notes (Signed)
Attending Physician's Attestation   I have reviewed the chart.   I agree with the Advanced Practitioner's note, impression, and recommendations with any updates as below. I am glad to hear that the patient is doing better.  Agree with empiric antibiotics if symptoms are significant.  Certainly with these continued bouts of diverticulitis, we will probably in the future strongly recommend she meet with a surgeon to consider partial colectomy.   Corliss Parish, MD [facility] Office # [phone]

## 2022-06-29 NOTE — Patient Instructions (Signed)
Glad you are feeling better!  Follow up with gastroenterology as needed.  If you are age 85 or older, your body mass index should be between 23-30. Your Body mass index is 29.05 kg/m. If this is out of the aforementioned range listed, please consider follow up with your Primary Care Provider.  If you are age 67 or younger, your body mass index should be between 19-25. Your Body mass index is 29.05 kg/m. If this is out of the aformentioned range listed, please consider follow up with your Primary Care Provider.   ________________________________________________________  The [facility] providers would like to encourage you to use [facility] to communicate with providers for non-urgent requests or questions.  Due to long hold times on the telephone, sending your provider a message by [facility] may be a faster and more efficient way to get a response.  Please allow 48 business hours for a response.  Please remember that this is for non-urgent requests.  _______________________________________________________  Due to recent changes in healthcare laws, you may see the results of your imaging and laboratory studies on MyChart before your provider has had a chance to review them.  We understand that in some cases there may be results that are confusing or concerning to you. Not all laboratory results come back in the same time frame and the provider may be waiting for multiple results in order to interpret others.  Please give [place] 48 hours in order for your provider to thoroughly review all the results before contacting the office for clarification of your results.

## 2022-06-29 NOTE — Progress Notes (Signed)
Chief Complaint: Diverticulitis  HPI:    Hailey Perkins is an 87 year old female, known to Dr. Meridee Score, with a past medical history as listed below, who was referred to me by Garlan Fillers, MD for a complaint of diverticulitis.      02/15/2021 colonoscopy with hemorrhoids, stent no stricture in the rectosigmoid colon and sigmoid colon, diverticulosis, one 3 mm polyp in the sigmoid colon otherwise normal.    05/23/2022 CBC with a white count of 7.4, hemoglobin 14.1.    05/31/2022 CT the abdomen pelvis with contrast for lower abdominal pain and diarrhea with sigmoid colon diverticulosis with mild to moderate acute distal sigmoid colon diverticulitis.    06/03/2022 patient contacted our clinic and discussed that she had recently finished antibiotics for a diverticulitis attack and was having some "involuntary release of stool".  At that time patient reported that on 6/19 patient had symptoms from diverticulitis and was started on Metronidazole 500 mg twice daily and Ciprofloxacin 500 mg twice daily.  Tyrus risk from normal to soft easy to digest full liquids.  That time was finishing antibiotics and still having heaviness in the lower abdomen.  Also was taking Florastor to prevent vaginal infection and Metamucil.  Discussed fecal incontinence which had started 3 months ago with occasionally a small spotting when releasing gas or bending over.  Dr. Meridee Score commented that she could increase her Metamucil to twice daily.    Today, the patient tells me that she is doing well.  She has changed her morning regimen a little bit and is taking MiraLAX 1 dose a day as well as 3 doses of Metamucil.  This works well for her and she has full complete bowel movements with no further episodes of fecal incontinence.  She no longer has any abdominal pain and tells me the antibiotics worked well for her.  She is trying to manage her constipation and currently is on a good regimen.    Denies fever, chills, continued  abdominal pain, nausea or vomiting.  Past Medical History:  Diagnosis Date   Aortic atherosclerosis (HCC)    Arthritis    Cataract 2010   bilateral eyes   Chicken pox    Cholelithiasis    Diverticulitis    Diverticulosis    GERD (gastroesophageal reflux disease)    Glaucoma    Hiatal hernia    History of frequent urinary tract infections    Hyperlipidemia    Osteopenia    Peripheral neuropathy    Sleep apnea    wears a CPAP   Urine incontinence     Past Surgical History:  Procedure Laterality Date   ABDOMINAL HYSTERECTOMY  1977   ANAL FISTULECTOMY  1985   ANKLE FRACTURE SURGERY Right 2014   APPENDECTOMY     BIOPSY  02/15/2021   Procedure: BIOPSY;  Surgeon: Lemar Lofty., MD;  Location: [facility];  Service: Gastroenterology;;   BREAST BIOPSY Bilateral 10+ yrs ago   BENIGN   BUNIONECTOMY Left 1975   Left Toe   CATARACT EXTRACTION, BILATERAL  2009   COLONOSCOPY WITH PROPOFOL N/A 02/15/2021   Procedure: COLONOSCOPY WITH PROPOFOL;  Surgeon: Lemar Lofty., MD;  Location: [facility];  Service: Gastroenterology;  Laterality: N/A;  ultraslimscope    DILATION AND CURETTAGE OF UTERUS  1972   eye PRK vision correction Left 10/09/2013   eye socket re-formed Left 2014   FOOT NEUROMA SURGERY Right 2003   plus bunionectomy, right great toe   HAMMER TOE SURGERY Right 2005  Right foot correction hammertoe little toe   HEMORRHOID SURGERY  1967   HIP SURGERY Right 2014   right hip femur pin implanted   KNEE ARTHROSCOPY Left 2010   debridement left knee scar tissue   LAPAROSCOPIC OOPHERECTOMY     1986, 1990   LASIK  1998   NEUROMA SURGERY Right 2003   Right Foot Neuroma, plus Bunionectomy, Right Great Toe   POLYPECTOMY  02/15/2021   Procedure: POLYPECTOMY;  Surgeon: Mansouraty, Netty Starring., MD;  Location: [facility];  Service: Gastroenterology;;   RECTOCELE REPAIR  1968   REPLACEMENT TOTAL KNEE Left 01/2008   REPLACEMENT TOTAL KNEE Right 08/2008    retinal pucker correction Left 07/14/2012   schwannoma tumor (benign) removal  2011   right rib   SIGMOIDOSCOPY     TONSILLECTOMY AND ADENOIDECTOMY     VENTRAL HERNIA REPAIR  1993    Current Outpatient Medications  Medication Sig Dispense Refill   Alpha-Lipoic Acid 300 MG TABS Take 300 mg by mouth daily. (Patient not taking: Reported on 11/03/2021)     Ascorbic Acid (VITAMIN C) 1000 MG tablet Take 1,000 mg by mouth daily.     aspirin EC 81 MG tablet Take 81 mg by mouth daily. Swallow whole.     betamethasone dipropionate 0.05 % lotion Apply 1 application topically daily as needed (scalp irritation).  0   Biotin 10 MG TABS Take 10 mg by mouth daily.     Calcium Citrate-Vitamin D (CALCIUM + D PO) Take 2 tablets by mouth daily.     celecoxib (CELEBREX) 200 MG capsule Take 200 mg by mouth daily.     Cholecalciferol (VITAMIN D3) 2000 units TABS Take 2,000 Units by mouth daily.     diclofenac Sodium (VOLTAREN) 1 % GEL Apply 1 application topically 3 (three) times daily as needed (pain).     dorzolamide (TRUSOPT) 2 % ophthalmic solution Place 1 drop into both eyes 2 (two) times daily.     DULoxetine (CYMBALTA) 30 MG capsule TAKE 1 CAPSULE(30 MG) BY MOUTH DAILY (Patient taking differently: Take 30 mg by mouth daily.) 90 capsule 2   GEMTESA 75 MG TABS Take 1 tablet by mouth daily as needed.     Ginger, Zingiber officinalis, (GINGER ROOT) 550 MG CAPS Take 550 mg by mouth in the morning, at noon, and at bedtime. (Patient not taking: Reported on 11/03/2021)     GLUCOSAMINE-CHONDROITIN-MSM PO Take 2 capsules by mouth daily. 1500 mg Glucosamine, 750 mg MSM     Magnesium 250 MG TABS Take 250 mg by mouth daily.     metroNIDAZOLE (METROGEL) 0.75 % gel Apply 1 application topically as needed (rosacea).     modafinil (PROVIGIL) 100 MG tablet Take 100 mg by mouth daily.     Multiple Vitamins-Minerals (PRESERVISION AREDS PO) Take 1 capsule by mouth in the morning and at bedtime.      neomycin-polymyxin-hydrocortisone (CORTISPORIN) OTIC solution Apply 1 to 2 drops to bilateral fourth toenails at the area of ingrowing for 2 weeks. 10 mL 0   Omega-3 Fatty Acids (FISH OIL) 1000 MG CAPS Take 1,000 mg by mouth daily.     pantoprazole (PROTONIX) 40 MG tablet Take 40 mg by mouth daily.     Plant Sterols and Stanols (CHOLESTOFF PLUS PO) Take 2 capsules by mouth in the morning and at bedtime.     polyethylene glycol powder (GLYCOLAX/MIRALAX) 17 GM/SCOOP powder Take 17 g by mouth daily as needed for moderate constipation.     Sodium  Fluoride 1.1 % PSTE Place 1 application onto teeth daily. (Patient not taking: Reported on 11/03/2021)     TURMERIC CURCUMIN PO Take 2 capsules by mouth daily.     vitamin B-12 (CYANOCOBALAMIN) 1000 MCG tablet Take 1,000 mcg by mouth daily.     No current facility-administered medications for this visit.    Allergies as of 06/29/2022 - Review Complete 05/31/2022  Allergen Reaction Noted   Tape Other (See Comments) 11/07/2017    Family History  Problem Relation Age of Onset   Hyperlipidemia Mother    Heart disease Mother    Hypertension Mother    Cancer Father    Colon cancer Father    Alcohol abuse Brother    Drug abuse Brother    Cancer Brother    Cancer Maternal Aunt    Breast cancer Maternal Aunt    Colon cancer Maternal Grandmother    Brain cancer Son    Autoimmune disease Daughter        Behcet's   Esophageal cancer Neg Hx    Inflammatory bowel disease Neg Hx    Liver disease Neg Hx    Pancreatic cancer Neg Hx    Rectal cancer Neg Hx    Stomach cancer Neg Hx     Social History   Socioeconomic History   Marital status: Widowed    Spouse name: Not on file   Number of children: 2   Years of education: college   Highest education level: Master's degree (e.g., MA, MS, MEng, MEd, MSW, MBA)  Occupational History   Occupation: Retired  Tobacco Use   Smoking status: Never   Smokeless tobacco: Never  Vaping Use   Vaping Use:  Never used  Substance and Sexual Activity   Alcohol use: No   Drug use: No   Sexual activity: Never  Other Topics Concern   Not on file  Social History Narrative   Lives alone but her daughter lives next door.   Right-handed.   Occasional caffeine.   Social Determinants of Health   Financial Resource Strain: Not on file  Food Insecurity: Not on file  Transportation Needs: Not on file  Physical Activity: Not on file  Stress: Not on file  Social Connections: Not on file  Intimate Partner Violence: Not on file    Review of Systems:    Constitutional: No weight loss, fever or chills Cardiovascular: No chest pain Respiratory: No SOB  Gastrointestinal: See HPI and otherwise negative   Physical Exam:  Vital signs: BP 126/82   Pulse 81   Ht 5' 3.5" (1.613 m)   Wt 166 lb 9.6 oz (75.6 kg)   SpO2 95%   BMI 29.05 kg/m    Constitutional:   Pleasant Elderly Caucasian female appears to be in NAD, Well developed, Well nourished, alert and cooperative  Respiratory: Respirations even and unlabored. Lungs clear to auscultation bilaterally.   No wheezes, crackles, or rhonchi.  Cardiovascular: Normal S1, S2. No MRG. Regular rate and rhythm. No peripheral edema, cyanosis or pallor.  Gastrointestinal:  Soft, nondistended, nontender. No rebound or guarding. Normal bowel sounds. No appreciable masses or hepatomegaly. Rectal:  Not performed.  Psychiatric: Oriented to person, place and time. Demonstrates good judgement and reason without abnormal affect or behaviors.  RELEVANT LABS AND IMAGING: CBC    Component Value Date/Time   WBC 9.5 12/16/2020 1120   RBC 4.63 12/16/2020 1120   HGB 13.5 12/16/2020 1120   HCT 41.2 12/16/2020 1120   PLT 212.0 12/16/2020 1120  MCV 89.0 12/16/2020 1120   MCHC 32.8 12/16/2020 1120   RDW 13.8 12/16/2020 1120   LYMPHSABS 1.5 12/16/2020 1120   MONOABS 0.6 12/16/2020 1120   EOSABS 0.2 12/16/2020 1120   BASOSABS 0.1 12/16/2020 1120    CMP      Component Value Date/Time   NA 138 12/16/2020 1120   K 4.6 12/16/2020 1120   CL 103 12/16/2020 1120   CO2 31 12/16/2020 1120   GLUCOSE 112 (H) 12/16/2020 1120   BUN 11 12/16/2020 1120   CREATININE 0.80 05/31/2022 1608   CALCIUM 9.6 12/16/2020 1120   PROT 6.9 08/14/2020 1021   PROT 6.3 12/27/2018 1531   ALBUMIN 4.1 08/14/2020 1021   AST 20 08/14/2020 1021   ALT 16 08/14/2020 1021   ALKPHOS 84 08/14/2020 1021   BILITOT 0.4 08/14/2020 1021    Assessment: 1.  Diverticulitis: Diagnosed in June and treated with Cipro and Flagyl, resolved now 2.  Constipation/ fecal incontinence: Not currently suffering from this, doing well on Metamucil 3 doses in the morning as well as MiraLAX 1 dose in the morning  Plan: 1.  Continue current regimen. 2.  Discussed with patient that if she starts with symptoms of diverticulitis in the future she can call and let us know and we can empirically start her on Cipro and Flagyl and schedule her follow-up. 3.  Patient to follow in clinic with [place] as needed.  Hyacinth Meeker, PA-C [facility] 06/29/2022, 10:01 AM  Cc: Garlan Fillers, MD

## 2022-07-15 ENCOUNTER — Other Ambulatory Visit (HOSPITAL_BASED_OUTPATIENT_CLINIC_OR_DEPARTMENT_OTHER): Payer: Self-pay

## 2022-07-15 MED ORDER — AMOXICILLIN 500 MG PO CAPS
500.0000 mg | ORAL_CAPSULE | ORAL | 2 refills | Status: AC
  Filled 2022-07-15: qty 12, 3d supply, fill #0

## 2022-08-24 DIAGNOSIS — M25551 Pain in right hip: Secondary | ICD-10-CM | POA: Insufficient documentation

## 2022-08-24 DIAGNOSIS — M542 Cervicalgia: Secondary | ICD-10-CM | POA: Insufficient documentation

## 2022-08-24 DIAGNOSIS — M25521 Pain in right elbow: Secondary | ICD-10-CM | POA: Insufficient documentation

## 2022-08-24 DIAGNOSIS — M25531 Pain in right wrist: Secondary | ICD-10-CM | POA: Insufficient documentation

## 2022-09-17 ENCOUNTER — Ambulatory Visit: Payer: Medicare Other | Admitting: Podiatry

## 2022-09-17 ENCOUNTER — Ambulatory Visit (INDEPENDENT_AMBULATORY_CARE_PROVIDER_SITE_OTHER): Payer: Medicare Other

## 2022-09-17 DIAGNOSIS — M2042 Other hammer toe(s) (acquired), left foot: Secondary | ICD-10-CM

## 2022-09-17 DIAGNOSIS — S99922A Unspecified injury of left foot, initial encounter: Secondary | ICD-10-CM

## 2022-09-17 DIAGNOSIS — S9031XA Contusion of right foot, initial encounter: Secondary | ICD-10-CM | POA: Diagnosis not present

## 2022-09-17 DIAGNOSIS — L600 Ingrowing nail: Secondary | ICD-10-CM | POA: Diagnosis not present

## 2022-09-17 DIAGNOSIS — M2041 Other hammer toe(s) (acquired), right foot: Secondary | ICD-10-CM | POA: Diagnosis not present

## 2022-09-17 NOTE — Progress Notes (Signed)
Subjective: Chief Complaint  Patient presents with   Ingrown Toenail    Patient stated she had a pedicure done a few weeks ago, and felt like the tech cut the nail too low. Patient reports medial border is sore, it was red and swollen but that has gotten better. Patient went to PCP and got an Rx for antibiotics and a antibiotic cream   87 year old female presents with above concerns.  She states that overall she is doing much better but she wants to have it checked.  Still some occasional tenderness particularly when she wears a shoe at home its too small for her.  Denies any drainage or pus.  The antibiotics were very helpful.  As an aside she had some bruising of the left foot she states that she dropped something on it in the garage on her foot a couple weeks ago.  Still having some residual soreness but it is getting better.  Objective: AAO x3, NAD DP/PT pulses palpable bilaterally, CRT less than 3 seconds Incurvation present left medial nail border with localized mild edema there is no drainage or pus not able to elicit any tenderness today.  There is no signs of infection otherwise.   Bruising present on the left forefoot just hide the digits on the metatarsal heads.  Mild diffuse tenderness of the foot.  Flexor, extensor tendons appear to be intact.   Hammertoes present No pain with calf compression, swelling, warmth, erythema  Assessment: Ingrown toenail left hallux, contusion left foot  Plan: -All treatment options discussed with the patient including all alternatives, risks, complications.  -X-rays were obtained and reviewed with the left foot.  3 views obtained.  No evidence of acute fracture. -We discussed both conservative as well as surgical options with ingrown toenail.  Overall she is doing much better.  We discussed nail trimming techniques.  She states the nails get dry we discussed different topicals that she can use to help hydrate the toenails.  Monitor for any signs or  symptoms of infection. -For the bruising it seems to be getting better.  Ice, elevation. -Patient encouraged to call the office with any questions, concerns, change in symptoms.

## 2022-09-20 NOTE — Patient Instructions (Signed)
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
## 2022-09-21 ENCOUNTER — Encounter: Payer: Self-pay | Admitting: Family Medicine

## 2022-09-21 ENCOUNTER — Ambulatory Visit: Payer: Medicare Other | Admitting: Family Medicine

## 2022-09-21 VITALS — BP 106/67 | HR 84 | Ht 63.0 in | Wt 166.0 lb

## 2022-09-21 DIAGNOSIS — G4733 Obstructive sleep apnea (adult) (pediatric): Secondary | ICD-10-CM

## 2022-09-22 ENCOUNTER — Telehealth: Payer: Self-pay

## 2022-11-01 ENCOUNTER — Other Ambulatory Visit: Payer: Self-pay

## 2022-11-01 ENCOUNTER — Ambulatory Visit (HOSPITAL_BASED_OUTPATIENT_CLINIC_OR_DEPARTMENT_OTHER): Payer: Medicare Other | Attending: Internal Medicine | Admitting: Physical Therapy

## 2022-11-01 DIAGNOSIS — R2689 Other abnormalities of gait and mobility: Secondary | ICD-10-CM

## 2022-11-01 DIAGNOSIS — M6281 Muscle weakness (generalized): Secondary | ICD-10-CM

## 2022-11-01 DIAGNOSIS — R2681 Unsteadiness on feet: Secondary | ICD-10-CM | POA: Diagnosis present

## 2022-11-01 NOTE — Therapy (Signed)
OUTPATIENT PHYSICAL THERAPY LOWER EXTREMITY EVALUATION   Patient Name: Hailey Perkins MRN: [id number] DOB:May 09, 1935, 87 y.o., female Today's Date: 11/01/2022  END OF SESSION:  PT End of Session - 11/01/22 1042     Visit Number 1    Number of Visits 8    Date for PT Re-Evaluation 01/03/23    Authorization Type [employer]    Progress Note Due on Visit 10    PT Start Time 0930    PT Stop Time 1018    PT Time Calculation (min) 48 min    Activity Tolerance Patient tolerated treatment well    Behavior During Therapy WFL for tasks assessed/performed             Past Medical History:  Diagnosis Date   Aortic atherosclerosis (HCC)    Arthritis    Cataract 2010   bilateral eyes   Chicken pox    Cholelithiasis    Diverticulitis    Diverticulosis    GERD (gastroesophageal reflux disease)    Glaucoma    Hiatal hernia    History of frequent urinary tract infections    Hyperlipidemia    Osteopenia    Peripheral neuropathy    Sleep apnea    wears a CPAP   Urine incontinence    Past Surgical History:  Procedure Laterality Date   ABDOMINAL HYSTERECTOMY  1977   ANAL FISTULECTOMY  1985   ANKLE FRACTURE SURGERY Right 2014   APPENDECTOMY     BIOPSY  02/15/2021   Procedure: BIOPSY;  Surgeon: Lemar Lofty., MD;  Location: [facility];  Service: Gastroenterology;;   BREAST BIOPSY Bilateral 10+ yrs ago   BENIGN   BUNIONECTOMY Left 1975   Left Toe   CATARACT EXTRACTION, BILATERAL  2009   COLONOSCOPY WITH PROPOFOL N/A 02/15/2021   Procedure: COLONOSCOPY WITH PROPOFOL;  Surgeon: Lemar Lofty., MD;  Location: [facility];  Service: Gastroenterology;  Laterality: N/A;  ultraslimscope    DILATION AND CURETTAGE OF UTERUS  1972   eye PRK vision correction Left 10/09/2013   eye socket re-formed Left 2014   FOOT NEUROMA SURGERY Right 2003   plus bunionectomy, right great toe   HAMMER TOE SURGERY Right 2005   Right foot correction hammertoe  little toe   HEMORRHOID SURGERY  1967   HIP SURGERY Right 2014   right hip femur pin implanted   KNEE ARTHROSCOPY Left 2010   debridement left knee scar tissue   LAPAROSCOPIC OOPHERECTOMY     1986, 1990   LASIK  1998   NEUROMA SURGERY Right 2003   Right Foot Neuroma, plus Bunionectomy, Right Great Toe   POLYPECTOMY  02/15/2021   Procedure: POLYPECTOMY;  Surgeon: Lemar Lofty., MD;  Location: [facility];  Service: Gastroenterology;;   RECTOCELE REPAIR  1968   REPLACEMENT TOTAL KNEE Left 01/2008   REPLACEMENT TOTAL KNEE Right 08/2008   retinal pucker correction Left 07/14/2012   schwannoma tumor (benign) removal  2011   right rib   SIGMOIDOSCOPY     TONSILLECTOMY AND ADENOIDECTOMY     VENTRAL HERNIA REPAIR  1993   Patient Active Problem List   Diagnosis Date Noted   Chronic pain of right knee 05/27/2021   OSA on CPAP 05/27/2021   LLQ pain 12/16/2020   History of diverticulitis 12/16/2020   Hematochezia 12/16/2020   Abnormal colonoscopy 12/16/2020   Diverticulitis of colon 08/15/2020   Sleep-related hypoxia 08/05/2019   OSA (obstructive sleep apnea) 08/05/2019   PLMD (periodic limb  movement disorder) 08/05/2019   Cerebellar 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) 05/06/2019   Excessive daytime sleepiness 05/06/2019   Loud snoring 05/06/2019   Ventral hernia without obstruction or gangrene 02/22/2019   Chronic throat clearing 01/18/2019   Nocturnal cough 01/18/2019   Family history of colon cancer in father 01/18/2019   Diverticulosis 01/18/2019   Gait abnormality 12/27/2018   Numbness 12/27/2018   Status post total bilateral knee replacement 11/30/2018   DDD (degenerative disc disease), cervical 11/30/2018   Status post carpal tunnel release 11/30/2018   DDD (degenerative disc disease), lumbar 10/26/2018   Primary osteoarthritis of both hands 10/26/2018   Primary osteoarthritis of both feet 10/26/2018   Gastroesophageal reflux disease 08/27/2018    Bilateral impacted cerumen 08/21/2018   Sensorineural hearing loss (SNHL) of both ears 08/21/2018   Tinnitus, bilateral 08/21/2018   Blood in urine 04/17/2018   Secondary open-angle glaucoma of left eye, moderate stage 03/10/2018   Early stage 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 10/05/2017   Epiretinal membrane (ERM) of left eye 10/05/2017   Pseudophakia of both eyes 10/05/2017   Osteopenia 03/19/2017   Mixed hyperlipidemia 08/18/2016   Generalized osteoarthritis of multiple sites 08/18/2016   Peripheral neuropathic pain 08/18/2016   Overactive bladder 08/18/2016   Glaucoma 08/18/2016   Cervical disc disorder with radiculopathy of cervical region 08/17/2016   Carpal tunnel syndrome, right upper limb 08/17/2016   Abnormal finding on mammography 06/14/2016   Breast lump 05/06/2015    PCP: Garlan FillersPaterson, Daniel G, MD  REFERRING PROVIDER: Garlan FillersPaterson, Daniel G, MD   REFERRING DIAG: Unsteadiness on feet [R26.81]   THERAPY DIAG:  Muscle weakness (generalized) - Plan: PT plan of care cert/re-cert  Other abnormalities of gait and mobility - Plan: PT plan of care cert/re-cert  Unsteadiness on feet - Plan: PT plan of care cert/re-cert  Rationale for Evaluation and Treatment: Rehabilitation  ONSET DATE: 2 years ago  SUBJECTIVE:   SUBJECTIVE STATEMENT: Pt states that two years ago, she started having some weakness in her left foot. She has been having difficulty clearing the floor with her L foot, resulting in her falling. Most difficulty with stair negotiation. She also reports tripping on rugs. Pt demonstrates difficulty with transfers.   PERTINENT HISTORY: Arthritis, Osteopenia, Peripheral neuropathy.  PAIN:  Are you having pain? No  PRECAUTIONS: None  WEIGHT BEARING RESTRICTIONS: No  FALLS:  Has patient fallen in last 6 months? Yes. Number of falls 2  LIVING ENVIRONMENT: Lives with: lives alone Lives in: House/apartment Stairs: No Has following  equipment at home: Single point cane, shower chair, and Grab bars  OCCUPATION: Retired  PLOF: Independent  PATIENT GOALS: Pt would like to improve her strength in bilat LE's and improve her balance.   NEXT MD VISIT:   OBJECTIVE:   DIAGNOSTIC FINDINGS: None at this time.   PATIENT SURVEYS:  ABC scale 50.6 %  COGNITION: Overall cognitive status: Within functional limits for tasks assessed     SENSATION: WFL   LOWER EXTREMITY ROM:  Active ROM Right eval Left eval  Hip flexion [facility]  Knee flexion [facility]  Knee extension [facility]  Ankle dorsiflexion [facility] WFL  Ankle plantarflexion WFL WFL   (Blank rows = not tested)  LOWER EXTREMITY MMT:  MMT Right eval Left eval  Hip flexion 4- 4-  Knee flexion 4+ 4+  Knee extension 5 5  Ankle dorsiflexion 4- 4-  Ankle plantarflexion     (Blank rows = not tested)  BERG  BALANCE TEST Sitting to Standing: 3.      Stands independently using hands Standing Unsupported: 4.      Stands safely for 2 minutes Sitting Unsupported: 4.     Sits for 2 minutes independently Standing to Sitting: 3.     Controls descent with hands  Transfers: 3.     Transfers safely definite use of hands Standing with eyes closed: 4.     Stands safely for 10 seconds  Standing with feet together: 4.     Stands for 1 minute safely Reaching forward with outstretched arm: 3.     Reaches forward 5 inches Retrieving object from the floor: 3.     Able to pick up with supervision Turning to look behind: 1.     Needs supervision when turning Turning 360 degrees: 2.     Able to turn slowly, but safely Place alternate foot on stool: 2.     4 steps without assistance/supervision Standing with one foot in front: 0.     Loses balance while standing/stepping Standing on one foot: 0.     Unable  Total Score: 36/56  FUNCTIONAL TESTS:  5 times sit to stand: 18.2, pt requires cues for standing up tall, and utilizes hands.   GAIT: Distance walked: 85ft  Assistive device  utilized: None Level of assistance: Complete Independence Comments: Pt walks with slow antalgic gait pattern.   TODAY'S TREATMENT:                                                                                                                              DATE: Creating, reviewing, and completing below HEP    PATIENT EDUCATION:  Education details: Educated pt on anatomy and physiology of current symptoms, FOTO, diagnosis, prognosis, HEP,  and POC. Person educated: Patient Education method: Medical illustrator Education comprehension: verbalized understanding and returned demonstration  HOME EXERCISE PROGRAM: None today   ASSESSMENT:  CLINICAL IMPRESSION: Patient referred to PT for unsteadiness on feet. Patient will benefit from skilled PT to address below impairments, limitations and improve overall function.  OBJECTIVE IMPAIRMENTS: decreased activity tolerance, difficulty walking, decreased balance, decreased endurance, decreased mobility, decreased ROM, decreased strength, impaired flexibility, impaired UE/LE use, postural dysfunction, and pain.  ACTIVITY LIMITATIONS: bending, lifting, carry, locomotion, cleaning, community activity, driving, and or occupation  PERSONAL FACTORS: Arthritis, Osteopenia, Peripheral neuropathy.  are also affecting patient's functional outcome.  REHAB POTENTIAL: Good  CLINICAL DECISION MAKING: Stable/uncomplicated  EVALUATION COMPLEXITY: Low   GOALS: Short term PT Goals Target date: 11/29/2022 Pt will be I and compliant with HEP. Baseline:  Goal status: New  Long term PT goals Target date: 12/27/2022 Pt will improve BERG by at least 3 points in order to demonstrate clinically significant improvement in balance.   Baseline:  Goal status: New Pt will improve hip/knee strength to at least 5-/5 MMT to improve functional strength Baseline: Goal status: New Pt will report <1 fall during her duration in  PT.         Baseline          Goal status: New  Pt will be able to ambulate community distances at least 1000 ft WNL gait pattern without complaints Baseline: Goal status: New       5. Pt will be able to negotiate stairs with a reciprocal gait pattern       Baseline:        Goal status: New       6. Pt will improve her STS by 2.3 seconds for Minimal clinically important difference.         Baseline:          Goal status: New   PLAN: PT FREQUENCY: 1-2 times per week   PT DURATION: 6-8 weeks  PLANNED INTERVENTIONS (unless contraindicated): aquatic PT, Canalith repositioning, cryotherapy, Electrical stimulation, Iontophoresis with 4 mg/ml dexamethasome, Moist heat, traction, Ultrasound, gait training, Therapeutic exercise, balance training, neuromuscular re-education, patient/family education, prosthetic training, manual techniques, passive ROM, dry needling, taping, vasopnuematic device, vestibular, spinal manipulations, joint manipulations  PLAN FOR NEXT SESSION: Assess HEP/update PRN, improve pt's balance, stair negotiation and gait.   Champ Mungo, PT 11/01/2022, 10:42 AM

## 2022-11-03 ENCOUNTER — Ambulatory Visit: Payer: Medicare Other | Admitting: Physical Therapy

## 2022-11-10 ENCOUNTER — Encounter (HOSPITAL_BASED_OUTPATIENT_CLINIC_OR_DEPARTMENT_OTHER): Payer: Self-pay | Admitting: Physical Therapy

## 2022-11-10 ENCOUNTER — Ambulatory Visit (HOSPITAL_BASED_OUTPATIENT_CLINIC_OR_DEPARTMENT_OTHER): Payer: Medicare Other | Attending: Internal Medicine | Admitting: Physical Therapy

## 2022-11-10 DIAGNOSIS — R2681 Unsteadiness on feet: Secondary | ICD-10-CM | POA: Diagnosis present

## 2022-11-10 DIAGNOSIS — M6281 Muscle weakness (generalized): Secondary | ICD-10-CM | POA: Diagnosis present

## 2022-11-10 DIAGNOSIS — R2689 Other abnormalities of gait and mobility: Secondary | ICD-10-CM | POA: Diagnosis present

## 2022-11-10 NOTE — Therapy (Signed)
OUTPATIENT PHYSICAL THERAPY LOWER EXTREMITY TREATMENT   Patient Name: Joliet Mallozzi MRN: [id number] DOB:[birth year], 87 y.o., female Today's Date: 11/11/2022  END OF SESSION:  PT End of Session - 11/10/22 1307     Visit Number 2    Number of Visits 8    Date for PT Re-Evaluation 01/03/23    Authorization Type [employer]    PT Start Time 1303    PT Stop Time 1347    PT Time Calculation (min) 44 min    Activity Tolerance Patient tolerated treatment well    Behavior During Therapy WFL for tasks assessed/performed             Past Medical History:  Diagnosis Date   Aortic atherosclerosis (HCC)    Arthritis    Cataract 2010   bilateral eyes   Chicken pox    Cholelithiasis    Diverticulitis    Diverticulosis    GERD (gastroesophageal reflux disease)    Glaucoma    Hiatal hernia    History of frequent urinary tract infections    Hyperlipidemia    Osteopenia    Peripheral neuropathy    Sleep apnea    wears a CPAP   Urine incontinence    Past Surgical History:  Procedure Laterality Date   ABDOMINAL HYSTERECTOMY  1977   ANAL FISTULECTOMY  1985   ANKLE FRACTURE SURGERY Right 2014   APPENDECTOMY     BIOPSY  02/15/2021   Procedure: BIOPSY;  Surgeon: Lemar Lofty., MD;  Location: [facility];  Service: Gastroenterology;;   BREAST BIOPSY Bilateral 10+ yrs ago   BENIGN   BUNIONECTOMY Left 1975   Left Toe   CATARACT EXTRACTION, BILATERAL  2009   COLONOSCOPY WITH PROPOFOL N/A 02/15/2021   Procedure: COLONOSCOPY WITH PROPOFOL;  Surgeon: Lemar Lofty., MD;  Location: [facility];  Service: Gastroenterology;  Laterality: N/A;  ultraslimscope    DILATION AND CURETTAGE OF UTERUS  1972   eye PRK vision correction Left 10/09/2013   eye socket re-formed Left 2014   FOOT NEUROMA SURGERY Right 2003   plus bunionectomy, right great toe   HAMMER TOE SURGERY Right 2005   Right foot correction hammertoe little toe   HEMORRHOID SURGERY  1967    HIP SURGERY Right 2014   right hip femur pin implanted   KNEE ARTHROSCOPY Left 2010   debridement left knee scar tissue   LAPAROSCOPIC OOPHERECTOMY     1986, 1990   LASIK  1998   NEUROMA SURGERY Right 2003   Right Foot Neuroma, plus Bunionectomy, Right Great Toe   POLYPECTOMY  02/15/2021   Procedure: POLYPECTOMY;  Surgeon: Lemar Lofty., MD;  Location: [facility];  Service: Gastroenterology;;   RECTOCELE REPAIR  1968   REPLACEMENT TOTAL KNEE Left 01/2008   REPLACEMENT TOTAL KNEE Right 08/2008   retinal pucker correction Left 07/14/2012   schwannoma tumor (benign) removal  2011   right rib   SIGMOIDOSCOPY     TONSILLECTOMY AND ADENOIDECTOMY     VENTRAL HERNIA REPAIR  1993   Patient Active Problem List   Diagnosis Date Noted   Chronic pain of right knee 05/27/2021   OSA on CPAP 05/27/2021   LLQ pain 12/16/2020   History of diverticulitis 12/16/2020   Hematochezia 12/16/2020   Abnormal colonoscopy 12/16/2020   Diverticulitis of colon 08/15/2020   Sleep-related hypoxia 08/05/2019   OSA (obstructive sleep apnea) 08/05/2019   PLMD (periodic limb movement disorder) 08/05/2019   Cerebellar 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) 05/06/2019   Excessive daytime sleepiness 05/06/2019   Loud snoring 05/06/2019   Ventral hernia without obstruction or gangrene 02/22/2019   Chronic throat clearing 01/18/2019   Nocturnal cough 01/18/2019   Family history of colon cancer in father 01/18/2019   Diverticulosis 01/18/2019   Gait abnormality 12/27/2018   Numbness 12/27/2018   Status post total bilateral knee replacement 11/30/2018   DDD (degenerative disc disease), cervical 11/30/2018   Status post carpal tunnel release 11/30/2018   DDD (degenerative disc disease), lumbar 10/26/2018   Primary osteoarthritis of both hands 10/26/2018   Primary osteoarthritis of both feet 10/26/2018   Gastroesophageal reflux disease 08/27/2018   Bilateral impacted cerumen 08/21/2018    Sensorineural hearing loss (SNHL) of both ears 08/21/2018   Tinnitus, bilateral 08/21/2018   Blood in urine 04/17/2018   Secondary open-angle glaucoma of left eye, moderate stage 03/10/2018   Early stage 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 10/05/2017   Epiretinal membrane (ERM) of left eye 10/05/2017   Pseudophakia of both eyes 10/05/2017   Osteopenia 03/19/2017   Mixed hyperlipidemia 08/18/2016   Generalized osteoarthritis of multiple sites 08/18/2016   Peripheral neuropathic pain 08/18/2016   Overactive bladder 08/18/2016   Glaucoma 08/18/2016   Cervical disc disorder with radiculopathy of cervical region 08/17/2016   Carpal tunnel syndrome, right upper limb 08/17/2016   Abnormal finding on mammography 06/14/2016   Breast lump 05/06/2015    PCP: Garlan Fillers, MD  REFERRING PROVIDER: Garlan Fillers, MD   REFERRING DIAG: Unsteadiness on feet [R26.81]   THERAPY DIAG:  Muscle weakness (generalized)  Other abnormalities of gait and mobility  Unsteadiness on feet  Rationale for Evaluation and Treatment: Rehabilitation  ONSET DATE: 2 years ago  SUBJECTIVE:   SUBJECTIVE STATEMENT: "My walking is just not right.  My feet don't bend the way they should."  Pt doesn't have any stairs at home.  Pt's daughter lives beside her and she has 2 steps to enter home without a rail.  She has difficulty performing the steps to enter daughter's home.  Pt c/o's of weakness in her left foot. She has difficulty with clearing the floor with her L foot occasionally.  She also reports tripping on rugs. Pt demonstrates difficulty with transfers.  Pt denies any adverse effects after prior Rx.   PERTINENT HISTORY: Arthritis, Osteopenia, Peripheral neuropathy, and bilat TKR.  PAIN:  Are you having pain? No  PRECAUTIONS: None  WEIGHT BEARING RESTRICTIONS: No  FALLS:  Has patient fallen in last 6 months? Yes. Number of falls 2  LIVING ENVIRONMENT: Lives with:  lives alone Lives in: House/apartment Stairs: No Has following equipment at home: Single point cane, shower chair, and Grab bars  OCCUPATION: Retired  PLOF: Independent  PATIENT GOALS: Pt would like to improve her strength in bilat LE's and improve her balance.   NEXT MD VISIT:   OBJECTIVE:   DIAGNOSTIC FINDINGS: None at this time.     TODAY'S TREATMENT:  Therapeutic Exercise: -Reviewed current function, response to prior Rx, and pain level.  Pt performed: Seated marching 2x10 Seated clams with RTB 2x10 Ankle DF with YTB 2x10 bilat Sit to stands Seated Heel raises 2x10 Seated toe raises 2x10  Pt received a HEP handout and was educated in correct form and appropriate frequency.    Neuromuscular Re-education: Standing with FT 2x30 sec Standing with FA with EC 2x20 sec  Standing in staggered stance x20 sec bilat with frequent UE support  Marching on airex x10 reps with UE support Standing on airex with FA 2x30 sec with occasional UE support    PATIENT EDUCATION:  Education details: Educated pt on anatomy and physiology of current symptoms, FOTO, diagnosis, prognosis, HEP,  and POC. Person educated: Patient Education method: Medical illustrator Education comprehension: verbalized understanding and returned demonstration  HOME EXERCISE PROGRAM: Access Code: [email or web address] URL: [email or web address] Date: 11/10/2022 Prepared by: Aaron Edelman  Exercises - Seated March  - 1 x daily - 5-7 x weekly - 2 sets - 10 reps - Seated Toe Raise  - 1 x daily - 7 x weekly - 2 sets - 10 reps - Seated Heel Raise  - 1 x daily - 5-7 x weekly - 2 sets - 10 reps - Long Sitting Ankle Dorsiflexion with Anchored Resistance  - 1 x daily - 3-4 x weekly - 2 sets - 10 reps - Seated Hip Abduction with Resistance  - 1 x daily - 3 x weekly - 2 sets -  10 reps - Romberg Stance  - 1 x daily - 5-7 x weekly - 2-3 reps - 30 seconds hold  ASSESSMENT:  CLINICAL IMPRESSION:  Pt presents to Rx today without an AD and states she only brings her AD for unfamiliar places.  Pt performed exercises to improve bilat LE strength including bilat anterior tib, functional strength, and balance.  Pt had difficulty and increased sway with staggered stance exercise.  PT worked on Comptroller today.  PT educated pt on correct form, appropriate exercises, and rationale of exercises.  Pt received a HEP handout and demonstrated good understanding.  She responded well to Rx having no c/o's after Rx.  She should benefit from continued skilled PT to address ongoing goals and improve balance, strength, and function.   OBJECTIVE IMPAIRMENTS: decreased activity tolerance, difficulty walking, decreased balance, decreased endurance, decreased mobility, decreased ROM, decreased strength, impaired flexibility, impaired UE/LE use, postural dysfunction, and pain.  ACTIVITY LIMITATIONS: bending, lifting, carry, locomotion, cleaning, community activity, driving, and or occupation  PERSONAL FACTORS: Arthritis, Osteopenia, Peripheral neuropathy.  are also affecting patient's functional outcome.  REHAB POTENTIAL: Good  CLINICAL DECISION MAKING: Stable/uncomplicated  EVALUATION COMPLEXITY: Low   GOALS: Short term PT Goals Target date: 12/09/2022 Pt will be I and compliant with HEP. Baseline:  Goal status: New  Long term PT goals Target date: 01/06/2023 Pt will improve BERG by at least 3 points in order to demonstrate clinically significant improvement in balance.   Baseline:  Goal status: New Pt will improve hip/knee strength to at least 5-/5 MMT to improve functional strength Baseline: Goal status: New Pt will report <1 fall during her duration in PT.         Baseline         Goal status: New  Pt will be able to ambulate community distances at least 1000 ft WNL gait  pattern without complaints Baseline: Goal status: New       5. Pt will be able to negotiate  stairs with a reciprocal gait pattern       Baseline:        Goal status: New       6. Pt will improve her STS by 2.3 seconds for Minimal clinically important difference.         Baseline:          Goal status: New   PLAN: PT FREQUENCY: 1-2 times per week   PT DURATION: 6-8 weeks  PLANNED INTERVENTIONS (unless contraindicated): aquatic PT, Canalith repositioning, cryotherapy, Electrical stimulation, Iontophoresis with 4 mg/ml dexamethasome, Moist heat, traction, Ultrasound, gait training, Therapeutic exercise, balance training, neuromuscular re-education, patient/family education, prosthetic training, manual techniques, passive ROM, dry needling, taping, vasopnuematic device, vestibular, spinal manipulations, joint manipulations  PLAN FOR NEXT SESSION: Add standing heel raises and Standing on airex balance next visit.  Cont with LE strengthening, balance, stair negotiation and gait.   Audie Clearoby Tarisa Paola III PT, DPT 11/11/22 3:24 PM

## 2022-11-18 ENCOUNTER — Encounter (HOSPITAL_BASED_OUTPATIENT_CLINIC_OR_DEPARTMENT_OTHER): Payer: Self-pay | Admitting: Physical Therapy

## 2022-11-18 ENCOUNTER — Ambulatory Visit (HOSPITAL_BASED_OUTPATIENT_CLINIC_OR_DEPARTMENT_OTHER): Payer: Medicare Other | Admitting: Physical Therapy

## 2022-11-18 DIAGNOSIS — M6281 Muscle weakness (generalized): Secondary | ICD-10-CM

## 2022-11-18 DIAGNOSIS — R2689 Other abnormalities of gait and mobility: Secondary | ICD-10-CM

## 2022-11-18 DIAGNOSIS — R2681 Unsteadiness on feet: Secondary | ICD-10-CM

## 2022-11-18 NOTE — Therapy (Signed)
OUTPATIENT PHYSICAL THERAPY LOWER EXTREMITY TREATMENT   Patient Name: Hailey Perkins MRN: [id number] DOB:May 09, 1935, 87 y.o., female Today's Date: 11/19/2022  END OF SESSION:  PT End of Session - 11/18/22 1152     Visit Number 3    Number of Visits 8    Date for PT Re-Evaluation 01/03/23    Authorization Type [employer]    PT Start Time 1149    PT Stop Time 1230    PT Time Calculation (min) 41 min    Activity Tolerance Patient tolerated treatment well    Behavior During Therapy WFL for tasks assessed/performed             Past Medical History:  Diagnosis Date   Aortic atherosclerosis (HCC)    Arthritis    Cataract 2010   bilateral eyes   Chicken pox    Cholelithiasis    Diverticulitis    Diverticulosis    GERD (gastroesophageal reflux disease)    Glaucoma    Hiatal hernia    History of frequent urinary tract infections    Hyperlipidemia    Osteopenia    Peripheral neuropathy    Sleep apnea    wears a CPAP   Urine incontinence    Past Surgical History:  Procedure Laterality Date   ABDOMINAL HYSTERECTOMY  1977   ANAL FISTULECTOMY  1985   ANKLE FRACTURE SURGERY Right 2014   APPENDECTOMY     BIOPSY  02/15/2021   Procedure: BIOPSY;  Surgeon: Lemar Lofty., MD;  Location: [facility];  Service: Gastroenterology;;   BREAST BIOPSY Bilateral 10+ yrs ago   BENIGN   BUNIONECTOMY Left 1975   Left Toe   CATARACT EXTRACTION, BILATERAL  2009   COLONOSCOPY WITH PROPOFOL N/A 02/15/2021   Procedure: COLONOSCOPY WITH PROPOFOL;  Surgeon: Lemar Lofty., MD;  Location: [facility];  Service: Gastroenterology;  Laterality: N/A;  ultraslimscope    DILATION AND CURETTAGE OF UTERUS  1972   eye PRK vision correction Left 10/09/2013   eye socket re-formed Left 2014   FOOT NEUROMA SURGERY Right 2003   plus bunionectomy, right great toe   HAMMER TOE SURGERY Right 2005   Right foot correction hammertoe little toe   HEMORRHOID SURGERY  1967    HIP SURGERY Right 2014   right hip femur pin implanted   KNEE ARTHROSCOPY Left 2010   debridement left knee scar tissue   LAPAROSCOPIC OOPHERECTOMY     1986, 1990   LASIK  1998   NEUROMA SURGERY Right 2003   Right Foot Neuroma, plus Bunionectomy, Right Great Toe   POLYPECTOMY  02/15/2021   Procedure: POLYPECTOMY;  Surgeon: Lemar Lofty., MD;  Location: [facility];  Service: Gastroenterology;;   RECTOCELE REPAIR  1968   REPLACEMENT TOTAL KNEE Left 01/2008   REPLACEMENT TOTAL KNEE Right 08/2008   retinal pucker correction Left 07/14/2012   schwannoma tumor (benign) removal  2011   right rib   SIGMOIDOSCOPY     TONSILLECTOMY AND ADENOIDECTOMY     VENTRAL HERNIA REPAIR  1993   Patient Active Problem List   Diagnosis Date Noted   Chronic pain of right knee 05/27/2021   OSA on CPAP 05/27/2021   LLQ pain 12/16/2020   History of diverticulitis 12/16/2020   Hematochezia 12/16/2020   Abnormal colonoscopy 12/16/2020   Diverticulitis of colon 08/15/2020   Sleep-related hypoxia 08/05/2019   OSA (obstructive sleep apnea) 08/05/2019   PLMD (periodic limb movement disorder) 08/05/2019   Cerebellar 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) 05/06/2019   Excessive daytime sleepiness 05/06/2019   Loud snoring 05/06/2019   Ventral hernia without obstruction or gangrene 02/22/2019   Chronic throat clearing 01/18/2019   Nocturnal cough 01/18/2019   Family history of colon cancer in father 01/18/2019   Diverticulosis 01/18/2019   Gait abnormality 12/27/2018   Numbness 12/27/2018   Status post total bilateral knee replacement 11/30/2018   DDD (degenerative disc disease), cervical 11/30/2018   Status post carpal tunnel release 11/30/2018   DDD (degenerative disc disease), lumbar 10/26/2018   Primary osteoarthritis of both hands 10/26/2018   Primary osteoarthritis of both feet 10/26/2018   Gastroesophageal reflux disease 08/27/2018   Bilateral impacted cerumen 08/21/2018    Sensorineural hearing loss (SNHL) of both ears 08/21/2018   Tinnitus, bilateral 08/21/2018   Blood in urine 04/17/2018   Secondary open-angle glaucoma of left eye, moderate stage 03/10/2018   Early stage 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 10/05/2017   Epiretinal membrane (ERM) of left eye 10/05/2017   Pseudophakia of both eyes 10/05/2017   Osteopenia 03/19/2017   Mixed hyperlipidemia 08/18/2016   Generalized osteoarthritis of multiple sites 08/18/2016   Peripheral neuropathic pain 08/18/2016   Overactive bladder 08/18/2016   Glaucoma 08/18/2016   Cervical disc disorder with radiculopathy of cervical region 08/17/2016   Carpal tunnel syndrome, right upper limb 08/17/2016   Abnormal finding on mammography 06/14/2016   Breast lump 05/06/2015    PCP: Garlan Fillers, MD  REFERRING PROVIDER: Garlan Fillers, MD   REFERRING DIAG: Unsteadiness on feet [R26.81]   THERAPY DIAG:  Muscle weakness (generalized)  Other abnormalities of gait and mobility  Unsteadiness on feet  Rationale for Evaluation and Treatment: Rehabilitation  ONSET DATE: 2 years ago  SUBJECTIVE:   SUBJECTIVE STATEMENT: Pt denies any adverse effects after prior Rx.  Pt reports compliance with HEP though was unable to perform DF with YTB due to not finding a place to tie band on.  She has difficulty performing the steps to enter daughter's home.  Pt c/o's of weakness in her left foot. She has difficulty with clearing the floor with her L foot occasionally.  She also reports tripping on rugs.  Pt has difficulty with transfers.  PERTINENT HISTORY: Arthritis, Osteopenia, Peripheral neuropathy, and bilat TKR.  PAIN:  Are you having pain? No  PRECAUTIONS: None  WEIGHT BEARING RESTRICTIONS: No  FALLS:  Has patient fallen in last 6 months? Yes. Number of falls 2  LIVING ENVIRONMENT: Lives with: lives alone Lives in: House/apartment Stairs: No Has following equipment at home:  Single point cane, shower chair, and Grab bars  OCCUPATION: Retired  PLOF: Independent  PATIENT GOALS: Pt would like to improve her strength in bilat LE's and improve her balance.   NEXT MD VISIT:   OBJECTIVE:   DIAGNOSTIC FINDINGS: None at this time.     TODAY'S TREATMENT:  Therapeutic Exercise: -Reviewed current function, response to prior Rx, and pain level.  Pt performed: Seated marching 2x10 Seated clams with RTB 2x10 Ankle DF with YTB 2x10 bilat Sit to stands x 8 and x 10 Seated Heel raises 2x10 Seated toe raises 2x10  Reviewed HEP.    Neuromuscular Re-education: Standing in staggered stance 2x20 sec bilat with frequent UE support  Marching on airex 2x10 reps with UE support Standing on airex with FA 3x30 sec and with NBOS 2x30 sec with occasional UE support Sidestepping with UE's on rail x 2 laps    PATIENT EDUCATION:  Education details: Educated pt on anatomy and physiology of current symptoms, FOTO, diagnosis, prognosis, HEP,  and POC. Person educated: Patient Education method: Customer service manager Education comprehension: verbalized understanding and returned demonstration  HOME EXERCISE PROGRAM: Access Code: [email or web address] URL: [email or web address] Date: 11/10/2022 Prepared by: Ronny Flurry  Exercises - Seated March  - 1 x daily - 5-7 x weekly - 2 sets - 10 reps - Seated Toe Raise  - 1 x daily - 7 x weekly - 2 sets - 10 reps - Seated Heel Raise  - 1 x daily - 5-7 x weekly - 2 sets - 10 reps - Long Sitting Ankle Dorsiflexion with Anchored Resistance  - 1 x daily - 3-4 x weekly - 2 sets - 10 reps - Seated Hip Abduction with Resistance  - 1 x daily - 3 x weekly - 2 sets - 10 reps - Romberg Stance  - 1 x daily - 5-7 x weekly - 2-3 reps - 30 seconds hold  ASSESSMENT:  CLINICAL IMPRESSION:  PT reviewed HEP  and instructed pt in correct form.  Pt has limited AROM with seated toe raises.  Pt is unable to perform DF with foot on floor with YTB around table leg.  Pt performed there ex and neuro re-ed activities well with instruction and cuing for correct form.  She responded well to Rx having no c/o's after Rx.  Pt reports no pain and states she was fatigued after Rx.  She should benefit from continued skilled PT to address ongoing goals and improve balance, strength, and function.   OBJECTIVE IMPAIRMENTS: decreased activity tolerance, difficulty walking, decreased balance, decreased endurance, decreased mobility, decreased ROM, decreased strength, impaired flexibility, impaired UE/LE use, postural dysfunction, and pain.  ACTIVITY LIMITATIONS: bending, lifting, carry, locomotion, cleaning, community activity, driving, and or occupation  PERSONAL FACTORS: Arthritis, Osteopenia, Peripheral neuropathy.  are also affecting patient's functional outcome.  REHAB POTENTIAL: Good  CLINICAL DECISION MAKING: Stable/uncomplicated  EVALUATION COMPLEXITY: Low   GOALS: Short term PT Goals Target date: 12/17/2022 Pt will be I and compliant with HEP. Baseline:  Goal status: New  Long term PT goals Target date: 01/14/2023 Pt will improve BERG by at least 3 points in order to demonstrate clinically significant improvement in balance.   Baseline:  Goal status: New Pt will improve hip/knee strength to at least 5-/5 MMT to improve functional strength Baseline: Goal status: New Pt will report <1 fall during her duration in PT.         Baseline         Goal status: New  Pt will be able to ambulate community distances at least 1000 ft WNL gait pattern without complaints Baseline: Goal status: New       5. Pt will be able to negotiate stairs with a reciprocal gait pattern       Baseline:        Goal  status: New       6. Pt will improve her STS by 2.3 seconds for Minimal clinically important difference.          Baseline:          Goal status: New   PLAN: PT FREQUENCY: 1-2 times per week   PT DURATION: 6-8 weeks  PLANNED INTERVENTIONS (unless contraindicated): aquatic PT, Canalith repositioning, cryotherapy, Electrical stimulation, Iontophoresis with 4 mg/ml dexamethasome, Moist heat, traction, Ultrasound, gait training, Therapeutic exercise, balance training, neuromuscular re-education, patient/family education, prosthetic training, manual techniques, passive ROM, dry needling, taping, vasopnuematic device, vestibular, spinal manipulations, joint manipulations  PLAN FOR NEXT SESSION: Add standing heel raises.  Cont with LE strengthening, balance, stair negotiation and gait.   Selinda Michaels III PT, DPT 11/19/22 7:19 AM

## 2022-11-22 ENCOUNTER — Ambulatory Visit (HOSPITAL_BASED_OUTPATIENT_CLINIC_OR_DEPARTMENT_OTHER): Payer: Medicare Other | Admitting: Physical Therapy

## 2022-11-22 DIAGNOSIS — R2681 Unsteadiness on feet: Secondary | ICD-10-CM

## 2022-11-22 DIAGNOSIS — M6281 Muscle weakness (generalized): Secondary | ICD-10-CM

## 2022-11-22 DIAGNOSIS — R2689 Other abnormalities of gait and mobility: Secondary | ICD-10-CM

## 2022-11-22 NOTE — Therapy (Signed)
OUTPATIENT PHYSICAL THERAPY LOWER EXTREMITY TREATMENT   Patient Name: Hailey Perkins MRN: [id number] DOB:[birth year], 87 y.o., female Today's Date: 11/23/2022  END OF SESSION:  PT End of Session - 11/22/22       Visit Number 34    Number of Visits 8     Date for PT Re-Evaluation 01/03/23     Authorization Type [employer]     PT Start Time 1152    PT Stop Time 1231     PT Time Calculation (min) 39 min     Activity Tolerance Patient tolerated treatment well     Behavior During Therapy WFL for tasks assessed/performed      Past Medical History:  Diagnosis Date   Aortic atherosclerosis (HCC)    Arthritis    Cataract 2010   bilateral eyes   Chicken pox    Cholelithiasis    Diverticulitis    Diverticulosis    GERD (gastroesophageal reflux disease)    Glaucoma    Hiatal hernia    History of frequent urinary tract infections    Hyperlipidemia    Osteopenia    Peripheral neuropathy    Sleep apnea    wears a CPAP   Urine incontinence    Past Surgical History:  Procedure Laterality Date   ABDOMINAL HYSTERECTOMY  1977   ANAL FISTULECTOMY  1985   ANKLE FRACTURE SURGERY Right 2014   APPENDECTOMY     BIOPSY  02/15/2021   Procedure: BIOPSY;  Surgeon: Lemar Lofty., MD;  Location: [facility];  Service: Gastroenterology;;   BREAST BIOPSY Bilateral 10+ yrs ago   BENIGN   BUNIONECTOMY Left 1975   Left Toe   CATARACT EXTRACTION, BILATERAL  2009   COLONOSCOPY WITH PROPOFOL N/A 02/15/2021   Procedure: COLONOSCOPY WITH PROPOFOL;  Surgeon: Lemar Lofty., MD;  Location: [facility];  Service: Gastroenterology;  Laterality: N/A;  ultraslimscope    DILATION AND CURETTAGE OF UTERUS  1972   eye PRK vision correction Left 10/09/2013   eye socket re-formed Left 2014   FOOT NEUROMA SURGERY Right 2003   plus bunionectomy, right great toe   HAMMER TOE SURGERY Right 2005   Right foot correction hammertoe little toe   HEMORRHOID SURGERY  1967    HIP SURGERY Right 2014   right hip femur pin implanted   KNEE ARTHROSCOPY Left 2010   debridement left knee scar tissue   LAPAROSCOPIC OOPHERECTOMY     1986, 1990   LASIK  1998   NEUROMA SURGERY Right 2003   Right Foot Neuroma, plus Bunionectomy, Right Great Toe   POLYPECTOMY  02/15/2021   Procedure: POLYPECTOMY;  Surgeon: Lemar Lofty., MD;  Location: [facility];  Service: Gastroenterology;;   RECTOCELE REPAIR  1968   REPLACEMENT TOTAL KNEE Left 01/2008   REPLACEMENT TOTAL KNEE Right 08/2008   retinal pucker correction Left 07/14/2012   schwannoma tumor (benign) removal  2011   right rib   SIGMOIDOSCOPY     TONSILLECTOMY AND ADENOIDECTOMY     VENTRAL HERNIA REPAIR  1993   Patient Active Problem List   Diagnosis Date Noted   Chronic pain of right knee 05/27/2021   OSA on CPAP 05/27/2021   LLQ pain 12/16/2020   History of diverticulitis 12/16/2020   Hematochezia 12/16/2020   Abnormal colonoscopy 12/16/2020   Diverticulitis of colon 08/15/2020   Sleep-related hypoxia 08/05/2019   OSA (obstructive sleep apnea) 08/05/2019   PLMD (periodic limb movement disorder) 08/05/2019   Cerebellar 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) 05/06/2019   Excessive daytime sleepiness 05/06/2019   Loud snoring 05/06/2019   Ventral hernia without obstruction or gangrene 02/22/2019   Chronic throat clearing 01/18/2019   Nocturnal cough 01/18/2019   Family history of colon cancer in father 01/18/2019   Diverticulosis 01/18/2019   Gait abnormality 12/27/2018   Numbness 12/27/2018   Status post total bilateral knee replacement 11/30/2018   DDD (degenerative disc disease), cervical 11/30/2018   Status post carpal tunnel release 11/30/2018   DDD (degenerative disc disease), lumbar 10/26/2018   Primary osteoarthritis of both hands 10/26/2018   Primary osteoarthritis of both feet 10/26/2018   Gastroesophageal reflux disease 08/27/2018   Bilateral impacted cerumen 08/21/2018    Sensorineural hearing loss (SNHL) of both ears 08/21/2018   Tinnitus, bilateral 08/21/2018   Blood in urine 04/17/2018   Secondary open-angle glaucoma of left eye, moderate stage 03/10/2018   Early stage 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 10/05/2017   Epiretinal membrane (ERM) of left eye 10/05/2017   Pseudophakia of both eyes 10/05/2017   Osteopenia 03/19/2017   Mixed hyperlipidemia 08/18/2016   Generalized osteoarthritis of multiple sites 08/18/2016   Peripheral neuropathic pain 08/18/2016   Overactive bladder 08/18/2016   Glaucoma 08/18/2016   Cervical disc disorder with radiculopathy of cervical region 08/17/2016   Carpal tunnel syndrome, right upper limb 08/17/2016   Abnormal finding on mammography 06/14/2016   Breast lump 05/06/2015    PCP: Garlan Fillers, MD  REFERRING PROVIDER: Garlan Fillers, MD   REFERRING DIAG: Unsteadiness on feet [R26.81]   THERAPY DIAG:  Muscle weakness (generalized)  Other abnormalities of gait and mobility  Unsteadiness on feet  Rationale for Evaluation and Treatment: Rehabilitation  ONSET DATE: 2 years ago  SUBJECTIVE:   SUBJECTIVE STATEMENT: Pt denies any adverse effects after prior Rx.  Pt reports compliance with HEP though was unable to perform DF with YTB due to not finding a place to tie band on.  She has difficulty performing the steps to enter daughter's home.  Pt c/o's of weakness in her left foot. She has difficulty with clearing the floor with her L foot occasionally.  She also reports tripping on rugs.  Pt has difficulty with transfers.  PERTINENT HISTORY: Arthritis, Osteopenia, Peripheral neuropathy, and bilat TKR.  PAIN:  Are you having pain? No  PRECAUTIONS: None  WEIGHT BEARING RESTRICTIONS: No  FALLS:  Has patient fallen in last 6 months? Yes. Number of falls 2  LIVING ENVIRONMENT: Lives with: lives alone Lives in: House/apartment Stairs: No Has following equipment at home:  Single point cane, shower chair, and Grab bars  OCCUPATION: Retired  PLOF: Independent  PATIENT GOALS: Pt would like to improve her strength in bilat LE's and improve her balance.   NEXT MD VISIT:   OBJECTIVE:   DIAGNOSTIC FINDINGS: None at this time.     TODAY'S TREATMENT:  Therapeutic Exercise: -Reviewed current function, response to prior Rx, and pain level.  Pt performed: Nustep at L3 x 5 mins with bilat UE/LE Ankle DF with YTB 3x10 bilat Sit to stands from chair x 8 and from elevated table 2 x 10 LAQ with 3# 2x10 reps Standing heel raises 2x5    Neuromuscular Re-education: Standing in staggered stance 2x20-30 sec bilat with frequent UE support  Marching on airex 2x10 reps with UE support Standing on airex with FA 2x30 sec and with NBOS 2x30 sec with occasional UE support with SBA/CGA Sidestepping with occasional UE's on rail with CGA x 2 laps    PATIENT EDUCATION:  Education details: Educated pt on anatomy and physiology of current symptoms, FOTO, diagnosis, prognosis, HEP,  and POC. Person educated: Patient Education method: Medical illustratorxplanation and Demonstration Education comprehension: verbalized understanding and returned demonstration  HOME EXERCISE PROGRAM: Access Code: [email or web address] URL: [email or web address] Date: 11/10/2022 Prepared by: Aaron Edelmanrey Shaye Lagace  Exercises - Seated March  - 1 x daily - 5-7 x weekly - 2 sets - 10 reps - Seated Toe Raise  - 1 x daily - 7 x weekly - 2 sets - 10 reps - Seated Heel Raise  - 1 x daily - 5-7 x weekly - 2 sets - 10 reps - Long Sitting Ankle Dorsiflexion with Anchored Resistance  - 1 x daily - 3-4 x weekly - 2 sets - 10 reps - Seated Hip Abduction with Resistance  - 1 x daily - 3 x weekly - 2 sets - 10 reps - Romberg Stance  - 1 x daily - 5-7 x weekly - 2-3 reps - 30 seconds  hold  ASSESSMENT:  CLINICAL IMPRESSION:  PT worked on sit to stands.  Pt unable to perform sit to stands without UE support from her chair.  PT had pt perform from an elevated table.  Pt has decreased functional LE strength and requires instruction in correct form and positioning.  Pt states her R knee bothered her a little with the Nustep.  Pt has weakness in bilat anterior tib.  Pt responded well to Rx having no c/o's though was fatigued after Rx.  She should benefit from continued skilled PT to address ongoing goals and improve balance, strength, and function.   OBJECTIVE IMPAIRMENTS: decreased activity tolerance, difficulty walking, decreased balance, decreased endurance, decreased mobility, decreased ROM, decreased strength, impaired flexibility, impaired UE/LE use, postural dysfunction, and pain.  ACTIVITY LIMITATIONS: bending, lifting, carry, locomotion, cleaning, community activity, driving, and or occupation  PERSONAL FACTORS: Arthritis, Osteopenia, Peripheral neuropathy.  are also affecting patient's functional outcome.  REHAB POTENTIAL: Good  CLINICAL DECISION MAKING: Stable/uncomplicated  EVALUATION COMPLEXITY: Low   GOALS: Short term PT Goals Target date: 12/21/2022 Pt will be I and compliant with HEP. Baseline:  Goal status: New  Long term PT goals Target date: 01/18/2023 Pt will improve BERG by at least 3 points in order to demonstrate clinically significant improvement in balance.   Baseline:  Goal status: New Pt will improve hip/knee strength to at least 5-/5 MMT to improve functional strength Baseline: Goal status: New Pt will report <1 fall during her duration in PT.         Baseline         Goal status: New  Pt will be able to ambulate community distances at least 1000 ft WNL gait pattern without complaints Baseline: Goal status: New       5. Pt will be able to negotiate stairs with a reciprocal gait pattern  Baseline:        Goal status: New       6.  Pt will improve her STS by 2.3 seconds for Minimal clinically important difference.         Baseline:          Goal status: New   PLAN: PT FREQUENCY: 1-2 times per week   PT DURATION: 6-8 weeks  PLANNED INTERVENTIONS (unless contraindicated): aquatic PT, Canalith repositioning, cryotherapy, Electrical stimulation, Iontophoresis with 4 mg/ml dexamethasome, Moist heat, traction, Ultrasound, gait training, Therapeutic exercise, balance training, neuromuscular re-education, patient/family education, prosthetic training, manual techniques, passive ROM, dry needling, taping, vasopnuematic device, vestibular, spinal manipulations, joint manipulations  PLAN FOR NEXT SESSION:  Cont with LE strengthening, balance, stair negotiation and gait.   Audie Clear III PT, DPT 11/23/22 10:22 PM

## 2022-11-23 ENCOUNTER — Encounter (HOSPITAL_BASED_OUTPATIENT_CLINIC_OR_DEPARTMENT_OTHER): Payer: Self-pay | Admitting: Physical Therapy

## 2022-11-25 ENCOUNTER — Encounter (HOSPITAL_BASED_OUTPATIENT_CLINIC_OR_DEPARTMENT_OTHER): Payer: Medicare Other | Admitting: Physical Therapy

## 2022-12-02 ENCOUNTER — Ambulatory Visit (HOSPITAL_BASED_OUTPATIENT_CLINIC_OR_DEPARTMENT_OTHER): Payer: Medicare Other | Admitting: Physical Therapy

## 2022-12-02 ENCOUNTER — Encounter (HOSPITAL_BASED_OUTPATIENT_CLINIC_OR_DEPARTMENT_OTHER): Payer: Self-pay | Admitting: Physical Therapy

## 2022-12-02 DIAGNOSIS — M6281 Muscle weakness (generalized): Secondary | ICD-10-CM

## 2022-12-02 DIAGNOSIS — R2689 Other abnormalities of gait and mobility: Secondary | ICD-10-CM

## 2022-12-02 DIAGNOSIS — R2681 Unsteadiness on feet: Secondary | ICD-10-CM

## 2022-12-02 NOTE — Therapy (Signed)
OUTPATIENT PHYSICAL THERAPY LOWER EXTREMITY TREATMENT   Patient Name: Hailey Perkins MRN: [id number] DOB:August 29, 1935, 87 y.o., female Today's Date: 12/02/2022  END OF SESSION:  PT End of Session - 12/02/22 1158     Visit Number 5    Number of Visits 8    Date for PT Re-Evaluation 01/03/23    Authorization Type [employer]ICARE    PT Start Time 1152    PT Stop Time 1231    PT Time Calculation (min) 39 min    Activity Tolerance Patient tolerated treatment well    Behavior During Therapy WFL for tasks assessed/performed               Past Medical History:  Diagnosis Date   Aortic atherosclerosis (HCC)    Arthritis    Cataract 2010   bilateral eyes   Chicken pox    Cholelithiasis    Diverticulitis    Diverticulosis    GERD (gastroesophageal reflux disease)    Glaucoma    Hiatal hernia    History of frequent urinary tract infections    Hyperlipidemia    Osteopenia    Peripheral neuropathy    Sleep apnea    wears a CPAP   Urine incontinence    Past Surgical History:  Procedure Laterality Date   ABDOMINAL HYSTERECTOMY  1977   ANAL FISTULECTOMY  1985   ANKLE FRACTURE SURGERY Right 2014   APPENDECTOMY     BIOPSY  02/15/2021   Procedure: BIOPSY;  Surgeon: Lemar Lofty., MD;  Location: [facility];  Service: Gastroenterology;;   BREAST BIOPSY Bilateral 10+ yrs ago   BENIGN   BUNIONECTOMY Left 1975   Left Toe   CATARACT EXTRACTION, BILATERAL  2009   COLONOSCOPY WITH PROPOFOL N/A 02/15/2021   Procedure: COLONOSCOPY WITH PROPOFOL;  Surgeon: Lemar Lofty., MD;  Location: [facility];  Service: Gastroenterology;  Laterality: N/A;  ultraslimscope    DILATION AND CURETTAGE OF UTERUS  1972   eye [place] vision correction Left 10/09/2013   eye socket re-formed Left 2014   FOOT NEUROMA SURGERY Right 2003   plus bunionectomy, right great toe   HAMMER TOE SURGERY Right 2005   Right foot correction hammertoe little toe   HEMORRHOID SURGERY   1967   HIP SURGERY Right 2014   right hip femur pin implanted   KNEE ARTHROSCOPY Left 2010   debridement left knee scar tissue   LAPAROSCOPIC OOPHERECTOMY     1986, 1990   LASIK  1998   NEUROMA SURGERY Right 2003   Right Foot Neuroma, plus Bunionectomy, Right Great Toe   POLYPECTOMY  02/15/2021   Procedure: POLYPECTOMY;  Surgeon: Lemar Lofty., MD;  Location: [facility];  Service: Gastroenterology;;   RECTOCELE REPAIR  1968   REPLACEMENT TOTAL KNEE Left 01/2008   REPLACEMENT TOTAL KNEE Right 08/2008   retinal pucker correction Left 07/14/2012   schwannoma tumor (benign) removal  2011   right rib   SIGMOIDOSCOPY     TONSILLECTOMY AND ADENOIDECTOMY     VENTRAL HERNIA REPAIR  1993   Patient Active Problem List   Diagnosis Date Noted   Chronic pain of right knee 05/27/2021   OSA on CPAP 05/27/2021   LLQ pain 12/16/2020   History of diverticulitis 12/16/2020   Hematochezia 12/16/2020   Abnormal colonoscopy 12/16/2020   Diverticulitis of colon 08/15/2020   Sleep-related hypoxia 08/05/2019   OSA (obstructive sleep apnea) 08/05/2019   PLMD (periodic limb movement disorder) 08/05/2019   Cerebellar ataxia  in diseases classified elsewhere ([place]) 05/06/2019   Excessive daytime sleepiness 05/06/2019   Loud snoring 05/06/2019   Ventral hernia without obstruction or gangrene 02/22/2019   Chronic throat clearing 01/18/2019   Nocturnal cough 01/18/2019   Family history of colon cancer in father 01/18/2019   Diverticulosis 01/18/2019   Gait abnormality 12/27/2018   Numbness 12/27/2018   Status post total bilateral knee replacement 11/30/2018   DDD (degenerative disc disease), cervical 11/30/2018   Status post carpal tunnel release 11/30/2018   DDD (degenerative disc disease), lumbar 10/26/2018   Primary osteoarthritis of both hands 10/26/2018   Primary osteoarthritis of both feet 10/26/2018   Gastroesophageal reflux disease 08/27/2018   Bilateral impacted cerumen  08/21/2018   Sensorineural hearing loss (SNHL) of both ears 08/21/2018   Tinnitus, bilateral 08/21/2018   Blood in urine 04/17/2018   Secondary open-angle glaucoma of left eye, moderate stage 03/10/2018   Early stage 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 10/05/2017   Epiretinal membrane (ERM) of left eye 10/05/2017   Pseudophakia of both eyes 10/05/2017   Osteopenia 03/19/2017   Mixed hyperlipidemia 08/18/2016   Generalized osteoarthritis of multiple sites 08/18/2016   Peripheral neuropathic pain 08/18/2016   Overactive bladder 08/18/2016   Glaucoma 08/18/2016   Cervical disc disorder with radiculopathy of cervical region 08/17/2016   Carpal tunnel syndrome, right upper limb 08/17/2016   Abnormal finding on mammography 06/14/2016   Breast lump 05/06/2015    PCP: Donnajean Lopes, MD  REFERRING PROVIDER: Donnajean Lopes, MD   REFERRING DIAG: Unsteadiness on feet [R26.81]   THERAPY DIAG:  Muscle weakness (generalized)  Other abnormalities of gait and mobility  Unsteadiness on feet  Rationale for Evaluation and Treatment: Rehabilitation  ONSET DATE: 2 years ago  SUBJECTIVE:   SUBJECTIVE STATEMENT: Pt denies any adverse effects after prior Rx and did have soreness in inner thighs.  Pt has been out of town for the holidays and has not performed her home exercises.  Pt denies any functional improvements.  She has difficulty performing the steps to enter daughter's home.  Pt c/o's of weakness in her left foot. She has difficulty with clearing the floor with her L foot occasionally.  She also reports tripping on rugs.  Pt has difficulty with transfers.  PERTINENT HISTORY: Arthritis, Osteopenia, Peripheral neuropathy, and bilat TKR.  PAIN:  Are you having pain? No  PRECAUTIONS: None  WEIGHT BEARING RESTRICTIONS: No  FALLS:  Has patient fallen in last 6 months? Yes. Number of falls 2  LIVING ENVIRONMENT: Lives with: lives alone Lives in:  House/apartment Stairs: No Has following equipment at home: Single point cane, shower chair, and Grab bars  OCCUPATION: Retired  PLOF: Independent  PATIENT GOALS: Pt would like to improve her strength in bilat LE's and improve her balance.   NEXT MD VISIT:   OBJECTIVE:   DIAGNOSTIC FINDINGS: None at this time.     TODAY'S TREATMENT:  Therapeutic Exercise: -Reviewed current function, response to prior Rx, and pain level.  Pt performed: Ankle DF with YTB 3x10 bilat Sit to stands from chair from elevated table 2 x 10 without UE support except for a few reps of light support LAQ with 3# x10 reps and 4# 2x10 Standing heel raises 2x5  Pt unable to perform standing toe raises  Neuromuscular Re-education: Standing in staggered stance 2x30 sec bilat with frequent UE support  with CGA/minA Marching on airex 2x10 reps with UE support Standing on airex with FA 2x30 sec and with NBOS 2x30 sec with occasional UE support with CGA/minA Sidestepping with occasional UE's on rail with CGA x 2 laps Ambulating bwd with frequent UE support on rail with CGA x 2 laps Stepping over 2 hurdles with UE support on rail and CGA    PATIENT EDUCATION:  Education details: exercise form and rationale, HEP,  and POC. Person educated: Patient Education method: Customer service manager Education comprehension: verbalized understanding and returned demonstration  HOME EXERCISE PROGRAM: Access Code: [email or web address] URL: [email or web address] Date: 11/10/2022 Prepared by: Ronny Flurry  Exercises - Seated March  - 1 x daily - 5-7 x weekly - 2 sets - 10 reps - Seated Toe Raise  - 1 x daily - 7 x weekly - 2 sets - 10 reps - Seated Heel Raise  - 1 x daily - 5-7 x weekly - 2 sets - 10 reps - Long Sitting Ankle Dorsiflexion with Anchored Resistance  - 1 x daily - 3-4 x  weekly - 2 sets - 10 reps - Seated Hip Abduction with Resistance  - 1 x daily - 3 x weekly - 2 sets - 10 reps - Romberg Stance  - 1 x daily - 5-7 x weekly - 2-3 reps - 30 seconds hold  ASSESSMENT:  CLINICAL IMPRESSION:  Pt denies any functional improvements.  She continues to have difficulty with performing transfer and stairs.  PT worked on sit to stands having pt perform from an elevated table.  She continues to have difficulty with sit to stands but is improving.  Pt continues to have weakness with bilat DF.  She fatigues with YTB DF and is unable to perform standing toe raises.  Pt performed exercises well.  She did require the rail at times with sidestepping for LOB.  PT closely guarded with ambulation bwds and she used the rail for UE support.  Pt has decreased step length with amb bwds.  Pt responded well to Rx having no c/o's though was fatigued after Rx.  She should benefit from continued skilled PT to address ongoing goals and improve balance, strength, and function.     OBJECTIVE IMPAIRMENTS: decreased activity tolerance, difficulty walking, decreased balance, decreased endurance, decreased mobility, decreased ROM, decreased strength, impaired flexibility, impaired UE/LE use, postural dysfunction, and pain.  ACTIVITY LIMITATIONS: bending, lifting, carry, locomotion, cleaning, community activity, driving, and or occupation  PERSONAL FACTORS: Arthritis, Osteopenia, Peripheral neuropathy.  are also affecting patient's functional outcome.  REHAB POTENTIAL: Good  CLINICAL DECISION MAKING: Stable/uncomplicated  EVALUATION COMPLEXITY: Low   GOALS: Short term PT Goals Target date: 12/30/2022 Pt will be I and compliant with HEP. Baseline:  Goal status: New  Long term PT goals Target date: 01/27/2023 Pt will improve BERG by at least 3 points in order to demonstrate clinically significant improvement in balance.   Baseline:  Goal status: New Pt will improve hip/knee strength to at  least 5-/5 MMT to improve functional strength Baseline: Goal status: New  Pt will report <1 fall during her duration in PT.         Baseline         Goal status: New  Pt will be able to ambulate community distances at least 1000 ft WNL gait pattern without complaints Baseline: Goal status: New       5. Pt will be able to negotiate stairs with a reciprocal gait pattern       Baseline:        Goal status: New       6. Pt will improve her STS by 2.3 seconds for Minimal clinically important difference.         Baseline:          Goal status: New   PLAN: PT FREQUENCY: 1-2 times per week   PT DURATION: 6-8 weeks  PLANNED INTERVENTIONS (unless contraindicated): aquatic PT, Canalith repositioning, cryotherapy, Electrical stimulation, Iontophoresis with 4 mg/ml dexamethasome, Moist heat, traction, Ultrasound, gait training, Therapeutic exercise, balance training, neuromuscular re-education, patient/family education, prosthetic training, manual techniques, passive ROM, dry needling, taping, vasopnuematic device, vestibular, spinal manipulations, joint manipulations  PLAN FOR NEXT SESSION:  Cont with LE strengthening, balance, stair negotiation and gait.   Selinda Michaels III PT, DPT 12/02/22 6:21 PM

## 2022-12-09 ENCOUNTER — Encounter (HOSPITAL_BASED_OUTPATIENT_CLINIC_OR_DEPARTMENT_OTHER): Payer: Self-pay | Admitting: Physical Therapy

## 2022-12-09 ENCOUNTER — Ambulatory Visit (HOSPITAL_BASED_OUTPATIENT_CLINIC_OR_DEPARTMENT_OTHER): Payer: Medicare Other | Attending: Internal Medicine | Admitting: Physical Therapy

## 2022-12-09 DIAGNOSIS — M6281 Muscle weakness (generalized): Secondary | ICD-10-CM

## 2022-12-09 DIAGNOSIS — R2689 Other abnormalities of gait and mobility: Secondary | ICD-10-CM | POA: Diagnosis present

## 2022-12-09 DIAGNOSIS — R2681 Unsteadiness on feet: Secondary | ICD-10-CM

## 2022-12-09 NOTE — Therapy (Signed)
OUTPATIENT PHYSICAL THERAPY LOWER EXTREMITY TREATMENT   Patient Name: Hailey Perkins MRN: [id number] DOB:[birth year], 88 y.o., female Today's Date: 12/09/2022  END OF SESSION:  PT End of Session - 12/09/22 1325     Visit Number 6    Number of Visits 8    Date for PT Re-Evaluation 01/03/23    Authorization Type [employer] MEDICARE    PT Start Time 1322    PT Stop Time 1401    PT Time Calculation (min) 39 min    Activity Tolerance Patient tolerated treatment well    Behavior During Therapy WFL for tasks assessed/performed               Past Medical History:  Diagnosis Date   Aortic atherosclerosis ([place])    Arthritis    Cataract 2010   bilateral eyes   Chicken pox    Cholelithiasis    Diverticulitis    Diverticulosis    GERD (gastroesophageal reflux disease)    Glaucoma    Hiatal hernia    History of frequent urinary tract infections    Hyperlipidemia    Osteopenia    Peripheral neuropathy    Sleep apnea    wears a CPAP   Urine incontinence    Past Surgical History:  Procedure Laterality Date   ABDOMINAL HYSTERECTOMY  1977   ANAL FISTULECTOMY  1985   ANKLE FRACTURE SURGERY Right 2014   APPENDECTOMY     BIOPSY  02/15/2021   Procedure: BIOPSY;  Surgeon: Irving Copas., MD;  Location: [facility];  Service: Gastroenterology;;   BREAST BIOPSY Bilateral 10+ yrs ago   BENIGN   BUNIONECTOMY Left 1975   Left Toe   CATARACT EXTRACTION, BILATERAL  2009   COLONOSCOPY WITH PROPOFOL N/A 02/15/2021   Procedure: COLONOSCOPY WITH PROPOFOL;  Surgeon: Irving Copas., MD;  Location: [facility];  Service: Gastroenterology;  Laterality: N/A;  ultraslimscope    DILATION AND CURETTAGE OF UTERUS  1972   eye [place] vision correction Left 10/09/2013   eye socket re-formed Left 2014   FOOT NEUROMA SURGERY Right 2003   plus bunionectomy, right great toe   HAMMER TOE SURGERY Right 2005   Right foot correction hammertoe little toe   HEMORRHOID SURGERY   1967   HIP SURGERY Right 2014   right hip femur pin implanted   KNEE ARTHROSCOPY Left 2010   debridement left knee scar tissue   LAPAROSCOPIC OOPHERECTOMY     1986, [place] Right 2003   Right Foot Neuroma, plus Bunionectomy, Right Great Toe   POLYPECTOMY  02/15/2021   Procedure: POLYPECTOMY;  Surgeon: Irving Copas., MD;  Location: [facility];  Service: Gastroenterology;;   [place] Left 01/2008   REPLACEMENT TOTAL KNEE Right 08/2008   retinal pucker correction Left 07/14/2012   schwannoma tumor (benign) removal  2011   right rib   [place]   Patient Active Problem List   Diagnosis Date Noted   Chronic pain of right knee 05/27/2021   OSA on CPAP 05/27/2021   LLQ pain 12/16/2020   History of diverticulitis 12/16/2020   Hematochezia 12/16/2020   Abnormal colonoscopy 12/16/2020   Diverticulitis of colon 08/15/2020   Sleep-related hypoxia 08/05/2019   OSA (obstructive sleep apnea) 08/05/2019   PLMD (periodic limb movement disorder) 08/05/2019   Cerebellar ataxia  in diseases classified elsewhere ([place]) 05/06/2019   Excessive daytime sleepiness 05/06/2019   Loud snoring 05/06/2019   Ventral hernia without obstruction or gangrene 02/22/2019   Chronic throat clearing 01/18/2019   Nocturnal cough 01/18/2019   Family history of colon cancer in father 01/18/2019   Diverticulosis 01/18/2019   Gait abnormality 12/27/2018   Numbness 12/27/2018   Status post total bilateral knee replacement 11/30/2018   DDD (degenerative disc disease), cervical 11/30/2018   Status post carpal tunnel release 11/30/2018   DDD (degenerative disc disease), lumbar 10/26/2018   Primary osteoarthritis of both hands 10/26/2018   Primary osteoarthritis of both feet 10/26/2018   Gastroesophageal reflux disease 08/27/2018   Bilateral impacted cerumen  08/21/2018   Sensorineural hearing loss (SNHL) of both ears 08/21/2018   Tinnitus, bilateral 08/21/2018   Blood in urine 04/17/2018   Secondary open-angle glaucoma of left eye, moderate stage 03/10/2018   Early stage 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 10/05/2017   Epiretinal membrane (ERM) of left eye 10/05/2017   Pseudophakia of both eyes 10/05/2017   Osteopenia 03/19/2017   Mixed hyperlipidemia 08/18/2016   Generalized osteoarthritis of multiple sites 08/18/2016   Peripheral neuropathic pain 08/18/2016   Overactive bladder 08/18/2016   Glaucoma 08/18/2016   Cervical disc disorder with radiculopathy of cervical region 08/17/2016   Carpal tunnel syndrome, right upper limb 08/17/2016   Abnormal finding on mammography 06/14/2016   Breast lump 05/06/2015    PCP: Donnajean Lopes, MD  REFERRING PROVIDER: Donnajean Lopes, MD   REFERRING DIAG: Unsteadiness on feet [R26.81]   THERAPY DIAG:  Muscle weakness (generalized)  Other abnormalities of gait and mobility  Unsteadiness on feet  Rationale for Evaluation and Treatment: Rehabilitation  ONSET DATE: 2 years ago  SUBJECTIVE:   SUBJECTIVE STATEMENT: Pt denies any adverse effects after prior Rx.  Pt states she hasn't been performing her HEP as much lately due to not feeling well.  Pt denies any functional improvements.  She has difficulty performing the steps to enter daughter's home.  Pt has difficulty with transfers and reports no change with sit/stands.  PERTINENT HISTORY: Arthritis, Osteopenia, Peripheral neuropathy, and bilat TKR.  PAIN:  Are you having pain? No  PRECAUTIONS: None  WEIGHT BEARING RESTRICTIONS: No  FALLS:  Has patient fallen in last 6 months? Yes. Number of falls 2  LIVING ENVIRONMENT: Lives with: lives alone Lives in: House/apartment Stairs: No Has following equipment at home: Single point cane, shower chair, and Grab bars  OCCUPATION: Retired  PLOF:  Independent  PATIENT GOALS: Pt would like to improve her strength in bilat LE's and improve her balance.   NEXT MD VISIT:   OBJECTIVE:   DIAGNOSTIC FINDINGS: None at this time.     TODAY'S TREATMENT:  Therapeutic Exercise: Pt performed: Ankle DF with YTB 3x10 bilat Sit to stands from chair from elevated table 3 x 10 without UE support  Seated toe raises 2x10 LAQ with 4# 3x10 Standing heel raises 2x10  Neuromuscular Re-education: -Reviewed current function, response to prior Rx, and pain level.  Marching on airex 2x10 reps with UE support Sidestepping with and without UE's on rail with SBA x 3 laps Grapevines with bilat UE support on rail with CGA x 2 laps Stepping over 3 hurdles with UE support on rail and CGA Sidestepping over 3 hurdles with UE support on rail and CGA    PATIENT EDUCATION:  Education details: PT encouraged pt to perform HEP.  Instructed pt to perform sit to stands at home with something in front of her for support as needed and with appropriate height.  Instructed pt to perform sidestepping at home with UE support on a stable surface. Exercise form and rationale. Person educated: Patient Education method: Customer service manager, verbal cues Education comprehension: verbalized understanding and returned demonstration, verbal cues required  HOME EXERCISE PROGRAM: Access Code: [email or web address] URL: [email or web address] Date: 11/10/2022 Prepared by: Ronny Flurry  Exercises - Seated March  - 1 x daily - 5-7 x weekly - 2 sets - 10 reps - Seated Toe Raise  - 1 x daily - 7 x weekly - 2 sets - 10 reps - Seated Heel Raise  - 1 x daily - 5-7 x weekly - 2 sets - 10 reps - Long Sitting Ankle Dorsiflexion with Anchored Resistance  - 1 x daily - 3-4 x weekly - 2 sets - 10 reps - Seated Hip Abduction with Resistance  - 1 x  daily - 3 x weekly - 2 sets - 10 reps - Romberg Stance  - 1 x daily - 5-7 x weekly - 2-3 reps - 30 seconds hold  ASSESSMENT:  CLINICAL IMPRESSION:  Pt denies any functional improvements and continues to have difficulty with transfers and stairs.  She has also not been performing HEP due to not feeling well.  Pt demonstrated improved performance of sit to stands and was able to perform without UE support from the elevated table.  Pt continues to have weakness with bilat DF.  Pt performed exercises well with cuing and instruction for correct form.  Pt demonstrates improved form with sidestepping and had some difficulty with grapevines.  Pt responded well to Rx having no c/o's after Rx.  She should benefit from continued skilled PT to address ongoing goals and improve balance, strength, and function.     OBJECTIVE IMPAIRMENTS: decreased activity tolerance, difficulty walking, decreased balance, decreased endurance, decreased mobility, decreased ROM, decreased strength, impaired flexibility, impaired UE/LE use, postural dysfunction, and pain.  ACTIVITY LIMITATIONS: bending, lifting, carry, locomotion, cleaning, community activity, driving, and or occupation  PERSONAL FACTORS: Arthritis, Osteopenia, Peripheral neuropathy.  are also affecting patient's functional outcome.  REHAB POTENTIAL: Good  CLINICAL DECISION MAKING: Stable/uncomplicated  EVALUATION COMPLEXITY: Low   GOALS: Short term PT Goals Target date: 01/06/2023 Pt will be I and compliant with HEP. Baseline:  Goal status: New  Long term PT goals Target date: 02/03/2023 Pt will improve BERG by at least 3 points in order to demonstrate clinically significant improvement in balance.   Baseline:  Goal status: New Pt will improve hip/knee strength to at least 5-/5 MMT to improve functional strength Baseline: Goal status: New Pt will report <1 fall during her duration in PT.         Baseline  Goal status: New  Pt will be able  to ambulate community distances at least 1000 ft WNL gait pattern without complaints Baseline: Goal status: New       5. Pt will be able to negotiate stairs with a reciprocal gait pattern       Baseline:        Goal status: New       6. Pt will improve her STS by 2.3 seconds for Minimal clinically important difference.         Baseline:          Goal status: New   PLAN: PT FREQUENCY: 1-2 times per week   PT DURATION: 6-8 weeks  PLANNED INTERVENTIONS (unless contraindicated): aquatic PT, Canalith repositioning, cryotherapy, Electrical stimulation, Iontophoresis with 4 mg/ml dexamethasome, Moist heat, traction, Ultrasound, gait training, Therapeutic exercise, balance training, neuromuscular re-education, patient/family education, prosthetic training, manual techniques, passive ROM, dry needling, taping, vasopnuematic device, vestibular, spinal manipulations, joint manipulations  PLAN FOR NEXT SESSION:  Cont with LE strengthening, balance, stair negotiation and gait.   Selinda Michaels III PT, DPT 12/09/22 3:45 PM

## 2022-12-13 ENCOUNTER — Ambulatory Visit (HOSPITAL_BASED_OUTPATIENT_CLINIC_OR_DEPARTMENT_OTHER): Payer: Medicare Other | Admitting: Physical Therapy

## 2022-12-13 DIAGNOSIS — R2681 Unsteadiness on feet: Secondary | ICD-10-CM

## 2022-12-13 DIAGNOSIS — M6281 Muscle weakness (generalized): Secondary | ICD-10-CM | POA: Diagnosis not present

## 2022-12-13 DIAGNOSIS — R2689 Other abnormalities of gait and mobility: Secondary | ICD-10-CM

## 2022-12-13 NOTE — Therapy (Signed)
OUTPATIENT PHYSICAL THERAPY LOWER EXTREMITY TREATMENT   Patient Name: Hailey Perkins MRN: [id number] DOB:[birth year], 88 y.o., female Today's Date: 12/09/2022  END OF SESSION:  PT End of Session - 12/09/22 1325     Visit Number 6    Number of Visits 8    Date for PT Re-Evaluation 01/03/23    Authorization Type [employer] MEDICARE    PT Start Time 1322    PT Stop Time 1401    PT Time Calculation (min) 39 min    Activity Tolerance Patient tolerated treatment well    Behavior During Therapy WFL for tasks assessed/performed               Past Medical History:  Diagnosis Date   Aortic atherosclerosis ([place])    Arthritis    Cataract 2010   bilateral eyes   Chicken pox    Cholelithiasis    Diverticulitis    Diverticulosis    GERD (gastroesophageal reflux disease)    Glaucoma    Hiatal hernia    History of frequent urinary tract infections    Hyperlipidemia    Osteopenia    Peripheral neuropathy    Sleep apnea    wears a CPAP   Urine incontinence    Past Surgical History:  Procedure Laterality Date   ABDOMINAL HYSTERECTOMY  1977   ANAL FISTULECTOMY  1985   ANKLE FRACTURE SURGERY Right 2014   APPENDECTOMY     BIOPSY  02/15/2021   Procedure: BIOPSY;  Surgeon: Irving Copas., MD;  Location: [facility];  Service: Gastroenterology;;   BREAST BIOPSY Bilateral 10+ yrs ago   BENIGN   BUNIONECTOMY Left 1975   Left Toe   CATARACT EXTRACTION, BILATERAL  2009   COLONOSCOPY WITH PROPOFOL N/A 02/15/2021   Procedure: COLONOSCOPY WITH PROPOFOL;  Surgeon: Irving Copas., MD;  Location: [facility];  Service: Gastroenterology;  Laterality: N/A;  ultraslimscope    DILATION AND CURETTAGE OF UTERUS  1972   eye [place] vision correction Left 10/09/2013   eye socket re-formed Left 2014   FOOT NEUROMA SURGERY Right 2003   plus bunionectomy, right great toe   HAMMER TOE SURGERY Right 2005   Right foot correction hammertoe little toe   HEMORRHOID SURGERY   1967   HIP SURGERY Right 2014   right hip femur pin implanted   KNEE ARTHROSCOPY Left 2010   debridement left knee scar tissue   LAPAROSCOPIC OOPHERECTOMY     1986, [place] Right 2003   Right Foot Neuroma, plus Bunionectomy, Right Great Toe   POLYPECTOMY  02/15/2021   Procedure: POLYPECTOMY;  Surgeon: Irving Copas., MD;  Location: [facility];  Service: Gastroenterology;;   [place] Left 01/2008   REPLACEMENT TOTAL KNEE Right 08/2008   retinal pucker correction Left 07/14/2012   schwannoma tumor (benign) removal  2011   right rib   [place]   Patient Active Problem List   Diagnosis Date Noted   Chronic pain of right knee 05/27/2021   OSA on CPAP 05/27/2021   LLQ pain 12/16/2020   History of diverticulitis 12/16/2020   Hematochezia 12/16/2020   Abnormal colonoscopy 12/16/2020   Diverticulitis of colon 08/15/2020   Sleep-related hypoxia 08/05/2019   OSA (obstructive sleep apnea) 08/05/2019   PLMD (periodic limb movement disorder) 08/05/2019   Cerebellar ataxia  in diseases classified elsewhere (HCC) 05/06/2019   Excessive daytime sleepiness 05/06/2019   Loud snoring 05/06/2019   Ventral hernia without obstruction or gangrene 02/22/2019   Chronic throat clearing 01/18/2019   Nocturnal cough 01/18/2019   Family history of colon cancer in father 01/18/2019   Diverticulosis 01/18/2019   Gait abnormality 12/27/2018   Numbness 12/27/2018   Status post total bilateral knee replacement 11/30/2018   DDD (degenerative disc disease), cervical 11/30/2018   Status post carpal tunnel release 11/30/2018   DDD (degenerative disc disease), lumbar 10/26/2018   Primary osteoarthritis of both hands 10/26/2018   Primary osteoarthritis of both feet 10/26/2018   Gastroesophageal reflux disease 08/27/2018   Bilateral impacted cerumen  08/21/2018   Sensorineural hearing loss (SNHL) of both ears 08/21/2018   Tinnitus, bilateral 08/21/2018   Blood in urine 04/17/2018   Secondary open-angle glaucoma of left eye, moderate stage 03/10/2018   Early stage 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 10/05/2017   Epiretinal membrane (ERM) of left eye 10/05/2017   Pseudophakia of both eyes 10/05/2017   Osteopenia 03/19/2017   Mixed hyperlipidemia 08/18/2016   Generalized osteoarthritis of multiple sites 08/18/2016   Peripheral neuropathic pain 08/18/2016   Overactive bladder 08/18/2016   Glaucoma 08/18/2016   Cervical disc disorder with radiculopathy of cervical region 08/17/2016   Carpal tunnel syndrome, right upper limb 08/17/2016   Abnormal finding on mammography 06/14/2016   Breast lump 05/06/2015    PCP: Garlan Fillers, MD  REFERRING PROVIDER: Garlan Fillers, MD   REFERRING DIAG: Unsteadiness on feet [R26.81]   THERAPY DIAG:  Muscle weakness (generalized)  Other abnormalities of gait and mobility  Unsteadiness on feet  Rationale for Evaluation and Treatment: Rehabilitation  ONSET DATE: 2 years ago  SUBJECTIVE:   SUBJECTIVE STATEMENT: Pt denies any adverse effects after prior Rx.  Pt reports compliance with HEP.  Pt states she has been performing LAQ and sidestepping at home.  Pt states she did an aquatic class at the [facility] yesterday.  She was tired and had to take a nap.  Pt states she stood up from a chair at church and almost fell but caught herself.  Pt states she is able to get out of her desk chair easier.   PERTINENT HISTORY: Arthritis, Osteopenia, Peripheral neuropathy, and bilat TKR.  PAIN:  Are you having pain? No  PRECAUTIONS: None  WEIGHT BEARING RESTRICTIONS: No  FALLS:  Has patient fallen in last 6 months? Yes. Number of falls 2  LIVING ENVIRONMENT: Lives with: lives alone Lives in: House/apartment Stairs: No Has following equipment at home: Single point cane,  shower chair, and Grab bars  OCCUPATION: Retired  PLOF: Independent  PATIENT GOALS: Pt would like to improve her strength in bilat LE's and improve her balance.   NEXT MD VISIT:   OBJECTIVE:   DIAGNOSTIC FINDINGS: None at this time.     TODAY'S TREATMENT:  Therapeutic Exercise: Pt performed: Nustep at L3 x 5 mins UE/LEs Ankle DF with YTB 3x10 bilat Sit to stands from chair from elevated table 3 x 10 without UE support  Seated toe raises 2x10 LAQ with 4# 3x10 Step ups on 4 inch step with UE on rail x 10 bilat   PT updated HEP.  Pt received a HEP handout and was educated in correct form and appropriate frequency.   Neuromuscular Re-education: -Reviewed current function, response to prior Rx, and pain level.  Marching on airex 2x10 reps with UE support Sidestepping with and without UE's on rail with SBA x 2 laps Grapevines with bilat UE support on rail with CGA x 3 laps Standing on airex with FA and FT 2x30 sec each with CGA/min assist    PATIENT EDUCATION:  Education details: PT encouraged pt to perform HEP.  Instructed pt to perform sit to stands at home with something in front of her for support as needed and with appropriate height.  Instructed pt to perform sidestepping at home with UE support on a stable surface. Exercise form and rationale. Person educated: Patient Education method: Medical illustrator, verbal cues Education comprehension: verbalized understanding and returned demonstration, verbal cues required  HOME EXERCISE PROGRAM: Access Code: [email or web address] URL: [email or web address] Date: 11/10/2022 Prepared by: Aaron Edelman  Exercises - Seated March  - 1 x daily - 5-7 x weekly - 2 sets - 10 reps - Seated Toe Raise  - 1 x daily - 7 x weekly - 2 sets - 10 reps - Seated Heel Raise  - 1 x daily - 5-7 x weekly -  2 sets - 10 reps - Long Sitting Ankle Dorsiflexion with Anchored Resistance  - 1 x daily - 3-4 x weekly - 2 sets - 10 reps - Seated Hip Abduction with Resistance  - 1 x daily - 3 x weekly - 2 sets - 10 reps - Romberg Stance  - 1 x daily - 5-7 x weekly - 2-3 reps - 30 seconds hold  Updated HEP: - Sit to Stand  - 1 x daily - 7 x weekly - 2 sets - 10 reps - Side Stepping with Counter Support  - 1 x daily - 5-6 x weekly - 2 sets - 10 reps - Seated Long Arc Quad with Ankle Weight  - 1 x daily - 3 x weekly - 3 sets - 10 reps  ASSESSMENT:  CLINICAL IMPRESSION:  Pt denies any functional improvements and continues to have difficulty with transfers and stairs.  She has also not been performing HEP due to not feeling well.  Pt demonstrated improved performance of sit to stands and was able to perform without UE support from the elevated table.  Pt continues to have weakness with bilat DF.  Pt performed exercises well with cuing and instruction for correct form.  Pt demonstrates improved form with sidestepping and had some difficulty with grapevines.  Pt responded well to Rx having no c/o's after Rx.  She should benefit from continued skilled PT to address ongoing goals and improve balance, strength, and function.   Getting out of desk chair easier    OBJECTIVE IMPAIRMENTS: decreased activity tolerance, difficulty walking, decreased balance, decreased endurance, decreased mobility, decreased ROM, decreased strength, impaired flexibility, impaired UE/LE use, postural dysfunction, and pain.  ACTIVITY LIMITATIONS: bending, lifting, carry, locomotion, cleaning, community activity, driving, and or occupation  PERSONAL FACTORS: Arthritis, Osteopenia, Peripheral neuropathy.  are also affecting patient's functional outcome.  REHAB POTENTIAL: Good  CLINICAL DECISION MAKING: Stable/uncomplicated  EVALUATION COMPLEXITY: Low   GOALS: Short term PT Goals Target date: 01/06/2023 Pt will be I and compliant with  HEP. Baseline:  Goal status: New  Long term PT goals Target date: 02/03/2023 Pt will improve BERG by at least 3 points in order to demonstrate clinically significant improvement in balance.   Baseline:  Goal status: New Pt will improve hip/knee strength to at least 5-/5 MMT to improve functional strength Baseline: Goal status: New Pt will report <1 fall during her duration in PT.         Baseline         Goal status: New  Pt will be able to ambulate community distances at least 1000 ft WNL gait pattern without complaints Baseline: Goal status: New       5. Pt will be able to negotiate stairs with a reciprocal gait pattern       Baseline:        Goal status: New       6. Pt will improve her STS by 2.3 seconds for Minimal clinically important difference.         Baseline:          Goal status: New   PLAN: PT FREQUENCY: 1-2 times per week   PT DURATION: 6-8 weeks  PLANNED INTERVENTIONS (unless contraindicated): aquatic PT, Canalith repositioning, cryotherapy, Electrical stimulation, Iontophoresis with 4 mg/ml dexamethasome, Moist heat, traction, Ultrasound, gait training, Therapeutic exercise, balance training, neuromuscular re-education, patient/family education, prosthetic training, manual techniques, passive ROM, dry needling, taping, vasopnuematic device, vestibular, spinal manipulations, joint manipulations  PLAN FOR NEXT SESSION:  Cont with LE strengthening, balance, stair negotiation and gait.   Selinda Michaels III PT, DPT 12/09/22 3:45 PM

## 2022-12-14 ENCOUNTER — Encounter (HOSPITAL_BASED_OUTPATIENT_CLINIC_OR_DEPARTMENT_OTHER): Payer: Self-pay | Admitting: Physical Therapy

## 2022-12-21 ENCOUNTER — Ambulatory Visit (HOSPITAL_BASED_OUTPATIENT_CLINIC_OR_DEPARTMENT_OTHER): Payer: Medicare Other | Admitting: Physical Therapy

## 2022-12-21 DIAGNOSIS — M6281 Muscle weakness (generalized): Secondary | ICD-10-CM

## 2022-12-21 DIAGNOSIS — R2689 Other abnormalities of gait and mobility: Secondary | ICD-10-CM

## 2022-12-21 DIAGNOSIS — R2681 Unsteadiness on feet: Secondary | ICD-10-CM

## 2022-12-21 NOTE — Therapy (Signed)
OUTPATIENT PHYSICAL THERAPY LOWER EXTREMITY TREATMENT   Patient Name: Hailey Perkins MRN: [id number] DOB:[birth year], 88 y.o., female Today's Date: 12/22/2022  END OF SESSION:   PT End of Session - 12/21/22        Visit Number 8    Number of Visits 10     Date for PT Re-Evaluation 01/03/23     Authorization Type [employer] MEDICARE     PT Start Time 1446     PT Stop Time 2841    PT Time Calculation (min) 44 min     Activity Tolerance Patient tolerated treatment well     Behavior During Therapy WFL for tasks assessed/performed        Past Medical History:  Diagnosis Date   Aortic atherosclerosis ([place])    Arthritis    Cataract 2010   bilateral eyes   Chicken pox    Cholelithiasis    Diverticulitis    Diverticulosis    GERD (gastroesophageal reflux disease)    Glaucoma    Hiatal hernia    History of frequent urinary tract infections    Hyperlipidemia    Osteopenia    Peripheral neuropathy    Sleep apnea    wears a CPAP   Urine incontinence    Past Surgical History:  Procedure Laterality Date   ABDOMINAL HYSTERECTOMY  1977   ANAL FISTULECTOMY  1985   ANKLE FRACTURE SURGERY Right 2014   APPENDECTOMY     BIOPSY  02/15/2021   Procedure: BIOPSY;  Surgeon: Irving Copas., MD;  Location: [facility];  Service: Gastroenterology;;   BREAST BIOPSY Bilateral 10+ yrs ago   BENIGN   BUNIONECTOMY Left 1975   Left Toe   CATARACT EXTRACTION, BILATERAL  2009   COLONOSCOPY WITH PROPOFOL N/A 02/15/2021   Procedure: COLONOSCOPY WITH PROPOFOL;  Surgeon: Irving Copas., MD;  Location: [facility];  Service: Gastroenterology;  Laterality: N/A;  ultraslimscope    DILATION AND CURETTAGE OF UTERUS  1972   eye [place] vision correction Left 10/09/2013   eye socket re-formed Left 2014   FOOT NEUROMA SURGERY Right 2003   plus bunionectomy, right great toe   HAMMER TOE SURGERY Right 2005   Right foot correction hammertoe little toe   HEMORRHOID SURGERY  1967    HIP SURGERY Right 2014   right hip femur pin implanted   KNEE ARTHROSCOPY Left 2010   debridement left knee scar tissue   LAPAROSCOPIC OOPHERECTOMY     1986, [place] Right 2003   Right Foot Neuroma, plus Bunionectomy, Right Great Toe   POLYPECTOMY  02/15/2021   Procedure: POLYPECTOMY;  Surgeon: Irving Copas., MD;  Location: [facility];  Service: Gastroenterology;;   [place] Left 01/2008   REPLACEMENT TOTAL KNEE Right 08/2008   retinal pucker correction Left 07/14/2012   schwannoma tumor (benign) removal  2011   right rib   [place]   Patient Active Problem List   Diagnosis Date Noted   Chronic pain of right knee 05/27/2021   OSA on CPAP 05/27/2021   LLQ pain 12/16/2020   History of diverticulitis 12/16/2020   Hematochezia 12/16/2020   Abnormal colonoscopy 12/16/2020   Diverticulitis of colon 08/15/2020   Sleep-related hypoxia 08/05/2019   OSA (obstructive sleep apnea) 08/05/2019   PLMD (periodic limb movement disorder) 08/05/2019  Cerebellar 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) 05/06/2019   Excessive daytime sleepiness 05/06/2019   Loud snoring 05/06/2019   Ventral hernia without obstruction or gangrene 02/22/2019   Chronic throat clearing 01/18/2019   Nocturnal cough 01/18/2019   Family history of colon cancer in father 01/18/2019   Diverticulosis 01/18/2019   Gait abnormality 12/27/2018   Numbness 12/27/2018   Status post total bilateral knee replacement 11/30/2018   DDD (degenerative disc disease), cervical 11/30/2018   Status post carpal tunnel release 11/30/2018   DDD (degenerative disc disease), lumbar 10/26/2018   Primary osteoarthritis of both hands 10/26/2018   Primary osteoarthritis of both feet 10/26/2018   Gastroesophageal reflux disease 08/27/2018   Bilateral impacted cerumen 08/21/2018    Sensorineural hearing loss (SNHL) of both ears 08/21/2018   Tinnitus, bilateral 08/21/2018   Blood in urine 04/17/2018   Secondary open-angle glaucoma of left eye, moderate stage 03/10/2018   Early stage 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 10/05/2017   Epiretinal membrane (ERM) of left eye 10/05/2017   Pseudophakia of both eyes 10/05/2017   Osteopenia 03/19/2017   Mixed hyperlipidemia 08/18/2016   Generalized osteoarthritis of multiple sites 08/18/2016   Peripheral neuropathic pain 08/18/2016   Overactive bladder 08/18/2016   Glaucoma 08/18/2016   Cervical disc disorder with radiculopathy of cervical region 08/17/2016   Carpal tunnel syndrome, right upper limb 08/17/2016   Abnormal finding on mammography 06/14/2016   Breast lump 05/06/2015    PCP: Garlan Fillers, MD  REFERRING PROVIDER: Garlan Fillers, MD   REFERRING DIAG: Unsteadiness on feet [R26.81]   THERAPY DIAG:  Muscle weakness (generalized)  Other abnormalities of gait and mobility  Unsteadiness on feet  Rationale for Evaluation and Treatment: Rehabilitation  ONSET DATE: 2 years ago  SUBJECTIVE:   SUBJECTIVE STATEMENT: Pt denies any adverse effects after prior Rx.  Pt reports compliance with HEP.   Pt states she did an aquatic class at the [facility] this AM.  She was tired and had to take a nap.  Pt is unsure if she has had any functional improvements.    PERTINENT HISTORY: Arthritis, Osteopenia, Peripheral neuropathy, and bilat TKR. PAIN:  Are you having pain? No  PRECAUTIONS: None  WEIGHT BEARING RESTRICTIONS: No  FALLS:  Has patient fallen in last 6 months? Yes. Number of falls 2  LIVING ENVIRONMENT: Lives with: lives alone Lives in: House/apartment Stairs: No Has following equipment at home: Single point cane, shower chair, and Grab bars  OCCUPATION: Retired  PLOF: Independent  PATIENT GOALS: Pt would like to improve her strength in bilat LE's and improve her  balance.   NEXT MD VISIT:   OBJECTIVE:   DIAGNOSTIC FINDINGS: None at this time.     TODAY'S TREATMENT:                                                                                                                                PATIENT SURVEYS:  ABC scale:  Initial/Current:  50.6 % / 66.25%  5 times sit to stand test:  Initial: 18.2, pt requires cues for standing up tall, and utilizes hands. Current:  19 sec without UE support / 14 sec with UE support   Therapeutic Exercise: -Reviewed current function, response to prior Rx, and pain level. Pt performed: Nustep at L3 x 5 mins UE/LEs Ankle DF with RTB 3x10 bilat Seated toe raises 2x10 LAQ with 4# 3x10 Step ups on 4 inch step with UE on rail 2 x 10 bilat   Neuromuscular Re-education: Marching on airex 2x10 reps with UE support Sidestepping without UE's on rail with SBA x 3 laps Grapevines with bilat UE support on rail with CGA x 3 laps Standing on airex with FA and FT 2x30 sec each with CGA/min assist Step to and reciprocal gait over 4 hurdles with CGA with UE support    PATIENT EDUCATION:  Education details:  5x STS test and ABC scale findings and progress. Exercise form and rationale. Person educated: Patient Education method: Customer service manager, verbal cues Education comprehension: verbalized understanding and returned demonstration, verbal cues required  HOME EXERCISE PROGRAM: Access Code: [email or web address] URL: [email or web address] Date: 11/10/2022 Prepared by: Ronny Flurry  Exercises - Seated March  - 1 x daily - 5-7 x weekly - 2 sets - 10 reps - Seated Toe Raise  - 1 x daily - 7 x weekly - 2 sets - 10 reps - Seated Heel Raise  - 1 x daily - 5-7 x weekly - 2 sets - 10 reps - Long Sitting Ankle Dorsiflexion with Anchored Resistance  - 1 x daily - 3-4 x weekly - 2 sets - 10 reps - Seated Hip Abduction with Resistance  - 1 x daily - 3 x weekly - 2 sets - 10 reps - Romberg Stance  - 1 x daily  - 5-7 x weekly - 2-3 reps - 30 seconds hold   - Sit to Stand  - 1 x daily - 7 x weekly - 2 sets - 10 reps - Side Stepping with Counter Support  - 1 x daily - 5-6 x weekly - 2 sets - 10 reps - Seated Long Arc Quad with Ankle Weight  - 1 x daily - 3 x weekly - 3 sets - 10 reps  ASSESSMENT:  CLINICAL IMPRESSION:  Pt unsure if she has had any functional improvement.  Pt has improved with sit to stand transfers as evidenced by performance in the clinic and also the 5x STS test.  She had a 4 second improvement on 5x STS test compared to the initial eval with using UE support.  Pt demonstrates improved self perceived disability with balance as evidenced by improved ABC scale score.  Pt met STG #1 and LTG #6. Pt continues to have weakness with bilat DF.  Pt has proprioceptive deficits though is improving with balance exercises.  She responded well to Rx having no c/o's after Rx.  She should benefit from continued skilled PT to address ongoing goals and improve balance, strength, and function.    OBJECTIVE IMPAIRMENTS: decreased activity tolerance, difficulty walking, decreased balance, decreased endurance, decreased mobility, decreased ROM, decreased strength, impaired flexibility, impaired UE/LE use, postural dysfunction, and pain.  ACTIVITY LIMITATIONS: bending, lifting, carry, locomotion, cleaning, community activity, driving, and or occupation  PERSONAL FACTORS: Arthritis, Osteopenia, Peripheral neuropathy.  are also affecting patient's functional outcome.  REHAB POTENTIAL: Good  CLINICAL DECISION MAKING: Stable/uncomplicated  EVALUATION COMPLEXITY: Low   GOALS: Short  term PT Goals Target date:11/29/2022 Pt will be I and compliant with HEP. Baseline:  Goal status: MET  Long term PT goals Target date: 12/27/2022 Pt will improve BERG by at least 3 points in order to demonstrate clinically significant improvement in balance.   Baseline:  Goal status: New Pt will improve hip/knee strength  to at least 5-/5 MMT to improve functional strength Baseline: Goal status: New Pt will report <1 fall during her duration in PT.         Baseline         Goal status: New  Pt will be able to ambulate community distances at least 1000 ft WNL gait pattern without complaints Baseline: Goal status: New       5. Pt will be able to negotiate stairs with a reciprocal gait pattern       Baseline:        Goal status: New       6. Pt will improve her STS by 2.3 seconds for Minimal clinically important difference.         Baseline:          Goal status: GOAL MET   PLAN: PT FREQUENCY: 1-2 times per week   PT DURATION: 6-8 weeks  PLANNED INTERVENTIONS (unless contraindicated): aquatic PT, Canalith repositioning, cryotherapy, Electrical stimulation, Iontophoresis with 4 mg/ml dexamethasome, Moist heat, traction, Ultrasound, gait training, Therapeutic exercise, balance training, neuromuscular re-education, patient/family education, prosthetic training, manual techniques, passive ROM, dry needling, taping, vasopnuematic device, vestibular, spinal manipulations, joint manipulations  PLAN FOR NEXT SESSION:  Cont with LE strengthening, balance, stair negotiation and gait.   Selinda Michaels III PT, DPT 12/22/22 11:18 PM

## 2022-12-22 ENCOUNTER — Encounter (HOSPITAL_BASED_OUTPATIENT_CLINIC_OR_DEPARTMENT_OTHER): Payer: Self-pay | Admitting: Physical Therapy

## 2022-12-28 ENCOUNTER — Ambulatory Visit (HOSPITAL_BASED_OUTPATIENT_CLINIC_OR_DEPARTMENT_OTHER): Payer: Medicare Other | Admitting: Physical Therapy

## 2022-12-28 ENCOUNTER — Encounter (HOSPITAL_BASED_OUTPATIENT_CLINIC_OR_DEPARTMENT_OTHER): Payer: Self-pay | Admitting: Physical Therapy

## 2022-12-28 DIAGNOSIS — M6281 Muscle weakness (generalized): Secondary | ICD-10-CM

## 2022-12-28 DIAGNOSIS — R2681 Unsteadiness on feet: Secondary | ICD-10-CM

## 2022-12-28 DIAGNOSIS — R2689 Other abnormalities of gait and mobility: Secondary | ICD-10-CM

## 2022-12-28 NOTE — Therapy (Signed)
OUTPATIENT PHYSICAL THERAPY LOWER EXTREMITY TREATMENT   Patient Name: Hailey Perkins MRN: [id number] DOB:1935/03/26, 88 y.o., female Today's Date: 12/29/2022  END OF SESSION:  PT End of Session - 12/28/22 1112     Visit Number 9    Number of Visits 10    Date for PT Re-Evaluation 01/03/23    Authorization Type [employer]ICARE    PT Start Time 1107    PT Stop Time 1146    PT Time Calculation (min) 39 min    Activity Tolerance Patient tolerated treatment well    Behavior During Therapy WFL for tasks assessed/performed                   Past Medical History:  Diagnosis Date   Aortic atherosclerosis (HCC)    Arthritis    Cataract 2010   bilateral eyes   Chicken pox    Cholelithiasis    Diverticulitis    Diverticulosis    GERD (gastroesophageal reflux disease)    Glaucoma    Hiatal hernia    History of frequent urinary tract infections    Hyperlipidemia    Osteopenia    Peripheral neuropathy    Sleep apnea    wears a CPAP   Urine incontinence    Past Surgical History:  Procedure Laterality Date   ABDOMINAL HYSTERECTOMY  1977   ANAL FISTULECTOMY  1985   ANKLE FRACTURE SURGERY Right 2014   APPENDECTOMY     BIOPSY  02/15/2021   Procedure: BIOPSY;  Surgeon: Lemar Lofty., MD;  Location: [facility];  Service: Gastroenterology;;   BREAST BIOPSY Bilateral 10+ yrs ago   BENIGN   BUNIONECTOMY Left 1975   Left Toe   CATARACT EXTRACTION, BILATERAL  2009   COLONOSCOPY WITH PROPOFOL N/A 02/15/2021   Procedure: COLONOSCOPY WITH PROPOFOL;  Surgeon: Lemar Lofty., MD;  Location: [facility];  Service: Gastroenterology;  Laterality: N/A;  ultraslimscope    DILATION AND CURETTAGE OF UTERUS  1972   eye PRK vision correction Left 10/09/2013   eye socket re-formed Left 2014   FOOT NEUROMA SURGERY Right 2003   plus bunionectomy, right great toe   HAMMER TOE SURGERY Right 2005   Right foot correction hammertoe little toe   HEMORRHOID  SURGERY  1967   HIP SURGERY Right 2014   right hip femur pin implanted   KNEE ARTHROSCOPY Left 2010   debridement left knee scar tissue   LAPAROSCOPIC OOPHERECTOMY     1986, 1990   LASIK  1998   NEUROMA SURGERY Right 2003   Right Foot Neuroma, plus Bunionectomy, Right Great Toe   POLYPECTOMY  02/15/2021   Procedure: POLYPECTOMY;  Surgeon: Lemar Lofty., MD;  Location: [facility];  Service: Gastroenterology;;   RECTOCELE REPAIR  1968   REPLACEMENT TOTAL KNEE Left 01/2008   REPLACEMENT TOTAL KNEE Right 08/2008   retinal pucker correction Left 07/14/2012   schwannoma tumor (benign) removal  2011   right rib   SIGMOIDOSCOPY     TONSILLECTOMY AND ADENOIDECTOMY     VENTRAL HERNIA REPAIR  1993   Patient Active Problem List   Diagnosis Date Noted   Chronic pain of right knee 05/27/2021   OSA on CPAP 05/27/2021   LLQ pain 12/16/2020   History of diverticulitis 12/16/2020   Hematochezia 12/16/2020   Abnormal colonoscopy 12/16/2020   Diverticulitis of colon 08/15/2020   Sleep-related hypoxia 08/05/2019   OSA (obstructive sleep apnea) 08/05/2019   PLMD (periodic limb movement disorder) 08/05/2019  Cerebellar 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 ([place]) 05/06/2019   Excessive daytime sleepiness 05/06/2019   Loud snoring 05/06/2019   Ventral hernia without obstruction or gangrene 02/22/2019   Chronic throat clearing 01/18/2019   Nocturnal cough 01/18/2019   Family history of colon cancer in father 01/18/2019   Diverticulosis 01/18/2019   Gait abnormality 12/27/2018   Numbness 12/27/2018   Status post total bilateral knee replacement 11/30/2018   DDD (degenerative disc disease), cervical 11/30/2018   Status post carpal tunnel release 11/30/2018   DDD (degenerative disc disease), lumbar 10/26/2018   Primary osteoarthritis of both hands 10/26/2018   Primary osteoarthritis of both feet 10/26/2018   Gastroesophageal reflux disease 08/27/2018   Bilateral impacted cerumen  08/21/2018   Sensorineural hearing loss (SNHL) of both ears 08/21/2018   Tinnitus, bilateral 08/21/2018   Blood in urine 04/17/2018   Secondary open-angle glaucoma of left eye, moderate stage 03/10/2018   Early stage 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 10/05/2017   Epiretinal membrane (ERM) of left eye 10/05/2017   Pseudophakia of both eyes 10/05/2017   Osteopenia 03/19/2017   Mixed hyperlipidemia 08/18/2016   Generalized osteoarthritis of multiple sites 08/18/2016   Peripheral neuropathic pain 08/18/2016   Overactive bladder 08/18/2016   Glaucoma 08/18/2016   Cervical disc disorder with radiculopathy of cervical region 08/17/2016   Carpal tunnel syndrome, right upper limb 08/17/2016   Abnormal finding on mammography 06/14/2016   Breast lump 05/06/2015    PCP: Donnajean Lopes, MD  REFERRING PROVIDER: Donnajean Lopes, MD   REFERRING DIAG: Unsteadiness on feet [R26.81]   THERAPY DIAG:  Muscle weakness (generalized)  Other abnormalities of gait and mobility  Unsteadiness on feet  Rationale for Evaluation and Treatment: Rehabilitation  ONSET DATE: 2 years ago  SUBJECTIVE:   SUBJECTIVE STATEMENT: Pt denies any adverse effects after prior Rx.  Pt reports compliance with HEP, just unable to perform resisted DF.  Pt states she did an aquatic class at the [facility] this AM and usually feels good after the aquatic class. Pt is unsure if she has had any functional improvements.    PERTINENT HISTORY: Arthritis, Osteopenia, Peripheral neuropathy, and bilat TKR. PAIN:  Are you having pain? No  PRECAUTIONS: None  WEIGHT BEARING RESTRICTIONS: No  FALLS:  Has patient fallen in last 6 months? Yes. Number of falls 2  LIVING ENVIRONMENT: Lives with: lives alone Lives in: House/apartment Stairs: No Has following equipment at home: Single point cane, shower chair, and Grab bars  OCCUPATION: Retired  PLOF: Independent  PATIENT GOALS: Pt would like to  improve her strength in bilat LE's and improve her balance.   NEXT MD VISIT:   OBJECTIVE:   DIAGNOSTIC FINDINGS: None at this time.     TODAY'S TREATMENT:  PATIENT SURVEYS:   Therapeutic Exercise: -Reviewed current function, response to prior Rx, and pain level. Pt performed: Nustep at L3 x 5 mins UE/LEs Seated toe raises 2x10 Step ups on 4 inch step with UE on rail x 10 bilat and x 5 reps without UE support with CGA/min assist      On 6 inch step with UE support x 10 bilat   Neuromuscular Re-education: Marching on airex 2x10 reps with UE support Standing on airex with FA and FT 2x30 sec each with CGA/min assist Staggered stance 2x30 sec bilat without UE support with SBA/CGA Sidestepping without UE's on rail with SBA x 3 laps Grapevines with bilat UE support on rail and occasionally without UE assist with CGA/min Assist x 3 laps Step to and reciprocal gait over 4 hurdles with CGA with UE support    PATIENT EDUCATION:  Education details:  POC, exercise form and rationale. Person educated: Patient Education method: Customer service manager, verbal cues Education comprehension: verbalized understanding and returned demonstration, verbal cues required  HOME EXERCISE PROGRAM: Access Code: [email or web address] URL: [email or web address] Date: 11/10/2022 Prepared by: Ronny Flurry  Exercises - Seated March  - 1 x daily - 5-7 x weekly - 2 sets - 10 reps - Seated Toe Raise  - 1 x daily - 7 x weekly - 2 sets - 10 reps - Seated Heel Raise  - 1 x daily - 5-7 x weekly - 2 sets - 10 reps - Long Sitting Ankle Dorsiflexion with Anchored Resistance  - 1 x daily - 3-4 x weekly - 2 sets - 10 reps - Seated Hip Abduction with Resistance  - 1 x daily - 3 x weekly - 2 sets - 10 reps - Romberg Stance  - 1 x daily - 5-7 x weekly - 2-3 reps - 30 seconds  hold   - Sit to Stand  - 1 x daily - 7 x weekly - 2 sets - 10 reps - Side Stepping with Counter Support  - 1 x daily - 5-6 x weekly - 2 sets - 10 reps - Seated Long Arc Quad with Ankle Weight  - 1 x daily - 3 x weekly - 3 sets - 10 reps  ASSESSMENT:  CLINICAL IMPRESSION:  Pt unsure if she has had any functional improvement.  Pt is improving with tolerance to activity and balance as evidenced by performance and progression of exercises.  Pt demonstrates improved stability/balance with sidestepping and grapevines though continues to have difficulty and LOB with grapevines requiring assistance.  Pt continues to have decreased foot clearance bilat with gait.  Pt reports compliance with HEP though is unable to find a good place to perform resisted DF at home.  She responded well to Rx having no c/o's after Rx.  She should benefit from continued skilled PT to address ongoing goals and improve balance, strength, and function.    OBJECTIVE IMPAIRMENTS: decreased activity tolerance, difficulty walking, decreased balance, decreased endurance, decreased mobility, decreased ROM, decreased strength, impaired flexibility, impaired UE/LE use, postural dysfunction, and pain.  ACTIVITY LIMITATIONS: bending, lifting, carry, locomotion, cleaning, community activity, driving, and or occupation  PERSONAL FACTORS: Arthritis, Osteopenia, Peripheral neuropathy.  are also affecting patient's functional outcome.  REHAB POTENTIAL: Good  CLINICAL DECISION MAKING: Stable/uncomplicated  EVALUATION COMPLEXITY: Low   GOALS: Short term PT Goals Target date:11/29/2022 Pt will be I and compliant with HEP. Baseline:  Goal status: MET  Long term PT goals Target date: 12/27/2022 Pt will improve BERG by at least 3  points in order to demonstrate clinically significant improvement in balance.   Baseline:  Goal status: New Pt will improve hip/knee strength to at least 5-/5 MMT to improve functional  strength Baseline: Goal status: New Pt will report <1 fall during her duration in PT.         Baseline         Goal status: New  Pt will be able to ambulate community distances at least 1000 ft WNL gait pattern without complaints Baseline: Goal status: New       5. Pt will be able to negotiate stairs with a reciprocal gait pattern       Baseline:        Goal status: New       6. Pt will improve her STS by 2.3 seconds for Minimal clinically important difference.         Baseline:          Goal status: GOAL MET   PLAN: PT FREQUENCY: 1-2 times per week   PT DURATION: 6-8 weeks  PLANNED INTERVENTIONS (unless contraindicated): aquatic PT, Canalith repositioning, cryotherapy, Electrical stimulation, Iontophoresis with 4 mg/ml dexamethasome, Moist heat, traction, Ultrasound, gait training, Therapeutic exercise, balance training, neuromuscular re-education, patient/family education, prosthetic training, manual techniques, passive ROM, dry needling, taping, vasopnuematic device, vestibular, spinal manipulations, joint manipulations  PLAN FOR NEXT SESSION:  Cont with LE strengthening, balance, stair negotiation and gait.  PN next visit.  Berg Balance assessment next visit.   Audie Clear III PT, DPT 12/29/22 4:33 PM

## 2023-01-01 NOTE — Therapy (Signed)
OUTPATIENT PHYSICAL THERAPY LOWER EXTREMITY TREATMENT / PROGRESS NOTE  Progress Note Reporting Period 11/01/2022 to 01/02/2022  See note below for Objective Data and Assessment of Progress/Goals.       Patient Name: Hailey Perkins MRN: [id number] DOB:10-09-1935, 88 y.o., female Today's Date: 01/03/2023  END OF SESSION:  PT End of Session - 01/02/23 1529     Visit Number 10    Number of Visits 18    Date for PT Re-Evaluation 02/13/23    Authorization Type [employer] MEDICARE    PT Start Time 1527    PT Stop Time 1606    PT Time Calculation (min) 39 min    Activity Tolerance Patient tolerated treatment well    Behavior During Therapy WFL for tasks assessed/performed                    Past Medical History:  Diagnosis Date   Aortic atherosclerosis (HCC)    Arthritis    Cataract 2010   bilateral eyes   Chicken pox    Cholelithiasis    Diverticulitis    Diverticulosis    GERD (gastroesophageal reflux disease)    Glaucoma    Hiatal hernia    History of frequent urinary tract infections    Hyperlipidemia    Osteopenia    Peripheral neuropathy    Sleep apnea    wears a CPAP   Urine incontinence    Past Surgical History:  Procedure Laterality Date   ABDOMINAL HYSTERECTOMY  1977   ANAL FISTULECTOMY  1985   ANKLE FRACTURE SURGERY Right 2014   APPENDECTOMY     BIOPSY  02/15/2021   Procedure: BIOPSY;  Surgeon: Lemar Lofty., MD;  Location: [facility];  Service: Gastroenterology;;   BREAST BIOPSY Bilateral 10+ yrs ago   BENIGN   BUNIONECTOMY Left 1975   Left Toe   CATARACT EXTRACTION, BILATERAL  2009   COLONOSCOPY WITH PROPOFOL N/A 02/15/2021   Procedure: COLONOSCOPY WITH PROPOFOL;  Surgeon: Lemar Lofty., MD;  Location: [facility];  Service: Gastroenterology;  Laterality: N/A;  ultraslimscope    DILATION AND CURETTAGE OF UTERUS  1972   eye PRK vision correction Left 10/09/2013   eye socket re-formed Left 2014   FOOT  NEUROMA SURGERY Right 2003   plus bunionectomy, right great toe   HAMMER TOE SURGERY Right 2005   Right foot correction hammertoe little toe   HEMORRHOID SURGERY  1967   HIP SURGERY Right 2014   right hip femur pin implanted   KNEE ARTHROSCOPY Left 2010   debridement left knee scar tissue   LAPAROSCOPIC OOPHERECTOMY     1986, 1990   LASIK  1998   NEUROMA SURGERY Right 2003   Right Foot Neuroma, plus Bunionectomy, Right Great Toe   POLYPECTOMY  02/15/2021   Procedure: POLYPECTOMY;  Surgeon: Mansouraty, Netty Starring., MD;  Location: [facility];  Service: Gastroenterology;;   RECTOCELE REPAIR  1968   REPLACEMENT TOTAL KNEE Left 01/2008   REPLACEMENT TOTAL KNEE Right 08/2008   retinal pucker correction Left 07/14/2012   schwannoma tumor (benign) removal  2011   right rib   SIGMOIDOSCOPY     TONSILLECTOMY AND ADENOIDECTOMY     VENTRAL HERNIA REPAIR  1993   Patient Active Problem List   Diagnosis Date Noted   Chronic pain of right knee 05/27/2021   OSA on CPAP 05/27/2021   LLQ pain 12/16/2020   History of diverticulitis 12/16/2020   Hematochezia 12/16/2020   Abnormal colonoscopy  12/16/2020   Diverticulitis of colon 08/15/2020   Sleep-related hypoxia 08/05/2019   OSA (obstructive sleep apnea) 08/05/2019   PLMD (periodic limb movement disorder) 08/05/2019   Cerebellar 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 ([place]) 05/06/2019   Excessive daytime sleepiness 05/06/2019   Loud snoring 05/06/2019   Ventral hernia without obstruction or gangrene 02/22/2019   Chronic throat clearing 01/18/2019   Nocturnal cough 01/18/2019   Family history of colon cancer in father 01/18/2019   Diverticulosis 01/18/2019   Gait abnormality 12/27/2018   Numbness 12/27/2018   Status post total bilateral knee replacement 11/30/2018   DDD (degenerative disc disease), cervical 11/30/2018   Status post carpal tunnel release 11/30/2018   DDD (degenerative disc disease), lumbar 10/26/2018   Primary  osteoarthritis of both hands 10/26/2018   Primary osteoarthritis of both feet 10/26/2018   Gastroesophageal reflux disease 08/27/2018   Bilateral impacted cerumen 08/21/2018   Sensorineural hearing loss (SNHL) of both ears 08/21/2018   Tinnitus, bilateral 08/21/2018   Blood in urine 04/17/2018   Secondary open-angle glaucoma of left eye, moderate stage 03/10/2018   Early stage 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 10/05/2017   Epiretinal membrane (ERM) of left eye 10/05/2017   Pseudophakia of both eyes 10/05/2017   Osteopenia 03/19/2017   Mixed hyperlipidemia 08/18/2016   Generalized osteoarthritis of multiple sites 08/18/2016   Peripheral neuropathic pain 08/18/2016   Overactive bladder 08/18/2016   Glaucoma 08/18/2016   Cervical disc disorder with radiculopathy of cervical region 08/17/2016   Carpal tunnel syndrome, right upper limb 08/17/2016   Abnormal finding on mammography 06/14/2016   Breast lump 05/06/2015    PCP: Donnajean Lopes, MD  REFERRING PROVIDER: Donnajean Lopes, MD   REFERRING DIAG: Unsteadiness on feet [R26.81]   THERAPY DIAG:  Muscle weakness (generalized)  Other abnormalities of gait and mobility  Unsteadiness on feet  Rationale for Evaluation and Treatment: Rehabilitation  ONSET DATE: 2 years ago  SUBJECTIVE:   SUBJECTIVE STATEMENT: Pt denies any adverse effects after prior Rx.  Pt reports compliance with HEP, just unable to perform resisted DF.  Pt att[facility] her aquatic class at [facility] 3x this week.  Pt thinks she could walk more without the cane and has walked more at home without cane.  Pt didn't bring her cane in today.  Pt didn't hear as many "foot flaps" yesterday though hears them today.  Pt continues to have difficulty with stairs.  She reports no change with performing her daughters stairs at home.  Pt denies having any falls since starting PT.     PERTINENT HISTORY: Arthritis, Osteopenia, Peripheral neuropathy,  and bilat TKR. PAIN:  Are you having pain? No  PRECAUTIONS: None  WEIGHT BEARING RESTRICTIONS: No  FALLS:  Has patient fallen in last 6 months? Yes. Number of falls 2  LIVING ENVIRONMENT: Lives with: lives alone Lives in: House/apartment Stairs: No Has following equipment at home: Single point cane, shower chair, and Grab bars  OCCUPATION: Retired  PLOF: Independent  PATIENT GOALS: Pt would like to improve her strength in bilat LE's and improve her balance.   NEXT MD VISIT:   OBJECTIVE:   DIAGNOSTIC FINDINGS: None at this time.     TODAY'S TREATMENT:  12/21/2022: Prior Rx: PATIENT SURVEYS:  ABC scale:  Initial/Current:  50.6 % / 66.25%   5 times sit to stand test:  Initial: 18.2, pt requires cues for standing up tall, and utilizes hands. Current:  19 sec without UE support / 14 sec with UE support    Today's Treatment:  LOWER EXTREMITY MMT:   MMT Right eval Left eval Right 1/29 Left 1/29  Hip flexion 4- 4- 4-/5 4/5  Knee flexion 4+ 4+ 4+/5 4+/5  Knee extension 5 5 5/5 5/5  Ankle dorsiflexion 4- 4- 4-/5 4-/5  Ankle plantarflexion         (Blank rows = not tested)    BERG BALANCE TEST Sitting to Standing: 3.      Stands independently using hands Standing Unsupported: 4.      Stands safely for 2 minutes Sitting Unsupported: 4.     Sits for 2 minutes independently Standing to Sitting: 4.      sits safely with minimal use of hands Transfers: 3.     Transfers safely definite use of hands Standing with eyes closed: 4.     Stands safely for 10 seconds  Standing with feet together: 4.     Stands for 1 minute safely Reaching forward with outstretched arm: 4.     Reaches forward 10 inches Retrieving object from the floor: 3.     Able to pick up with supervision Turning to look behind: 4.    looks behind from both sides and weight shifts  well Turning 360 degrees: 2.     Able to turn slowly, but safely Place alternate foot on stool: 2.     4 steps without assistance/supervision Standing with one foot in front: 0.     Loses balance while standing/stepping Standing on one foot: 0.     Unable  Total Score:  41/56   Gait:  Pt ambulated without SPC and did have some unsteadiness.  She used the Sokolow at times for UE support and had no LOB.  Pt has decreased foot clearance bilat, slow gait, and decreased step length bilat.  Pt had improved steadiness and balance with SPC.  She continues to have decreased foot clearance though did have improved step length.       -Reviewed current function, HEP compliance, response to prior Rx, and pain level.  Pt performed: Neuromuscular Re-education: Marching on airex 2x10 reps with UE support Sidestepping without UE's on rail with SBA x 2 laps Grapevines with bilat UE support on rail and occasionally without UE assist with CGA/min Assist x 2 laps    PATIENT EDUCATION:  Education details:  PT instructed pt to use her cane with ambulation due to her gait.  Objective findings, POC, exercise form and rationale. Person educated: Patient Education method: Medical illustrator, verbal cues Education comprehension: verbalized understanding and returned demonstration, verbal cues required  HOME EXERCISE PROGRAM: Access Code: [email or web address] URL: [email or web address] Date: 11/10/2022 Prepared by: Aaron Edelman  Exercises - Seated March  - 1 x daily - 5-7 x weekly - 2 sets - 10 reps - Seated Toe Raise  - 1 x daily - 7 x weekly - 2 sets - 10 reps - Seated Heel Raise  - 1 x daily - 5-7 x weekly - 2 sets - 10 reps - Long Sitting Ankle Dorsiflexion with Anchored Resistance  - 1 x daily - 3-4 x weekly - 2 sets - 10 reps - Seated Hip Abduction with Resistance  - 1 x daily -  3 x weekly - 2 sets - 10 reps - Romberg Stance  - 1 x daily - 5-7 x weekly - 2-3 reps - 30 seconds  hold   - Sit to Stand  - 1 x daily - 7 x weekly - 2 sets - 10 reps - Side Stepping with Counter Support  - 1 x daily - 5-6 x weekly - 2 sets - 10 reps - Seated Long Arc Quad with Ankle Weight  - 1 x daily - 3 x weekly - 3 sets - 10 reps  ASSESSMENT:  CLINICAL IMPRESSION:  Pt is making in progress in PT as evidenced by objective findings.  Pt has limited subjective improvements though does report some improvement with gait.  She has not had any falls since starting PT. She continues to have difficulty with stairs.  Upon prior testing, she demonstrates improved ABC balance score and 5x STS test time.  She continues to have some difficulty with sit/stand transfers though has made good progress.  Pt continues to have significant gait deficits including decreased step clearance bilat.  Pt entered the clinic without AD and had some unsteadiness with gait without SPC.  Pt is more steady with SPC and was instructed to use her cane.  Pt is still at fall risk though demonstrates improved score on Berg Balance Assessment from 36/56 to 41/56.  Pt's LE strength has not improved based upon MMT from the initial eval except some minimal improvement in L hip flexion.  Pt has met STG #1 and LTG's #1,6.  Pt should benefit from cont skilled PT services to address ongoing goals and to improve balance, strength, and function.       OBJECTIVE IMPAIRMENTS: decreased activity tolerance, difficulty walking, decreased balance, decreased endurance, decreased mobility, decreased ROM, decreased strength, impaired flexibility, impaired UE/LE use, postural dysfunction, and pain.  ACTIVITY LIMITATIONS: bending, lifting, carry, locomotion, cleaning, community activity, driving, and or occupation  PERSONAL FACTORS: Arthritis, Osteopenia, Peripheral neuropathy.  are also affecting patient's functional outcome.  REHAB POTENTIAL: Good  CLINICAL DECISION MAKING: Stable/uncomplicated  EVALUATION COMPLEXITY: Low   GOALS: Short  term PT Goals Target date:11/29/2022 Pt will be I and compliant with HEP. Baseline:  Goal status: MET  Long term PT goals Target date: 02/13/2023 Pt will improve BERG by at least 3 points in order to demonstrate clinically significant improvement in balance.   Baseline:  Goal status: GOAL MET Pt will improve hip/knee strength to at least 5-/5 MMT to improve functional strength Baseline: Goal status: ONGOING Pt will report <1 fall during her duration in PT.         Baseline         Goal status: GOAL MET Pt will be able to ambulate community distances at least 1000 ft WNL gait pattern without complaints Baseline: Goal status: ONGOING       5. Pt will be able to negotiate stairs with a reciprocal gait pattern       Baseline:        Goal status: ONGOING       6. Pt will improve her STS by 2.3 seconds for Minimal clinically important difference.         Baseline:          Goal status: GOAL MET   PLAN: PT FREQUENCY: 1-2 times per week   PT DURATION: 6 weeks  PLANNED INTERVENTIONS (unless contraindicated): aquatic PT, Canalith repositioning, cryotherapy, Electrical stimulation, Iontophoresis with 4 mg/ml dexamethasome, Moist heat, traction, Ultrasound, gait training,  Therapeutic exercise, balance training, neuromuscular re-education, patient/family education, prosthetic training, manual techniques, passive ROM, dry needling, taping, vasopnuematic device, vestibular, spinal manipulations, joint manipulations  PLAN FOR NEXT SESSION:  Cont with LE strengthening, balance, stair negotiation and gait.  PN completed and will send recert.  Selinda Michaels III PT, DPT 01/03/23 10:11 AM

## 2023-01-02 ENCOUNTER — Ambulatory Visit (HOSPITAL_BASED_OUTPATIENT_CLINIC_OR_DEPARTMENT_OTHER): Payer: Medicare Other | Admitting: Physical Therapy

## 2023-01-02 DIAGNOSIS — R2681 Unsteadiness on feet: Secondary | ICD-10-CM

## 2023-01-02 DIAGNOSIS — M6281 Muscle weakness (generalized): Secondary | ICD-10-CM

## 2023-01-02 DIAGNOSIS — R2689 Other abnormalities of gait and mobility: Secondary | ICD-10-CM

## 2023-01-03 ENCOUNTER — Encounter (HOSPITAL_BASED_OUTPATIENT_CLINIC_OR_DEPARTMENT_OTHER): Payer: Self-pay | Admitting: Physical Therapy

## 2023-01-10 ENCOUNTER — Encounter (HOSPITAL_BASED_OUTPATIENT_CLINIC_OR_DEPARTMENT_OTHER): Payer: Self-pay | Admitting: Physical Therapy

## 2023-01-10 ENCOUNTER — Ambulatory Visit (HOSPITAL_BASED_OUTPATIENT_CLINIC_OR_DEPARTMENT_OTHER): Payer: Medicare Other | Attending: Internal Medicine | Admitting: Physical Therapy

## 2023-01-10 DIAGNOSIS — R2681 Unsteadiness on feet: Secondary | ICD-10-CM | POA: Diagnosis present

## 2023-01-10 DIAGNOSIS — M6281 Muscle weakness (generalized): Secondary | ICD-10-CM | POA: Insufficient documentation

## 2023-01-10 DIAGNOSIS — R2689 Other abnormalities of gait and mobility: Secondary | ICD-10-CM | POA: Diagnosis present

## 2023-01-10 NOTE — Therapy (Addendum)
OUTPATIENT PHYSICAL THERAPY LOWER EXTREMITY TREATMENT       Patient Name: Hailey Perkins MRN: [id number] DOB:09-22-1935, 88 y.o., female Today's Date: 01/11/2023  END OF SESSION:  PT End of Session - 01/10/23 1034     Visit Number 11    Number of Visits 18    Date for PT Re-Evaluation 02/13/23    Authorization Type [employer] MEDICARE    PT Start Time 1025    PT Stop Time 1055   treatment time limited today due to pt needing to take a call   PT Time Calculation (min) 30 min    Activity Tolerance Patient tolerated treatment well    Behavior During Therapy [facility] for tasks assessed/performed                    Past Medical History:  Diagnosis Date   Aortic atherosclerosis ([place])    Arthritis    Cataract 2010   bilateral eyes   Chicken pox    Cholelithiasis    Diverticulitis    Diverticulosis    GERD (gastroesophageal reflux disease)    Glaucoma    Hiatal hernia    History of frequent urinary tract infections    Hyperlipidemia    Osteopenia    Peripheral neuropathy    Sleep apnea    wears a CPAP   Urine incontinence    Past Surgical History:  Procedure Laterality Date   ABDOMINAL HYSTERECTOMY  1977   ANAL FISTULECTOMY  1985   ANKLE FRACTURE SURGERY Right 2014   APPENDECTOMY     BIOPSY  02/15/2021   Procedure: BIOPSY;  Surgeon: Irving Copas., MD;  Location: [facility];  Service: Gastroenterology;;   BREAST BIOPSY Bilateral 10+ yrs ago   BENIGN   BUNIONECTOMY Left 1975   Left Toe   CATARACT EXTRACTION, BILATERAL  2009   COLONOSCOPY WITH PROPOFOL N/A 02/15/2021   Procedure: COLONOSCOPY WITH PROPOFOL;  Surgeon: Irving Copas., MD;  Location: [facility];  Service: Gastroenterology;  Laterality: N/A;  ultraslimscope    DILATION AND CURETTAGE OF UTERUS  1972   eye [place] vision correction Left 10/09/2013   eye socket re-formed Left 2014   FOOT NEUROMA SURGERY Right 2003   plus bunionectomy, right great toe   HAMMER TOE SURGERY  Right 2005   Right foot correction hammertoe little toe   HEMORRHOID SURGERY  1967   HIP SURGERY Right 2014   right hip femur pin implanted   KNEE ARTHROSCOPY Left 2010   debridement left knee scar tissue   LAPAROSCOPIC OOPHERECTOMY     1986, [place] Right 2003   Right Foot Neuroma, plus Bunionectomy, Right Great Toe   POLYPECTOMY  02/15/2021   Procedure: POLYPECTOMY;  Surgeon: Mansouraty, Telford Nab., MD;  Location: [facility];  Service: Gastroenterology;;   [place] Left 01/2008   REPLACEMENT TOTAL KNEE Right 08/2008   retinal pucker correction Left 07/14/2012   schwannoma tumor (benign) removal  2011   right rib   [place]   Patient Active Problem List   Diagnosis Date Noted   Chronic pain of right knee 05/27/2021   OSA on CPAP 05/27/2021   LLQ pain 12/16/2020   History of diverticulitis 12/16/2020   Hematochezia 12/16/2020   Abnormal colonoscopy 12/16/2020   Diverticulitis of colon 08/15/2020  Sleep-related hypoxia 08/05/2019   OSA (obstructive sleep apnea) 08/05/2019   PLMD (periodic limb movement disorder) 08/05/2019   Cerebellar 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) 05/06/2019   Excessive daytime sleepiness 05/06/2019   Loud snoring 05/06/2019   Ventral hernia without obstruction or gangrene 02/22/2019   Chronic throat clearing 01/18/2019   Nocturnal cough 01/18/2019   Family history of colon cancer in father 01/18/2019   Diverticulosis 01/18/2019   Gait abnormality 12/27/2018   Numbness 12/27/2018   Status post total bilateral knee replacement 11/30/2018   DDD (degenerative disc disease), cervical 11/30/2018   Status post carpal tunnel release 11/30/2018   DDD (degenerative disc disease), lumbar 10/26/2018   Primary osteoarthritis of both hands 10/26/2018   Primary osteoarthritis of both feet 10/26/2018    Gastroesophageal reflux disease 08/27/2018   Bilateral impacted cerumen 08/21/2018   Sensorineural hearing loss (SNHL) of both ears 08/21/2018   Tinnitus, bilateral 08/21/2018   Blood in urine 04/17/2018   Secondary open-angle glaucoma of left eye, moderate stage 03/10/2018   Early stage 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 10/05/2017   Epiretinal membrane (ERM) of left eye 10/05/2017   Pseudophakia of both eyes 10/05/2017   Osteopenia 03/19/2017   Mixed hyperlipidemia 08/18/2016   Generalized osteoarthritis of multiple sites 08/18/2016   Peripheral neuropathic pain 08/18/2016   Overactive bladder 08/18/2016   Glaucoma 08/18/2016   Cervical disc disorder with radiculopathy of cervical region 08/17/2016   Carpal tunnel syndrome, right upper limb 08/17/2016   Abnormal finding on mammography 06/14/2016   Breast lump 05/06/2015    PCP: Garlan Fillers, MD  REFERRING PROVIDER: Garlan Fillers, MD   REFERRING DIAG: Unsteadiness on feet [R26.81]   THERAPY DIAG:  Muscle weakness (generalized)  Other abnormalities of gait and mobility  Unsteadiness on feet  Rationale for Evaluation and Treatment: Rehabilitation  ONSET DATE: 2 years ago  SUBJECTIVE:   SUBJECTIVE STATEMENT: Pt denies any adverse effects after prior Rx.  Pt reports compliance with HEP, just unable to perform resisted DF.  Pt states she did too much with the aquatic exercises on Friday.  She sat in the shallow water and performed leg raises.  She then had R anterior hip pain.  Pt states her hip has been bothering her, but it doesn't hurt at rest or in standing.  Pt denies any new functional improvements.  Pt continues to have difficulty with stairs.  She reports no change with performing her daughters stairs at home.    PERTINENT HISTORY: Arthritis, Osteopenia, Peripheral neuropathy, and bilat TKR. PAIN:  Are you having pain? No  PRECAUTIONS: None  WEIGHT BEARING RESTRICTIONS:  No  FALLS:  Has patient fallen in last 6 months? Yes. Number of falls 2  LIVING ENVIRONMENT: Lives with: lives alone Lives in: House/apartment Stairs: No Has following equipment at home: Single point cane, shower chair, and Grab bars  OCCUPATION: Retired  PLOF: Independent  PATIENT GOALS: Pt would like to improve her strength in bilat LE's and improve her balance.   NEXT MD VISIT:   OBJECTIVE:   DIAGNOSTIC FINDINGS: None at this time.     TODAY'S TREATMENT:  12/21/2022: Prior Rx: PATIENT SURVEYS:  ABC scale:  Initial/Current:  50.6 % / 66.25%   5 times sit to stand test:  Initial: 18.2, pt requires cues for standing up tall, and utilizes hands. Current:  19 sec without UE support / 14 sec with UE support    Today's Treatment:    -Reviewed current function, HEP compliance, response to prior Rx, and pain level.  Pt performed: Nustep L4 x5 mins UE/LE's  Step ups on 4 inch with and without UE support with CGA/min assist 2x10 each LE Lateral step ups on 4 inch step  Neuromuscular Re-education: Toe Tapping without UE support with CGA/min assist 2x10 reps Marching on airex 2x10 reps with UE support with CGA and without UE support with mod Assist Standing on Airex 2x30 sec with FT [place] with bilat UE support on rail and occasionally without UE assist with CGA/min Assist x 2 laps    PATIENT EDUCATION:  Education details:  PT instructed pt to use her cane with ambulation due to her gait.  POC, exercise form and rationale. Person educated: Patient Education method: Customer service manager, verbal cues Education comprehension: verbalized understanding and returned demonstration, verbal cues required  HOME EXERCISE PROGRAM: Access Code: [email or web address] URL: [email or web address] Date: 11/10/2022 Prepared by: Ronny Flurry  Exercises - Seated March  - 1 x daily - 5-7 x weekly - 2 sets - 10 reps - Seated Toe Raise  - 1 x daily - 7 x weekly - 2 sets - 10 reps - Seated Heel Raise  - 1 x daily - 5-7 x weekly - 2 sets - 10 reps - Long Sitting Ankle Dorsiflexion with Anchored Resistance  - 1 x daily - 3-4 x weekly - 2 sets - 10 reps - Seated Hip Abduction with Resistance  - 1 x daily - 3 x weekly - 2 sets - 10 reps - Romberg Stance  - 1 x daily - 5-7 x weekly - 2-3 reps - 30 seconds hold   - Sit to Stand  - 1 x daily - 7 x weekly - 2 sets - 10 reps - Side Stepping with Counter Support  - 1 x daily - 5-6 x weekly - 2 sets - 10 reps - Seated Long Arc Quad with Ankle Weight  - 1 x daily - 3 x weekly - 3 sets - 10 reps  ASSESSMENT:  CLINICAL IMPRESSION:  Pt entered the clinic with cane.  She presents to Rx reporting her R hip has been bothering her due to exercises in the pool.  Pt performed exercises today without any c/o's of hip pain or irritation.  Pt continues to report difficulty with performing stairs and has no improvement with performing daughter's steps at her home.  Pt received a call from her daughter which was emergency about her security system during Rx.  Rx time was limited today due to phone call. Pt has stability and proprioceptive deficits with dynamic exercises requiring PT guarding/assistance and UE support with some exercises.  Pt gave good effort with all exercises.  She responded well to Rx having no c/o's after Rx.  Pt should benefit from cont skilled PT services to address ongoing goals and to improve balance, strength, and function.         OBJECTIVE IMPAIRMENTS: decreased activity tolerance, difficulty walking, decreased balance, decreased endurance, decreased mobility, decreased ROM, decreased strength, impaired flexibility, impaired UE/LE use, postural dysfunction, and pain.  ACTIVITY LIMITATIONS: bending, lifting, carry, locomotion, cleaning, community activity, driving, and  or  occupation  PERSONAL FACTORS: Arthritis, Osteopenia, Peripheral neuropathy.  are also affecting patient's functional outcome.  REHAB POTENTIAL: Good  CLINICAL DECISION MAKING: Stable/uncomplicated  EVALUATION COMPLEXITY: Low   GOALS: Short term PT Goals Target date:11/29/2022 Pt will be I and compliant with HEP. Baseline:  Goal status: MET  Long term PT goals Target date: 02/13/2023 Pt will improve BERG by at least 3 points in order to demonstrate clinically significant improvement in balance.   Baseline:  Goal status: GOAL MET Pt will improve hip/knee strength to at least 5-/5 MMT to improve functional strength Baseline: Goal status: ONGOING Pt will report <1 fall during her duration in PT.         Baseline         Goal status: GOAL MET Pt will be able to ambulate community distances at least 1000 ft WNL gait pattern without complaints Baseline: Goal status: ONGOING       5. Pt will be able to negotiate stairs with a reciprocal gait pattern       Baseline:        Goal status: ONGOING       6. Pt will improve her STS by 2.3 seconds for Minimal clinically important difference.         Baseline:          Goal status: GOAL MET   PLAN: PT FREQUENCY: 1-2 times per week   PT DURATION: 6 weeks  PLANNED INTERVENTIONS (unless contraindicated): aquatic PT, Canalith repositioning, cryotherapy, Electrical stimulation, Iontophoresis with 4 mg/ml dexamethasome, Moist heat, traction, Ultrasound, gait training, Therapeutic exercise, balance training, neuromuscular re-education, patient/family education, prosthetic training, manual techniques, passive ROM, dry needling, taping, vasopnuematic device, vestibular, spinal manipulations, joint manipulations  PLAN FOR NEXT SESSION:  Cont with LE strengthening, balance, stair negotiation and gait.  Cont to work on step ups.   Selinda Michaels III PT, DPT 01/11/23 2:20 PM

## 2023-01-17 ENCOUNTER — Encounter (HOSPITAL_BASED_OUTPATIENT_CLINIC_OR_DEPARTMENT_OTHER): Payer: Self-pay | Admitting: Physical Therapy

## 2023-01-17 ENCOUNTER — Ambulatory Visit (HOSPITAL_BASED_OUTPATIENT_CLINIC_OR_DEPARTMENT_OTHER): Payer: Medicare Other | Admitting: Physical Therapy

## 2023-01-17 DIAGNOSIS — R2681 Unsteadiness on feet: Secondary | ICD-10-CM

## 2023-01-17 DIAGNOSIS — M6281 Muscle weakness (generalized): Secondary | ICD-10-CM | POA: Diagnosis not present

## 2023-01-17 DIAGNOSIS — R2689 Other abnormalities of gait and mobility: Secondary | ICD-10-CM

## 2023-01-17 NOTE — Therapy (Signed)
OUTPATIENT PHYSICAL THERAPY LOWER EXTREMITY TREATMENT       Patient Name: Hailey Perkins MRN: [id number] DOB:July 20, 1935, 88 y.o., female Today's Date: 01/18/2023  END OF SESSION:  PT End of Session - 01/17/23 1037     Visit Number 12    Number of Visits 18    Date for PT Re-Evaluation 02/13/23    Authorization Type [employer] MEDICARE    PT Start Time 1020    PT Stop Time 1100    PT Time Calculation (min) 40 min    Activity Tolerance Patient tolerated treatment well    Behavior During Therapy [facility] for tasks assessed/performed                     Past Medical History:  Diagnosis Date   Aortic atherosclerosis ([place])    Arthritis    Cataract 2010   bilateral eyes   Chicken pox    Cholelithiasis    Diverticulitis    Diverticulosis    GERD (gastroesophageal reflux disease)    Glaucoma    Hiatal hernia    History of frequent urinary tract infections    Hyperlipidemia    Osteopenia    Peripheral neuropathy    Sleep apnea    wears a CPAP   Urine incontinence    Past Surgical History:  Procedure Laterality Date   ABDOMINAL HYSTERECTOMY  1977   ANAL FISTULECTOMY  1985   ANKLE FRACTURE SURGERY Right 2014   APPENDECTOMY     BIOPSY  02/15/2021   Procedure: BIOPSY;  Surgeon: Irving Copas., MD;  Location: [facility];  Service: Gastroenterology;;   BREAST BIOPSY Bilateral 10+ yrs ago   BENIGN   BUNIONECTOMY Left 1975   Left Toe   CATARACT EXTRACTION, BILATERAL  2009   COLONOSCOPY WITH PROPOFOL N/A 02/15/2021   Procedure: COLONOSCOPY WITH PROPOFOL;  Surgeon: Irving Copas., MD;  Location: [facility];  Service: Gastroenterology;  Laterality: N/A;  ultraslimscope    DILATION AND CURETTAGE OF UTERUS  1972   eye [place] vision correction Left 10/09/2013   eye socket re-formed Left 2014   FOOT NEUROMA SURGERY Right 2003   plus bunionectomy, right great toe   HAMMER TOE SURGERY Right 2005   Right foot correction hammertoe little toe    HEMORRHOID SURGERY  1967   HIP SURGERY Right 2014   right hip femur pin implanted   KNEE ARTHROSCOPY Left 2010   debridement left knee scar tissue   LAPAROSCOPIC OOPHERECTOMY     1986, [place] Right 2003   Right Foot Neuroma, plus Bunionectomy, Right Great Toe   POLYPECTOMY  02/15/2021   Procedure: POLYPECTOMY;  Surgeon: Irving Copas., MD;  Location: [facility];  Service: Gastroenterology;;   [place] Left 01/2008   REPLACEMENT TOTAL KNEE Right 08/2008   retinal pucker correction Left 07/14/2012   schwannoma tumor (benign) removal  2011   right rib   [place]   Patient Active Problem List   Diagnosis Date Noted   Chronic pain of right knee 05/27/2021   OSA on CPAP 05/27/2021   LLQ pain 12/16/2020   History of diverticulitis 12/16/2020   Hematochezia 12/16/2020   Abnormal colonoscopy 12/16/2020   Diverticulitis of colon 08/15/2020   Sleep-related hypoxia 08/05/2019   OSA (obstructive sleep apnea) 08/05/2019  PLMD (periodic limb movement disorder) 08/05/2019   Cerebellar 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 ([place]) 05/06/2019   Excessive daytime sleepiness 05/06/2019   Loud snoring 05/06/2019   Ventral hernia without obstruction or gangrene 02/22/2019   Chronic throat clearing 01/18/2019   Nocturnal cough 01/18/2019   Family history of colon cancer in father 01/18/2019   Diverticulosis 01/18/2019   Gait abnormality 12/27/2018   Numbness 12/27/2018   Status post total bilateral knee replacement 11/30/2018   DDD (degenerative disc disease), cervical 11/30/2018   Status post carpal tunnel release 11/30/2018   DDD (degenerative disc disease), lumbar 10/26/2018   Primary osteoarthritis of both hands 10/26/2018   Primary osteoarthritis of both feet 10/26/2018   Gastroesophageal reflux disease 08/27/2018   Bilateral  impacted cerumen 08/21/2018   Sensorineural hearing loss (SNHL) of both ears 08/21/2018   Tinnitus, bilateral 08/21/2018   Blood in urine 04/17/2018   Secondary open-angle glaucoma of left eye, moderate stage 03/10/2018   Early stage 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 10/05/2017   Epiretinal membrane (ERM) of left eye 10/05/2017   Pseudophakia of both eyes 10/05/2017   Osteopenia 03/19/2017   Mixed hyperlipidemia 08/18/2016   Generalized osteoarthritis of multiple sites 08/18/2016   Peripheral neuropathic pain 08/18/2016   Overactive bladder 08/18/2016   Glaucoma 08/18/2016   Cervical disc disorder with radiculopathy of cervical region 08/17/2016   Carpal tunnel syndrome, right upper limb 08/17/2016   Abnormal finding on mammography 06/14/2016   Breast lump 05/06/2015    PCP: Donnajean Lopes, MD  REFERRING PROVIDER: Donnajean Lopes, MD   REFERRING DIAG: Unsteadiness on feet [R26.81]   THERAPY DIAG:  Muscle weakness (generalized)  Other abnormalities of gait and mobility  Unsteadiness on feet  Rationale for Evaluation and Treatment: Rehabilitation  ONSET DATE: 2 years ago  SUBJECTIVE:   SUBJECTIVE STATEMENT: Pt reports the R ant hip pain has been improving.  She is a little more tender at the end of the day, but she doesn't have the pain when she first wakes up.  Pt states she doesn't remember any adverse effects after prior Rx.  Pt reports compliance with HEP though hasn't done knee extensions due to her hip and also unable to perform resisted DF.  Pt continues to have difficulty with stairs.      PERTINENT HISTORY: Arthritis, Osteopenia, Peripheral neuropathy, and bilat TKR. PAIN:  Are you having pain? No  PRECAUTIONS: None  WEIGHT BEARING RESTRICTIONS: No  FALLS:  Has patient fallen in last 6 months? Yes. Number of falls 2  LIVING ENVIRONMENT: Lives with: lives alone Lives in: House/apartment Stairs: No Has following  equipment at home: Single point cane, shower chair, and Grab bars  OCCUPATION: Retired  PLOF: Independent  PATIENT GOALS: Pt would like to improve her strength in bilat LE's and improve her balance.   NEXT MD VISIT:   OBJECTIVE:   DIAGNOSTIC FINDINGS: None at this time.     TODAY'S TREATMENT:                                                                                                                                  -  Reviewed current function, HEP compliance, response to prior Rx, and pain level.  Pt performed: Nustep L4 x5 mins UE/LE's  Resisted DF with RTB 3x10 bilat Step ups on 4 inch with UE support with CGA 2x10 each LE Lateral step ups on 4 inch step Sit to stands from table x 10 reps and 2x10 reps with RTB around thighs  Neuromuscular Re-education: Toe Tapping with occasional UE support with CGA/min to mod assist 2x10 reps Staggered stance with CGA and occasional UE support 2x30 sec Tandem stance with bilat UE support x 30 sec each with CGA Step to and reciprocal gait over 4 hurdles with CGA with UE support  Sidestepping without UE support x 3 laps      PATIENT EDUCATION:  Education details:  POC, exercise form and rationale. Person educated: Patient Education method: Customer service manager, verbal cues Education comprehension: verbalized understanding and returned demonstration, verbal cues required  HOME EXERCISE PROGRAM: Access Code: [email or web address] URL: [email or web address] Date: 11/10/2022 Prepared by: Ronny Flurry  Exercises - Seated March  - 1 x daily - 5-7 x weekly - 2 sets - 10 reps - Seated Toe Raise  - 1 x daily - 7 x weekly - 2 sets - 10 reps - Seated Heel Raise  - 1 x daily - 5-7 x weekly - 2 sets - 10 reps - Long Sitting Ankle Dorsiflexion with Anchored Resistance  - 1 x daily - 3-4 x weekly - 2 sets - 10 reps - Seated Hip Abduction with Resistance  - 1 x daily - 3 x weekly - 2 sets - 10 reps - Romberg Stance  - 1 x  daily - 5-7 x weekly - 2-3 reps - 30 seconds hold   - Sit to Stand  - 1 x daily - 7 x weekly - 2 sets - 10 reps - Side Stepping with Counter Support  - 1 x daily - 5-6 x weekly - 2 sets - 10 reps - Seated Long Arc Quad with Ankle Weight  - 1 x daily - 3 x weekly - 3 sets - 10 reps  ASSESSMENT:  CLINICAL IMPRESSION:  Pt's hip is feeling better.  Pt performed exercises well with cuing and instruction in correct form and appropriate guarding.  Pt has balance deficits and requires UE support with the majority of standing/balance exercises.  Pt gave good effort with all exercises.  She responded well to Rx having no c/o's after Rx.  She had no c/o's of hip pain during Rx.  Pt should benefit from cont skilled PT services to address ongoing goals and to improve balance, strength, and function.         OBJECTIVE IMPAIRMENTS: decreased activity tolerance, difficulty walking, decreased balance, decreased endurance, decreased mobility, decreased ROM, decreased strength, impaired flexibility, impaired UE/LE use, postural dysfunction, and pain.  ACTIVITY LIMITATIONS: bending, lifting, carry, locomotion, cleaning, community activity, driving, and or occupation  PERSONAL FACTORS: Arthritis, Osteopenia, Peripheral neuropathy.  are also affecting patient's functional outcome.  REHAB POTENTIAL: Good  CLINICAL DECISION MAKING: Stable/uncomplicated  EVALUATION COMPLEXITY: Low   GOALS: Short term PT Goals Target date:11/29/2022 Pt will be I and compliant with HEP. Baseline:  Goal status: MET  Long term PT goals Target date: 02/13/2023 Pt will improve BERG by at least 3 points in order to demonstrate clinically significant improvement in balance.   Baseline:  Goal status: GOAL MET Pt will improve hip/knee strength to at least 5-/5 MMT to improve functional strength Baseline: Goal status: ONGOING Pt will  report <1 fall during her duration in PT.         Baseline         Goal status: GOAL MET Pt  will be able to ambulate community distances at least 1000 ft WNL gait pattern without complaints Baseline: Goal status: ONGOING       5. Pt will be able to negotiate stairs with a reciprocal gait pattern       Baseline:        Goal status: ONGOING       6. Pt will improve her STS by 2.3 seconds for Minimal clinically important difference.         Baseline:          Goal status: GOAL MET   PLAN: PT FREQUENCY: 1-2 times per week   PT DURATION: 6 weeks  PLANNED INTERVENTIONS (unless contraindicated): aquatic PT, Canalith repositioning, cryotherapy, Electrical stimulation, Iontophoresis with 4 mg/ml dexamethasome, Moist heat, traction, Ultrasound, gait training, Therapeutic exercise, balance training, neuromuscular re-education, patient/family education, prosthetic training, manual techniques, passive ROM, dry needling, taping, vasopnuematic device, vestibular, spinal manipulations, joint manipulations  PLAN FOR NEXT SESSION:  Cont with LE strengthening, balance, stair negotiation and gait.  Cont to work on step ups.   Selinda Michaels III PT, DPT 01/18/23 7:43 AM

## 2023-01-24 ENCOUNTER — Ambulatory Visit (HOSPITAL_BASED_OUTPATIENT_CLINIC_OR_DEPARTMENT_OTHER): Payer: Medicare Other | Admitting: Physical Therapy

## 2023-01-24 ENCOUNTER — Encounter (HOSPITAL_BASED_OUTPATIENT_CLINIC_OR_DEPARTMENT_OTHER): Payer: Self-pay | Admitting: Physical Therapy

## 2023-01-24 DIAGNOSIS — M6281 Muscle weakness (generalized): Secondary | ICD-10-CM

## 2023-01-24 DIAGNOSIS — R2681 Unsteadiness on feet: Secondary | ICD-10-CM

## 2023-01-24 DIAGNOSIS — R2689 Other abnormalities of gait and mobility: Secondary | ICD-10-CM

## 2023-01-24 NOTE — Therapy (Signed)
OUTPATIENT PHYSICAL THERAPY LOWER EXTREMITY TREATMENT       Patient Name: Hailey Perkins MRN: [id number] DOB:06-02-1935, 88 y.o., female Today's Date: 01/25/2023  END OF SESSION:  PT End of Session - 01/24/23 1042     Visit Number 13    Number of Visits 18    Date for PT Re-Evaluation 02/13/23    Authorization Type [employer] MEDICARE    PT Start Time 1028    PT Stop Time 1113    PT Time Calculation (min) 45 min    Activity Tolerance Patient tolerated treatment well    Behavior During Therapy WFL for tasks assessed/performed                     Past Medical History:  Diagnosis Date   Aortic atherosclerosis ([place])    Arthritis    Cataract 2010   bilateral eyes   Chicken pox    Cholelithiasis    Diverticulitis    Diverticulosis    GERD (gastroesophageal reflux disease)    Glaucoma    Hiatal hernia    History of frequent urinary tract infections    Hyperlipidemia    Osteopenia    Peripheral neuropathy    Sleep apnea    wears a CPAP   Urine incontinence    Past Surgical History:  Procedure Laterality Date   ABDOMINAL HYSTERECTOMY  1977   ANAL FISTULECTOMY  1985   ANKLE FRACTURE SURGERY Right 2014   APPENDECTOMY     BIOPSY  02/15/2021   Procedure: BIOPSY;  Surgeon: Irving Copas., MD;  Location: [facility];  Service: Gastroenterology;;   BREAST BIOPSY Bilateral 10+ yrs ago   BENIGN   BUNIONECTOMY Left 1975   Left Toe   CATARACT EXTRACTION, BILATERAL  2009   COLONOSCOPY WITH PROPOFOL N/A 02/15/2021   Procedure: COLONOSCOPY WITH PROPOFOL;  Surgeon: Irving Copas., MD;  Location: [facility];  Service: Gastroenterology;  Laterality: N/A;  ultraslimscope    DILATION AND CURETTAGE OF UTERUS  1972   eye [place] vision correction Left 10/09/2013   eye socket re-formed Left 2014   FOOT NEUROMA SURGERY Right 2003   plus bunionectomy, right great toe   HAMMER TOE SURGERY Right 2005   Right foot correction hammertoe little toe    HEMORRHOID SURGERY  1967   HIP SURGERY Right 2014   right hip femur pin implanted   KNEE ARTHROSCOPY Left 2010   debridement left knee scar tissue   LAPAROSCOPIC OOPHERECTOMY     1986, [place] Right 2003   Right Foot Neuroma, plus Bunionectomy, Right Great Toe   POLYPECTOMY  02/15/2021   Procedure: POLYPECTOMY;  Surgeon: Irving Copas., MD;  Location: [facility];  Service: Gastroenterology;;   [place] Left 01/2008   REPLACEMENT TOTAL KNEE Right 08/2008   retinal pucker correction Left 07/14/2012   schwannoma tumor (benign) removal  2011   right rib   [place]   Patient Active Problem List   Diagnosis Date Noted   Chronic pain of right knee 05/27/2021   OSA on CPAP 05/27/2021   LLQ pain 12/16/2020   History of diverticulitis 12/16/2020   Hematochezia 12/16/2020   Abnormal colonoscopy 12/16/2020   Diverticulitis of colon 08/15/2020   Sleep-related hypoxia 08/05/2019   OSA (obstructive sleep apnea) 08/05/2019  PLMD (periodic limb movement disorder) 08/05/2019   Cerebellar 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 ([place]) 05/06/2019   Excessive daytime sleepiness 05/06/2019   Loud snoring 05/06/2019   Ventral hernia without obstruction or gangrene 02/22/2019   Chronic throat clearing 01/18/2019   Nocturnal cough 01/18/2019   Family history of colon cancer in father 01/18/2019   Diverticulosis 01/18/2019   Gait abnormality 12/27/2018   Numbness 12/27/2018   Status post total bilateral knee replacement 11/30/2018   DDD (degenerative disc disease), cervical 11/30/2018   Status post carpal tunnel release 11/30/2018   DDD (degenerative disc disease), lumbar 10/26/2018   Primary osteoarthritis of both hands 10/26/2018   Primary osteoarthritis of both feet 10/26/2018   Gastroesophageal reflux disease 08/27/2018   Bilateral  impacted cerumen 08/21/2018   Sensorineural hearing loss (SNHL) of both ears 08/21/2018   Tinnitus, bilateral 08/21/2018   Blood in urine 04/17/2018   Secondary open-angle glaucoma of left eye, moderate stage 03/10/2018   Early stage 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 10/05/2017   Epiretinal membrane (ERM) of left eye 10/05/2017   Pseudophakia of both eyes 10/05/2017   Osteopenia 03/19/2017   Mixed hyperlipidemia 08/18/2016   Generalized osteoarthritis of multiple sites 08/18/2016   Peripheral neuropathic pain 08/18/2016   Overactive bladder 08/18/2016   Glaucoma 08/18/2016   Cervical disc disorder with radiculopathy of cervical region 08/17/2016   Carpal tunnel syndrome, right upper limb 08/17/2016   Abnormal finding on mammography 06/14/2016   Breast lump 05/06/2015    PCP: Donnajean Lopes, MD  REFERRING PROVIDER: Donnajean Lopes, MD   REFERRING DIAG: Unsteadiness on feet [R26.81]   THERAPY DIAG:  Muscle weakness (generalized)  Other abnormalities of gait and mobility  Unsteadiness on feet  Rationale for Evaluation and Treatment: Rehabilitation  ONSET DATE: 2 years ago  SUBJECTIVE:   SUBJECTIVE STATEMENT: Pt denies any adverse effects after from prior Rx.  Pt denies any improvement with gait or DF with gait.  Pt reports improved performance of daily transfers.  Pt states her R hip feels fine now.  Pt reports compliance with HEP though unable to perform resisted DF.  Pt continues to have difficulty with stairs.      PERTINENT HISTORY: Arthritis, Osteopenia, Peripheral neuropathy, and bilat TKR. PAIN:  Are you having pain? Yes 3/10 L hip pain.  Pt states she woke up with L hip pain today.   She denies any knee pain.   PRECAUTIONS: None  WEIGHT BEARING RESTRICTIONS: No  FALLS:  Has patient fallen in last 6 months? Yes. Number of falls 2  LIVING ENVIRONMENT: Lives with: lives alone Lives in: House/apartment Stairs: No Has  following equipment at home: Single point cane, shower chair, and Grab bars  OCCUPATION: Retired  PLOF: Independent  PATIENT GOALS: Pt would like to improve her strength in bilat LE's and improve her balance.   NEXT MD VISIT:   OBJECTIVE:   DIAGNOSTIC FINDINGS: None at this time.     TODAY'S TREATMENT:  Ther Ex: -Reviewed current function, HEP compliance, response to prior Rx, and pain level.  Pt performed: Nustep L4 x5 mins UE/LE's  Seated toe raises 3x10 Resisted DF with RTB 3x10 bilat   Therapeutic Activities: Pt performed 1 flight of stairs with a step to gait with rail with SBA/CGA x 2 reps Sit to stands from table x 10 reps and 2x10 reps with RTB around thighs  Neuromuscular Re-education: Toe Tapping with occasional UE support with CGA/min to mod assist 2x10 Tandem stance with bilat UE support 2 x 30 sec each with CGA Step to and reciprocal gait over 4 hurdles with CGA with UE support  Lateral stepping over 4 hurdles with CGA with UE support Tandem gait with UE support with CGA      PATIENT EDUCATION:  Education details:  POC, exercise form and rationale. Person educated: Patient Education method: Customer service manager, verbal cues Education comprehension: verbalized understanding and returned demonstration, verbal cues required  HOME EXERCISE PROGRAM: Access Code: [email or web address] URL: [email or web address] Date: 11/10/2022 Prepared by: Ronny Flurry  Exercises - Seated March  - 1 x daily - 5-7 x weekly - 2 sets - 10 reps - Seated Toe Raise  - 1 x daily - 7 x weekly - 2 sets - 10 reps - Seated Heel Raise  - 1 x daily - 5-7 x weekly - 2 sets - 10 reps - Long Sitting Ankle Dorsiflexion with Anchored Resistance  - 1 x daily - 3-4 x weekly - 2 sets - 10 reps - Seated Hip Abduction with Resistance  - 1 x daily - 3 x  weekly - 2 sets - 10 reps - Romberg Stance  - 1 x daily - 5-7 x weekly - 2-3 reps - 30 seconds hold   - Sit to Stand  - 1 x daily - 7 x weekly - 2 sets - 10 reps - Side Stepping with Counter Support  - 1 x daily - 5-6 x weekly - 2 sets - 10 reps - Seated Long Arc Quad with Ankle Weight  - 1 x daily - 3 x weekly - 3 sets - 10 reps  ASSESSMENT:  CLINICAL IMPRESSION:  Pt reports improved performance of sit/stand transfers though no improvement in gait.  She continues to have decreased foot clearance with gait.  Pt continues to have difficulty with stairs.  Pt ascended and descended a flight of stairs today.  She requires UE assistance on rail to perform stairs, performed step to gait pattern, and has increased difficulty on R > L LE.  Pt has balance/proprioception deficits and required UE support with balance exercises.  Pt gave good effort with all exercises.  She responded well to Rx having no c/o's after Rx.  Pt should benefit from cont skilled PT services to address ongoing goals and to improve balance, strength, and function.        OBJECTIVE IMPAIRMENTS: decreased activity tolerance, difficulty walking, decreased balance, decreased endurance, decreased mobility, decreased ROM, decreased strength, impaired flexibility, impaired UE/LE use, postural dysfunction, and pain.  ACTIVITY LIMITATIONS: bending, lifting, carry, locomotion, cleaning, community activity, driving, and or occupation  PERSONAL FACTORS: Arthritis, Osteopenia, Peripheral neuropathy.  are also affecting patient's functional outcome.  REHAB POTENTIAL: Good  CLINICAL DECISION MAKING: Stable/uncomplicated  EVALUATION COMPLEXITY: Low   GOALS: Short term PT Goals Target date:11/29/2022 Pt will be I and compliant with HEP. Baseline:  Goal status: MET  Long term PT goals Target date: 02/13/2023 Pt will improve BERG by at least 3  points in order to demonstrate clinically significant improvement in balance.   Baseline:   Goal status: GOAL MET Pt will improve hip/knee strength to at least 5-/5 MMT to improve functional strength Baseline: Goal status: ONGOING Pt will report <1 fall during her duration in PT.         Baseline         Goal status: GOAL MET Pt will be able to ambulate community distances at least 1000 ft WNL gait pattern without complaints Baseline: Goal status: ONGOING       5. Pt will be able to negotiate stairs with a reciprocal gait pattern       Baseline:        Goal status: ONGOING       6. Pt will improve her STS by 2.3 seconds for Minimal clinically important difference.         Baseline:          Goal status: GOAL MET   PLAN: PT FREQUENCY: 1-2 times per week   PT DURATION: 6 weeks  PLANNED INTERVENTIONS (unless contraindicated): aquatic PT, Canalith repositioning, cryotherapy, Electrical stimulation, Iontophoresis with 4 mg/ml dexamethasome, Moist heat, traction, Ultrasound, gait training, Therapeutic exercise, balance training, neuromuscular re-education, patient/family education, prosthetic training, manual techniques, passive ROM, dry needling, taping, vasopnuematic device, vestibular, spinal manipulations, joint manipulations  PLAN FOR NEXT SESSION:  Cont with LE strengthening, balance, stair negotiation and gait.  Cont to work on step ups.   Selinda Michaels III PT, DPT 01/25/23 2:45 PM

## 2023-01-31 ENCOUNTER — Ambulatory Visit (HOSPITAL_BASED_OUTPATIENT_CLINIC_OR_DEPARTMENT_OTHER): Payer: Medicare Other | Admitting: Physical Therapy

## 2023-01-31 ENCOUNTER — Encounter (HOSPITAL_BASED_OUTPATIENT_CLINIC_OR_DEPARTMENT_OTHER): Payer: Self-pay | Admitting: Physical Therapy

## 2023-01-31 DIAGNOSIS — R2689 Other abnormalities of gait and mobility: Secondary | ICD-10-CM

## 2023-01-31 DIAGNOSIS — R2681 Unsteadiness on feet: Secondary | ICD-10-CM

## 2023-01-31 DIAGNOSIS — M6281 Muscle weakness (generalized): Secondary | ICD-10-CM

## 2023-01-31 NOTE — Therapy (Signed)
OUTPATIENT PHYSICAL THERAPY LOWER EXTREMITY TREATMENT       Patient Name: Hailey Perkins MRN: [id number] DOB:08-23-1935, 88 y.o., female Today's Date: 01/31/2023  END OF SESSION:  PT End of Session - 01/31/23 1028     Visit Number 14    Number of Visits 18    Date for PT Re-Evaluation 02/13/23    Authorization Type [employer] MEDICARE    PT Start Time 1027    PT Stop Time 1110    PT Time Calculation (min) 43 min    Activity Tolerance Patient tolerated treatment well    Behavior During Therapy WFL for tasks assessed/performed                     Past Medical History:  Diagnosis Date   Aortic atherosclerosis ([place])    Arthritis    Cataract 2010   bilateral eyes   Chicken pox    Cholelithiasis    Diverticulitis    Diverticulosis    GERD (gastroesophageal reflux disease)    Glaucoma    Hiatal hernia    History of frequent urinary tract infections    Hyperlipidemia    Osteopenia    Peripheral neuropathy    Sleep apnea    wears a CPAP   Urine incontinence    Past Surgical History:  Procedure Laterality Date   ABDOMINAL HYSTERECTOMY  1977   ANAL FISTULECTOMY  1985   ANKLE FRACTURE SURGERY Right 2014   APPENDECTOMY     BIOPSY  02/15/2021   Procedure: BIOPSY;  Surgeon: Irving Copas., MD;  Location: [facility];  Service: Gastroenterology;;   BREAST BIOPSY Bilateral 10+ yrs ago   BENIGN   BUNIONECTOMY Left 1975   Left Toe   CATARACT EXTRACTION, BILATERAL  2009   COLONOSCOPY WITH PROPOFOL N/A 02/15/2021   Procedure: COLONOSCOPY WITH PROPOFOL;  Surgeon: Irving Copas., MD;  Location: [facility];  Service: Gastroenterology;  Laterality: N/A;  ultraslimscope    DILATION AND CURETTAGE OF UTERUS  1972   eye [place] vision correction Left 10/09/2013   eye socket re-formed Left 2014   FOOT NEUROMA SURGERY Right 2003   plus bunionectomy, right great toe   HAMMER TOE SURGERY Right 2005   Right foot correction hammertoe little toe    HEMORRHOID SURGERY  1967   HIP SURGERY Right 2014   right hip femur pin implanted   KNEE ARTHROSCOPY Left 2010   debridement left knee scar tissue   LAPAROSCOPIC OOPHERECTOMY     1986, [place] Right 2003   Right Foot Neuroma, plus Bunionectomy, Right Great Toe   POLYPECTOMY  02/15/2021   Procedure: POLYPECTOMY;  Surgeon: Irving Copas., MD;  Location: [facility];  Service: Gastroenterology;;   [place] Left 01/2008   REPLACEMENT TOTAL KNEE Right 08/2008   retinal pucker correction Left 07/14/2012   schwannoma tumor (benign) removal  2011   right rib   [place]   Patient Active Problem List   Diagnosis Date Noted   Chronic pain of right knee 05/27/2021   OSA on CPAP 05/27/2021   LLQ pain 12/16/2020   History of diverticulitis 12/16/2020   Hematochezia 12/16/2020   Abnormal colonoscopy 12/16/2020   Diverticulitis of colon 08/15/2020   Sleep-related hypoxia 08/05/2019   OSA (obstructive sleep apnea) 08/05/2019  PLMD (periodic limb movement disorder) 08/05/2019   Cerebellar 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 ([place]) 05/06/2019   Excessive daytime sleepiness 05/06/2019   Loud snoring 05/06/2019   Ventral hernia without obstruction or gangrene 02/22/2019   Chronic throat clearing 01/18/2019   Nocturnal cough 01/18/2019   Family history of colon cancer in father 01/18/2019   Diverticulosis 01/18/2019   Gait abnormality 12/27/2018   Numbness 12/27/2018   Status post total bilateral knee replacement 11/30/2018   DDD (degenerative disc disease), cervical 11/30/2018   Status post carpal tunnel release 11/30/2018   DDD (degenerative disc disease), lumbar 10/26/2018   Primary osteoarthritis of both hands 10/26/2018   Primary osteoarthritis of both feet 10/26/2018   Gastroesophageal reflux disease 08/27/2018   Bilateral  impacted cerumen 08/21/2018   Sensorineural hearing loss (SNHL) of both ears 08/21/2018   Tinnitus, bilateral 08/21/2018   Blood in urine 04/17/2018   Secondary open-angle glaucoma of left eye, moderate stage 03/10/2018   Early stage 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 10/05/2017   Epiretinal membrane (ERM) of left eye 10/05/2017   Pseudophakia of both eyes 10/05/2017   Osteopenia 03/19/2017   Mixed hyperlipidemia 08/18/2016   Generalized osteoarthritis of multiple sites 08/18/2016   Peripheral neuropathic pain 08/18/2016   Overactive bladder 08/18/2016   Glaucoma 08/18/2016   Cervical disc disorder with radiculopathy of cervical region 08/17/2016   Carpal tunnel syndrome, right upper limb 08/17/2016   Abnormal finding on mammography 06/14/2016   Breast lump 05/06/2015    PCP: Donnajean Lopes, MD  REFERRING PROVIDER: Donnajean Lopes, MD   REFERRING DIAG: Unsteadiness on feet [R26.81]   THERAPY DIAG:  Muscle weakness (generalized)  Other abnormalities of gait and mobility  Unsteadiness on feet  Rationale for Evaluation and Treatment: Rehabilitation  ONSET DATE: 2 years ago  SUBJECTIVE:   SUBJECTIVE STATEMENT: Pt reports it is easier to perform sit/stand transfers.  She continues to have some difficulty with the lowest range of toilet transfers though is improving.  Pt denies any adverse effects after from prior Rx.  Pt is performing her HEP.  Pt continues to have difficulty with stairs.      PERTINENT HISTORY: Arthritis, Osteopenia, Peripheral neuropathy, and bilat TKR. PAIN:  Are you having pain? Yes 3/10 L hip pain.  Pt states she woke up with L hip pain today.   She denies any knee pain.   PRECAUTIONS: None  WEIGHT BEARING RESTRICTIONS: No  FALLS:  Has patient fallen in last 6 months? Yes. Number of falls 2  LIVING ENVIRONMENT: Lives with: lives alone Lives in: House/apartment Stairs: No Has following equipment at home:  Single point cane, shower chair, and Grab bars  OCCUPATION: Retired  PLOF: Independent  PATIENT GOALS: Pt would like to improve her strength in bilat LE's and improve her balance.   NEXT MD VISIT:   OBJECTIVE:   DIAGNOSTIC FINDINGS: None at this time.     TODAY'S TREATMENT:  Ther Ex: -Reviewed current function, HEP compliance, response to prior Rx, and pain level.  Pt performed: Nustep L4 x5 mins UE/LE's  Seated toe raises 2x10 Sidestepping with RTB around bilat thighs x 2 laps at rail with SBA/CGA   Therapeutic Activities: Pt performed 1 flight of stairs with a step to gait with rail with SBA/CGA x 2 reps Sit to stands from table 2x10 reps with RTB around thighs  Neuromuscular Re-education: Marching on airex 2x15 with UE support Toe Tapping with occasional UE support with CGA/min to mod assist 2x10 Tandem stance with frequent UE support 3 x 30 sec each with CGA Stepping with reciprocal gait over 4 hurdles with CGA with UE support  Tandem gait with UE support with CGA      PATIENT EDUCATION:  Education details:  POC, exercise form and rationale. Person educated: Patient Education method: Customer service manager, verbal cues Education comprehension: verbalized understanding and returned demonstration, verbal cues required  HOME EXERCISE PROGRAM: Access Code: [email or web address] URL: [email or web address] Date: 11/10/2022 Prepared by: Ronny Flurry  Exercises - Seated March  - 1 x daily - 5-7 x weekly - 2 sets - 10 reps - Seated Toe Raise  - 1 x daily - 7 x weekly - 2 sets - 10 reps - Seated Heel Raise  - 1 x daily - 5-7 x weekly - 2 sets - 10 reps - Long Sitting Ankle Dorsiflexion with Anchored Resistance  - 1 x daily - 3-4 x weekly - 2 sets - 10 reps - Seated Hip Abduction with Resistance  - 1 x daily - 3 x weekly - 2 sets -  10 reps - Romberg Stance  - 1 x daily - 5-7 x weekly - 2-3 reps - 30 seconds hold   - Sit to Stand  - 1 x daily - 7 x weekly - 2 sets - 10 reps - Side Stepping with Counter Support  - 1 x daily - 5-6 x weekly - 2 sets - 10 reps - Seated Long Arc Quad with Ankle Weight  - 1 x daily - 3 x weekly - 3 sets - 10 reps  ASSESSMENT:  CLINICAL IMPRESSION:  Pt reports improved performance of sit/stand transfers including toilet transfers.  PT worked on stairs.  She has functional LE weakness as evidenced by performance of stairs and has greater difficulty with R > L LE with stairs.  She requires UE assistance on rail to perform stairs and performed a step to gait pattern.  Pt gave good effort with all exercises and requires UE support with balance exercises.  She continues to have balance/proprioception deficits though is improving with balance performing more challenging exercises.  She responded well to Rx having no c/o's after Rx.  Pt should benefit from cont skilled PT services to address ongoing goals and to improve balance, strength, and function.        OBJECTIVE IMPAIRMENTS: decreased activity tolerance, difficulty walking, decreased balance, decreased endurance, decreased mobility, decreased ROM, decreased strength, impaired flexibility, impaired UE/LE use, postural dysfunction, and pain.  ACTIVITY LIMITATIONS: bending, lifting, carry, locomotion, cleaning, community activity, driving, and or occupation  PERSONAL FACTORS: Arthritis, Osteopenia, Peripheral neuropathy.  are also affecting patient's functional outcome.  REHAB POTENTIAL: Good  CLINICAL DECISION MAKING: Stable/uncomplicated  EVALUATION COMPLEXITY: Low   GOALS: Short term PT Goals Target date:11/29/2022 Pt will be I and compliant with HEP. Baseline:  Goal status: MET  Long term PT goals Target date: 02/13/2023 Pt will improve BERG by at least  3 points in order to demonstrate clinically significant improvement in balance.    Baseline:  Goal status: GOAL MET Pt will improve hip/knee strength to at least 5-/5 MMT to improve functional strength Baseline: Goal status: ONGOING Pt will report <1 fall during her duration in PT.         Baseline         Goal status: GOAL MET Pt will be able to ambulate community distances at least 1000 ft WNL gait pattern without complaints Baseline: Goal status: ONGOING       5. Pt will be able to negotiate stairs with a reciprocal gait pattern       Baseline:        Goal status: ONGOING       6. Pt will improve her STS by 2.3 seconds for Minimal clinically important difference.         Baseline:          Goal status: GOAL MET   PLAN: PT FREQUENCY: 1-2 times per week   PT DURATION: 6 weeks  PLANNED INTERVENTIONS (unless contraindicated): aquatic PT, Canalith repositioning, cryotherapy, Electrical stimulation, Iontophoresis with 4 mg/ml dexamethasome, Moist heat, traction, Ultrasound, gait training, Therapeutic exercise, balance training, neuromuscular re-education, patient/family education, prosthetic training, manual techniques, passive ROM, dry needling, taping, vasopnuematic device, vestibular, spinal manipulations, joint manipulations  PLAN FOR NEXT SESSION:  Cont with LE strengthening, balance, stair negotiation and gait.  Cont to work on step ups.   Selinda Michaels III PT, DPT 01/31/23 9:00 PM

## 2023-02-02 ENCOUNTER — Ambulatory Visit (HOSPITAL_BASED_OUTPATIENT_CLINIC_OR_DEPARTMENT_OTHER): Payer: Medicare Other | Admitting: Physical Therapy

## 2023-02-02 ENCOUNTER — Encounter (HOSPITAL_BASED_OUTPATIENT_CLINIC_OR_DEPARTMENT_OTHER): Payer: Self-pay | Admitting: Physical Therapy

## 2023-02-02 DIAGNOSIS — R2681 Unsteadiness on feet: Secondary | ICD-10-CM

## 2023-02-02 DIAGNOSIS — M6281 Muscle weakness (generalized): Secondary | ICD-10-CM

## 2023-02-02 DIAGNOSIS — R2689 Other abnormalities of gait and mobility: Secondary | ICD-10-CM

## 2023-02-02 NOTE — Therapy (Signed)
OUTPATIENT PHYSICAL THERAPY LOWER EXTREMITY TREATMENT       Patient Name: Hailey Perkins MRN: [id number] DOB:[birth year], 88 y.o., female Today's Date: 02/02/2023  END OF SESSION:  PT End of Session - 02/02/23 2144     Visit Number 15    Number of Visits 18    Date for PT Re-Evaluation 02/13/23    Authorization Type [employer] MEDICARE    PT Start Time 1609    PT Stop Time [id number]    PT Time Calculation (min) 40 min    Activity Tolerance Patient tolerated treatment well    Behavior During Therapy WFL for tasks assessed/performed                      Past Medical History:  Diagnosis Date   Aortic atherosclerosis ([place])    Arthritis    Cataract 2010   bilateral eyes   Chicken pox    Cholelithiasis    Diverticulitis    Diverticulosis    GERD (gastroesophageal reflux disease)    Glaucoma    Hiatal hernia    History of frequent urinary tract infections    Hyperlipidemia    Osteopenia    Peripheral neuropathy    Sleep apnea    wears a CPAP   Urine incontinence    Past Surgical History:  Procedure Laterality Date   ABDOMINAL HYSTERECTOMY  1977   ANAL FISTULECTOMY  1985   ANKLE FRACTURE SURGERY Right 2014   APPENDECTOMY     BIOPSY  02/15/2021   Procedure: BIOPSY;  Surgeon: Irving Copas., MD;  Location: [facility];  Service: Gastroenterology;;   BREAST BIOPSY Bilateral 10+ yrs ago   BENIGN   BUNIONECTOMY Left 1975   Left Toe   CATARACT EXTRACTION, BILATERAL  2009   COLONOSCOPY WITH PROPOFOL N/A 02/15/2021   Procedure: COLONOSCOPY WITH PROPOFOL;  Surgeon: Irving Copas., MD;  Location: [facility];  Service: Gastroenterology;  Laterality: N/A;  ultraslimscope    DILATION AND CURETTAGE OF UTERUS  1972   eye [place] vision correction Left 10/09/2013   eye socket re-formed Left 2014   FOOT NEUROMA SURGERY Right 2003   plus bunionectomy, right great toe   HAMMER TOE SURGERY Right 2005   Right foot correction hammertoe little toe    HEMORRHOID SURGERY  1967   HIP SURGERY Right 2014   right hip femur pin implanted   KNEE ARTHROSCOPY Left 2010   debridement left knee scar tissue   LAPAROSCOPIC OOPHERECTOMY     1986, [place] Right 2003   Right Foot Neuroma, plus Bunionectomy, Right Great Toe   POLYPECTOMY  02/15/2021   Procedure: POLYPECTOMY;  Surgeon: Irving Copas., MD;  Location: [facility];  Service: Gastroenterology;;   [place] Left 01/2008   REPLACEMENT TOTAL KNEE Right 08/2008   retinal pucker correction Left 07/14/2012   schwannoma tumor (benign) removal  2011   right rib   [place]   Patient Active Problem List   Diagnosis Date Noted   Chronic pain of right knee 05/27/2021   OSA on CPAP 05/27/2021   LLQ pain 12/16/2020   History of diverticulitis 12/16/2020   Hematochezia 12/16/2020   Abnormal colonoscopy 12/16/2020   Diverticulitis of colon 08/15/2020   Sleep-related hypoxia 08/05/2019   OSA (obstructive sleep apnea) 08/05/2019  PLMD (periodic limb movement disorder) 08/05/2019   Cerebellar 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 ([place]) 05/06/2019   Excessive daytime sleepiness 05/06/2019   Loud snoring 05/06/2019   Ventral hernia without obstruction or gangrene 02/22/2019   Chronic throat clearing 01/18/2019   Nocturnal cough 01/18/2019   Family history of colon cancer in father 01/18/2019   Diverticulosis 01/18/2019   Gait abnormality 12/27/2018   Numbness 12/27/2018   Status post total bilateral knee replacement 11/30/2018   DDD (degenerative disc disease), cervical 11/30/2018   Status post carpal tunnel release 11/30/2018   DDD (degenerative disc disease), lumbar 10/26/2018   Primary osteoarthritis of both hands 10/26/2018   Primary osteoarthritis of both feet 10/26/2018   Gastroesophageal reflux disease 08/27/2018   Bilateral  impacted cerumen 08/21/2018   Sensorineural hearing loss (SNHL) of both ears 08/21/2018   Tinnitus, bilateral 08/21/2018   Blood in urine 04/17/2018   Secondary open-angle glaucoma of left eye, moderate stage 03/10/2018   Early stage 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 10/05/2017   Epiretinal membrane (ERM) of left eye 10/05/2017   Pseudophakia of both eyes 10/05/2017   Osteopenia 03/19/2017   Mixed hyperlipidemia 08/18/2016   Generalized osteoarthritis of multiple sites 08/18/2016   Peripheral neuropathic pain 08/18/2016   Overactive bladder 08/18/2016   Glaucoma 08/18/2016   Cervical disc disorder with radiculopathy of cervical region 08/17/2016   Carpal tunnel syndrome, right upper limb 08/17/2016   Abnormal finding on mammography 06/14/2016   Breast lump 05/06/2015    PCP: Donnajean Lopes, MD  REFERRING PROVIDER: Donnajean Lopes, MD   REFERRING DIAG: Unsteadiness on feet [R26.81]   THERAPY DIAG:  Muscle weakness (generalized)  Other abnormalities of gait and mobility  Unsteadiness on feet  Rationale for Evaluation and Treatment: Rehabilitation  ONSET DATE: 2 years ago  SUBJECTIVE:   SUBJECTIVE STATEMENT: Pt reports it is easier to perform sit/stand transfers.  She continues to have some difficulty with the lowest range of toilet transfers though is improving.  Pt denies any adverse effects after from prior Rx.  Pt is performing her HEP.  Pt continues to have difficulty with stairs.      PERTINENT HISTORY: Arthritis, Osteopenia, Peripheral neuropathy, and bilat TKR. PAIN:  Are you having pain? Yes 3/10 L hip pain.  Pt states she woke up with L hip pain today.   She denies any knee pain.   PRECAUTIONS: None  WEIGHT BEARING RESTRICTIONS: No  FALLS:  Has patient fallen in last 6 months? Yes. Number of falls 2  LIVING ENVIRONMENT: Lives with: lives alone Lives in: House/apartment Stairs: No Has following equipment at home:  Single point cane, shower chair, and Grab bars  OCCUPATION: Retired  PLOF: Independent  PATIENT GOALS: Pt would like to improve her strength in bilat LE's and improve her balance.   NEXT MD VISIT:   OBJECTIVE:   DIAGNOSTIC FINDINGS: None at this time.     TODAY'S TREATMENT:  Ther Ex: -Reviewed current function, HEP compliance, response to prior Rx, and pain level.  Pt performed: Nustep L4 x5 mins UE/LE's  Seated toe raises 2x10 Sidestepping with RTB around bilat thighs x 3 laps at rail with SBA/CGA DF with RTB 2x10 reps   Therapeutic Activities: Pt performed 1 flight of stairs with a step to gait with rail with SBA/CGA x 2 reps Sit to stands from table 2x10 reps with RTB around thighs  Neuromuscular Re-education: Marching on airex 3x12 with occasional UE support with min to mod assist  Tandem stance with frequent UE support 2 x 30 sec each with CGA Stepping with reciprocal gait over 4 hurdles with CGA with UE support  Tandem gait with UE support with CGA      PATIENT EDUCATION:  Education details:  POC, exercise form and rationale. Person educated: Patient Education method: Customer service manager, verbal cues Education comprehension: verbalized understanding and returned demonstration, verbal cues required  HOME EXERCISE PROGRAM: Access Code: [email or web address] URL: [email or web address] Date: 11/10/2022 Prepared by: Ronny Flurry  Exercises - Seated March  - 1 x daily - 5-7 x weekly - 2 sets - 10 reps - Seated Toe Raise  - 1 x daily - 7 x weekly - 2 sets - 10 reps - Seated Heel Raise  - 1 x daily - 5-7 x weekly - 2 sets - 10 reps - Long Sitting Ankle Dorsiflexion with Anchored Resistance  - 1 x daily - 3-4 x weekly - 2 sets - 10 reps - Seated Hip Abduction with Resistance  - 1 x daily - 3 x weekly - 2 sets - 10 reps -  Romberg Stance  - 1 x daily - 5-7 x weekly - 2-3 reps - 30 seconds hold   - Sit to Stand  - 1 x daily - 7 x weekly - 2 sets - 10 reps - Side Stepping with Counter Support  - 1 x daily - 5-6 x weekly - 2 sets - 10 reps - Seated Long Arc Quad with Ankle Weight  - 1 x daily - 3 x weekly - 3 sets - 10 reps  ASSESSMENT:  CLINICAL IMPRESSION:  Pt gives good effort with all exercises.  She is improving with performance of sit/stand transfers.  PT worked on stairs.  She continues to have difficulty with stairs and has greater weakness on R LE as evidenced by performance of stairs.  Pt has to perform a step to gait pattern and use the rail with ascending and descending stairs.  She continues to have balance/proprioception deficits and requires UE support with the majority of balance exercises.  Pt tried airex marches without using rail and required increased assistance from PT and occasional usage of rail to correct LOB.  She responded well to Rx having no c/o's after Rx.  Pt should benefit from cont skilled PT services to address ongoing goals and to improve balance, strength, and function.        OBJECTIVE IMPAIRMENTS: decreased activity tolerance, difficulty walking, decreased balance, decreased endurance, decreased mobility, decreased ROM, decreased strength, impaired flexibility, impaired UE/LE use, postural dysfunction, and pain.  ACTIVITY LIMITATIONS: bending, lifting, carry, locomotion, cleaning, community activity, driving, and or occupation  PERSONAL FACTORS: Arthritis, Osteopenia, Peripheral neuropathy.  are also affecting patient's functional outcome.  REHAB POTENTIAL: Good  CLINICAL DECISION MAKING: Stable/uncomplicated  EVALUATION COMPLEXITY: Low   GOALS: Short term PT Goals Target date:11/29/2022 Pt will be I and compliant with HEP. Baseline:  Goal status: MET  Long term PT goals Target date: 02/13/2023 Pt will improve BERG by at least 3 points in order to demonstrate  clinically significant improvement in balance.   Baseline:  Goal status: GOAL MET Pt will improve hip/knee strength to at least 5-/5 MMT to improve functional strength Baseline: Goal status: ONGOING Pt will report <1 fall during her duration in PT.         Baseline         Goal status: GOAL MET Pt will be able to ambulate community distances at least 1000 ft WNL gait pattern without complaints Baseline: Goal status: ONGOING       5. Pt will be able to negotiate stairs with a reciprocal gait pattern       Baseline:        Goal status: ONGOING       6. Pt will improve her STS by 2.3 seconds for Minimal clinically important difference.         Baseline:          Goal status: GOAL MET   PLAN: PT FREQUENCY: 1-2 times per week   PT DURATION: 6 weeks  PLANNED INTERVENTIONS (unless contraindicated): aquatic PT, Canalith repositioning, cryotherapy, Electrical stimulation, Iontophoresis with 4 mg/ml dexamethasome, Moist heat, traction, Ultrasound, gait training, Therapeutic exercise, balance training, neuromuscular re-education, patient/family education, prosthetic training, manual techniques, passive ROM, dry needling, taping, vasopnuematic device, vestibular, spinal manipulations, joint manipulations  PLAN FOR NEXT SESSION:  Cont with LE strengthening, balance, stair negotiation and gait.  Cont to work on step ups.   Selinda Michaels III PT, DPT 02/02/23 9:52 PM

## 2023-02-07 ENCOUNTER — Encounter (HOSPITAL_BASED_OUTPATIENT_CLINIC_OR_DEPARTMENT_OTHER): Payer: Self-pay | Admitting: Physical Therapy

## 2023-02-07 ENCOUNTER — Ambulatory Visit (HOSPITAL_BASED_OUTPATIENT_CLINIC_OR_DEPARTMENT_OTHER): Payer: Medicare Other | Attending: Internal Medicine | Admitting: Physical Therapy

## 2023-02-07 DIAGNOSIS — R2689 Other abnormalities of gait and mobility: Secondary | ICD-10-CM | POA: Diagnosis present

## 2023-02-07 DIAGNOSIS — R2681 Unsteadiness on feet: Secondary | ICD-10-CM | POA: Insufficient documentation

## 2023-02-07 DIAGNOSIS — M6281 Muscle weakness (generalized): Secondary | ICD-10-CM | POA: Diagnosis present

## 2023-02-07 NOTE — Therapy (Signed)
OUTPATIENT PHYSICAL THERAPY LOWER EXTREMITY TREATMENT       Patient Name: Hailey Perkins MRN: [id number] DOB:20-Feb-1935, 88 y.o., female Today's Date: 02/07/2023  END OF SESSION:  PT End of Session - 02/07/23 1009     Visit Number 16    Number of Visits 18    Date for PT Re-Evaluation 02/13/23    Authorization Type [employer] MEDICARE    PT Start Time [id number]    PT Stop Time 1016    PT Time Calculation (min) 41 min    Activity Tolerance Patient tolerated treatment well    Behavior During Therapy WFL for tasks assessed/performed                       Past Medical History:  Diagnosis Date   Aortic atherosclerosis ([place])    Arthritis    Cataract 2010   bilateral eyes   Chicken pox    Cholelithiasis    Diverticulitis    Diverticulosis    GERD (gastroesophageal reflux disease)    Glaucoma    Hiatal hernia    History of frequent urinary tract infections    Hyperlipidemia    Osteopenia    Peripheral neuropathy    Sleep apnea    wears a CPAP   Urine incontinence    Past Surgical History:  Procedure Laterality Date   ABDOMINAL HYSTERECTOMY  1977   ANAL FISTULECTOMY  1985   ANKLE FRACTURE SURGERY Right 2014   APPENDECTOMY     BIOPSY  02/15/2021   Procedure: BIOPSY;  Surgeon: Irving Copas., MD;  Location: [facility];  Service: Gastroenterology;;   BREAST BIOPSY Bilateral 10+ yrs ago   BENIGN   BUNIONECTOMY Left 1975   Left Toe   CATARACT EXTRACTION, BILATERAL  2009   COLONOSCOPY WITH PROPOFOL N/A 02/15/2021   Procedure: COLONOSCOPY WITH PROPOFOL;  Surgeon: Irving Copas., MD;  Location: [facility];  Service: Gastroenterology;  Laterality: N/A;  ultraslimscope    DILATION AND CURETTAGE OF UTERUS  1972   eye [place] vision correction Left 10/09/2013   eye socket re-formed Left 2014   FOOT NEUROMA SURGERY Right 2003   plus bunionectomy, right great toe   HAMMER TOE SURGERY Right 2005   Right foot correction hammertoe little toe    HEMORRHOID SURGERY  1967   HIP SURGERY Right 2014   right hip femur pin implanted   KNEE ARTHROSCOPY Left 2010   debridement left knee scar tissue   LAPAROSCOPIC OOPHERECTOMY     1986, [place] Right 2003   Right Foot Neuroma, plus Bunionectomy, Right Great Toe   POLYPECTOMY  02/15/2021   Procedure: POLYPECTOMY;  Surgeon: Irving Copas., MD;  Location: [facility];  Service: Gastroenterology;;   [place] Left 01/2008   REPLACEMENT TOTAL KNEE Right 08/2008   retinal pucker correction Left 07/14/2012   schwannoma tumor (benign) removal  2011   right rib   [place]   Patient Active Problem List   Diagnosis Date Noted   Chronic pain of right knee 05/27/2021   OSA on CPAP 05/27/2021   LLQ pain 12/16/2020   History of diverticulitis 12/16/2020   Hematochezia 12/16/2020   Abnormal colonoscopy 12/16/2020   Diverticulitis of colon 08/15/2020   Sleep-related hypoxia 08/05/2019   OSA (obstructive sleep apnea) 08/05/2019  PLMD (periodic limb movement disorder) 08/05/2019   Cerebellar 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 ([place]) 05/06/2019   Excessive daytime sleepiness 05/06/2019   Loud snoring 05/06/2019   Ventral hernia without obstruction or gangrene 02/22/2019   Chronic throat clearing 01/18/2019   Nocturnal cough 01/18/2019   Family history of colon cancer in father 01/18/2019   Diverticulosis 01/18/2019   Gait abnormality 12/27/2018   Numbness 12/27/2018   Status post total bilateral knee replacement 11/30/2018   DDD (degenerative disc disease), cervical 11/30/2018   Status post carpal tunnel release 11/30/2018   DDD (degenerative disc disease), lumbar 10/26/2018   Primary osteoarthritis of both hands 10/26/2018   Primary osteoarthritis of both feet 10/26/2018   Gastroesophageal reflux disease 08/27/2018   Bilateral  impacted cerumen 08/21/2018   Sensorineural hearing loss (SNHL) of both ears 08/21/2018   Tinnitus, bilateral 08/21/2018   Blood in urine 04/17/2018   Secondary open-angle glaucoma of left eye, moderate stage 03/10/2018   Early stage 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 10/05/2017   Epiretinal membrane (ERM) of left eye 10/05/2017   Pseudophakia of both eyes 10/05/2017   Osteopenia 03/19/2017   Mixed hyperlipidemia 08/18/2016   Generalized osteoarthritis of multiple sites 08/18/2016   Peripheral neuropathic pain 08/18/2016   Overactive bladder 08/18/2016   Glaucoma 08/18/2016   Cervical disc disorder with radiculopathy of cervical region 08/17/2016   Carpal tunnel syndrome, right upper limb 08/17/2016   Abnormal finding on mammography 06/14/2016   Breast lump 05/06/2015    PCP: Donnajean Lopes, MD  REFERRING PROVIDER: Donnajean Lopes, MD   REFERRING DIAG: Unsteadiness on feet [R26.81]   THERAPY DIAG:  Muscle weakness (generalized)  Other abnormalities of gait and mobility  Unsteadiness on feet  Rationale for Evaluation and Treatment: Rehabilitation  ONSET DATE: 2 years ago  SUBJECTIVE:   SUBJECTIVE STATEMENT: Pt took an aquatic class yesterday at [employer] and states she felt wonderful afterwards.  Pt denies any adverse effects after prior Rx.  She denies pain currently.  Pt states she feels about the same, nothing new.  It is easier to perform sit/stand transfers.  She continues to have some difficulty with the lowest range of toilet transfers though is improving.  Pt is performing her HEP.  Pt continues to have difficulty with stairs.      PERTINENT HISTORY: Arthritis, Osteopenia, Peripheral neuropathy, and bilat TKR. PAIN:  Are you having pain? No She denies any knee or hip pain.   PRECAUTIONS: None  WEIGHT BEARING RESTRICTIONS: No  FALLS:  Has patient fallen in last 6 months? Yes. Number of falls 2  LIVING ENVIRONMENT: Lives  with: lives alone Lives in: House/apartment Stairs: No Has following equipment at home: Single point cane, shower chair, and Grab bars  OCCUPATION: Retired  PLOF: Independent  PATIENT GOALS: Pt would like to improve her strength in bilat LE's and improve her balance.   NEXT MD VISIT:   OBJECTIVE:   DIAGNOSTIC FINDINGS: None at this time.     TODAY'S TREATMENT:  Ther Ex: -Reviewed current function, HEP compliance, response to prior Rx, and pain level.  Pt performed: Nustep L4 x5 mins UE/LE's  Sidestepping with RTB around bilat thighs x 3 laps at rail with SBA/CGA DF with RTB 3x10 reps   Therapeutic Activities: Pt performed 1 flight of stairs with a step to gait with rail with SBA/CGA x 2 reps Sit to stands from table 2x10 reps with GTB around thighs  Neuromuscular Re-education: Fwd and lateral stepping on airex with SBA x 10 reps each bialt Stepping with step to and reciprocal gait over 4 hurdles with CGA with UE support  Tandem gait with UE support with CGA      PATIENT EDUCATION:  Education details:  POC, exercise form and rationale. Person educated: Patient Education method: Customer service manager, verbal cues Education comprehension: verbalized understanding and returned demonstration, verbal cues required  HOME EXERCISE PROGRAM: Access Code: [email or web address] URL: [email or web address] Date: 11/10/2022 Prepared by: Ronny Flurry  Exercises - Seated March  - 1 x daily - 5-7 x weekly - 2 sets - 10 reps - Seated Toe Raise  - 1 x daily - 7 x weekly - 2 sets - 10 reps - Seated Heel Raise  - 1 x daily - 5-7 x weekly - 2 sets - 10 reps - Long Sitting Ankle Dorsiflexion with Anchored Resistance  - 1 x daily - 3-4 x weekly - 2 sets - 10 reps - Seated Hip Abduction with Resistance  - 1 x daily - 3 x weekly - 2 sets - 10  reps - Romberg Stance  - 1 x daily - 5-7 x weekly - 2-3 reps - 30 seconds hold   - Sit to Stand  - 1 x daily - 7 x weekly - 2 sets - 10 reps - Side Stepping with Counter Support  - 1 x daily - 5-6 x weekly - 2 sets - 10 reps - Seated Long Arc Quad with Ankle Weight  - 1 x daily - 3 x weekly - 3 sets - 10 reps  ASSESSMENT:  CLINICAL IMPRESSION:  Pt gives good effort with all exercises.  She continues to have difficulty with stairs and has greater weakness on R LE as evidenced by performance of stairs.  Pt is able to perform a step to gait pattern well while using the rail with ascending and descending stairs.  Pt states she feels about the same as prior Rx, but did report improved sit to stand transfer from the Nustep.  Pt did well with stepping over hurdles with UE support on rail.  Pt continues to be challenged with tandem gait.  She responded well to Rx having no c/o's after Rx.  Pt should benefit from cont skilled PT services to address ongoing goals and to improve balance, strength, and function.      .   OBJECTIVE IMPAIRMENTS: decreased activity tolerance, difficulty walking, decreased balance, decreased endurance, decreased mobility, decreased ROM, decreased strength, impaired flexibility, impaired UE/LE use, postural dysfunction, and pain.  ACTIVITY LIMITATIONS: bending, lifting, carry, locomotion, cleaning, community activity, driving, and or occupation  PERSONAL FACTORS: Arthritis, Osteopenia, Peripheral neuropathy.  are also affecting patient's functional outcome.  REHAB POTENTIAL: Good  CLINICAL DECISION MAKING: Stable/uncomplicated  EVALUATION COMPLEXITY: Low   GOALS: Short term PT Goals Target date:11/29/2022 Pt will be I and compliant with HEP. Baseline:  Goal status: MET  Long term PT goals Target date: 02/13/2023 Pt will improve BERG by at least 3 points in order to demonstrate clinically  significant improvement in balance.   Baseline:  Goal status: GOAL MET Pt  will improve hip/knee strength to at least 5-/5 MMT to improve functional strength Baseline: Goal status: ONGOING Pt will report <1 fall during her duration in PT.         Baseline         Goal status: GOAL MET Pt will be able to ambulate community distances at least 1000 ft WNL gait pattern without complaints Baseline: Goal status: ONGOING       5. Pt will be able to negotiate stairs with a reciprocal gait pattern       Baseline:        Goal status: ONGOING       6. Pt will improve her STS by 2.3 seconds for Minimal clinically important difference.         Baseline:          Goal status: GOAL MET   PLAN: PT FREQUENCY: 1-2 times per week   PT DURATION: 6 weeks  PLANNED INTERVENTIONS (unless contraindicated): aquatic PT, Canalith repositioning, cryotherapy, Electrical stimulation, Iontophoresis with 4 mg/ml dexamethasome, Moist heat, traction, Ultrasound, gait training, Therapeutic exercise, balance training, neuromuscular re-education, patient/family education, prosthetic training, manual techniques, passive ROM, dry needling, taping, vasopnuematic device, vestibular, spinal manipulations, joint manipulations  PLAN FOR NEXT SESSION:  Cont with LE strengthening, balance, stair negotiation and gait.  Cont to work on step ups.   Selinda Michaels III PT, DPT 02/07/23 5:51 PM

## 2023-02-09 ENCOUNTER — Ambulatory Visit (HOSPITAL_BASED_OUTPATIENT_CLINIC_OR_DEPARTMENT_OTHER): Payer: Medicare Other | Admitting: Physical Therapy

## 2023-02-09 ENCOUNTER — Encounter (HOSPITAL_BASED_OUTPATIENT_CLINIC_OR_DEPARTMENT_OTHER): Payer: Self-pay | Admitting: Physical Therapy

## 2023-02-09 DIAGNOSIS — R2689 Other abnormalities of gait and mobility: Secondary | ICD-10-CM

## 2023-02-09 DIAGNOSIS — M6281 Muscle weakness (generalized): Secondary | ICD-10-CM | POA: Diagnosis not present

## 2023-02-09 DIAGNOSIS — R2681 Unsteadiness on feet: Secondary | ICD-10-CM

## 2023-02-09 NOTE — Therapy (Signed)
OUTPATIENT PHYSICAL THERAPY LOWER EXTREMITY TREATMENT       Patient Name: Hailey Perkins MRN: [id number] DOB:[birth year], 88 y.o., female Today's Date: 02/09/2023  END OF SESSION:  PT End of Session - 02/09/23 1021     Visit Number 17    Number of Visits 18    Date for PT Re-Evaluation 02/13/23    Authorization Type [employer] MEDICARE    PT Start Time [id number]    PT Stop Time 1016    PT Time Calculation (min) 45 min    Activity Tolerance Patient tolerated treatment well    Behavior During Therapy WFL for tasks assessed/performed                        Past Medical History:  Diagnosis Date   Aortic atherosclerosis ([place])    Arthritis    Cataract 2010   bilateral eyes   Chicken pox    Cholelithiasis    Diverticulitis    Diverticulosis    GERD (gastroesophageal reflux disease)    Glaucoma    Hiatal hernia    History of frequent urinary tract infections    Hyperlipidemia    Osteopenia    Peripheral neuropathy    Sleep apnea    wears a CPAP   Urine incontinence    Past Surgical History:  Procedure Laterality Date   ABDOMINAL HYSTERECTOMY  1977   ANAL FISTULECTOMY  1985   ANKLE FRACTURE SURGERY Right 2014   APPENDECTOMY     BIOPSY  02/15/2021   Procedure: BIOPSY;  Surgeon: Irving Copas., MD;  Location: [facility];  Service: Gastroenterology;;   BREAST BIOPSY Bilateral 10+ yrs ago   BENIGN   BUNIONECTOMY Left 1975   Left Toe   CATARACT EXTRACTION, BILATERAL  2009   COLONOSCOPY WITH PROPOFOL N/A 02/15/2021   Procedure: COLONOSCOPY WITH PROPOFOL;  Surgeon: Irving Copas., MD;  Location: [facility];  Service: Gastroenterology;  Laterality: N/A;  ultraslimscope    DILATION AND CURETTAGE OF UTERUS  1972   eye [place] vision correction Left 10/09/2013   eye socket re-formed Left 2014   FOOT NEUROMA SURGERY Right 2003   plus bunionectomy, right great toe   HAMMER TOE SURGERY Right 2005   Right foot correction hammertoe little toe    HEMORRHOID SURGERY  1967   HIP SURGERY Right 2014   right hip femur pin implanted   KNEE ARTHROSCOPY Left 2010   debridement left knee scar tissue   LAPAROSCOPIC OOPHERECTOMY     1986, [place] Right 2003   Right Foot Neuroma, plus Bunionectomy, Right Great Toe   POLYPECTOMY  02/15/2021   Procedure: POLYPECTOMY;  Surgeon: Mansouraty, Telford Nab., MD;  Location: [facility];  Service: Gastroenterology;;   [place] Left 01/2008   REPLACEMENT TOTAL KNEE Right 08/2008   retinal pucker correction Left 07/14/2012   schwannoma tumor (benign) removal  2011   right rib   [place]   Patient Active Problem List   Diagnosis Date Noted   Chronic pain of right knee 05/27/2021   OSA on CPAP 05/27/2021   LLQ pain 12/16/2020   History of diverticulitis 12/16/2020   Hematochezia 12/16/2020   Abnormal colonoscopy 12/16/2020   Diverticulitis of colon 08/15/2020   Sleep-related hypoxia 08/05/2019   OSA (obstructive sleep apnea)  08/05/2019   PLMD (periodic limb movement disorder) 08/05/2019   Cerebellar 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 ([place]) 05/06/2019   Excessive daytime sleepiness 05/06/2019   Loud snoring 05/06/2019   Ventral hernia without obstruction or gangrene 02/22/2019   Chronic throat clearing 01/18/2019   Nocturnal cough 01/18/2019   Family history of colon cancer in father 01/18/2019   Diverticulosis 01/18/2019   Gait abnormality 12/27/2018   Numbness 12/27/2018   Status post total bilateral knee replacement 11/30/2018   DDD (degenerative disc disease), cervical 11/30/2018   Status post carpal tunnel release 11/30/2018   DDD (degenerative disc disease), lumbar 10/26/2018   Primary osteoarthritis of both hands 10/26/2018   Primary osteoarthritis of both feet 10/26/2018   Gastroesophageal reflux disease 08/27/2018   Bilateral  impacted cerumen 08/21/2018   Sensorineural hearing loss (SNHL) of both ears 08/21/2018   Tinnitus, bilateral 08/21/2018   Blood in urine 04/17/2018   Secondary open-angle glaucoma of left eye, moderate stage 03/10/2018   Early stage 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 10/05/2017   Epiretinal membrane (ERM) of left eye 10/05/2017   Pseudophakia of both eyes 10/05/2017   Osteopenia 03/19/2017   Mixed hyperlipidemia 08/18/2016   Generalized osteoarthritis of multiple sites 08/18/2016   Peripheral neuropathic pain 08/18/2016   Overactive bladder 08/18/2016   Glaucoma 08/18/2016   Cervical disc disorder with radiculopathy of cervical region 08/17/2016   Carpal tunnel syndrome, right upper limb 08/17/2016   Abnormal finding on mammography 06/14/2016   Breast lump 05/06/2015    PCP: Donnajean Lopes, MD  REFERRING PROVIDER: Donnajean Lopes, MD   REFERRING DIAG: Unsteadiness on feet [R26.81]   THERAPY DIAG:  Muscle weakness (generalized)  Other abnormalities of gait and mobility  Unsteadiness on feet  Rationale for Evaluation and Treatment: Rehabilitation  ONSET DATE: 2 years ago  SUBJECTIVE:   SUBJECTIVE STATEMENT: Pt states he has arthritis in her back that she felt this AM.  Her back feels better when she moves around.  Her back is not bothering her now.  Pt states she loses her balance but doesn't fall when her granddaughter hugs her.  Pt denies any adverse effects after prior Rx, just tired.  She denies pain currently.  Pt is performing her HEP.     PERTINENT HISTORY: Arthritis, Osteopenia, Peripheral neuropathy, and bilat TKR. PAIN:  Are you having pain? No She denies any knee or hip pain.   PRECAUTIONS: None  WEIGHT BEARING RESTRICTIONS: No  FALLS:  Has patient fallen in last 6 months? Yes. Number of falls 2  LIVING ENVIRONMENT: Lives with: lives alone Lives in: House/apartment Stairs: No Has following equipment at home: Single  point cane, shower chair, and Grab bars  OCCUPATION: Retired  PLOF: Independent  PATIENT GOALS: Pt would like to improve her strength in bilat LE's and improve her balance.   NEXT MD VISIT:   OBJECTIVE:   DIAGNOSTIC FINDINGS: None at this time.     TODAY'S TREATMENT:  Ther Ex: Pt performed: Nustep L4 x5 mins UE/LE's     Therapeutic Activities: Pt ascended stairs with a reciprocal gait with CGA with rail and descended stairs with a step to and step thru gait with rail with CGA x 2 reps on stairwell. Lateral step ups on 4 inch and 6 inch step x 10 reps each bilat with UE support on rail   Neuromuscular Re-education: Tandem stance 2x30 sec with frequent UE support  Standing in staggered stance x 30 sec eac with occasional UE support  Standing on airex with FA x 30 sec and FT 2x30 sec with occasional UE support Airex marches with UE support 2x10   PT reviewed and updated HEP.  Pt received a HEP handout and was educated in correct form and appropriate frequency.  Pt was instructed to perform balance exercises in front of a counter or sturdy surface and use hands for support and balance.        PATIENT EDUCATION:  Education details:  POC, exercise form and rationale. Person educated: Patient Education method: Customer service manager, verbal cues Education comprehension: verbalized understanding and returned demonstration, verbal cues required  HOME EXERCISE PROGRAM: Access Code: [email or web address] URL: [email or web address] Date: 11/10/2022 Prepared by: Ronny Flurry  Updated HEP: - Standing in a Staggered Stance  - 1 x daily - 7 x weekly - 2 reps - 20-30 seconds hold - Standing March with Counter Support  - 1 x daily - 5 x weekly - 2 sets - 10 reps  ASSESSMENT:  CLINICAL IMPRESSION:  Pt demonstrates improved performance of  stairs.  She was able to ascend stairs with a reciprocal gait with the rail today.  She was also able to perform a step thru gait at times with descending stairs with the usage of the rail.  PT spent time progressing her balance exercises for her home program.  Pt states she has an airex pad at home.  PT updated and progressed HEP with balance exercises and instructed pt in correct form and to perform exercises in front of a counter or sturdy support for balance and safety.  Pt demonstrates good form with exercises and good understanding of exercises.  She performed exercises well.  She responded well to Rx having no c/o's after Rx.  .   OBJECTIVE IMPAIRMENTS: decreased activity tolerance, difficulty walking, decreased balance, decreased endurance, decreased mobility, decreased ROM, decreased strength, impaired flexibility, impaired UE/LE use, postural dysfunction, and pain.  ACTIVITY LIMITATIONS: bending, lifting, carry, locomotion, cleaning, community activity, driving, and or occupation  PERSONAL FACTORS: Arthritis, Osteopenia, Peripheral neuropathy.  are also affecting patient's functional outcome.  REHAB POTENTIAL: Good  CLINICAL DECISION MAKING: Stable/uncomplicated  EVALUATION COMPLEXITY: Low   GOALS: Short term PT Goals Target date:11/29/2022 Pt will be I and compliant with HEP. Baseline:  Goal status: MET  Long term PT goals Target date: 02/13/2023 Pt will improve BERG by at least 3 points in order to demonstrate clinically significant improvement in balance.   Baseline:  Goal status: GOAL MET Pt will improve hip/knee strength to at least 5-/5 MMT to improve functional strength Baseline: Goal status: ONGOING Pt will report <1 fall during her duration in PT.         Baseline         Goal status: GOAL MET Pt will be able to ambulate community distances at least 1000 ft WNL gait pattern without complaints Baseline: Goal status: ONGOING       5. Pt will be able to negotiate  stairs with a reciprocal gait pattern       Baseline:        Goal status: ONGOING       6. Pt will improve her STS by 2.3 seconds for Minimal clinically important difference.         Baseline:          Goal status: GOAL MET   PLAN: PT FREQUENCY: 1-2 times per week   PT DURATION: 6 weeks  PLANNED INTERVENTIONS (unless contraindicated): aquatic PT, Canalith repositioning, cryotherapy, Electrical stimulation, Iontophoresis with 4 mg/ml dexamethasome, Moist heat, traction, Ultrasound, gait training, Therapeutic exercise, balance training, neuromuscular re-education, patient/family education, prosthetic training, manual techniques, passive ROM, dry needling, taping, vasopnuematic device, vestibular, spinal manipulations, joint manipulations  PLAN FOR NEXT SESSION:  Cont with LE strengthening, balance, stair negotiation and gait.  Cont to work on step ups.   Selinda Michaels III PT, DPT 02/09/23 10:15 PM

## 2023-02-14 ENCOUNTER — Encounter (HOSPITAL_BASED_OUTPATIENT_CLINIC_OR_DEPARTMENT_OTHER): Payer: Self-pay | Admitting: Physical Therapy

## 2023-02-14 ENCOUNTER — Ambulatory Visit (HOSPITAL_BASED_OUTPATIENT_CLINIC_OR_DEPARTMENT_OTHER): Payer: Medicare Other | Admitting: Physical Therapy

## 2023-02-14 DIAGNOSIS — R2681 Unsteadiness on feet: Secondary | ICD-10-CM

## 2023-02-14 DIAGNOSIS — M6281 Muscle weakness (generalized): Secondary | ICD-10-CM | POA: Diagnosis not present

## 2023-02-14 DIAGNOSIS — R2689 Other abnormalities of gait and mobility: Secondary | ICD-10-CM

## 2023-02-14 NOTE — Therapy (Signed)
OUTPATIENT PHYSICAL THERAPY LOWER EXTREMITY TREATMENT       Patient Name: Hailey Perkins MRN: [id number] DOB:September 04, 1935, 88 y.o., female Today's Date: 02/15/2023  END OF SESSION:  PT End of Session - 02/14/23 1412     Visit Number 18    Number of Visits 28    Date for PT Re-Evaluation 03/28/23    Authorization Type [employer] MEDICARE    PT Start Time 1319    PT Stop Time 1407    PT Time Calculation (min) 48 min    Activity Tolerance Patient tolerated treatment well    Behavior During Therapy WFL for tasks assessed/performed                 Past Medical History:  Diagnosis Date   Aortic atherosclerosis ([place])    Arthritis    Cataract 2010   bilateral eyes   Chicken pox    Cholelithiasis    Diverticulitis    Diverticulosis    GERD (gastroesophageal reflux disease)    Glaucoma    Hiatal hernia    History of frequent urinary tract infections    Hyperlipidemia    Osteopenia    Peripheral neuropathy    Sleep apnea    wears a CPAP   Urine incontinence    Past Surgical History:  Procedure Laterality Date   ABDOMINAL HYSTERECTOMY  1977   ANAL FISTULECTOMY  1985   ANKLE FRACTURE SURGERY Right 2014   APPENDECTOMY     BIOPSY  02/15/2021   Procedure: BIOPSY;  Surgeon: Irving Copas., MD;  Location: [facility];  Service: Gastroenterology;;   BREAST BIOPSY Bilateral 10+ yrs ago   BENIGN   BUNIONECTOMY Left 1975   Left Toe   CATARACT EXTRACTION, BILATERAL  2009   COLONOSCOPY WITH PROPOFOL N/A 02/15/2021   Procedure: COLONOSCOPY WITH PROPOFOL;  Surgeon: Irving Copas., MD;  Location: [facility];  Service: Gastroenterology;  Laterality: N/A;  ultraslimscope    DILATION AND CURETTAGE OF UTERUS  1972   eye [place] vision correction Left 10/09/2013   eye socket re-formed Left 2014   FOOT NEUROMA SURGERY Right 2003   plus bunionectomy, right great toe   HAMMER TOE SURGERY Right 2005   Right foot correction hammertoe little toe    HEMORRHOID SURGERY  1967   HIP SURGERY Right 2014   right hip femur pin implanted   KNEE ARTHROSCOPY Left 2010   debridement left knee scar tissue   LAPAROSCOPIC OOPHERECTOMY     1986, [place] Right 2003   Right Foot Neuroma, plus Bunionectomy, Right Great Toe   POLYPECTOMY  02/15/2021   Procedure: POLYPECTOMY;  Surgeon: Irving Copas., MD;  Location: [facility];  Service: Gastroenterology;;   [place] Left 01/2008   REPLACEMENT TOTAL KNEE Right 08/2008   retinal pucker correction Left 07/14/2012   schwannoma tumor (benign) removal  2011   right rib   [place]   Patient Active Problem List   Diagnosis Date Noted   Chronic pain of right knee 05/27/2021   OSA on CPAP 05/27/2021   LLQ pain 12/16/2020   History of diverticulitis 12/16/2020   Hematochezia 12/16/2020   Abnormal colonoscopy 12/16/2020   Diverticulitis of colon 08/15/2020   Sleep-related hypoxia 08/05/2019   OSA (obstructive sleep apnea) 08/05/2019   PLMD (periodic limb movement  disorder) 08/05/2019   Cerebellar 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 ([place]) 05/06/2019   Excessive daytime sleepiness 05/06/2019   Loud snoring 05/06/2019   Ventral hernia without obstruction or gangrene 02/22/2019   Chronic throat clearing 01/18/2019   Nocturnal cough 01/18/2019   Family history of colon cancer in father 01/18/2019   Diverticulosis 01/18/2019   Gait abnormality 12/27/2018   Numbness 12/27/2018   Status post total bilateral knee replacement 11/30/2018   DDD (degenerative disc disease), cervical 11/30/2018   Status post carpal tunnel release 11/30/2018   DDD (degenerative disc disease), lumbar 10/26/2018   Primary osteoarthritis of both hands 10/26/2018   Primary osteoarthritis of both feet 10/26/2018   Gastroesophageal reflux disease 08/27/2018   Bilateral  impacted cerumen 08/21/2018   Sensorineural hearing loss (SNHL) of both ears 08/21/2018   Tinnitus, bilateral 08/21/2018   Blood in urine 04/17/2018   Secondary open-angle glaucoma of left eye, moderate stage 03/10/2018   Early stage 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 10/05/2017   Epiretinal membrane (ERM) of left eye 10/05/2017   Pseudophakia of both eyes 10/05/2017   Osteopenia 03/19/2017   Mixed hyperlipidemia 08/18/2016   Generalized osteoarthritis of multiple sites 08/18/2016   Peripheral neuropathic pain 08/18/2016   Overactive bladder 08/18/2016   Glaucoma 08/18/2016   Cervical disc disorder with radiculopathy of cervical region 08/17/2016   Carpal tunnel syndrome, right upper limb 08/17/2016   Abnormal finding on mammography 06/14/2016   Breast lump 05/06/2015    PCP: Donnajean Lopes, MD  REFERRING PROVIDER: Donnajean Lopes, MD   REFERRING DIAG: Unsteadiness on feet [R26.81]   THERAPY DIAG:  Muscle weakness (generalized)  Other abnormalities of gait and mobility  Unsteadiness on feet  Rationale for Evaluation and Treatment: Rehabilitation  ONSET DATE: 2 years ago  SUBJECTIVE:   SUBJECTIVE STATEMENT: Pt denies any adverse effects after prior Rx.  Pt reports she has been doing her home balance exercises and they are challenging.  Pt denies pain currently.   FUNCTIONAL IMPROVEMENTS:  Pt reports improved performance of sit to stand transfers including toilet transfers.  Pt reports she is more "automatic" with transfers.  Pt is more confident in getting out of her recliner. Pt has not had any falls.  Balance FUNCTIONAL LIMITATIONS:  Pt states her gait has not improved.  Stairs.  Balance.  "I'm still at risk for falling".  Pt is unable to turn her head and talk to someone without losing her balance.  Pt lost her balance when trying to go down a step and turn to talk.  Pt braced against a door to not fall.          PERTINENT  HISTORY: Arthritis, Osteopenia, Peripheral neuropathy, and bilat TKR. PAIN:  Are you having pain? No She denies any knee or hip pain.   PRECAUTIONS: None  WEIGHT BEARING RESTRICTIONS: No  FALLS:  Has patient fallen in last 6 months? Yes. Number of falls 2  LIVING ENVIRONMENT: Lives with: lives alone Lives in: House/apartment Stairs: No Has following equipment at home: Single point cane, shower chair, and Grab bars  OCCUPATION: Retired  PLOF: Independent  PATIENT GOALS: Pt would like to improve her strength in bilat LE's and improve her balance.   NEXT MD VISIT:   OBJECTIVE:   DIAGNOSTIC FINDINGS: None at this time.     TODAY'S TREATMENT:  PATIENT SURVEYS:  ABC scale:  Initial/Prior/Current:  50.6 % / 66.25% / 60%   5 times sit to stand test: Prior:  19 sec without UE support / 14 sec with UE support  Current:  15 sec without UE support   LOWER EXTREMITY MMT:   MMT Right eval Left eval Right 1/29 Left 1/29 Right 3/12 Left 3/12  Hip flexion 4- 4- 4-/5 4/5 4/5 4+/5  Knee flexion 4+ 4+ 4+/5 4+/5 4+/5 5/5  Knee extension 5 5 5/5 5/5 5/5 5/5  Ankle dorsiflexion 4- 4- 4-/5 4-/5 4-/5 4/5  Ankle plantarflexion             (Blank rows = not tested)         Gait:  increased foot slap bilat with decreased foot clearance bilat.      Ther Ex: Pt performed: Nustep L4 x5 mins UE/LE's  Lateral band walks with RTB x 3 laps at rail    Therapeutic Activities: Stairs:  Pt ascended stairs with a reciprocal gait with rail with SBA and descended stairs with a step thru gait with rail with CGA/SBA. Lateral step ups on 4 inch and 6 inch step x 10 reps each bilat with UE support on rail   Neuromuscular Re-education: Tandem stance 2x30 sec with frequent UE support  Standing in staggered stance x 30 sec eac with occasional UE support   BERG  BALANCE TEST Sitting to Standing: 4.      Stands independently using hands Standing Unsupported: 4.      Stands safely for 2 minutes Sitting Unsupported: 4.     Sits for 2 minutes independently Standing to Sitting: 4.      sits safely with minimal use of hands Transfers: 4.     Transfers safely definite use of hands Standing with eyes closed: 4.     Stands safely for 10 seconds  Standing with feet together: 4.     Stands for 1 minute safely Reaching forward with outstretched arm: 4.     Reaches forward 10 inches Retrieving object from the floor: 3.     Able to pick up with supervision Turning to look behind: 4.    looks behind from both sides and weight shifts well Turning 360 degrees: 2.     Able to turn slowly, but safely Place alternate foot on stool: 1.     4 steps without assistance/supervision Standing with one foot in front: 2.     Loses balance while standing/stepping Standing on one foot: 0.     Unable  Berg Balance Test score:  44/56     PATIENT EDUCATION:  Education details:  POC, exercise form, objective findings, and rationale. Person educated: Patient Education method: Customer service manager, verbal cues Education comprehension: verbalized understanding and returned demonstration, verbal cues required  HOME EXERCISE PROGRAM: Access Code: [email or web address] URL: [email or web address] Date: 11/10/2022 Prepared by: Ronny Flurry  Updated HEP: - Standing in a Staggered Stance  - 1 x daily - 7 x weekly - 2 reps - 20-30 seconds hold - Standing March with Counter Support  - 1 x daily - 5 x weekly - 2 sets - 10 reps  ASSESSMENT:  CLINICAL IMPRESSION:  Pt reports improved balance and performance of transfers.  She has had no falls though continues to report balance deficits.  Pt reports no improvement in gait and continues to have increased foot slap bilat with decreased foot clearance.  Pt continues to have difficulty with stairs though is making  improvement.  She was able to ascend stairs with a reciprocal gait with the rail and perform a step thru gait with descending stairs with the usage of the rail and CGA/SBA.  Pt demonstrates improved strength t/o L LE and no change in R LE.  Pt continues to have balance deficits though demonstrates improved balance with Berg Balance Test improving from 41/56 prior to 44/56 currently.  She responded well to Rx having no c/o's after Rx.      .   OBJECTIVE IMPAIRMENTS: decreased activity tolerance, difficulty walking, decreased balance, decreased endurance, decreased mobility, decreased ROM, decreased strength, impaired flexibility, impaired UE/LE use, postural dysfunction, and pain.  ACTIVITY LIMITATIONS: bending, lifting, carry, locomotion, cleaning, community activity, driving, and or occupation  PERSONAL FACTORS: Arthritis, Osteopenia, Peripheral neuropathy.  are also affecting patient's functional outcome.  REHAB POTENTIAL: Good  CLINICAL DECISION MAKING: Stable/uncomplicated  EVALUATION COMPLEXITY: Low   GOALS: Short term PT Goals Target date:11/29/2022 Pt will be I and compliant with HEP. Baseline:  Goal status: MET  Long term PT goals Target date: 02/13/2023 Pt will improve BERG by at least 3 points in order to demonstrate clinically significant improvement in balance.   Baseline:  Goal status: GOAL MET Pt will improve hip/knee strength to at least 5-/5 MMT to improve functional strength Baseline: Goal status: ONGOING Target dat:  03/28/23 Pt will report <1 fall during her duration in PT.         Baseline         Goal status: GOAL MET Pt will be able to ambulate community distances at least 1000 ft WNL gait pattern without complaints Baseline: Goal status: ONGOING Target dat:  03/28/23       5. Pt will be able to negotiate stairs with a reciprocal gait pattern       Baseline:        Goal status: PARTIALLY MET Target dat:  03/28/23       6. Pt will improve her STS by 2.3 seconds for  Minimal clinically important difference.         Baseline:          Goal status: GOAL MET      7.  Pt will be independent with advanced HEP to maximize functional strength gains and balance for improved mobility and decreased fall risk.     Goal status:  INITIAL Target dat:  03/28/23   PLAN: PT FREQUENCY: 1-2 times per week   PT DURATION: 4-6 weeks  PLANNED INTERVENTIONS (unless contraindicated): aquatic PT, Canalith repositioning, cryotherapy, Electrical stimulation, Iontophoresis with 4 mg/ml dexamethasome, Moist heat, traction, Ultrasound, gait training, Therapeutic exercise, balance training, neuromuscular re-education, patient/family education, prosthetic training, manual techniques, passive ROM, dry needling, taping, vasopnuematic device, vestibular, spinal manipulations, joint manipulations  PLAN FOR NEXT SESSION:  Cont with LE strengthening, balance, stair negotiation and gait.  Cont to work on step ups.   Selinda Michaels III PT, DPT 02/15/23 9:43 PM

## 2023-02-16 ENCOUNTER — Encounter (HOSPITAL_BASED_OUTPATIENT_CLINIC_OR_DEPARTMENT_OTHER): Payer: Self-pay | Admitting: Physical Therapy

## 2023-02-16 ENCOUNTER — Ambulatory Visit (HOSPITAL_BASED_OUTPATIENT_CLINIC_OR_DEPARTMENT_OTHER): Payer: Medicare Other | Admitting: Physical Therapy

## 2023-02-16 DIAGNOSIS — R2689 Other abnormalities of gait and mobility: Secondary | ICD-10-CM

## 2023-02-16 DIAGNOSIS — M6281 Muscle weakness (generalized): Secondary | ICD-10-CM

## 2023-02-16 DIAGNOSIS — R2681 Unsteadiness on feet: Secondary | ICD-10-CM

## 2023-02-16 NOTE — Therapy (Signed)
OUTPATIENT PHYSICAL THERAPY LOWER EXTREMITY TREATMENT       Patient Name: Hailey Perkins MRN: [id number] DOB:20-Jan-1935, 88 y.o., female Today's Date: 02/17/2023  END OF SESSION:  PT End of Session - 02/16/23 0947     Visit Number 19    Number of Visits 28    Date for PT Re-Evaluation 03/28/23    Authorization Type [employer] MEDICARE    PT Start Time [phone]    PT Stop Time 1017    PT Time Calculation (min) 40 min    Activity Tolerance Patient tolerated treatment well    Behavior During Therapy [facility] for tasks assessed/performed                 Past Medical History:  Diagnosis Date   Aortic atherosclerosis ([place])    Arthritis    Cataract 2010   bilateral eyes   Chicken pox    Cholelithiasis    Diverticulitis    Diverticulosis    GERD (gastroesophageal reflux disease)    Glaucoma    Hiatal hernia    History of frequent urinary tract infections    Hyperlipidemia    Osteopenia    Peripheral neuropathy    Sleep apnea    wears a CPAP   Urine incontinence    Past Surgical History:  Procedure Laterality Date   ABDOMINAL HYSTERECTOMY  1977   ANAL FISTULECTOMY  1985   ANKLE FRACTURE SURGERY Right 2014   APPENDECTOMY     BIOPSY  02/15/2021   Procedure: BIOPSY;  Surgeon: Irving Copas., MD;  Location: [facility];  Service: Gastroenterology;;   BREAST BIOPSY Bilateral 10+ yrs ago   BENIGN   BUNIONECTOMY Left 1975   Left Toe   CATARACT EXTRACTION, BILATERAL  2009   COLONOSCOPY WITH PROPOFOL N/A 02/15/2021   Procedure: COLONOSCOPY WITH PROPOFOL;  Surgeon: Irving Copas., MD;  Location: [facility];  Service: Gastroenterology;  Laterality: N/A;  ultraslimscope    DILATION AND CURETTAGE OF UTERUS  1972   eye [place] vision correction Left 10/09/2013   eye socket re-formed Left 2014   FOOT NEUROMA SURGERY Right 2003   plus bunionectomy, right great toe   HAMMER TOE SURGERY Right 2005   Right foot correction hammertoe little toe    HEMORRHOID SURGERY  1967   HIP SURGERY Right 2014   right hip femur pin implanted   KNEE ARTHROSCOPY Left 2010   debridement left knee scar tissue   LAPAROSCOPIC OOPHERECTOMY     1986, [place] Right 2003   Right Foot Neuroma, plus Bunionectomy, Right Great Toe   POLYPECTOMY  02/15/2021   Procedure: POLYPECTOMY;  Surgeon: Irving Copas., MD;  Location: [facility];  Service: Gastroenterology;;   [place] Left 01/2008   REPLACEMENT TOTAL KNEE Right 08/2008   retinal pucker correction Left 07/14/2012   schwannoma tumor (benign) removal  2011   right rib   [place]   Patient Active Problem List   Diagnosis Date Noted   Chronic pain of right knee 05/27/2021   OSA on CPAP 05/27/2021   LLQ pain 12/16/2020   History of diverticulitis 12/16/2020   Hematochezia 12/16/2020   Abnormal colonoscopy 12/16/2020   Diverticulitis of colon 08/15/2020   Sleep-related hypoxia 08/05/2019   OSA (obstructive sleep apnea) 08/05/2019   PLMD (periodic limb movement  disorder) 08/05/2019   Cerebellar 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 ([place]) 05/06/2019   Excessive daytime sleepiness 05/06/2019   Loud snoring 05/06/2019   Ventral hernia without obstruction or gangrene 02/22/2019   Chronic throat clearing 01/18/2019   Nocturnal cough 01/18/2019   Family history of colon cancer in father 01/18/2019   Diverticulosis 01/18/2019   Gait abnormality 12/27/2018   Numbness 12/27/2018   Status post total bilateral knee replacement 11/30/2018   DDD (degenerative disc disease), cervical 11/30/2018   Status post carpal tunnel release 11/30/2018   DDD (degenerative disc disease), lumbar 10/26/2018   Primary osteoarthritis of both hands 10/26/2018   Primary osteoarthritis of both feet 10/26/2018   Gastroesophageal reflux disease 08/27/2018   Bilateral  impacted cerumen 08/21/2018   Sensorineural hearing loss (SNHL) of both ears 08/21/2018   Tinnitus, bilateral 08/21/2018   Blood in urine 04/17/2018   Secondary open-angle glaucoma of left eye, moderate stage 03/10/2018   Early stage 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 10/05/2017   Epiretinal membrane (ERM) of left eye 10/05/2017   Pseudophakia of both eyes 10/05/2017   Osteopenia 03/19/2017   Mixed hyperlipidemia 08/18/2016   Generalized osteoarthritis of multiple sites 08/18/2016   Peripheral neuropathic pain 08/18/2016   Overactive bladder 08/18/2016   Glaucoma 08/18/2016   Cervical disc disorder with radiculopathy of cervical region 08/17/2016   Carpal tunnel syndrome, right upper limb 08/17/2016   Abnormal finding on mammography 06/14/2016   Breast lump 05/06/2015    PCP: Donnajean Lopes, MD  REFERRING PROVIDER: Donnajean Lopes, MD   REFERRING DIAG: Unsteadiness on feet [R26.81]   THERAPY DIAG:  Muscle weakness (generalized)  Other abnormalities of gait and mobility  Unsteadiness on feet  Rationale for Evaluation and Treatment: Rehabilitation  ONSET DATE: 2 years ago  SUBJECTIVE:   SUBJECTIVE STATEMENT: Pt denies any adverse effects after prior Rx.  Pt reports she has been doing her home balance exercises and they are challenging.  Pt denies pain currently.   FUNCTIONAL IMPROVEMENTS:  Pt reports improved performance of sit to stand transfers including toilet transfers.  Pt reports she is more "automatic" with transfers.  Pt is more confident in getting out of her recliner. Pt has not had any falls.  Balance FUNCTIONAL LIMITATIONS:  Pt states her gait has not improved.  Stairs.  Balance.  "I'm still at risk for falling".  Pt is unable to turn her head and talk to someone without losing her balance.  Pt lost her balance when trying to go down a step and turn to talk.  Pt braced against a door to not fall.          PERTINENT  HISTORY: Arthritis, Osteopenia, Peripheral neuropathy, and bilat TKR. PAIN:  Are you having pain? No She denies any knee or hip pain.   PRECAUTIONS: None  WEIGHT BEARING RESTRICTIONS: No  FALLS:  Has patient fallen in last 6 months? Yes. Number of falls 2  LIVING ENVIRONMENT: Lives with: lives alone Lives in: House/apartment Stairs: No Has following equipment at home: Single point cane, shower chair, and Grab bars  OCCUPATION: Retired  PLOF: Independent  PATIENT GOALS: Pt would like to improve her strength in bilat LE's and improve her balance.   NEXT MD VISIT:   OBJECTIVE:   DIAGNOSTIC FINDINGS: None at this time.     TODAY'S TREATMENT:  Gait:  increased foot slap bilat with decreased foot clearance bilat.      Ther Ex: Pt performed: Nustep L4 x5 mins UE/LE's  Lateral band walks with RTB x 3 laps at rail DF with RTB 3x10 bilat    Therapeutic Activities: Pt ascended stairs with a reciprocal gait with rail with SBA and descended stairs with a step thru gait with rail with SBA x 2 reps Lateral step ups on 6 inch step 2 x 10 reps each bilat with UE support on rail   Neuromuscular Re-education: Standing with head turns on floor and on airex x 10 reps bilat Walking with head turns without AD with CGA down hallway x 1 lap Pt ambulating over 4 hurdles with CGA with a reciprocal gait with UE support      PATIENT EDUCATION:  Education details:  POC, exercise form, objective findings, and rationale. Person educated: Patient Education method: Customer service manager, verbal cues Education comprehension: verbalized understanding and returned demonstration, verbal cues required  HOME EXERCISE PROGRAM: Access Code: [email or web address] URL: [email or web address] Date: 11/10/2022 Prepared by: Ronny Flurry  Updated HEP: -  Standing in a Staggered Stance  - 1 x daily - 7 x weekly - 2 reps - 20-30 seconds hold - Standing March with Counter Support  - 1 x daily - 5 x weekly - 2 sets - 10 reps  ASSESSMENT:  CLINICAL IMPRESSION:  Pt reports no improvement in gait and continue to have increased foot slap and decreased gait speed.  Pt reports having balance deficits with turning her head.  PT worked on static and dynamic exercises to improve balance with head turns.  Pt did have 1-2 LOB's with walking with head turns and used the Boehlke for self correction.  Pt requires the rail with stairs and continues to improve with ascending and descending stairs.  She responded well to Rx having no c/o's after Rx.  Pt should benefit from cont skilled PT services to improve balance, mobility, and strength and to address ongoing goals.    .   OBJECTIVE IMPAIRMENTS: decreased activity tolerance, difficulty walking, decreased balance, decreased endurance, decreased mobility, decreased ROM, decreased strength, impaired flexibility, impaired UE/LE use, postural dysfunction, and pain.  ACTIVITY LIMITATIONS: bending, lifting, carry, locomotion, cleaning, community activity, driving, and or occupation  PERSONAL FACTORS: Arthritis, Osteopenia, Peripheral neuropathy.  are also affecting patient's functional outcome.  REHAB POTENTIAL: Good  CLINICAL DECISION MAKING: Stable/uncomplicated  EVALUATION COMPLEXITY: Low   GOALS: Short term PT Goals Target date:11/29/2022 Pt will be I and compliant with HEP. Baseline:  Goal status: MET  Long term PT goals Target date: 02/13/2023 Pt will improve BERG by at least 3 points in order to demonstrate clinically significant improvement in balance.   Baseline:  Goal status: GOAL MET Pt will improve hip/knee strength to at least 5-/5 MMT to improve functional strength Baseline: Goal status: ONGOING Target dat:  03/28/23 Pt will report <1 fall during her duration in PT.         Baseline          Goal status: GOAL MET Pt will be able to ambulate community distances at least 1000 ft WNL gait pattern without complaints Baseline: Goal status: ONGOING Target dat:  03/28/23       5. Pt will be able to negotiate stairs with a reciprocal gait pattern       Baseline:        Goal status: PARTIALLY MET Target dat:  03/28/23  6. Pt will improve her STS by 2.3 seconds for Minimal clinically important difference.         Baseline:          Goal status: GOAL MET      7.  Pt will be independent with advanced HEP to maximize functional strength gains and balance for improved mobility and decreased fall risk.     Goal status:  INITIAL Target dat:  03/28/23   PLAN: PT FREQUENCY: 1-2 times per week   PT DURATION: 4-6 weeks  PLANNED INTERVENTIONS (unless contraindicated): aquatic PT, Canalith repositioning, cryotherapy, Electrical stimulation, Iontophoresis with 4 mg/ml dexamethasome, Moist heat, traction, Ultrasound, gait training, Therapeutic exercise, balance training, neuromuscular re-education, patient/family education, prosthetic training, manual techniques, passive ROM, dry needling, taping, vasopnuematic device, vestibular, spinal manipulations, joint manipulations  PLAN FOR NEXT SESSION:  Cont with LE strengthening, balance, stair negotiation and gait.  Cont to work on step ups.   Selinda Michaels III PT, DPT 02/17/23 12:32 PM

## 2023-02-21 ENCOUNTER — Ambulatory Visit (HOSPITAL_BASED_OUTPATIENT_CLINIC_OR_DEPARTMENT_OTHER): Payer: Medicare Other | Admitting: Physical Therapy

## 2023-02-21 ENCOUNTER — Encounter (HOSPITAL_BASED_OUTPATIENT_CLINIC_OR_DEPARTMENT_OTHER): Payer: Self-pay | Admitting: Physical Therapy

## 2023-02-21 DIAGNOSIS — R2689 Other abnormalities of gait and mobility: Secondary | ICD-10-CM

## 2023-02-21 DIAGNOSIS — R2681 Unsteadiness on feet: Secondary | ICD-10-CM

## 2023-02-21 DIAGNOSIS — M6281 Muscle weakness (generalized): Secondary | ICD-10-CM | POA: Diagnosis not present

## 2023-02-21 NOTE — Therapy (Signed)
OUTPATIENT PHYSICAL THERAPY LOWER EXTREMITY TREATMENT       Patient Name: Hailey Perkins MRN: [id number] DOB:[birth year], 88 y.o., female Today's Date: 02/22/2023  END OF SESSION:  PT End of Session - 02/21/23 1459     Visit Number 20    Number of Visits 28    Date for PT Re-Evaluation 03/28/23    Authorization Type [employer] MEDICARE    PT Start Time 1454    PT Stop Time [id number]    PT Time Calculation (min) 41 min    Activity Tolerance Patient tolerated treatment well    Behavior During Therapy WFL for tasks assessed/performed                 Past Medical History:  Diagnosis Date   Aortic atherosclerosis ([place])    Arthritis    Cataract 2010   bilateral eyes   Chicken pox    Cholelithiasis    Diverticulitis    Diverticulosis    GERD (gastroesophageal reflux disease)    Glaucoma    Hiatal hernia    History of frequent urinary tract infections    Hyperlipidemia    Osteopenia    Peripheral neuropathy    Sleep apnea    wears a CPAP   Urine incontinence    Past Surgical History:  Procedure Laterality Date   ABDOMINAL HYSTERECTOMY  1977   ANAL FISTULECTOMY  1985   ANKLE FRACTURE SURGERY Right 2014   APPENDECTOMY     BIOPSY  02/15/2021   Procedure: BIOPSY;  Surgeon: Irving Copas., MD;  [facility]: [facility];  Service: Gastroenterology;;   BREAST BIOPSY Bilateral 10+ yrs ago   BENIGN   BUNIONECTOMY Left 1975   Left Toe   CATARACT EXTRACTION, BILATERAL  2009   COLONOSCOPY WITH PROPOFOL N/A 02/15/2021   Procedure: COLONOSCOPY WITH PROPOFOL;  Surgeon: Irving Copas., MD;  Location: [facility];  Service: Gastroenterology;  Laterality: N/A;  ultraslimscope    DILATION AND CURETTAGE OF UTERUS  1972   eye [place] vision correction Left 10/09/2013   eye socket re-formed Left 2014   FOOT NEUROMA SURGERY Right 2003   plus bunionectomy, right great toe   HAMMER TOE SURGERY Right 2005   Right foot correction hammertoe little toe    HEMORRHOID SURGERY  1967   HIP SURGERY Right 2014   right hip femur pin implanted   KNEE ARTHROSCOPY Left 2010   debridement left knee scar tissue   LAPAROSCOPIC OOPHERECTOMY     1986, [place] Right 2003   Right Foot Neuroma, plus Bunionectomy, Right Great Toe   POLYPECTOMY  02/15/2021   Procedure: POLYPECTOMY;  Surgeon: Irving Copas., MD;  Location: [facility];  Service: Gastroenterology;;   [place] Left 01/2008   REPLACEMENT TOTAL KNEE Right 08/2008   retinal pucker correction Left 07/14/2012   schwannoma tumor (benign) removal  2011   right rib   [place]   Patient Active Problem List   Diagnosis Date Noted   Chronic pain of right knee 05/27/2021   OSA on CPAP 05/27/2021   LLQ pain 12/16/2020   History of diverticulitis 12/16/2020   Hematochezia 12/16/2020   Abnormal colonoscopy 12/16/2020   Diverticulitis of colon 08/15/2020   Sleep-related hypoxia 08/05/2019   OSA (obstructive sleep apnea) 08/05/2019   PLMD (periodic limb movement  disorder) 08/05/2019   Cerebellar 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 ([place]) 05/06/2019   Excessive daytime sleepiness 05/06/2019   Loud snoring 05/06/2019   Ventral hernia without obstruction or gangrene 02/22/2019   Chronic throat clearing 01/18/2019   Nocturnal cough 01/18/2019   Family history of colon cancer in father 01/18/2019   Diverticulosis 01/18/2019   Gait abnormality 12/27/2018   Numbness 12/27/2018   Status post total bilateral knee replacement 11/30/2018   DDD (degenerative disc disease), cervical 11/30/2018   Status post carpal tunnel release 11/30/2018   DDD (degenerative disc disease), lumbar 10/26/2018   Primary osteoarthritis of both hands 10/26/2018   Primary osteoarthritis of both feet 10/26/2018   Gastroesophageal reflux disease 08/27/2018   Bilateral  impacted cerumen 08/21/2018   Sensorineural hearing loss (SNHL) of both ears 08/21/2018   Tinnitus, bilateral 08/21/2018   Blood in urine 04/17/2018   Secondary open-angle glaucoma of left eye, moderate stage 03/10/2018   Early stage 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 10/05/2017   Epiretinal membrane (ERM) of left eye 10/05/2017   Pseudophakia of both eyes 10/05/2017   Osteopenia 03/19/2017   Mixed hyperlipidemia 08/18/2016   Generalized osteoarthritis of multiple sites 08/18/2016   Peripheral neuropathic pain 08/18/2016   Overactive bladder 08/18/2016   Glaucoma 08/18/2016   Cervical disc disorder with radiculopathy of cervical region 08/17/2016   Carpal tunnel syndrome, right upper limb 08/17/2016   Abnormal finding on mammography 06/14/2016   Breast lump 05/06/2015    PCP: Donnajean Lopes, MD  REFERRING PROVIDER: Donnajean Lopes, MD   REFERRING DIAG: Unsteadiness on feet [R26.81]   THERAPY DIAG:  Muscle weakness (generalized)  Other abnormalities of gait and mobility  Unsteadiness on feet  Rationale for Evaluation and Treatment: Rehabilitation  ONSET DATE: 2 years ago  SUBJECTIVE:   SUBJECTIVE STATEMENT: Pt denies any adverse effects after prior Rx.  Pt reports she has performed some of her HEP.  Pt denies pain currently.   FUNCTIONAL IMPROVEMENTS:  Pt reports improved performance of sit to stand transfers including toilet transfers.  Pt reports she is more "automatic" with transfers.  Pt is more confident in getting out of her recliner. Pt has not had any falls.  Balance FUNCTIONAL LIMITATIONS:  Pt states her gait has not improved.  Stairs.  Balance.  Pt is unable to turn her head and talk to someone without losing her balance.  Pt lost her balance when trying to go down a step and turn to talk.  Pt braced against a door to not fall.          PERTINENT HISTORY: Arthritis, Osteopenia, Peripheral neuropathy, and bilat TKR. PAIN:  Are  you having pain? No She denies any knee or hip pain.   PRECAUTIONS: None  WEIGHT BEARING RESTRICTIONS: No  FALLS:  Has patient fallen in last 6 months? Yes. Number of falls 2  LIVING ENVIRONMENT: Lives with: lives alone Lives in: House/apartment Stairs: No Has following equipment at home: Single point cane, shower chair, and Grab bars  OCCUPATION: Retired  PLOF: Independent  PATIENT GOALS: Pt would like to improve her strength in bilat LE's and improve her balance.   NEXT MD VISIT:   OBJECTIVE:   DIAGNOSTIC FINDINGS: None at this time.     TODAY'S TREATMENT:  Gait:  increased foot slap bilat with decreased foot clearance bilat.      Ther Ex: Pt performed: Nustep L4 x5 mins UE/LE's  Lateral band walks with GTB x 3 laps at rail Standing heel raises 2x10    Therapeutic Activities: Pt ascended stairs with a reciprocal gait with rail with SBA and descended stairs with a step thru gait with rail with SBA x 1 rep Lateral step ups on 6 inch step 2 x 10 reps each bilat with UE support on rail   Neuromuscular Re-education: Standing with head turns on airex 2 x 10 reps bilat Small step modified tandem stance 2x30 sec each with CGA Walking with head turns without AD with CGA down hallway x 2 laps Pt ambulating over 4 hurdles with CGA with a reciprocal gait with UE support Pt ambulated over 4 hurdles with a step to gait pattern without UE support with CGA and occasional min assist       PATIENT EDUCATION:  Education details:  POC, exercise form, objective findings, and rationale. Person educated: Patient Education method: Customer service manager, verbal cues Education comprehension: verbalized understanding and returned demonstration, verbal cues required  HOME EXERCISE PROGRAM: Access Code: [email or web address] URL:  [email or web address] Date: 11/10/2022 Prepared by: Ronny Flurry   ASSESSMENT:  CLINICAL IMPRESSION:  Pt reports having balance deficits with turning her head to speak to someone.  PT worked on static and dynamic exercises to improve balance with head turns.  Pt had improved balance and control with ambulating with head turns.  Pt has improved balance though continues to have gait deficits including increased foot slap.  Pt gives good effort with all exercises.  Pt attempted standing toe raises though unable to perform.  She responded well to Rx having no c/o's after Rx.  Pt should benefit from cont skilled PT services to improve balance, mobility, and strength and to address ongoing goals.    OBJECTIVE IMPAIRMENTS: decreased activity tolerance, difficulty walking, decreased balance, decreased endurance, decreased mobility, decreased ROM, decreased strength, impaired flexibility, impaired UE/LE use, postural dysfunction, and pain.  ACTIVITY LIMITATIONS: bending, lifting, carry, locomotion, cleaning, community activity, driving, and or occupation  PERSONAL FACTORS: Arthritis, Osteopenia, Peripheral neuropathy.  are also affecting patient's functional outcome.  REHAB POTENTIAL: Good  CLINICAL DECISION MAKING: Stable/uncomplicated  EVALUATION COMPLEXITY: Low   GOALS: Short term PT Goals Target date:11/29/2022 Pt will be I and compliant with HEP. Baseline:  Goal status: MET  Long term PT goals Target date: 02/13/2023 Pt will improve BERG by at least 3 points in order to demonstrate clinically significant improvement in balance.   Baseline:  Goal status: GOAL MET Pt will improve hip/knee strength to at least 5-/5 MMT to improve functional strength Baseline: Goal status: ONGOING Target dat:  03/28/23 Pt will report <1 fall during her duration in PT.         Baseline         Goal status: GOAL MET Pt will be able to ambulate community distances at least 1000 ft WNL gait  pattern without complaints Baseline: Goal status: ONGOING Target dat:  03/28/23       5. Pt will be able to negotiate stairs with a reciprocal gait pattern       Baseline:        Goal status: PARTIALLY MET Target dat:  03/28/23       6. Pt will improve her STS by 2.3 seconds for Minimal clinically important difference.  Baseline:          Goal status: GOAL MET      7.  Pt will be independent with advanced HEP to maximize functional strength gains and balance for improved mobility and decreased fall risk.     Goal status:  INITIAL Target dat:  03/28/23   PLAN: PT FREQUENCY: 1-2 times per week   PT DURATION: 4-6 weeks  PLANNED INTERVENTIONS (unless contraindicated): aquatic PT, Canalith repositioning, cryotherapy, Electrical stimulation, Iontophoresis with 4 mg/ml dexamethasome, Moist heat, traction, Ultrasound, gait training, Therapeutic exercise, balance training, neuromuscular re-education, patient/family education, prosthetic training, manual techniques, passive ROM, dry needling, taping, vasopnuematic device, vestibular, spinal manipulations, joint manipulations  PLAN FOR NEXT SESSION:  Cont with LE strengthening, balance, stair negotiation and gait.  Cont to work on step ups.   Selinda Michaels III PT, DPT 02/22/23 4:01 PM

## 2023-02-24 ENCOUNTER — Ambulatory Visit (HOSPITAL_BASED_OUTPATIENT_CLINIC_OR_DEPARTMENT_OTHER): Payer: Medicare Other | Admitting: Physical Therapy

## 2023-02-24 ENCOUNTER — Encounter (HOSPITAL_BASED_OUTPATIENT_CLINIC_OR_DEPARTMENT_OTHER): Payer: Self-pay | Admitting: Physical Therapy

## 2023-02-24 DIAGNOSIS — M6281 Muscle weakness (generalized): Secondary | ICD-10-CM | POA: Diagnosis not present

## 2023-02-24 DIAGNOSIS — R2681 Unsteadiness on feet: Secondary | ICD-10-CM

## 2023-02-24 DIAGNOSIS — R2689 Other abnormalities of gait and mobility: Secondary | ICD-10-CM

## 2023-02-24 NOTE — Therapy (Signed)
OUTPATIENT PHYSICAL THERAPY LOWER EXTREMITY TREATMENT       Patient Name: Hailey Perkins MRN: [id number] DOB:[birth year], 88 y.o., female Today's Date: 02/24/2023  END OF SESSION:  PT End of Session - 02/24/23 0848     Visit Number 21    Number of Visits 28    Date for PT Re-Evaluation 03/28/23    Authorization Type [employer] MEDICARE    PT Start Time [phone]    PT Stop Time 0930    PT Time Calculation (min) 42 min    Activity Tolerance Patient tolerated treatment well    Behavior During Therapy WFL for tasks assessed/performed                 Past Medical History:  Diagnosis Date   Aortic atherosclerosis ([place])    Arthritis    Cataract 2010   bilateral eyes   Chicken pox    Cholelithiasis    Diverticulitis    Diverticulosis    GERD (gastroesophageal reflux disease)    Glaucoma    Hiatal hernia    History of frequent urinary tract infections    Hyperlipidemia    Osteopenia    Peripheral neuropathy    Sleep apnea    wears a CPAP   Urine incontinence    Past Surgical History:  Procedure Laterality Date   ABDOMINAL HYSTERECTOMY  1977   ANAL FISTULECTOMY  1985   ANKLE FRACTURE SURGERY Right 2014   APPENDECTOMY     BIOPSY  02/15/2021   Procedure: BIOPSY;  Surgeon: Irving Copas., MD;  Location: [facility];  Service: Gastroenterology;;   BREAST BIOPSY Bilateral 10+ yrs ago   BENIGN   BUNIONECTOMY Left 1975   Left Toe   CATARACT EXTRACTION, BILATERAL  2009   COLONOSCOPY WITH PROPOFOL N/A 02/15/2021   Procedure: COLONOSCOPY WITH PROPOFOL;  Surgeon: Irving Copas., MD;  Location: [facility];  Service: Gastroenterology;  Laterality: N/A;  ultraslimscope    DILATION AND CURETTAGE OF UTERUS  1972   eye [place] vision correction Left 10/09/2013   eye socket re-formed Left 2014   FOOT NEUROMA SURGERY Right 2003   plus bunionectomy, right great toe   HAMMER TOE SURGERY Right 2005   Right foot correction hammertoe little toe    HEMORRHOID SURGERY  1967   HIP SURGERY Right 2014   right hip femur pin implanted   KNEE ARTHROSCOPY Left 2010   debridement left knee scar tissue   LAPAROSCOPIC OOPHERECTOMY     1986, [place] Right 2003   Right Foot Neuroma, plus Bunionectomy, Right Great Toe   POLYPECTOMY  02/15/2021   Procedure: POLYPECTOMY;  Surgeon: Irving Copas., MD;  Location: [facility];  Service: Gastroenterology;;   [place] Left 01/2008   REPLACEMENT TOTAL KNEE Right 08/2008   retinal pucker correction Left 07/14/2012   schwannoma tumor (benign) removal  2011   right rib   [place]   Patient Active Problem List   Diagnosis Date Noted   Chronic pain of right knee 05/27/2021   OSA on CPAP 05/27/2021   LLQ pain 12/16/2020   History of diverticulitis 12/16/2020   Hematochezia 12/16/2020   Abnormal colonoscopy 12/16/2020   Diverticulitis of colon 08/15/2020   Sleep-related hypoxia 08/05/2019   OSA (obstructive sleep apnea) 08/05/2019   PLMD (periodic limb movement  disorder) 08/05/2019   Cerebellar 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 ([place]) 05/06/2019   Excessive daytime sleepiness 05/06/2019   Loud snoring 05/06/2019   Ventral hernia without obstruction or gangrene 02/22/2019   Chronic throat clearing 01/18/2019   Nocturnal cough 01/18/2019   Family history of colon cancer in father 01/18/2019   Diverticulosis 01/18/2019   Gait abnormality 12/27/2018   Numbness 12/27/2018   Status post total bilateral knee replacement 11/30/2018   DDD (degenerative disc disease), cervical 11/30/2018   Status post carpal tunnel release 11/30/2018   DDD (degenerative disc disease), lumbar 10/26/2018   Primary osteoarthritis of both hands 10/26/2018   Primary osteoarthritis of both feet 10/26/2018   Gastroesophageal reflux disease 08/27/2018   Bilateral  impacted cerumen 08/21/2018   Sensorineural hearing loss (SNHL) of both ears 08/21/2018   Tinnitus, bilateral 08/21/2018   Blood in urine 04/17/2018   Secondary open-angle glaucoma of left eye, moderate stage 03/10/2018   Early stage 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 10/05/2017   Epiretinal membrane (ERM) of left eye 10/05/2017   Pseudophakia of both eyes 10/05/2017   Osteopenia 03/19/2017   Mixed hyperlipidemia 08/18/2016   Generalized osteoarthritis of multiple sites 08/18/2016   Peripheral neuropathic pain 08/18/2016   Overactive bladder 08/18/2016   Glaucoma 08/18/2016   Cervical disc disorder with radiculopathy of cervical region 08/17/2016   Carpal tunnel syndrome, right upper limb 08/17/2016   Abnormal finding on mammography 06/14/2016   Breast lump 05/06/2015    PCP: Donnajean Lopes, MD  REFERRING PROVIDER: Donnajean Lopes, MD   REFERRING DIAG: Unsteadiness on feet [R26.81]   THERAPY DIAG:  Muscle weakness (generalized)  Other abnormalities of gait and mobility  Unsteadiness on feet  Rationale for Evaluation and Treatment: Rehabilitation  ONSET DATE: 2 years ago  SUBJECTIVE:   SUBJECTIVE STATEMENT: Pt denies any adverse effects after prior Rx.  Pt reports she performed her HEP yesterday.  Pt denies pain currently.   FUNCTIONAL IMPROVEMENTS:  Pt reports improved performance of sit to stand transfers including toilet transfers.  Pt reports she is more "automatic" with transfers.  Pt is more confident in getting out of her recliner. Pt has not had any falls.  Balance FUNCTIONAL LIMITATIONS:  Pt states her gait has not improved.  Stairs.  Balance.  Pt is unable to turn her head and talk to someone without losing her balance.  Pt lost her balance when trying to go down a step and turn to talk.  Pt braced against a door to not fall.          PERTINENT HISTORY: Arthritis, Osteopenia, Peripheral neuropathy, and bilat TKR. PAIN:  Are  you having pain? No She denies any pain.   PRECAUTIONS: None  WEIGHT BEARING RESTRICTIONS: No  FALLS:  Has patient fallen in last 6 months? Yes. Number of falls 2  LIVING ENVIRONMENT: Lives with: lives alone Lives in: House/apartment Stairs: No Has following equipment at home: Single point cane, shower chair, and Grab bars  OCCUPATION: Retired  PLOF: Independent  PATIENT GOALS: Pt would like to improve her strength in bilat LE's and improve her balance.   NEXT MD VISIT:   OBJECTIVE:   DIAGNOSTIC FINDINGS: None at this time.     TODAY'S TREATMENT:  Gait:  decreased step length bilat.     Ther Ex: Pt performed: Nustep L4 x5 mins UE/LE's  Lateral band walks with GTB x 3 laps at rail Standing heel raises 2x10   Therapeutic Activities: Pt ascended stairs with a reciprocal gait with rail with SBA and descended stairs with a step thru gait with rail with SBA x 1 rep Lateral step ups on 6 inch step 2 x 10 reps each bilat with UE support on rail Sit to stands with GTB around thighs 2x10   Neuromuscular Re-education: Standing with head turns on airex x 15 reps bilat Walking with head turns without AD with CGA down hallway x 2 laps with cuing to increase step length Pt ambulating around 4 hurdles with CGA without UE support x 3 laps      PATIENT EDUCATION:  Education details:  POC, exercise form, objective findings, and rationale. Person educated: Patient Education method: Customer service manager, verbal cues Education comprehension: verbalized understanding and returned demonstration, verbal cues required  HOME EXERCISE PROGRAM: Access Code: [email or web address] URL: [email or web address] Date: 11/10/2022 Prepared by: Ronny Flurry   ASSESSMENT:  CLINICAL IMPRESSION:  Pt continues to improve with performance of stairs  and demonstrates improved control with descending stairs.  Pt continues to have decreased step length bilat which worsens when walking with head turns.  Pt required cuing to increase step length when walking with head turns.  She had minimal LOB's though was able to self correct and didn't require physical assistance from PT.  Pt required CGA during walking with head turns.  Pt gives good effort with all exercises.  PT progressed sit to stands with adding theraband around thighs.  She has good height on heel raises.  Pt performed lateral band walks without UE support.  She responded well to Rx having no c/o's after Rx.       OBJECTIVE IMPAIRMENTS: decreased activity tolerance, difficulty walking, decreased balance, decreased endurance, decreased mobility, decreased ROM, decreased strength, impaired flexibility, impaired UE/LE use, postural dysfunction, and pain.  ACTIVITY LIMITATIONS: bending, lifting, carry, locomotion, cleaning, community activity, driving, and or occupation  PERSONAL FACTORS: Arthritis, Osteopenia, Peripheral neuropathy.  are also affecting patient's functional outcome.  REHAB POTENTIAL: Good  CLINICAL DECISION MAKING: Stable/uncomplicated  EVALUATION COMPLEXITY: Low   GOALS: Short term PT Goals Target date:11/29/2022 Pt will be I and compliant with HEP. Baseline:  Goal status: MET  Long term PT goals Target date: 02/13/2023 Pt will improve BERG by at least 3 points in order to demonstrate clinically significant improvement in balance.   Baseline:  Goal status: GOAL MET Pt will improve hip/knee strength to at least 5-/5 MMT to improve functional strength Baseline: Goal status: ONGOING Target dat:  03/28/23 Pt will report <1 fall during her duration in PT.         Baseline         Goal status: GOAL MET Pt will be able to ambulate community distances at least 1000 ft WNL gait pattern without complaints Baseline: Goal status: ONGOING Target dat:  03/28/23       5.  Pt will be able to negotiate stairs with a reciprocal gait pattern       Baseline:        Goal status: PARTIALLY MET Target dat:  03/28/23       6. Pt will improve her STS by 2.3 seconds for Minimal clinically important difference.         Baseline:  Goal status: GOAL MET      7.  Pt will be independent with advanced HEP to maximize functional strength gains and balance for improved mobility and decreased fall risk.     Goal status:  INITIAL Target dat:  03/28/23   PLAN: PT FREQUENCY: 1-2 times per week   PT DURATION: 4-6 weeks  PLANNED INTERVENTIONS (unless contraindicated): aquatic PT, Canalith repositioning, cryotherapy, Electrical stimulation, Iontophoresis with 4 mg/ml dexamethasome, Moist heat, traction, Ultrasound, gait training, Therapeutic exercise, balance training, neuromuscular re-education, patient/family education, prosthetic training, manual techniques, passive ROM, dry needling, taping, vasopnuematic device, vestibular, spinal manipulations, joint manipulations  PLAN FOR NEXT SESSION:  Cont with LE strengthening, balance, stair negotiation and gait.    Selinda Michaels III PT, DPT 02/24/23 11:45 AM

## 2023-02-28 ENCOUNTER — Ambulatory Visit (HOSPITAL_BASED_OUTPATIENT_CLINIC_OR_DEPARTMENT_OTHER): Payer: Medicare Other | Admitting: Physical Therapy

## 2023-02-28 DIAGNOSIS — R2689 Other abnormalities of gait and mobility: Secondary | ICD-10-CM

## 2023-02-28 DIAGNOSIS — M6281 Muscle weakness (generalized): Secondary | ICD-10-CM

## 2023-02-28 DIAGNOSIS — R2681 Unsteadiness on feet: Secondary | ICD-10-CM

## 2023-02-28 NOTE — Therapy (Signed)
OUTPATIENT PHYSICAL THERAPY LOWER EXTREMITY TREATMENT       Patient Name: Hailey Perkins MRN: [id number] DOB:1935-07-14, 88 y.o., female Today's Date: 03/01/2023  END OF SESSION:  PT End of Session - 02/28/23 0855     Visit Number 22    Number of Visits 28    Date for PT Re-Evaluation 03/28/23    Authorization Type [employer] MEDICARE    PT Start Time [phone]    PT Stop Time 0932    PT Time Calculation (min) 40 min    Activity Tolerance Patient tolerated treatment well    Behavior During Therapy [facility] for tasks assessed/performed                 Past Medical History:  Diagnosis Date   Aortic atherosclerosis ([place])    Arthritis    Cataract 2010   bilateral eyes   Chicken pox    Cholelithiasis    Diverticulitis    Diverticulosis    GERD (gastroesophageal reflux disease)    Glaucoma    Hiatal hernia    History of frequent urinary tract infections    Hyperlipidemia    Osteopenia    Peripheral neuropathy    Sleep apnea    wears a CPAP   Urine incontinence    Past Surgical History:  Procedure Laterality Date   ABDOMINAL HYSTERECTOMY  1977   ANAL FISTULECTOMY  1985   ANKLE FRACTURE SURGERY Right 2014   APPENDECTOMY     BIOPSY  02/15/2021   Procedure: BIOPSY;  Surgeon: Irving Copas., MD;  Location: [facility];  Service: Gastroenterology;;   BREAST BIOPSY Bilateral 10+ yrs ago   BENIGN   BUNIONECTOMY Left 1975   Left Toe   CATARACT EXTRACTION, BILATERAL  2009   COLONOSCOPY WITH PROPOFOL N/A 02/15/2021   Procedure: COLONOSCOPY WITH PROPOFOL;  Surgeon: Irving Copas., MD;  Location: [facility];  Service: Gastroenterology;  Laterality: N/A;  ultraslimscope    DILATION AND CURETTAGE OF UTERUS  1972   eye [place] vision correction Left 10/09/2013   eye socket re-formed Left 2014   FOOT NEUROMA SURGERY Right 2003   plus bunionectomy, right great toe   HAMMER TOE SURGERY Right 2005   Right foot correction hammertoe little toe    HEMORRHOID SURGERY  1967   HIP SURGERY Right 2014   right hip femur pin implanted   KNEE ARTHROSCOPY Left 2010   debridement left knee scar tissue   LAPAROSCOPIC OOPHERECTOMY     1986, [place] Right 2003   Right Foot Neuroma, plus Bunionectomy, Right Great Toe   POLYPECTOMY  02/15/2021   Procedure: POLYPECTOMY;  Surgeon: Irving Copas., MD;  Location: [facility];  Service: Gastroenterology;;   [place] Left 01/2008   REPLACEMENT TOTAL KNEE Right 08/2008   retinal pucker correction Left 07/14/2012   schwannoma tumor (benign) removal  2011   right rib   [place]   Patient Active Problem List   Diagnosis Date Noted   Chronic pain of right knee 05/27/2021   OSA on CPAP 05/27/2021   LLQ pain 12/16/2020   History of diverticulitis 12/16/2020   Hematochezia 12/16/2020   Abnormal colonoscopy 12/16/2020   Diverticulitis of colon 08/15/2020   Sleep-related hypoxia 08/05/2019   OSA (obstructive sleep apnea) 08/05/2019   PLMD (periodic limb movement  disorder) 08/05/2019   Cerebellar 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 ([place]) 05/06/2019   Excessive daytime sleepiness 05/06/2019   Loud snoring 05/06/2019   Ventral hernia without obstruction or gangrene 02/22/2019   Chronic throat clearing 01/18/2019   Nocturnal cough 01/18/2019   Family history of colon cancer in father 01/18/2019   Diverticulosis 01/18/2019   Gait abnormality 12/27/2018   Numbness 12/27/2018   Status post total bilateral knee replacement 11/30/2018   DDD (degenerative disc disease), cervical 11/30/2018   Status post carpal tunnel release 11/30/2018   DDD (degenerative disc disease), lumbar 10/26/2018   Primary osteoarthritis of both hands 10/26/2018   Primary osteoarthritis of both feet 10/26/2018   Gastroesophageal reflux disease 08/27/2018   Bilateral  impacted cerumen 08/21/2018   Sensorineural hearing loss (SNHL) of both ears 08/21/2018   Tinnitus, bilateral 08/21/2018   Blood in urine 04/17/2018   Secondary open-angle glaucoma of left eye, moderate stage 03/10/2018   Early stage 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 10/05/2017   Epiretinal membrane (ERM) of left eye 10/05/2017   Pseudophakia of both eyes 10/05/2017   Osteopenia 03/19/2017   Mixed hyperlipidemia 08/18/2016   Generalized osteoarthritis of multiple sites 08/18/2016   Peripheral neuropathic pain 08/18/2016   Overactive bladder 08/18/2016   Glaucoma 08/18/2016   Cervical disc disorder with radiculopathy of cervical region 08/17/2016   Carpal tunnel syndrome, right upper limb 08/17/2016   Abnormal finding on mammography 06/14/2016   Breast lump 05/06/2015    PCP: Donnajean Lopes, MD  REFERRING PROVIDER: Donnajean Lopes, MD   REFERRING DIAG: Unsteadiness on feet [R26.81]   THERAPY DIAG:  Muscle weakness (generalized)  Other abnormalities of gait and mobility  Unsteadiness on feet  Rationale for Evaluation and Treatment: Rehabilitation  ONSET DATE: 2 years ago  SUBJECTIVE:   SUBJECTIVE STATEMENT: Pt denies any adverse effects after prior Rx.  Pt reports she performed her HEP yesterday.  Pt denies pain currently.  Pt states the sidestep exercise has helped her kitchen.  She feels much stronger and more confident with movement.   FUNCTIONAL IMPROVEMENTS:  Pt reports improved performance of sit to stand transfers including toilet transfers.  Pt reports she is more "automatic" with transfers.  Pt is more confident in getting out of her recliner. Pt has not had any falls.  Balance FUNCTIONAL LIMITATIONS:  Pt states her gait has not improved.  Stairs.  Balance.  Pt is unable to turn her head and talk to someone without losing her balance.  Pt lost her balance when trying to go down a step and turn to talk.  Pt braced against a door to not  fall.          PERTINENT HISTORY: Arthritis, Osteopenia, Peripheral neuropathy, and bilat TKR. PAIN:  Are you having pain? No She denies any pain.   PRECAUTIONS: None  WEIGHT BEARING RESTRICTIONS: No  FALLS:  Has patient fallen in last 6 months? Yes. Number of falls 2  LIVING ENVIRONMENT: Lives with: lives alone Lives in: House/apartment Stairs: No Has following equipment at home: Single point cane, shower chair, and Grab bars  OCCUPATION: Retired  PLOF: Independent  PATIENT GOALS: Pt would like to improve her strength in bilat LE's and improve her balance.   NEXT MD VISIT:   OBJECTIVE:   DIAGNOSTIC FINDINGS: None at this time.     TODAY'S TREATMENT:  Gait:  decreased step length bilat.  Decreased toe off bilat.      Ther Ex: Pt performed: Nustep L4 x5 mins UE/LE's  Lateral band walks with GTB x 3 laps at rail Standing heel raises 2x10   Therapeutic Activities: Pt ascended stairs with a reciprocal gait with rail with SBA and descended stairs with a step thru gait with rail with SBA x 1 rep Sit to stands with GTB around thighs 2x10   Neuromuscular Re-education: Tandem stance with frequent UE support with SBA/CGA 2x30 sec each Standing with head turns on airex 2x10 with 2 occasions of UE assist for LOB on 1st set and didn't use UE assist with SBA/CGA Walking with head turns without AD with CGA down hallway x 2 laps with cuing to increase step length Pt ambulating around 4 hurdles with CGA without UE support x 3 laps      PATIENT EDUCATION:  Education details:  POC, exercise form, objective findings, and rationale. Person educated: Patient Education method: Customer service manager, verbal cues Education comprehension: verbalized understanding and returned demonstration, verbal cues required  HOME EXERCISE  PROGRAM: Access Code: [email or web address] URL: [email or web address] Date: 11/10/2022 Prepared by: Ronny Flurry   ASSESSMENT:  CLINICAL IMPRESSION:  Pt reports improved mobility in the kitchen and has improved confidence with her movement in the kitchen.  Pt continues to improve with performance of stairs and demonstrates improved control with descending stairs.  Pt continues to have decreased step length bilat which worsens when walking with head turns.  She had minimal LOB's though was able to self correct and didn't require physical assistance from PT.  Pt required CGA during walking with head turns.  Pt has minimal LOB's t/o Rx with changing directions though is able to self correct LOB without PT assistance.  Pt gives good effort with all exercises.  Pt performed lateral band walks without UE support.  She responded well to Rx having no c/o's after Rx.          OBJECTIVE IMPAIRMENTS: decreased activity tolerance, difficulty walking, decreased balance, decreased endurance, decreased mobility, decreased ROM, decreased strength, impaired flexibility, impaired UE/LE use, postural dysfunction, and pain.  ACTIVITY LIMITATIONS: bending, lifting, carry, locomotion, cleaning, community activity, driving, and or occupation  PERSONAL FACTORS: Arthritis, Osteopenia, Peripheral neuropathy.  are also affecting patient's functional outcome.  REHAB POTENTIAL: Good  CLINICAL DECISION MAKING: Stable/uncomplicated  EVALUATION COMPLEXITY: Low   GOALS: Short term PT Goals Target date:11/29/2022 Pt will be I and compliant with HEP. Baseline:  Goal status: MET  Long term PT goals Target date: 02/13/2023 Pt will improve BERG by at least 3 points in order to demonstrate clinically significant improvement in balance.   Baseline:  Goal status: GOAL MET Pt will improve hip/knee strength to at least 5-/5 MMT to improve functional strength Baseline: Goal status: ONGOING Target dat:  03/28/23 Pt  will report <1 fall during her duration in PT.         Baseline         Goal status: GOAL MET Pt will be able to ambulate community distances at least 1000 ft WNL gait pattern without complaints Baseline: Goal status: ONGOING Target dat:  03/28/23       5. Pt will be able to negotiate stairs with a reciprocal gait pattern       Baseline:        Goal status: PARTIALLY MET Target dat:  03/28/23       6. Pt will improve her STS by  2.3 seconds for Minimal clinically important difference.         Baseline:          Goal status: GOAL MET      7.  Pt will be independent with advanced HEP to maximize functional strength gains and balance for improved mobility and decreased fall risk.     Goal status:  INITIAL Target dat:  03/28/23   PLAN: PT FREQUENCY: 1-2 times per week   PT DURATION: 4-6 weeks  PLANNED INTERVENTIONS (unless contraindicated): aquatic PT, Canalith repositioning, cryotherapy, Electrical stimulation, Iontophoresis with 4 mg/ml dexamethasome, Moist heat, traction, Ultrasound, gait training, Therapeutic exercise, balance training, neuromuscular re-education, patient/family education, prosthetic training, manual techniques, passive ROM, dry needling, taping, vasopnuematic device, vestibular, spinal manipulations, joint manipulations  PLAN FOR NEXT SESSION:  Cont with LE strengthening, balance, stair negotiation and gait.    Selinda Michaels III PT, DPT 03/01/23 8:46 AM

## 2023-03-01 ENCOUNTER — Encounter (HOSPITAL_BASED_OUTPATIENT_CLINIC_OR_DEPARTMENT_OTHER): Payer: Self-pay | Admitting: Physical Therapy

## 2023-03-06 ENCOUNTER — Ambulatory Visit (HOSPITAL_BASED_OUTPATIENT_CLINIC_OR_DEPARTMENT_OTHER): Payer: Medicare Other | Attending: Internal Medicine | Admitting: Physical Therapy

## 2023-03-06 ENCOUNTER — Encounter (HOSPITAL_BASED_OUTPATIENT_CLINIC_OR_DEPARTMENT_OTHER): Payer: Self-pay | Admitting: Physical Therapy

## 2023-03-06 DIAGNOSIS — R2689 Other abnormalities of gait and mobility: Secondary | ICD-10-CM | POA: Diagnosis present

## 2023-03-06 DIAGNOSIS — R2681 Unsteadiness on feet: Secondary | ICD-10-CM

## 2023-03-06 DIAGNOSIS — M6281 Muscle weakness (generalized): Secondary | ICD-10-CM

## 2023-03-06 NOTE — Therapy (Signed)
OUTPATIENT PHYSICAL THERAPY LOWER EXTREMITY TREATMENT       Patient Name: Hailey Perkins MRN: [id number] DOB:1935/08/29, 88 y.o., female Today's Date: 03/07/2023  END OF SESSION:  PT End of Session - 03/06/23 1057     Visit Number 23    Number of Visits 28    Date for PT Re-Evaluation 03/28/23    Authorization Type [employer]    PT Start Time 1027    PT Stop Time 1107    PT Time Calculation (min) 40 min    Activity Tolerance Patient tolerated treatment well    Behavior During Therapy WFL for tasks assessed/performed                  Past Medical History:  Diagnosis Date   Aortic atherosclerosis    Arthritis    Cataract 2010   bilateral eyes   Chicken pox    Cholelithiasis    Diverticulitis    Diverticulosis    GERD (gastroesophageal reflux disease)    Glaucoma    Hiatal hernia    History of frequent urinary tract infections    Hyperlipidemia    Osteopenia    Peripheral neuropathy    Sleep apnea    wears a CPAP   Urine incontinence    Past Surgical History:  Procedure Laterality Date   ABDOMINAL HYSTERECTOMY  1977   ANAL FISTULECTOMY  1985   ANKLE FRACTURE SURGERY Right 2014   APPENDECTOMY     BIOPSY  02/15/2021   Procedure: BIOPSY;  Surgeon: Irving Copas., MD;  Location: [facility];  Service: Gastroenterology;;   BREAST BIOPSY Bilateral 10+ yrs ago   BENIGN   BUNIONECTOMY Left 1975   Left Toe   CATARACT EXTRACTION, BILATERAL  2009   COLONOSCOPY WITH PROPOFOL N/A 02/15/2021   Procedure: COLONOSCOPY WITH PROPOFOL;  Surgeon: Irving Copas., MD;  Location: [facility];  Service: Gastroenterology;  Laterality: N/A;  ultraslimscope    DILATION AND CURETTAGE OF UTERUS  1972   eye [place] vision correction Left 10/09/2013   eye socket re-formed Left 2014   FOOT NEUROMA SURGERY Right 2003   plus bunionectomy, right great toe   HAMMER TOE SURGERY Right 2005   Right foot correction hammertoe little toe   HEMORRHOID  SURGERY  1967   HIP SURGERY Right 2014   right hip femur pin implanted   KNEE ARTHROSCOPY Left 2010   debridement left knee scar tissue   LAPAROSCOPIC OOPHERECTOMY     1986, [place] Right 2003   Right Foot Neuroma, plus Bunionectomy, Right Great Toe   POLYPECTOMY  02/15/2021   Procedure: POLYPECTOMY;  Surgeon: Irving Copas., MD;  Location: [facility];  Service: Gastroenterology;;   [place] Left 01/2008   REPLACEMENT TOTAL KNEE Right 08/2008   retinal pucker correction Left 07/14/2012   schwannoma tumor (benign) removal  2011   right rib   [place]   Patient Active Problem List   Diagnosis Date Noted   Chronic pain of right knee 05/27/2021   OSA on CPAP 05/27/2021   LLQ pain 12/16/2020   History of diverticulitis 12/16/2020   Hematochezia 12/16/2020   Abnormal colonoscopy 12/16/2020   Diverticulitis of colon 08/15/2020   Sleep-related hypoxia 08/05/2019   OSA (obstructive sleep apnea) 08/05/2019   PLMD (periodic limb movement  disorder) 08/05/2019   Cerebellar 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 05/06/2019   Excessive daytime sleepiness 05/06/2019   Loud snoring 05/06/2019   Ventral hernia without obstruction or gangrene 02/22/2019   Chronic throat clearing 01/18/2019   Nocturnal cough 01/18/2019   Family history of colon cancer in father 01/18/2019   Diverticulosis 01/18/2019   Gait abnormality 12/27/2018   Numbness 12/27/2018   Status post total bilateral knee replacement 11/30/2018   DDD (degenerative disc disease), cervical 11/30/2018   Status post carpal tunnel release 11/30/2018   DDD (degenerative disc disease), lumbar 10/26/2018   Primary osteoarthritis of both hands 10/26/2018   Primary osteoarthritis of both feet 10/26/2018   Gastroesophageal reflux disease 08/27/2018   Bilateral impacted cerumen  08/21/2018   Sensorineural hearing loss (SNHL) of both ears 08/21/2018   Tinnitus, bilateral 08/21/2018   Blood in urine 04/17/2018   Secondary open-angle glaucoma of left eye, moderate stage 03/10/2018   Early stage 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 10/05/2017   Epiretinal membrane (ERM) of left eye 10/05/2017   Pseudophakia of both eyes 10/05/2017   Osteopenia 03/19/2017   Mixed hyperlipidemia 08/18/2016   Generalized osteoarthritis of multiple sites 08/18/2016   Peripheral neuropathic pain 08/18/2016   Overactive bladder 08/18/2016   Glaucoma 08/18/2016   Cervical disc disorder with radiculopathy of cervical region 08/17/2016   Carpal tunnel syndrome, right upper limb 08/17/2016   Abnormal finding on mammography 06/14/2016   Breast lump 05/06/2015    PCP: Donnajean Lopes, MD  REFERRING PROVIDER: Donnajean Lopes, MD   REFERRING DIAG: Unsteadiness on feet [R26.81]   THERAPY DIAG:  Muscle weakness (generalized)  Other abnormalities of gait and mobility  Unsteadiness on feet  Rationale for Evaluation and Treatment: Rehabilitation  ONSET DATE: 2 years ago  SUBJECTIVE:   SUBJECTIVE STATEMENT: Pt denies any adverse effects after prior Rx.  Pt reports she has performed some of her HEP, but not as good as she should.  Pt states she has a hiatal hernia which affects her seated hip flexion exercises.  She has to wait 2 hours after she eats to perform that exercise.  Her neuropathy can sometimes affect her performance of exerciss. Pt denies pain currently.  Pt states she worked on walking with head turns at home with her great grandson walking with her.  Pt states we have made progress with sit/stand transfers.  She was able to stand up from the chairs at the dinner table without her family giving her looks or saying anything.         PERTINENT HISTORY: Arthritis, Osteopenia, Peripheral neuropathy, and bilat TKR. PAIN:  Are you having pain? No She  denies any knee or hip pain.   PRECAUTIONS: None  WEIGHT BEARING RESTRICTIONS: No  FALLS:  Has patient fallen in last 6 months? Yes. Number of falls 2  LIVING ENVIRONMENT: Lives with: lives alone Lives in: House/apartment Stairs: No Has following equipment at home: Single point cane, shower chair, and Grab bars  OCCUPATION: Retired  PLOF: Independent  PATIENT GOALS: Pt would like to improve her strength in bilat LE's and improve her balance.   NEXT MD VISIT:   OBJECTIVE:   DIAGNOSTIC FINDINGS: None at this time.     TODAY'S TREATMENT:  Gait:  increased foot slap bilat with decreased foot clearance bilat.      Ther Ex: Pt performed: Nustep L4 x5 mins UE/LE's  Lateral band walks with GTB x 3 laps at rail Standing heel raises 2x10 DF with RTB 3x10    Therapeutic Activities: Pt ascended stairs with a reciprocal gait with rail with SBA and descended stairs with a step thru gait with rail with SBA x 2 reps Sit to stands with GTB around thighs 2x10   Neuromuscular Re-education: Walking with head turns without AD with CGA down hallway x 2 laps Pt ambulating over 4 hurdles with CGA with a reciprocal gait with UE support Pt ambulated over 4 hurdles with a step to gait pattern without UE support with CGA and occasional min assist  Pt ambulated around 4 hurdles with 1 occasion of LOB using the rail for support      PATIENT EDUCATION:  Education details:  POC, exercise form, objective findings, and rationale. Person educated: Patient Education method: Customer service manager, verbal cues Education comprehension: verbalized understanding and returned demonstration, verbal cues required  HOME EXERCISE PROGRAM: Access Code: [email or web address] URL: [email or web address] Date: 11/10/2022 Prepared by: Ronny Flurry   ASSESSMENT:  CLINICAL IMPRESSION:  Pt reports continued improvement performance of sit/stand transfers though states her walking is the same.  She denies any improvement with her foot slap.  She has improved stability with gait and overall.  Pt does have short choppy steps with decreased toe off.  Pt has improved stability ambulating with head turns.  PT gave pt cues to increase step length.  Pt demonstrates improved performance and control with descending stairs.  She ambulated around 4 hurdles without UE support though had 1 occasion of LOB using the rail for support.  Pt responded well to Rx reporting no pain after Rx.     OBJECTIVE IMPAIRMENTS: decreased activity tolerance, difficulty walking, decreased balance, decreased endurance, decreased mobility, decreased ROM, decreased strength, impaired flexibility, impaired UE/LE use, postural dysfunction, and pain.  ACTIVITY LIMITATIONS: bending, lifting, carry, locomotion, cleaning, community activity, driving, and or occupation  PERSONAL FACTORS: Arthritis, Osteopenia, Peripheral neuropathy.  are also affecting patient's functional outcome.  REHAB POTENTIAL: Good  CLINICAL DECISION MAKING: Stable/uncomplicated  EVALUATION COMPLEXITY: Low   GOALS: Short term PT Goals Target date:11/29/2022 Pt will be I and compliant with HEP. Baseline:  Goal status: MET  Long term PT goals Target date: 02/13/2023 Pt will improve BERG by at least 3 points in order to demonstrate clinically significant improvement in balance.   Baseline:  Goal status: GOAL MET Pt will improve hip/knee strength to at least 5-/5 MMT to improve functional strength Baseline: Goal status: ONGOING Target dat:  03/28/23 Pt will report <1 fall during her duration in PT.         Baseline         Goal status: GOAL MET Pt will be able to ambulate community distances at least 1000 ft WNL gait pattern without complaints Baseline: Goal status: ONGOING Target dat:   03/28/23       5. Pt will be able to negotiate stairs with a reciprocal gait pattern       Baseline:        Goal status: PARTIALLY MET Target dat:  03/28/23       6. Pt will improve her STS by 2.3 seconds for Minimal clinically important difference.         Baseline:          Goal  status: GOAL MET      7.  Pt will be independent with advanced HEP to maximize functional strength gains and balance for improved mobility and decreased fall risk.     Goal status:  INITIAL Target dat:  03/28/23   PLAN: PT FREQUENCY: 1-2 times per week   PT DURATION: 4-6 weeks  PLANNED INTERVENTIONS (unless contraindicated): aquatic PT, Canalith repositioning, cryotherapy, Electrical stimulation, Iontophoresis with 4 mg/ml dexamethasome, Moist heat, traction, Ultrasound, gait training, Therapeutic exercise, balance training, neuromuscular re-education, patient/family education, prosthetic training, manual techniques, passive ROM, dry needling, taping, vasopnuematic device, vestibular, spinal manipulations, joint manipulations  PLAN FOR NEXT SESSION:  Cont with LE strengthening, balance, stair negotiation and gait.   Selinda Michaels III PT, DPT 03/07/23 5:20 PM

## 2023-03-08 ENCOUNTER — Ambulatory Visit (HOSPITAL_BASED_OUTPATIENT_CLINIC_OR_DEPARTMENT_OTHER): Payer: Medicare Other | Admitting: Physical Therapy

## 2023-03-08 ENCOUNTER — Encounter (HOSPITAL_BASED_OUTPATIENT_CLINIC_OR_DEPARTMENT_OTHER): Payer: Self-pay | Admitting: Physical Therapy

## 2023-03-08 DIAGNOSIS — M6281 Muscle weakness (generalized): Secondary | ICD-10-CM

## 2023-03-08 DIAGNOSIS — R2689 Other abnormalities of gait and mobility: Secondary | ICD-10-CM

## 2023-03-08 DIAGNOSIS — R2681 Unsteadiness on feet: Secondary | ICD-10-CM

## 2023-03-08 NOTE — Therapy (Signed)
OUTPATIENT PHYSICAL THERAPY LOWER EXTREMITY TREATMENT       Patient Name: Hailey Perkins MRN: [id number] DOB:1935/06/29, 88 y.o., female Today's Date: 03/09/2023  END OF SESSION:  PT End of Session - 03/08/23 1319     Visit Number 24    Number of Visits 28    Date for PT Re-Evaluation 03/28/23    Authorization Type [employer]    PT Start Time [id number]    PT Stop Time 1401    PT Time Calculation (min) 44 min    Equipment Utilized During Treatment Gait belt    Activity Tolerance Patient tolerated treatment well    Behavior During Therapy WFL for tasks assessed/performed                  Past Medical History:  Diagnosis Date   Aortic atherosclerosis    Arthritis    Cataract 2010   bilateral eyes   Chicken pox    Cholelithiasis    Diverticulitis    Diverticulosis    GERD (gastroesophageal reflux disease)    Glaucoma    Hiatal hernia    History of frequent urinary tract infections    Hyperlipidemia    Osteopenia    Peripheral neuropathy    Sleep apnea    wears a CPAP   Urine incontinence    Past Surgical History:  Procedure Laterality Date   ABDOMINAL HYSTERECTOMY  1977   ANAL FISTULECTOMY  1985   ANKLE FRACTURE SURGERY Right 2014   APPENDECTOMY     BIOPSY  02/15/2021   Procedure: BIOPSY;  Surgeon: Irving Copas., MD;  Location: [facility];  Service: Gastroenterology;;   BREAST BIOPSY Bilateral 10+ yrs ago   BENIGN   BUNIONECTOMY Left 1975   Left Toe   CATARACT EXTRACTION, BILATERAL  2009   COLONOSCOPY WITH PROPOFOL N/A 02/15/2021   Procedure: COLONOSCOPY WITH PROPOFOL;  Surgeon: Irving Copas., MD;  Location: [facility];  Service: Gastroenterology;  Laterality: N/A;  ultraslimscope    DILATION AND CURETTAGE OF UTERUS  1972   eye [place] vision correction Left 10/09/2013   eye socket re-formed Left 2014   FOOT NEUROMA SURGERY Right 2003   plus bunionectomy, right great toe   HAMMER TOE SURGERY Right 2005   Right  foot correction hammertoe little toe   HEMORRHOID SURGERY  1967   HIP SURGERY Right 2014   right hip femur pin implanted   KNEE ARTHROSCOPY Left 2010   debridement left knee scar tissue   LAPAROSCOPIC OOPHERECTOMY     1986, [place] Right 2003   Right Foot Neuroma, plus Bunionectomy, Right Great Toe   POLYPECTOMY  02/15/2021   Procedure: POLYPECTOMY;  Surgeon: Irving Copas., MD;  Location: [facility];  Service: Gastroenterology;;   [place] Left 01/2008   REPLACEMENT TOTAL KNEE Right 08/2008   retinal pucker correction Left 07/14/2012   schwannoma tumor (benign) removal  2011   right rib   [place]   Patient Active Problem List   Diagnosis Date Noted   Chronic pain of right knee 05/27/2021   OSA on CPAP 05/27/2021   LLQ pain 12/16/2020   History of diverticulitis 12/16/2020   Hematochezia 12/16/2020   Abnormal colonoscopy 12/16/2020   Diverticulitis of colon 08/15/2020   Sleep-related hypoxia 08/05/2019   OSA (obstructive  sleep apnea) 08/05/2019   PLMD (periodic limb movement disorder) 08/05/2019   Cerebellar 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 05/06/2019   Excessive daytime sleepiness 05/06/2019   Loud snoring 05/06/2019   Ventral hernia without obstruction or gangrene 02/22/2019   Chronic throat clearing 01/18/2019   Nocturnal cough 01/18/2019   Family history of colon cancer in father 01/18/2019   Diverticulosis 01/18/2019   Gait abnormality 12/27/2018   Numbness 12/27/2018   Status post total bilateral knee replacement 11/30/2018   DDD (degenerative disc disease), cervical 11/30/2018   Status post carpal tunnel release 11/30/2018   DDD (degenerative disc disease), lumbar 10/26/2018   Primary osteoarthritis of both hands 10/26/2018   Primary osteoarthritis of both feet 10/26/2018   Gastroesophageal reflux  disease 08/27/2018   Bilateral impacted cerumen 08/21/2018   Sensorineural hearing loss (SNHL) of both ears 08/21/2018   Tinnitus, bilateral 08/21/2018   Blood in urine 04/17/2018   Secondary open-angle glaucoma of left eye, moderate stage 03/10/2018   Early stage 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 10/05/2017   Epiretinal membrane (ERM) of left eye 10/05/2017   Pseudophakia of both eyes 10/05/2017   Osteopenia 03/19/2017   Mixed hyperlipidemia 08/18/2016   Generalized osteoarthritis of multiple sites 08/18/2016   Peripheral neuropathic pain 08/18/2016   Overactive bladder 08/18/2016   Glaucoma 08/18/2016   Cervical disc disorder with radiculopathy of cervical region 08/17/2016   Carpal tunnel syndrome, right upper limb 08/17/2016   Abnormal finding on mammography 06/14/2016   Breast lump 05/06/2015    PCP: Donnajean Lopes, MD  REFERRING PROVIDER: Donnajean Lopes, MD   REFERRING DIAG: Unsteadiness on feet [R26.81]   THERAPY DIAG:  Muscle weakness (generalized)  Other abnormalities of gait and mobility  Unsteadiness on feet  Rationale for Evaluation and Treatment: Rehabilitation  ONSET DATE: 2 years ago  SUBJECTIVE:   SUBJECTIVE STATEMENT: Pt stated she felt good after prior Rx.  Pt went to the aquatic exercise class this AM and performed some of her PT exercises in the pool.         PERTINENT HISTORY: Arthritis, Osteopenia, Peripheral neuropathy, and bilat TKR. PAIN:  Are you having pain? No She denies any knee or hip pain.   PRECAUTIONS: None  WEIGHT BEARING RESTRICTIONS: No  FALLS:  Has patient fallen in last 6 months? Yes. Number of falls 2  LIVING ENVIRONMENT: Lives with: lives alone Lives in: House/apartment Stairs: No Has following equipment at home: Single point cane, shower chair, and Grab bars  OCCUPATION: Retired  PLOF: Independent  PATIENT GOALS: Pt would like to improve her strength in bilat LE's and improve  her balance.   NEXT MD VISIT:   OBJECTIVE:   DIAGNOSTIC FINDINGS: None at this time.     TODAY'S TREATMENT:  Gait:  increased foot slap bilat with decreased toe off bilat.      Ther Ex: Pt performed: DF with RTB 3x10    Therapeutic Activities: Pt ascended stairs with a reciprocal gait with rail with SBA and descended stairs with a step thru gait with rail with SBA x 2 reps Sit to stands with GTB around thighs 2x10   Neuromuscular Re-education: Walking with head turns without AD with CGA down hallway x 2 laps Tandem stance 2x30 sec with UE support Tandem gait with CGA with UE support Head turns while standing on airex with CGA/min Assist 2x10 Pt ambulating over 4 hurdles with CGA with a reciprocal gait with UE support Pt ambulated over 4 hurdles with a step to gait pattern without UE support with CGA and occasional min assist  Pt ambulated around 4 hurdles with SBA/CGA      PATIENT EDUCATION:  Education details:  POC, exercise form, POC, and exercise rationale. Person educated: Patient Education method: Customer service manager, verbal cues Education comprehension: verbalized understanding and returned demonstration, verbal cues required  HOME EXERCISE PROGRAM: Access Code: [email or web address] URL: [email or web address] Date: 11/10/2022 Prepared by: Ronny Flurry   ASSESSMENT:  CLINICAL IMPRESSION:  Pt is improving with stability and balance.  She denies any improvement with her foot slap during gait.  She continues to have short, choppy steps with decreased toe off.  Pt has improved stability ambulating with head turns.  Pt is able to self correct LOB.  PT gave pt cues to increase step length.  Pt demonstrates improved performance and control with descending stairs.  She ambulated around 4 hurdles without UE support with good  stability overall.  She was able to self correct any LOB and unsteadiness.  Pt responded well to Rx having no c/o's after Rx.    OBJECTIVE IMPAIRMENTS: decreased activity tolerance, difficulty walking, decreased balance, decreased endurance, decreased mobility, decreased ROM, decreased strength, impaired flexibility, impaired UE/LE use, postural dysfunction, and pain.  ACTIVITY LIMITATIONS: bending, lifting, carry, locomotion, cleaning, community activity, driving, and or occupation  PERSONAL FACTORS: Arthritis, Osteopenia, Peripheral neuropathy.  are also affecting patient's functional outcome.  REHAB POTENTIAL: Good  CLINICAL DECISION MAKING: Stable/uncomplicated  EVALUATION COMPLEXITY: Low   GOALS: Short term PT Goals Target date:11/29/2022 Pt will be I and compliant with HEP. Baseline:  Goal status: MET  Long term PT goals Target date: 02/13/2023 Pt will improve BERG by at least 3 points in order to demonstrate clinically significant improvement in balance.   Baseline:  Goal status: GOAL MET Pt will improve hip/knee strength to at least 5-/5 MMT to improve functional strength Baseline: Goal status: ONGOING Target dat:  03/28/23 Pt will report <1 fall during her duration in PT.         Baseline         Goal status: GOAL MET Pt will be able to ambulate community distances at least 1000 ft WNL gait pattern without complaints Baseline: Goal status: ONGOING Target dat:  03/28/23       5. Pt will be able to negotiate stairs with a reciprocal gait pattern       Baseline:        Goal status: PARTIALLY MET Target dat:  03/28/23       6. Pt will improve her STS by 2.3 seconds for Minimal clinically important difference.         Baseline:          Goal status: GOAL MET  7.  Pt will be independent with advanced HEP to maximize functional strength gains and balance for improved mobility and decreased fall risk.     Goal status:  INITIAL Target dat:  03/28/23   PLAN: PT  FREQUENCY: 1-2 times per week   PT DURATION: 4-6 weeks  PLANNED INTERVENTIONS (unless contraindicated): aquatic PT, Canalith repositioning, cryotherapy, Electrical stimulation, Iontophoresis with 4 mg/ml dexamethasome, Moist heat, traction, Ultrasound, gait training, Therapeutic exercise, balance training, neuromuscular re-education, patient/family education, prosthetic training, manual techniques, passive ROM, dry needling, taping, vasopnuematic device, vestibular, spinal manipulations, joint manipulations  PLAN FOR NEXT SESSION:  Cont with LE strengthening, balance, stair negotiation and gait.   Selinda Michaels III PT, DPT 03/09/23 6:16 PM

## 2023-03-13 ENCOUNTER — Ambulatory Visit (HOSPITAL_BASED_OUTPATIENT_CLINIC_OR_DEPARTMENT_OTHER): Payer: Medicare Other | Admitting: Physical Therapy

## 2023-03-13 DIAGNOSIS — R2689 Other abnormalities of gait and mobility: Secondary | ICD-10-CM

## 2023-03-13 DIAGNOSIS — R2681 Unsteadiness on feet: Secondary | ICD-10-CM

## 2023-03-13 DIAGNOSIS — M6281 Muscle weakness (generalized): Secondary | ICD-10-CM

## 2023-03-13 NOTE — Therapy (Signed)
OUTPATIENT PHYSICAL THERAPY LOWER EXTREMITY TREATMENT       Patient Name: Hailey Perkins MRN: [id number] DOB:12/15/1934, 88 y.o., female Today's Date: 03/14/2023  END OF SESSION:  PT End of Session - 03/13/23 1154     Visit Number 25    Number of Visits 28    Date for PT Re-Evaluation 03/28/23    Authorization Type [employer]    PT Start Time 1112    PT Stop Time 1152    PT Time Calculation (min) 40 min    Equipment Utilized During Treatment Gait belt    Activity Tolerance Patient tolerated treatment well    Behavior During Therapy WFL for tasks assessed/performed                   Past Medical History:  Diagnosis Date   Aortic atherosclerosis    Arthritis    Cataract 2010   bilateral eyes   Chicken pox    Cholelithiasis    Diverticulitis    Diverticulosis    GERD (gastroesophageal reflux disease)    Glaucoma    Hiatal hernia    History of frequent urinary tract infections    Hyperlipidemia    Osteopenia    Peripheral neuropathy    Sleep apnea    wears a CPAP   Urine incontinence    Past Surgical History:  Procedure Laterality Date   ABDOMINAL HYSTERECTOMY  1977   ANAL FISTULECTOMY  1985   ANKLE FRACTURE SURGERY Right 2014   APPENDECTOMY     BIOPSY  02/15/2021   Procedure: BIOPSY;  Surgeon: Lemar Lofty., MD;  Location: [facility];  Service: Gastroenterology;;   BREAST BIOPSY Bilateral 10+ yrs ago   BENIGN   BUNIONECTOMY Left 1975   Left Toe   CATARACT EXTRACTION, BILATERAL  2009   COLONOSCOPY WITH PROPOFOL N/A 02/15/2021   Procedure: COLONOSCOPY WITH PROPOFOL;  Surgeon: Lemar Lofty., MD;  Location: [facility];  Service: Gastroenterology;  Laterality: N/A;  ultraslimscope    DILATION AND CURETTAGE OF UTERUS  1972   eye PRK vision correction Left 10/09/2013   eye socket re-formed Left 2014   FOOT NEUROMA SURGERY Right 2003   plus bunionectomy, right great toe   HAMMER TOE SURGERY Right 2005   Right  foot correction hammertoe little toe   HEMORRHOID SURGERY  1967   HIP SURGERY Right 2014   right hip femur pin implanted   KNEE ARTHROSCOPY Left 2010   debridement left knee scar tissue   LAPAROSCOPIC OOPHERECTOMY     1986, 1990   LASIK  1998   NEUROMA SURGERY Right 2003   Right Foot Neuroma, plus Bunionectomy, Right Great Toe   POLYPECTOMY  02/15/2021   Procedure: POLYPECTOMY;  Surgeon: Lemar Lofty., MD;  Location: [facility];  Service: Gastroenterology;;   RECTOCELE REPAIR  1968   REPLACEMENT TOTAL KNEE Left 01/2008   REPLACEMENT TOTAL KNEE Right 08/2008   retinal pucker correction Left 07/14/2012   schwannoma tumor (benign) removal  2011   right rib   SIGMOIDOSCOPY     TONSILLECTOMY AND ADENOIDECTOMY     VENTRAL HERNIA REPAIR  1993   Patient Active Problem List   Diagnosis Date Noted   Chronic pain of right knee 05/27/2021   OSA on CPAP 05/27/2021   LLQ pain 12/16/2020   History of diverticulitis 12/16/2020   Hematochezia 12/16/2020   Abnormal colonoscopy 12/16/2020   Diverticulitis of colon 08/15/2020   Sleep-related hypoxia 08/05/2019   OSA (  obstructive sleep apnea) 08/05/2019   PLMD (periodic limb movement disorder) 08/05/2019   Cerebellar 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 05/06/2019   Excessive daytime sleepiness 05/06/2019   Loud snoring 05/06/2019   Ventral hernia without obstruction or gangrene 02/22/2019   Chronic throat clearing 01/18/2019   Nocturnal cough 01/18/2019   Family history of colon cancer in father 01/18/2019   Diverticulosis 01/18/2019   Gait abnormality 12/27/2018   Numbness 12/27/2018   Status post total bilateral knee replacement 11/30/2018   DDD (degenerative disc disease), cervical 11/30/2018   Status post carpal tunnel release 11/30/2018   DDD (degenerative disc disease), lumbar 10/26/2018   Primary osteoarthritis of both hands 10/26/2018   Primary osteoarthritis of both feet 10/26/2018   Gastroesophageal reflux  disease 08/27/2018   Bilateral impacted cerumen 08/21/2018   Sensorineural hearing loss (SNHL) of both ears 08/21/2018   Tinnitus, bilateral 08/21/2018   Blood in urine 04/17/2018   Secondary open-angle glaucoma of left eye, moderate stage 03/10/2018   Early stage 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 10/05/2017   Epiretinal membrane (ERM) of left eye 10/05/2017   Pseudophakia of both eyes 10/05/2017   Osteopenia 03/19/2017   Mixed hyperlipidemia 08/18/2016   Generalized osteoarthritis of multiple sites 08/18/2016   Peripheral neuropathic pain 08/18/2016   Overactive bladder 08/18/2016   Glaucoma 08/18/2016   Cervical disc disorder with radiculopathy of cervical region 08/17/2016   Carpal tunnel syndrome, right upper limb 08/17/2016   Abnormal finding on mammography 06/14/2016   Breast lump 05/06/2015    PCP: Garlan Fillers, MD  REFERRING PROVIDER: Garlan Fillers, MD   REFERRING DIAG: Unsteadiness on feet [R26.81]   THERAPY DIAG:  Muscle weakness (generalized)  Other abnormalities of gait and mobility  Unsteadiness on feet  Rationale for Evaluation and Treatment: Rehabilitation  ONSET DATE: 2 years ago  SUBJECTIVE:   SUBJECTIVE STATEMENT: Pt stated she felt good after prior Rx.  Pt states she gets a twinge in her R knee at times though has no pain currently.  Pt went to the aquatic exercise class this AM and performed some of her PT exercises in the pool.  She was challenged with her balance exercises.  Pt states she was able to perform sit to stand transfers much easier from the chairs at her dinner table.  Pt states her walking/foot slap is about the same.       PERTINENT HISTORY: Arthritis, Osteopenia, Peripheral neuropathy, and bilat TKR. PAIN:  Are you having pain? No She denies any knee or hip pain.   PRECAUTIONS: None  WEIGHT BEARING RESTRICTIONS: No  FALLS:  Has patient fallen in last 6 months? Yes. Number of falls  2  LIVING ENVIRONMENT: Lives with: lives alone Lives in: House/apartment Stairs: No Has following equipment at home: Single point cane, shower chair, and Grab bars  OCCUPATION: Retired  PLOF: Independent  PATIENT GOALS: Pt would like to improve her strength in bilat LE's and improve her balance.   NEXT MD VISIT:   OBJECTIVE:   DIAGNOSTIC FINDINGS: None at this time.     TODAY'S TREATMENT:  Gait:  increased foot slap bilat with decreased toe off bilat.      Ther Ex: Pt performed: Nustep L4 x 5 mins Ues/Les  DF with RTB 3x10 Heel raises 2x10   Therapeutic Activities: Pt ascended stairs with a reciprocal gait with rail with SBA and descended stairs with a step thru gait with rail with SBA x 2 reps Sit to stands with GTB around thighs 2x10   Neuromuscular Re-education: Walking with head turns without AD with CGA down hallway x 2 laps Pt ambulated with verbal commands "fast/slow" to work on changing gait speed. Pt ambulating over 4 hurdles with CGA with a reciprocal gait with UE support Pt ambulated around 4 hurdles with SBA/CGA      PATIENT EDUCATION:  Education details:  POC, exercise form, POC, and exercise rationale. Person educated: Patient Education method: Medical illustratorxplanation and Demonstration, verbal cues Education comprehension: verbalized understanding and returned demonstration, verbal cues required  HOME EXERCISE PROGRAM: Access Code: [email or web address] URL: [email or web address] Date: 11/10/2022 Prepared by: Aaron Edelmanrey Tamaj Jurgens   ASSESSMENT:  CLINICAL IMPRESSION:  Pt states she was able to perform sit to stand transfers much easier from the chairs at her dinner table.  She hasn't improved in foot slap or short choppy steps though has improved with stability with gait.  PT worked on taking longer steps with ambulation.  She  continues to have decreased DF ROM.  PT added ambulating with changing speed today and Pt has decreased gait speed.  Pt is improving with control on stairs and does require UE support on rail.   Pt responded well to Rx reporting no c/o's after Rx.    OBJECTIVE IMPAIRMENTS: decreased activity tolerance, difficulty walking, decreased balance, decreased endurance, decreased mobility, decreased ROM, decreased strength, impaired flexibility, impaired UE/LE use, postural dysfunction, and pain.  ACTIVITY LIMITATIONS: bending, lifting, carry, locomotion, cleaning, community activity, driving, and or occupation  PERSONAL FACTORS: Arthritis, Osteopenia, Peripheral neuropathy.  are also affecting patient's functional outcome.  REHAB POTENTIAL: Good  CLINICAL DECISION MAKING: Stable/uncomplicated  EVALUATION COMPLEXITY: Low   GOALS: Short term PT Goals Target date:11/29/2022 Pt will be I and compliant with HEP. Baseline:  Goal status: MET  Long term PT goals Target date: 02/13/2023 Pt will improve BERG by at least 3 points in order to demonstrate clinically significant improvement in balance.   Baseline:  Goal status: GOAL MET Pt will improve hip/knee strength to at least 5-/5 MMT to improve functional strength Baseline: Goal status: ONGOING Target dat:  03/28/23 Pt will report <1 fall during her duration in PT.         Baseline         Goal status: GOAL MET Pt will be able to ambulate community distances at least 1000 ft WNL gait pattern without complaints Baseline: Goal status: ONGOING Target dat:  03/28/23       5. Pt will be able to negotiate stairs with a reciprocal gait pattern       Baseline:        Goal status: PARTIALLY MET Target dat:  03/28/23       6. Pt will improve her STS by 2.3 seconds for Minimal clinically important difference.         Baseline:          Goal status: GOAL MET      7.  Pt will be independent with advanced HEP to maximize functional strength gains and  balance for improved mobility and decreased fall risk.     Goal  status:  INITIAL Target dat:  03/28/23   PLAN: PT FREQUENCY: 1-2 times per week   PT DURATION: 4-6 weeks  PLANNED INTERVENTIONS (unless contraindicated): aquatic PT, Canalith repositioning, cryotherapy, Electrical stimulation, Iontophoresis with 4 mg/ml dexamethasome, Moist heat, traction, Ultrasound, gait training, Therapeutic exercise, balance training, neuromuscular re-education, patient/family education, prosthetic training, manual techniques, passive ROM, dry needling, taping, vasopnuematic device, vestibular, spinal manipulations, joint manipulations  PLAN FOR NEXT SESSION:  Cont with LE strengthening, balance, stair negotiation and gait.   Audie Clear III PT, DPT 03/14/23 5:05 PM

## 2023-03-14 ENCOUNTER — Encounter (HOSPITAL_BASED_OUTPATIENT_CLINIC_OR_DEPARTMENT_OTHER): Payer: Self-pay | Admitting: Physical Therapy

## 2023-03-15 ENCOUNTER — Encounter (HOSPITAL_BASED_OUTPATIENT_CLINIC_OR_DEPARTMENT_OTHER): Payer: Self-pay | Admitting: Physical Therapy

## 2023-03-15 ENCOUNTER — Ambulatory Visit (HOSPITAL_BASED_OUTPATIENT_CLINIC_OR_DEPARTMENT_OTHER): Payer: Medicare Other | Admitting: Physical Therapy

## 2023-03-15 DIAGNOSIS — M6281 Muscle weakness (generalized): Secondary | ICD-10-CM

## 2023-03-15 DIAGNOSIS — R2689 Other abnormalities of gait and mobility: Secondary | ICD-10-CM

## 2023-03-15 DIAGNOSIS — R2681 Unsteadiness on feet: Secondary | ICD-10-CM

## 2023-03-15 NOTE — Therapy (Signed)
OUTPATIENT PHYSICAL THERAPY LOWER EXTREMITY TREATMENT       Patient Name: Hailey Perkins MRN: [id number] DOB:1935-08-23, 88 y.o., female Today's Date: 03/16/2023  END OF SESSION:  PT End of Session - 03/15/23 1115     Visit Number 26    Number of Visits 28    Date for PT Re-Evaluation 03/28/23    Authorization Type [employer]    PT Start Time 1110    PT Stop Time 1149    PT Time Calculation (min) 39 min    Equipment Utilized During Treatment Gait belt    Activity Tolerance Patient tolerated treatment well    Behavior During Therapy WFL for tasks assessed/performed                   Past Medical History:  Diagnosis Date   Aortic atherosclerosis    Arthritis    Cataract 2010   bilateral eyes   Chicken pox    Cholelithiasis    Diverticulitis    Diverticulosis    GERD (gastroesophageal reflux disease)    Glaucoma    Hiatal hernia    History of frequent urinary tract infections    Hyperlipidemia    Osteopenia    Peripheral neuropathy    Sleep apnea    wears a CPAP   Urine incontinence    Past Surgical History:  Procedure Laterality Date   ABDOMINAL HYSTERECTOMY  1977   ANAL FISTULECTOMY  1985   ANKLE FRACTURE SURGERY Right 2014   APPENDECTOMY     BIOPSY  02/15/2021   Procedure: BIOPSY;  Surgeon: Lemar Lofty., MD;  Location: [facility];  Service: Gastroenterology;;   BREAST BIOPSY Bilateral 10+ yrs ago   BENIGN   BUNIONECTOMY Left 1975   Left Toe   CATARACT EXTRACTION, BILATERAL  2009   COLONOSCOPY WITH PROPOFOL N/A 02/15/2021   Procedure: COLONOSCOPY WITH PROPOFOL;  Surgeon: Lemar Lofty., MD;  Location: [facility];  Service: Gastroenterology;  Laterality: N/A;  ultraslimscope    DILATION AND CURETTAGE OF UTERUS  1972   eye PRK vision correction Left 10/09/2013   eye socket re-formed Left 2014   FOOT NEUROMA SURGERY Right 2003   plus bunionectomy, right great toe   HAMMER TOE SURGERY Right 2005   Right  foot correction hammertoe little toe   HEMORRHOID SURGERY  1967   HIP SURGERY Right 2014   right hip femur pin implanted   KNEE ARTHROSCOPY Left 2010   debridement left knee scar tissue   LAPAROSCOPIC OOPHERECTOMY     1986, 1990   LASIK  1998   NEUROMA SURGERY Right 2003   Right Foot Neuroma, plus Bunionectomy, Right Great Toe   POLYPECTOMY  02/15/2021   Procedure: POLYPECTOMY;  Surgeon: Lemar Lofty., MD;  Location: [facility];  Service: Gastroenterology;;   RECTOCELE REPAIR  1968   REPLACEMENT TOTAL KNEE Left 01/2008   REPLACEMENT TOTAL KNEE Right 08/2008   retinal pucker correction Left 07/14/2012   schwannoma tumor (benign) removal  2011   right rib   SIGMOIDOSCOPY     TONSILLECTOMY AND ADENOIDECTOMY     VENTRAL HERNIA REPAIR  1993   Patient Active Problem List   Diagnosis Date Noted   Chronic pain of right knee 05/27/2021   OSA on CPAP 05/27/2021   LLQ pain 12/16/2020   History of diverticulitis 12/16/2020   Hematochezia 12/16/2020   Abnormal colonoscopy 12/16/2020   Diverticulitis of colon 08/15/2020   Sleep-related hypoxia 08/05/2019   OSA (  obstructive sleep apnea) 08/05/2019   PLMD (periodic limb movement disorder) 08/05/2019   Cerebellar 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 05/06/2019   Excessive daytime sleepiness 05/06/2019   Loud snoring 05/06/2019   Ventral hernia without obstruction or gangrene 02/22/2019   Chronic throat clearing 01/18/2019   Nocturnal cough 01/18/2019   Family history of colon cancer in father 01/18/2019   Diverticulosis 01/18/2019   Gait abnormality 12/27/2018   Numbness 12/27/2018   Status post total bilateral knee replacement 11/30/2018   DDD (degenerative disc disease), cervical 11/30/2018   Status post carpal tunnel release 11/30/2018   DDD (degenerative disc disease), lumbar 10/26/2018   Primary osteoarthritis of both hands 10/26/2018   Primary osteoarthritis of both feet 10/26/2018   Gastroesophageal reflux  disease 08/27/2018   Bilateral impacted cerumen 08/21/2018   Sensorineural hearing loss (SNHL) of both ears 08/21/2018   Tinnitus, bilateral 08/21/2018   Blood in urine 04/17/2018   Secondary open-angle glaucoma of left eye, moderate stage 03/10/2018   Early stage 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 10/05/2017   Epiretinal membrane (ERM) of left eye 10/05/2017   Pseudophakia of both eyes 10/05/2017   Osteopenia 03/19/2017   Mixed hyperlipidemia 08/18/2016   Generalized osteoarthritis of multiple sites 08/18/2016   Peripheral neuropathic pain 08/18/2016   Overactive bladder 08/18/2016   Glaucoma 08/18/2016   Cervical disc disorder with radiculopathy of cervical region 08/17/2016   Carpal tunnel syndrome, right upper limb 08/17/2016   Abnormal finding on mammography 06/14/2016   Breast lump 05/06/2015    PCP: Garlan Fillers, MD  REFERRING PROVIDER: Garlan Fillers, MD   REFERRING DIAG: Unsteadiness on feet [R26.81]   THERAPY DIAG:  Muscle weakness (generalized)  Other abnormalities of gait and mobility  Unsteadiness on feet  Rationale for Evaluation and Treatment: Rehabilitation  ONSET DATE: 2 years ago  SUBJECTIVE:   SUBJECTIVE STATEMENT: Pt denies any adverse effects after prior Rx though was tired and took a nap later.  Pt stated a lady in her aquatic exercise class told her she was walking better this morning.  Pt states her R knee did bother her some last night.  She used an anti-inflammatory cream on her knee which made it feel better.  She has no pain in knee this AM.  Pt states her walking/foot slap is about the same.       PERTINENT HISTORY: Arthritis, Osteopenia, Peripheral neuropathy, and bilat TKR. PAIN:  Are you having pain? No Pt denies any knee or hip pain.   PRECAUTIONS: None  WEIGHT BEARING RESTRICTIONS: No  FALLS:  Has patient fallen in last 6 months? Yes. Number of falls 2  LIVING ENVIRONMENT: Lives with: lives  alone Lives in: House/apartment Stairs: No Has following equipment at home: Single point cane, shower chair, and Grab bars  OCCUPATION: Retired  PLOF: Independent  PATIENT GOALS: Pt would like to improve her strength in bilat LE's and improve her balance.   NEXT MD VISIT:   OBJECTIVE:   DIAGNOSTIC FINDINGS: None at this time.     TODAY'S TREATMENT:  Gait:  increased foot slap bilat with decreased toe off bilat.      Ther Ex: Pt performed: Nustep L4 x 5 mins Ues/Les  DF with RTB 3x10 Heel raises 2x10   Therapeutic Activities: Pt ascended stairs with a reciprocal gait with rail with SBA and descended stairs with a step thru gait with rail with SBA x 2 reps Sit to stands with GTB around thighs 2x10   Neuromuscular Re-education: Walking with head turns without AD with CGA down hallway x 2 laps Pt ambulated with verbal commands "fast/slow" to work on changing gait speed. Pt ambulating over 4 hurdles with CGA with a reciprocal gait with UE support Pt ambulated around 4 hurdles with SBA/CGA      PATIENT EDUCATION:  Education details:  POC, exercise form, POC, and exercise rationale. Person educated: Patient Education method: Medical illustratorxplanation and Demonstration, verbal cues Education comprehension: verbalized understanding and returned demonstration, verbal cues required  HOME EXERCISE PROGRAM: Access Code: [email or web address] URL: [email or web address] Date: 11/10/2022 Prepared by: Aaron Edelmanrey Cleotilde Spadaccini   ASSESSMENT:  CLINICAL IMPRESSION:  Pt continues to ambulate with short choppy steps, decreased toe off, and foot slap though has improved with stability with gait.  She continues to have decreased DF ROM.  Pt performed ambulation with changing speed and Pt has decreased gait speed.  PT provided cues to improve quality of gait.  Pt has minimal  LOB's with ambulation with changing speed and ambulation with head turns though is able to correct her LOB.  Pt gives good effort with all exercises and activities.  Pt responded well to Rx reporting no c/o's after Rx.    OBJECTIVE IMPAIRMENTS: decreased activity tolerance, difficulty walking, decreased balance, decreased endurance, decreased mobility, decreased ROM, decreased strength, impaired flexibility, impaired UE/LE use, postural dysfunction, and pain.  ACTIVITY LIMITATIONS: bending, lifting, carry, locomotion, cleaning, community activity, driving, and or occupation  PERSONAL FACTORS: Arthritis, Osteopenia, Peripheral neuropathy.  are also affecting patient's functional outcome.  REHAB POTENTIAL: Good  CLINICAL DECISION MAKING: Stable/uncomplicated  EVALUATION COMPLEXITY: Low   GOALS: Short term PT Goals Target date:11/29/2022 Pt will be I and compliant with HEP. Baseline:  Goal status: MET  Long term PT goals Target date: 02/13/2023 Pt will improve BERG by at least 3 points in order to demonstrate clinically significant improvement in balance.   Baseline:  Goal status: GOAL MET Pt will improve hip/knee strength to at least 5-/5 MMT to improve functional strength Baseline: Goal status: ONGOING Target dat:  03/28/23 Pt will report <1 fall during her duration in PT.         Baseline         Goal status: GOAL MET Pt will be able to ambulate community distances at least 1000 ft WNL gait pattern without complaints Baseline: Goal status: ONGOING Target dat:  03/28/23       5. Pt will be able to negotiate stairs with a reciprocal gait pattern       Baseline:        Goal status: PARTIALLY MET Target dat:  03/28/23       6. Pt will improve her STS by 2.3 seconds for Minimal clinically important difference.         Baseline:          Goal status: GOAL MET      7.  Pt will be independent with advanced HEP to maximize functional strength gains and balance for improved mobility and  decreased fall risk.     Goal status:  INITIAL Target dat:  03/28/23   PLAN: PT FREQUENCY: 1-2 times per week   PT DURATION: 4-6 weeks  PLANNED INTERVENTIONS (unless contraindicated): aquatic PT, Canalith repositioning, cryotherapy, Electrical stimulation, Iontophoresis with 4 mg/ml dexamethasome, Moist heat, traction, Ultrasound, gait training, Therapeutic exercise, balance training, neuromuscular re-education, patient/family education, prosthetic training, manual techniques, passive ROM, dry needling, taping, vasopnuematic device, vestibular, spinal manipulations, joint manipulations  PLAN FOR NEXT SESSION:  Cont with LE strengthening, balance, stair negotiation and gait.   Audie Clear III PT, DPT 03/16/23 5:37 PM

## 2023-03-22 ENCOUNTER — Encounter (HOSPITAL_BASED_OUTPATIENT_CLINIC_OR_DEPARTMENT_OTHER): Payer: Self-pay | Admitting: Physical Therapy

## 2023-03-22 ENCOUNTER — Ambulatory Visit (HOSPITAL_BASED_OUTPATIENT_CLINIC_OR_DEPARTMENT_OTHER): Payer: Medicare Other | Admitting: Physical Therapy

## 2023-03-22 DIAGNOSIS — M6281 Muscle weakness (generalized): Secondary | ICD-10-CM | POA: Diagnosis not present

## 2023-03-22 DIAGNOSIS — R2689 Other abnormalities of gait and mobility: Secondary | ICD-10-CM

## 2023-03-22 DIAGNOSIS — R2681 Unsteadiness on feet: Secondary | ICD-10-CM

## 2023-03-22 NOTE — Therapy (Signed)
OUTPATIENT PHYSICAL THERAPY LOWER EXTREMITY TREATMENT       Patient Name: Hailey Perkins MRN: [id number] DOB:Jul 26, 1935, 88 y.o., female Today's Date: 03/16/2023  END OF SESSION:  PT End of Session - 03/22/23      Visit Number 27   Number of Visits 28    Date for PT Re-Evaluation 03/28/23    Authorization Type [employer]    PT Start Time 1024   PT Stop Time 1104    PT Time Calculation (min) 40 min    Equipment Utilized During Treatment Gait belt    Activity Tolerance Patient tolerated treatment well    Behavior During Therapy WFL for tasks assessed/performed                   Past Medical History:  Diagnosis Date   Aortic atherosclerosis    Arthritis    Cataract 2010   bilateral eyes   Chicken pox    Cholelithiasis    Diverticulitis    Diverticulosis    GERD (gastroesophageal reflux disease)    Glaucoma    Hiatal hernia    History of frequent urinary tract infections    Hyperlipidemia    Osteopenia    Peripheral neuropathy    Sleep apnea    wears a CPAP   Urine incontinence    Past Surgical History:  Procedure Laterality Date   ABDOMINAL HYSTERECTOMY  1977   ANAL FISTULECTOMY  1985   ANKLE FRACTURE SURGERY Right 2014   APPENDECTOMY     BIOPSY  02/15/2021   Procedure: BIOPSY;  Surgeon: Lemar Lofty., MD;  Location: [facility];  Service: Gastroenterology;;   BREAST BIOPSY Bilateral 10+ yrs ago   BENIGN   BUNIONECTOMY Left 1975   Left Toe   CATARACT EXTRACTION, BILATERAL  2009   COLONOSCOPY WITH PROPOFOL N/A 02/15/2021   Procedure: COLONOSCOPY WITH PROPOFOL;  Surgeon: Lemar Lofty., MD;  Location: [facility];  Service: Gastroenterology;  Laterality: N/A;  ultraslimscope    DILATION AND CURETTAGE OF UTERUS  1972   eye PRK vision correction Left 10/09/2013   eye socket re-formed Left 2014   FOOT NEUROMA SURGERY Right 2003   plus bunionectomy, right great toe   HAMMER TOE SURGERY Right 2005   Right foot  correction hammertoe little toe   HEMORRHOID SURGERY  1967   HIP SURGERY Right 2014   right hip femur pin implanted   KNEE ARTHROSCOPY Left 2010   debridement left knee scar tissue   LAPAROSCOPIC OOPHERECTOMY     1986, 1990   LASIK  1998   NEUROMA SURGERY Right 2003   Right Foot Neuroma, plus Bunionectomy, Right Great Toe   POLYPECTOMY  02/15/2021   Procedure: POLYPECTOMY;  Surgeon: Mansouraty, Netty Starring., MD;  Location: [facility];  Service: Gastroenterology;;   RECTOCELE REPAIR  1968   REPLACEMENT TOTAL KNEE Left 01/2008   REPLACEMENT TOTAL KNEE Right 08/2008   retinal pucker correction Left 07/14/2012   schwannoma tumor (benign) removal  2011   right rib   SIGMOIDOSCOPY     TONSILLECTOMY AND ADENOIDECTOMY     VENTRAL HERNIA REPAIR  1993   Patient Active Problem List   Diagnosis Date Noted   Chronic pain of right knee 05/27/2021   OSA on CPAP 05/27/2021   LLQ pain 12/16/2020   History of diverticulitis 12/16/2020   Hematochezia 12/16/2020   Abnormal colonoscopy 12/16/2020   Diverticulitis of colon 08/15/2020   Sleep-related hypoxia 08/05/2019   OSA (obstructive sleep  apnea) 08/05/2019   PLMD (periodic limb movement disorder) 08/05/2019   Cerebellar 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 05/06/2019   Excessive daytime sleepiness 05/06/2019   Loud snoring 05/06/2019   Ventral hernia without obstruction or gangrene 02/22/2019   Chronic throat clearing 01/18/2019   Nocturnal cough 01/18/2019   Family history of colon cancer in father 01/18/2019   Diverticulosis 01/18/2019   Gait abnormality 12/27/2018   Numbness 12/27/2018   Status post total bilateral knee replacement 11/30/2018   DDD (degenerative disc disease), cervical 11/30/2018   Status post carpal tunnel release 11/30/2018   DDD (degenerative disc disease), lumbar 10/26/2018   Primary osteoarthritis of both hands 10/26/2018   Primary osteoarthritis of both feet 10/26/2018   Gastroesophageal reflux disease  08/27/2018   Bilateral impacted cerumen 08/21/2018   Sensorineural hearing loss (SNHL) of both ears 08/21/2018   Tinnitus, bilateral 08/21/2018   Blood in urine 04/17/2018   Secondary open-angle glaucoma of left eye, moderate stage 03/10/2018   Early stage 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 10/05/2017   Epiretinal membrane (ERM) of left eye 10/05/2017   Pseudophakia of both eyes 10/05/2017   Osteopenia 03/19/2017   Mixed hyperlipidemia 08/18/2016   Generalized osteoarthritis of multiple sites 08/18/2016   Peripheral neuropathic pain 08/18/2016   Overactive bladder 08/18/2016   Glaucoma 08/18/2016   Cervical disc disorder with radiculopathy of cervical region 08/17/2016   Carpal tunnel syndrome, right upper limb 08/17/2016   Abnormal finding on mammography 06/14/2016   Breast lump 05/06/2015    PCP: Garlan Fillers, MD  REFERRING PROVIDER: Garlan Fillers, MD   REFERRING DIAG: Unsteadiness on feet [R26.81]   THERAPY DIAG:  Muscle weakness (generalized)  Other abnormalities of gait and mobility  Unsteadiness on feet  Rationale for Evaluation and Treatment: Rehabilitation  ONSET DATE: 2 years ago  SUBJECTIVE:   SUBJECTIVE STATEMENT: Pt denies any adverse effects after prior Rx.  Pt wonders why she still has the foot slap with gait.  Pt has been performing aquatic exercise class and performing some of her PT exercises in the pool.         PERTINENT HISTORY: Arthritis, Osteopenia, Peripheral neuropathy, and bilat TKR. PAIN:  Are you having pain? No Pt denies any knee or hip pain.   PRECAUTIONS: None  WEIGHT BEARING RESTRICTIONS: No  FALLS:  Has patient fallen in last 6 months? Yes. Number of falls 2  LIVING ENVIRONMENT: Lives with: lives alone Lives in: House/apartment Stairs: No Has following equipment at home: Single point cane, shower chair, and Grab bars  OCCUPATION: Retired  PLOF: Independent  PATIENT GOALS: Pt would  like to improve her strength in bilat LE's and improve her balance.   NEXT MD VISIT:   OBJECTIVE:   DIAGNOSTIC FINDINGS: None at this time.     TODAY'S TREATMENT:  Gait:  increased foot slap bilat with decreased toe off bilat.    Ankle DF strength:  R:  4-/5, L: 4/5     Ther Ex: Pt performed: Nustep L4 x 5 mins Ues/Les  DF with RTB 3x10  PT spent extensive time answering pt's questions and educating pt concerning gait, foot slap, and decreased DF strength.  Pt also educated concerning POC.   Pt ascended stairs with a reciprocal gait with rail with SBA and descended stairs with a step thru gait with rail with SBA x 2 reps    Neuromuscular Re-education: Walking with head turns without AD with CGA/SBA with one occasion of mod assist down hallway x 2 laps Pt ambulating over 4 hurdles with CGA with a reciprocal gait with UE support Pt ambulated around 4 hurdles with SBA/CGA      PATIENT EDUCATION:  Education details:  POC, exercise form, POC, and exercise rationale. Person educated: Patient Education method: Medical illustrator, verbal cues Education comprehension: verbalized understanding and returned demonstration, verbal cues required  HOME EXERCISE PROGRAM: Access Code: [email or web address] URL: [email or web address] Date: 11/10/2022 Prepared by: Aaron Edelman   ASSESSMENT:  CLINICAL IMPRESSION:  Pt continues to have the same gait deficits including short choppy steps, decreased toe off, and foot slap though has overall improved stability with gait.    She continues to have weakness in DF.  PT assessed DF strength.  She had no change in DF strength though overall minimal increase in L DF strength.  Pt had one occasion of LOB ambulating with head turns which required mod assistance for LOB.  Pt states her left foot caught on  the floor.  Pt gives good effort with all exercises and activities.  Pt responded well to Rx reporting no c/o's after Rx.        OBJECTIVE IMPAIRMENTS: decreased activity tolerance, difficulty walking, decreased balance, decreased endurance, decreased mobility, decreased ROM, decreased strength, impaired flexibility, impaired UE/LE use, postural dysfunction, and pain.  ACTIVITY LIMITATIONS: bending, lifting, carry, locomotion, cleaning, community activity, driving, and or occupation  PERSONAL FACTORS: Arthritis, Osteopenia, Peripheral neuropathy.  are also affecting patient's functional outcome.  REHAB POTENTIAL: Good  CLINICAL DECISION MAKING: Stable/uncomplicated  EVALUATION COMPLEXITY: Low   GOALS: Short term PT Goals Target date:11/29/2022 Pt will be I and compliant with HEP. Baseline:  Goal status: MET  Long term PT goals Target date: 02/13/2023 Pt will improve BERG by at least 3 points in order to demonstrate clinically significant improvement in balance.   Baseline:  Goal status: GOAL MET Pt will improve hip/knee strength to at least 5-/5 MMT to improve functional strength Baseline: Goal status: ONGOING Target dat:  03/28/23 Pt will report <1 fall during her duration in PT.         Baseline         Goal status: GOAL MET Pt will be able to ambulate community distances at least 1000 ft WNL gait pattern without complaints Baseline: Goal status: ONGOING Target dat:  03/28/23       5. Pt will be able to negotiate stairs with a reciprocal gait pattern       Baseline:        Goal status: PARTIALLY MET Target dat:  03/28/23       6. Pt will improve her STS by 2.3 seconds for Minimal clinically important difference.         Baseline:          Goal status: GOAL MET  7.  Pt will be independent with advanced HEP to maximize functional strength gains and balance for improved mobility and decreased fall risk.     Goal status:  INITIAL Target dat:  03/28/23   PLAN: PT  FREQUENCY: 1-2 times per week   PT DURATION: 4-6 weeks  PLANNED INTERVENTIONS (unless contraindicated): aquatic PT, Canalith repositioning, cryotherapy, Electrical stimulation, Iontophoresis with 4 mg/ml dexamethasome, Moist heat, traction, Ultrasound, gait training, Therapeutic exercise, balance training, neuromuscular re-education, patient/family education, prosthetic training, manual techniques, passive ROM, dry needling, taping, vasopnuematic device, vestibular, spinal manipulations, joint manipulations  PLAN FOR NEXT SESSION:  Assess strength next visit.  Possible discharge next visit.    Audie Clear III PT, DPT 03/16/23 5:37 PM

## 2023-03-27 ENCOUNTER — Ambulatory Visit (HOSPITAL_BASED_OUTPATIENT_CLINIC_OR_DEPARTMENT_OTHER): Payer: Medicare Other | Admitting: Physical Therapy

## 2023-03-27 DIAGNOSIS — M6281 Muscle weakness (generalized): Secondary | ICD-10-CM

## 2023-03-27 DIAGNOSIS — R2681 Unsteadiness on feet: Secondary | ICD-10-CM

## 2023-03-27 DIAGNOSIS — R2689 Other abnormalities of gait and mobility: Secondary | ICD-10-CM

## 2023-03-27 NOTE — Therapy (Addendum)
OUTPATIENT PHYSICAL THERAPY LOWER EXTREMITY TREATMENT / PROGRESS NOTE  Progress Note Reporting Period 02/16/23 to 03/27/23  See note below for Objective Data and Assessment of Progress/Goals.         Patient Name: Hailey Perkins MRN: [id number] DOB:1935/11/29, 88 y.o., female Today's Date: 03/28/2023  END OF SESSION:  PT End of Session - 04227/24      Visit Number 28   Number of Visits 33    Date for PT Re-Evaluation 05/08/23    Authorization Type [employer]    PT Start Time 1625   PT Stop Time 1708    PT Time Calculation (min) 43 min    Equipment Utilized During Treatment Gait belt    Activity Tolerance Patient tolerated treatment well    Behavior During Therapy WFL for tasks assessed/performed                    Past Medical History:  Diagnosis Date   Aortic atherosclerosis    Arthritis    Cataract 2010   bilateral eyes   Chicken pox    Cholelithiasis    Diverticulitis    Diverticulosis    GERD (gastroesophageal reflux disease)    Glaucoma    Hiatal hernia    History of frequent urinary tract infections    Hyperlipidemia    Osteopenia    Peripheral neuropathy    Sleep apnea    wears a CPAP   Urine incontinence    Past Surgical History:  Procedure Laterality Date   ABDOMINAL HYSTERECTOMY  1977   ANAL FISTULECTOMY  1985   ANKLE FRACTURE SURGERY Right 2014   APPENDECTOMY     BIOPSY  02/15/2021   Procedure: BIOPSY;  Surgeon: Lemar Lofty., MD;  Location: [facility];  Service: Gastroenterology;;   BREAST BIOPSY Bilateral 10+ yrs ago   BENIGN   BUNIONECTOMY Left 1975   Left Toe   CATARACT EXTRACTION, BILATERAL  2009   COLONOSCOPY WITH PROPOFOL N/A 02/15/2021   Procedure: COLONOSCOPY WITH PROPOFOL;  Surgeon: Lemar Lofty., MD;  Location: [facility];  Service: Gastroenterology;  Laterality: N/A;  ultraslimscope    DILATION AND CURETTAGE OF UTERUS  1972   eye PRK vision correction Left 10/09/2013   eye  socket re-formed Left 2014   FOOT NEUROMA SURGERY Right 2003   plus bunionectomy, right great toe   HAMMER TOE SURGERY Right 2005   Right foot correction hammertoe little toe   HEMORRHOID SURGERY  1967   HIP SURGERY Right 2014   right hip femur pin implanted   KNEE ARTHROSCOPY Left 2010   debridement left knee scar tissue   LAPAROSCOPIC OOPHERECTOMY     1986, 1990   LASIK  1998   NEUROMA SURGERY Right 2003   Right Foot Neuroma, plus Bunionectomy, Right Great Toe   POLYPECTOMY  02/15/2021   Procedure: POLYPECTOMY;  Surgeon: Mansouraty, Netty Starring., MD;  Location: [facility];  Service: Gastroenterology;;   RECTOCELE REPAIR  1968   REPLACEMENT TOTAL KNEE Left 01/2008   REPLACEMENT TOTAL KNEE Right 08/2008   retinal pucker correction Left 07/14/2012   schwannoma tumor (benign) removal  2011   right rib   SIGMOIDOSCOPY     TONSILLECTOMY AND ADENOIDECTOMY     VENTRAL HERNIA REPAIR  1993   Patient Active Problem List   Diagnosis Date Noted   Chronic pain of right knee 05/27/2021   OSA on CPAP 05/27/2021   LLQ pain 12/16/2020   History of diverticulitis 12/16/2020  Hematochezia 12/16/2020   Abnormal colonoscopy 12/16/2020   Diverticulitis of colon 08/15/2020   Sleep-related hypoxia 08/05/2019   OSA (obstructive sleep apnea) 08/05/2019   PLMD (periodic limb movement disorder) 08/05/2019   Cerebellar 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 05/06/2019   Excessive daytime sleepiness 05/06/2019   Loud snoring 05/06/2019   Ventral hernia without obstruction or gangrene 02/22/2019   Chronic throat clearing 01/18/2019   Nocturnal cough 01/18/2019   Family history of colon cancer in father 01/18/2019   Diverticulosis 01/18/2019   Gait abnormality 12/27/2018   Numbness 12/27/2018   Status post total bilateral knee replacement 11/30/2018   DDD (degenerative disc disease), cervical 11/30/2018   Status post carpal tunnel release 11/30/2018   DDD (degenerative disc disease), lumbar  10/26/2018   Primary osteoarthritis of both hands 10/26/2018   Primary osteoarthritis of both feet 10/26/2018   Gastroesophageal reflux disease 08/27/2018   Bilateral impacted cerumen 08/21/2018   Sensorineural hearing loss (SNHL) of both ears 08/21/2018   Tinnitus, bilateral 08/21/2018   Blood in urine 04/17/2018   Secondary open-angle glaucoma of left eye, moderate stage 03/10/2018   Early stage 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 10/05/2017   Epiretinal membrane (ERM) of left eye 10/05/2017   Pseudophakia of both eyes 10/05/2017   Osteopenia 03/19/2017   Mixed hyperlipidemia 08/18/2016   Generalized osteoarthritis of multiple sites 08/18/2016   Peripheral neuropathic pain 08/18/2016   Overactive bladder 08/18/2016   Glaucoma 08/18/2016   Cervical disc disorder with radiculopathy of cervical region 08/17/2016   Carpal tunnel syndrome, right upper limb 08/17/2016   Abnormal finding on mammography 06/14/2016   Breast lump 05/06/2015    PCP: Garlan Fillers, MD  REFERRING PROVIDER: Garlan Fillers, MD   REFERRING DIAG: Unsteadiness on feet [R26.81]   THERAPY DIAG:  Muscle weakness (generalized)  Other abnormalities of gait and mobility  Unsteadiness on feet  Rationale for Evaluation and Treatment: Rehabilitation  ONSET DATE: 2 years ago  SUBJECTIVE:   SUBJECTIVE STATEMENT: Pt's daughter present during treatment.  Pt denies any adverse effects after prior Rx.  Pt wonders why she still has the foot slap with gait.  Pt has been performing aquatic exercise class and performing some of her PT exercises in the pool.    FUNCTIONAL IMPROVEMENTS:  improved stability with turning kitchen, standing up from lower surfaces such as daughter's couch, stepping up/down the step in her daughter's home. Performing pool stairs FUNCTIONAL LIMITATIONS:  turning head while walking, weakness, no change with foot slap       PERTINENT HISTORY: Arthritis,  Osteopenia, Peripheral neuropathy, and bilat TKR. PAIN:  Are you having pain? No Pt denies any knee or hip pain.   PRECAUTIONS: None  WEIGHT BEARING RESTRICTIONS: No  FALLS:  Has patient fallen in last 6 months? Yes. Number of falls 2  LIVING ENVIRONMENT: Lives with: lives alone Lives in: House/apartment Stairs: No Has following equipment at home: Single point cane, shower chair, and Grab bars  OCCUPATION: Retired  PLOF: Independent  PATIENT GOALS: Pt would like to improve her strength in bilat LE's and improve her balance.   NEXT MD VISIT:   OBJECTIVE:   DIAGNOSTIC FINDINGS: None at this time.     TODAY'S TREATMENT:  PATIENT SURVEYS:  ABC scale:  Initial/Prior/Current:  50.6 % / 60% / 84%    Gait:  increased foot slap bilat with decreased toe off bilat.    LE STRENGTH: Hip flexion:  R: 4+/5, L:  4+/5 Knee flexion:  5/5 bilat Knee extension:  5/5 bilat Ankle DF:  R:  4-/5, L: 4/5 Ankle PF: WFL bilat  Pt ascended and descended stairs with a reciprocal gait with the rail SBA     Ther Ex: Pt performed:  DF with RTB 2x10 PT demonstrated DF with RTB to pt's daughter and instructed her to to perform at home.  Pt performed exercise with daughter holding band.pt  See below for pt education.    Neuromuscular Re-education: Tandem gait 2x30 sec each LE with UE support Tandem gait x 2 laps with UE support with SBA      PATIENT EDUCATION:  Education details:  POC, exercise form, objective findings, HEP, and exercise rationale. Person educated: Patient Education method: Medical illustrator, verbal cues Education comprehension: verbalized understanding and returned demonstration, verbal cues required  HOME EXERCISE PROGRAM: Access Code: [email or web address] URL: [email or web address] Date: 11/10/2022 Prepared by: Aaron Edelman   ASSESSMENT:  CLINICAL IMPRESSION:  Pt has made progress in balance, strength, and functional mobility.  Pt reports improved stability with turning in the kitchen and improved performance of steps in her daughter's home.  She demonstrates improved performance of stairs being able to perform reciprocal gait with rail.  Pt continues to have the same gait deficits including short choppy steps, decreased toe off, and foot slap though has overall improved stability with gait.  Pt does have balance deficits when turning head with gait.  She continues to have weakness in DF though has improved in LE strength.  Pt's daughter present.  PT instructed pt's daughter in how to assist pt with performing resisted DF with T band.  She performed in the clinic with her mother and demonstrates good understanding.  Pt is compliant with HEP.  Pt demonstrates improved self perceived disability/balance with ABC scale improving from prior 60% to currently 84%.  Pt met the only STG and met LTG's #1,3,5, and 6.  She is progressing toward other LTG's and should benefit from cont skilled PT services to address ongoing goals, improve balance/stability, and establish independence with final HEP.   OBJECTIVE IMPAIRMENTS: decreased activity tolerance, difficulty walking, decreased balance, decreased endurance, decreased mobility, decreased ROM, decreased strength, impaired flexibility, impaired UE/LE use, postural dysfunction, and pain.  ACTIVITY LIMITATIONS: bending, lifting, carry, locomotion, cleaning, community activity, driving, and or occupation  PERSONAL FACTORS: Arthritis, Osteopenia, Peripheral neuropathy.  are also affecting patient's functional outcome.  REHAB POTENTIAL: Good  CLINICAL DECISION MAKING: Stable/uncomplicated  EVALUATION COMPLEXITY: Low   GOALS: Short term PT Goals Target date:11/29/2022 Pt will be I and compliant with HEP. Baseline:  Goal status: MET  Long term PT goals Target date:  02/13/2023 Pt will improve BERG by at least 3 points in order to demonstrate clinically significant improvement in balance.   Baseline:  Goal status: GOAL MET Pt will improve hip/knee strength to at least 5-/5 MMT to improve functional strength Baseline: Goal status: PARTIALLY MET Target dat:  05/08/23 Pt will report <1 fall during her duration in PT.         Baseline         Goal status: GOAL MET Pt will be able to ambulate community distances at least 1000 ft WNL gait pattern without complaints Baseline: Goal status: PROGRESSING Target  dat:  05/08/23       5. Pt will be able to negotiate stairs with a reciprocal gait pattern       Baseline:        Goal status: GOAL MET WITH THE USAGE OF THE RAIL Target dat:  03/28/23       6. Pt will improve her STS by 2.3 seconds for Minimal clinically important difference.         Baseline:          Goal status: GOAL MET      7.  Pt will be independent with advanced HEP to maximize functional strength gains and balance for improved mobility and decreased fall risk.     Goal status:  PROGRESSING Target dat:  05/08/23   PLAN: PT FREQUENCY: 1 times per week   PT DURATION: 5-6 weeks  PLANNED INTERVENTIONS (unless contraindicated): aquatic PT, Canalith repositioning, cryotherapy, Electrical stimulation, Iontophoresis with 4 mg/ml dexamethasome, Moist heat, traction, Ultrasound, gait training, Therapeutic exercise, balance training, neuromuscular re-education, patient/family education, prosthetic training, manual techniques, passive ROM, dry needling, taping, vasopnuematic device, vestibular, spinal manipulations, joint manipulations  PLAN FOR NEXT SESSION:  Cont with skilled PT services 1x/wk.  Cont with LE strengthening and balance training.  Pt is agreeable with discharge.   Audie Clear III PT, DPT 03/29/23 12:02 AM

## 2023-03-28 ENCOUNTER — Encounter (HOSPITAL_BASED_OUTPATIENT_CLINIC_OR_DEPARTMENT_OTHER): Payer: Self-pay | Admitting: Physical Therapy

## 2023-04-07 ENCOUNTER — Ambulatory Visit (HOSPITAL_BASED_OUTPATIENT_CLINIC_OR_DEPARTMENT_OTHER): Payer: Medicare Other | Attending: Internal Medicine | Admitting: Physical Therapy

## 2023-04-07 ENCOUNTER — Encounter (HOSPITAL_BASED_OUTPATIENT_CLINIC_OR_DEPARTMENT_OTHER): Payer: Self-pay | Admitting: Physical Therapy

## 2023-04-07 DIAGNOSIS — R2689 Other abnormalities of gait and mobility: Secondary | ICD-10-CM | POA: Insufficient documentation

## 2023-04-07 DIAGNOSIS — M6281 Muscle weakness (generalized): Secondary | ICD-10-CM | POA: Diagnosis present

## 2023-04-07 DIAGNOSIS — R2681 Unsteadiness on feet: Secondary | ICD-10-CM | POA: Insufficient documentation

## 2023-04-07 NOTE — Therapy (Signed)
OUTPATIENT PHYSICAL THERAPY LOWER EXTREMITY TREATMENT .         Patient Name: Hailey Perkins MRN: [id number] DOB:1935/12/05, 88 y.o., female Today's Date: 04/07/2023  END OF SESSION:  PT End of Session - 04/07/19/24      Visit Number 29   Number of Visits 33    Date for PT Re-Evaluation 05/08/23    Authorization Type [employer]ICARE    PT Start Time 0803   PT Stop Time [phone]    PT Time Calculation (min) 41 min    Equipment Utilized During Treatment Gait belt    Activity Tolerance Patient tolerated treatment well    Behavior During Therapy WFL for tasks assessed/performed                    Past Medical History:  Diagnosis Date   Aortic atherosclerosis (HCC)    Arthritis    Cataract 2010   bilateral eyes   Chicken pox    Cholelithiasis    Diverticulitis    Diverticulosis    GERD (gastroesophageal reflux disease)    Glaucoma    Hiatal hernia    History of frequent urinary tract infections    Hyperlipidemia    Osteopenia    Peripheral neuropathy    Sleep apnea    wears a CPAP   Urine incontinence    Past Surgical History:  Procedure Laterality Date   ABDOMINAL HYSTERECTOMY  1977   ANAL FISTULECTOMY  1985   ANKLE FRACTURE SURGERY Right 2014   APPENDECTOMY     BIOPSY  02/15/2021   Procedure: BIOPSY;  Surgeon: Lemar Lofty., MD;  Location: [facility];  Service: Gastroenterology;;   BREAST BIOPSY Bilateral 10+ yrs ago   BENIGN   BUNIONECTOMY Left 1975   Left Toe   CATARACT EXTRACTION, BILATERAL  2009   COLONOSCOPY WITH PROPOFOL N/A 02/15/2021   Procedure: COLONOSCOPY WITH PROPOFOL;  Surgeon: Lemar Lofty., MD;  Location: [facility];  Service: Gastroenterology;  Laterality: N/A;  ultraslimscope    DILATION AND CURETTAGE OF UTERUS  1972   eye PRK vision correction Left 10/09/2013   eye socket re-formed Left 2014   FOOT NEUROMA SURGERY Right 2003   plus bunionectomy, right great toe   HAMMER TOE SURGERY Right 2005    Right foot correction hammertoe little toe   HEMORRHOID SURGERY  1967   HIP SURGERY Right 2014   right hip femur pin implanted   KNEE ARTHROSCOPY Left 2010   debridement left knee scar tissue   LAPAROSCOPIC OOPHERECTOMY     1986, 1990   LASIK  1998   NEUROMA SURGERY Right 2003   Right Foot Neuroma, plus Bunionectomy, Right Great Toe   POLYPECTOMY  02/15/2021   Procedure: POLYPECTOMY;  Surgeon: Lemar Lofty., MD;  Location: [facility];  Service: Gastroenterology;;   RECTOCELE REPAIR  1968   REPLACEMENT TOTAL KNEE Left 01/2008   REPLACEMENT TOTAL KNEE Right 08/2008   retinal pucker correction Left 07/14/2012   schwannoma tumor (benign) removal  2011   right rib   SIGMOIDOSCOPY     TONSILLECTOMY AND ADENOIDECTOMY     VENTRAL HERNIA REPAIR  1993   Patient Active Problem List   Diagnosis Date Noted   Chronic pain of right knee 05/27/2021   OSA on CPAP 05/27/2021   LLQ pain 12/16/2020   History of diverticulitis 12/16/2020   Hematochezia 12/16/2020   Abnormal colonoscopy 12/16/2020   Diverticulitis of colon 08/15/2020   Sleep-related hypoxia 08/05/2019  OSA (obstructive sleep apnea) 08/05/2019   PLMD (periodic limb movement disorder) 08/05/2019   Cerebellar 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) 05/06/2019   Excessive daytime sleepiness 05/06/2019   Loud snoring 05/06/2019   Ventral hernia without obstruction or gangrene 02/22/2019   Chronic throat clearing 01/18/2019   Nocturnal cough 01/18/2019   Family history of colon cancer in father 01/18/2019   Diverticulosis 01/18/2019   Gait abnormality 12/27/2018   Numbness 12/27/2018   Status post total bilateral knee replacement 11/30/2018   DDD (degenerative disc disease), cervical 11/30/2018   Status post carpal tunnel release 11/30/2018   DDD (degenerative disc disease), lumbar 10/26/2018   Primary osteoarthritis of both hands 10/26/2018   Primary osteoarthritis of both feet 10/26/2018    Gastroesophageal reflux disease 08/27/2018   Bilateral impacted cerumen 08/21/2018   Sensorineural hearing loss (SNHL) of both ears 08/21/2018   Tinnitus, bilateral 08/21/2018   Blood in urine 04/17/2018   Secondary open-angle glaucoma of left eye, moderate stage 03/10/2018   Early stage 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 10/05/2017   Epiretinal membrane (ERM) of left eye 10/05/2017   Pseudophakia of both eyes 10/05/2017   Osteopenia 03/19/2017   Mixed hyperlipidemia 08/18/2016   Generalized osteoarthritis of multiple sites 08/18/2016   Peripheral neuropathic pain 08/18/2016   Overactive bladder 08/18/2016   Glaucoma 08/18/2016   Cervical disc disorder with radiculopathy of cervical region 08/17/2016   Carpal tunnel syndrome, right upper limb 08/17/2016   Abnormal finding on mammography 06/14/2016   Breast lump 05/06/2015    PCP: Garlan Fillers, MD  REFERRING PROVIDER: Garlan Fillers, MD   REFERRING DIAG: Unsteadiness on feet [R26.81]   THERAPY DIAG:  Muscle weakness (generalized)  Other abnormalities of gait and mobility  Unsteadiness on feet  Rationale for Evaluation and Treatment: Rehabilitation  ONSET DATE: 2 years ago  SUBJECTIVE:   SUBJECTIVE STATEMENT: Pt denies any adverse effects after prior Rx.  Pt reports no change with foot slap/clearance.  Pt hasn't been able to perform resisted DF with t band with daugther yet.  She has been performing her HEP.  Pt has been performing aquatic exercise class and performing some of her PT exercises in the pool.    FUNCTIONAL IMPROVEMENTS:  improved stability with turning kitchen, standing up from lower surfaces such as daughter's couch, stepping up/down the step in her daughter's home. Performing pool stairs FUNCTIONAL LIMITATIONS:  turning head while walking, weakness, no change with foot slap       PERTINENT HISTORY: Arthritis, Osteopenia, Peripheral neuropathy, and bilat TKR. PAIN:  Are  you having pain? No Pt denies any knee or hip pain.   PRECAUTIONS: None  WEIGHT BEARING RESTRICTIONS: No  FALLS:  Has patient fallen in last 6 months? Yes. Number of falls 2  LIVING ENVIRONMENT: Lives with: lives alone Lives in: House/apartment Stairs: No Has following equipment at home: Single point cane, shower chair, and Grab bars  OCCUPATION: Retired  PLOF: Independent  PATIENT GOALS: Pt would like to improve her strength in bilat LE's and improve her balance.   NEXT MD VISIT:   OBJECTIVE:   DIAGNOSTIC FINDINGS: None at this time.     TODAY'S TREATMENT:  Ther Ex: Pt performed: Nustep L5 x 5 mins Ues/Les  DF with RTB 3x10 Lateral band walks with GTB around thighs x 3 sets at [place] with occasional UE support with SBA/CGA      Neuromuscular Re-education: Walking with head turns without AD with CGA/SBA down hallway  Pt ambulated with changing speeds with verbal command of "fast" and "slow" with SBA/CGA Pt ambulating over 4 hurdles with airex pad at the end with CGA with a reciprocal gait with UE support Pt ambulated around 4 hurdles with SBA/CGA Tandem stance 2x30 sec each LE with UE support Tandem gait x 3-4 laps with UE support with SBA      PATIENT EDUCATION:  Education details:  POC, exercise form, objective findings, HEP, and exercise rationale. Person educated: Patient Education method: Medical illustrator, verbal cues Education comprehension: verbalized understanding and returned demonstration, verbal cues required  HOME EXERCISE PROGRAM: Access Code: [email or web address] URL: [email or web address] Date: 11/10/2022 Prepared by: Aaron Edelman   ASSESSMENT:  CLINICAL IMPRESSION:  Pt continues to have decreased foot clearance bilat with increased foot slap with gait.  Pt is able to increase speed with gait with  verbal commands though is still slow with gait.  Pt had no significant LOB with ambulating with head turns and ambulating with varying speeds.  Occasionally she was unsteady though she correct that on her own.  Pt required UE support with ambulating over hurdles though did well with foot clearance and also stepping over hurdles.  Pt performed the majority of the lateral band walks without UE support and used the Basilio occasionally for support.  She has improved with balance overall.  Pt gives great effort with exercise.  She responded well to Rx having no c/o's after Rx though was fatigued.  She should benefit from cont skilled PT services to address ongoing goals, improve balance/stability, and establish independence with final HEP.   OBJECTIVE IMPAIRMENTS: decreased activity tolerance, difficulty walking, decreased balance, decreased endurance, decreased mobility, decreased ROM, decreased strength, impaired flexibility, impaired UE/LE use, postural dysfunction, and pain.  ACTIVITY LIMITATIONS: bending, lifting, carry, locomotion, cleaning, community activity, driving, and or occupation  PERSONAL FACTORS: Arthritis, Osteopenia, Peripheral neuropathy.  are also affecting patient's functional outcome.  REHAB POTENTIAL: Good  CLINICAL DECISION MAKING: Stable/uncomplicated  EVALUATION COMPLEXITY: Low   GOALS: Short term PT Goals Target date:11/29/2022 Pt will be I and compliant with HEP. Baseline:  Goal status: MET  Long term PT goals Target date: 02/13/2023 Pt will improve BERG by at least 3 points in order to demonstrate clinically significant improvement in balance.   Baseline:  Goal status: GOAL MET Pt will improve hip/knee strength to at least 5-/5 MMT to improve functional strength Baseline: Goal status: PARTIALLY MET Target dat:  05/08/23 Pt will report <1 fall during her duration in PT.         Baseline         Goal status: GOAL MET Pt will be able to ambulate community distances at  least 1000 ft WNL gait pattern without complaints Baseline: Goal status: PROGRESSING Target dat:  05/08/23       5. Pt will be able to negotiate stairs with a reciprocal gait pattern       Baseline:        Goal status: GOAL MET WITH THE USAGE OF THE RAIL Target dat:  03/28/23       6. Pt will improve her STS by 2.3 seconds for Minimal clinically important difference.  Baseline:          Goal status: GOAL MET      7.  Pt will be independent with advanced HEP to maximize functional strength gains and balance for improved mobility and decreased fall risk.     Goal status:  PROGRESSING Target dat:  05/08/23   PLAN: PT FREQUENCY: 1 times per week   PT DURATION: 5-6 weeks  PLANNED INTERVENTIONS (unless contraindicated): aquatic PT, Canalith repositioning, cryotherapy, Electrical stimulation, Iontophoresis with 4 mg/ml dexamethasome, Moist heat, traction, Ultrasound, gait training, Therapeutic exercise, balance training, neuromuscular re-education, patient/family education, prosthetic training, manual techniques, passive ROM, dry needling, taping, vasopnuematic device, vestibular, spinal manipulations, joint manipulations  PLAN FOR NEXT SESSION:  Cont 1x/wk.  Cont with LE strengthening and balance training.    Audie Clear III PT, DPT 04/07/23 10:51 PM

## 2023-04-11 ENCOUNTER — Encounter (HOSPITAL_BASED_OUTPATIENT_CLINIC_OR_DEPARTMENT_OTHER): Payer: Self-pay | Admitting: Physical Therapy

## 2023-04-11 ENCOUNTER — Ambulatory Visit (HOSPITAL_BASED_OUTPATIENT_CLINIC_OR_DEPARTMENT_OTHER): Payer: Medicare Other | Admitting: Physical Therapy

## 2023-04-11 DIAGNOSIS — M6281 Muscle weakness (generalized): Secondary | ICD-10-CM

## 2023-04-11 DIAGNOSIS — R2681 Unsteadiness on feet: Secondary | ICD-10-CM

## 2023-04-11 DIAGNOSIS — R2689 Other abnormalities of gait and mobility: Secondary | ICD-10-CM

## 2023-04-11 NOTE — Therapy (Signed)
OUTPATIENT PHYSICAL THERAPY LOWER EXTREMITY TREATMENT .         Patient Name: Hailey Perkins MRN: [id number] DOB:Nov 29, 1935, 88 y.o., female Today's Date: 04/12/2023  END OF SESSION:  PT End of Session - 04/11/19/24      Visit Number 30   Number of Visits 33    Date for PT Re-Evaluation 05/08/23    Authorization Type [employer]    PT Start Time 0803   PT Stop Time [phone]    PT Time Calculation (min) 39 min    Equipment Utilized During Treatment Gait belt    Activity Tolerance Patient tolerated treatment well    Behavior During Therapy WFL for tasks assessed/performed                    Past Medical History:  Diagnosis Date   Aortic atherosclerosis (HCC)    Arthritis    Cataract 2010   bilateral eyes   Chicken pox    Cholelithiasis    Diverticulitis    Diverticulosis    GERD (gastroesophageal reflux disease)    Glaucoma    Hiatal hernia    History of frequent urinary tract infections    Hyperlipidemia    Osteopenia    Peripheral neuropathy    Sleep apnea    wears a CPAP   Urine incontinence    Past Surgical History:  Procedure Laterality Date   ABDOMINAL HYSTERECTOMY  1977   ANAL FISTULECTOMY  1985   ANKLE FRACTURE SURGERY Right 2014   APPENDECTOMY     BIOPSY  02/15/2021   Procedure: BIOPSY;  Surgeon: Lemar Lofty., MD;  Location: [facility];  Service: Gastroenterology;;   BREAST BIOPSY Bilateral 10+ yrs ago   BENIGN   BUNIONECTOMY Left 1975   Left Toe   CATARACT EXTRACTION, BILATERAL  2009   COLONOSCOPY WITH PROPOFOL N/A 02/15/2021   Procedure: COLONOSCOPY WITH PROPOFOL;  Surgeon: Lemar Lofty., MD;  Location: [facility];  Service: Gastroenterology;  Laterality: N/A;  ultraslimscope    DILATION AND CURETTAGE OF UTERUS  1972   eye PRK vision correction Left 10/09/2013   eye socket re-formed Left 2014   FOOT NEUROMA SURGERY Right 2003   plus bunionectomy, right great toe   HAMMER TOE SURGERY Right 2005    Right foot correction hammertoe little toe   HEMORRHOID SURGERY  1967   HIP SURGERY Right 2014   right hip femur pin implanted   KNEE ARTHROSCOPY Left 2010   debridement left knee scar tissue   LAPAROSCOPIC OOPHERECTOMY     1986, 1990   LASIK  1998   NEUROMA SURGERY Right 2003   Right Foot Neuroma, plus Bunionectomy, Right Great Toe   POLYPECTOMY  02/15/2021   Procedure: POLYPECTOMY;  Surgeon: Lemar Lofty., MD;  Location: [facility];  Service: Gastroenterology;;   RECTOCELE REPAIR  1968   REPLACEMENT TOTAL KNEE Left 01/2008   REPLACEMENT TOTAL KNEE Right 08/2008   retinal pucker correction Left 07/14/2012   schwannoma tumor (benign) removal  2011   right rib   SIGMOIDOSCOPY     TONSILLECTOMY AND ADENOIDECTOMY     VENTRAL HERNIA REPAIR  1993   Patient Active Problem List   Diagnosis Date Noted   Chronic pain of right knee 05/27/2021   OSA on CPAP 05/27/2021   LLQ pain 12/16/2020   History of diverticulitis 12/16/2020   Hematochezia 12/16/2020   Abnormal colonoscopy 12/16/2020   Diverticulitis of colon 08/15/2020   Sleep-related hypoxia 08/05/2019  OSA (obstructive sleep apnea) 08/05/2019   PLMD (periodic limb movement disorder) 08/05/2019   Cerebellar 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) 05/06/2019   Excessive daytime sleepiness 05/06/2019   Loud snoring 05/06/2019   Ventral hernia without obstruction or gangrene 02/22/2019   Chronic throat clearing 01/18/2019   Nocturnal cough 01/18/2019   Family history of colon cancer in father 01/18/2019   Diverticulosis 01/18/2019   Gait abnormality 12/27/2018   Numbness 12/27/2018   Status post total bilateral knee replacement 11/30/2018   DDD (degenerative disc disease), cervical 11/30/2018   Status post carpal tunnel release 11/30/2018   DDD (degenerative disc disease), lumbar 10/26/2018   Primary osteoarthritis of both hands 10/26/2018   Primary osteoarthritis of both feet 10/26/2018    Gastroesophageal reflux disease 08/27/2018   Bilateral impacted cerumen 08/21/2018   Sensorineural hearing loss (SNHL) of both ears 08/21/2018   Tinnitus, bilateral 08/21/2018   Blood in urine 04/17/2018   Secondary open-angle glaucoma of left eye, moderate stage 03/10/2018   Early stage 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 10/05/2017   Epiretinal membrane (ERM) of left eye 10/05/2017   Pseudophakia of both eyes 10/05/2017   Osteopenia 03/19/2017   Mixed hyperlipidemia 08/18/2016   Generalized osteoarthritis of multiple sites 08/18/2016   Peripheral neuropathic pain 08/18/2016   Overactive bladder 08/18/2016   Glaucoma 08/18/2016   Cervical disc disorder with radiculopathy of cervical region 08/17/2016   Carpal tunnel syndrome, right upper limb 08/17/2016   Abnormal finding on mammography 06/14/2016   Breast lump 05/06/2015    PCP: Garlan Fillers, MD  REFERRING PROVIDER: Garlan Fillers, MD   REFERRING DIAG: Unsteadiness on feet [R26.81]   THERAPY DIAG:  Muscle weakness (generalized)  Other abnormalities of gait and mobility  Unsteadiness on feet  Rationale for Evaluation and Treatment: Rehabilitation  ONSET DATE: 2 years ago  SUBJECTIVE:   SUBJECTIVE STATEMENT: Pt denies any adverse effects after prior Rx.  Pt reports improved balance with ambulation and stability.  Pt states she doesn't lean as much on her cane anymore.  Pt is able to walk faster.  Pt has been performing aquatic exercise class and performing some of her PT exercises in the pool.         PERTINENT HISTORY: Arthritis, Osteopenia, Peripheral neuropathy, and bilat TKR. PAIN:  Are you having pain? No Pt denies any knee or hip pain.   PRECAUTIONS: None  WEIGHT BEARING RESTRICTIONS: No  FALLS:  Has patient fallen in last 6 months? Yes. Number of falls 2  LIVING ENVIRONMENT: Lives with: lives alone Lives in: House/apartment Stairs: No Has following equipment at  home: Single point cane, shower chair, and Grab bars  OCCUPATION: Retired  PLOF: Independent  PATIENT GOALS: Pt would like to improve her strength in bilat LE's and improve her balance.   NEXT MD VISIT:   OBJECTIVE:   DIAGNOSTIC FINDINGS: None at this time.     TODAY'S TREATMENT:  Ther Ex: Pt performed:  DF with RTB 3x10 Lateral band walks with GTB around thighs x 3 sets at [place] with occasional UE support with SBA/CGA      Neuromuscular Re-education: Walking with head turns without AD with CGA/SBA down hallway  Pt ambulated with changing speeds with verbal command of "fast" and "slow" with SBA/CGA Pt ambulating over 4 hurdles with airex pad at the end with CGA with a reciprocal gait with UE support Pt ambulated around 4 hurdles with SBA/CGA Tandem stance 2x30 sec each LE with UE support Tandem gait x 3-4 laps with UE support with SBA Braiding with SBA/CGA with UE support x 3 laps      PATIENT EDUCATION:  Education details:  POC, exercise form, objective findings, HEP, and exercise rationale. Person educated: Patient Education method: Medical illustrator, verbal cues Education comprehension: verbalized understanding and returned demonstration, verbal cues required  HOME EXERCISE PROGRAM: Access Code: [email or web address] URL: [email or web address] Date: 11/10/2022 Prepared by: Aaron Edelman   ASSESSMENT:  CLINICAL IMPRESSION:  Pt continues to have decreased foot clearance bilat with increased foot slap with gait.  Pt demonstrates increased gait speed.  Pt is improving with balance and ambulation as evidenced by subjective reports of not leaning as much on her cane and also walking faster.  Pt had 1 LOB requiring max assist from PT to correct LOB with ambulating with fast and slow commands when she was increasing her speed.  Pt  required UE support with ambulating over hurdles though did well with foot clearance and also stepping over hurdles.  Pt performed the majority of the lateral band walks without UE support and used the [place] occasionally for support.  Pt gives great effort with exercise.  She responded well to Rx having no c/o's after Rx.   OBJECTIVE IMPAIRMENTS: decreased activity tolerance, difficulty walking, decreased balance, decreased endurance, decreased mobility, decreased ROM, decreased strength, impaired flexibility, impaired UE/LE use, postural dysfunction, and pain.  ACTIVITY LIMITATIONS: bending, lifting, carry, locomotion, cleaning, community activity, driving, and or occupation  PERSONAL FACTORS: Arthritis, Osteopenia, Peripheral neuropathy.  are also affecting patient's functional outcome.  REHAB POTENTIAL: Good  CLINICAL DECISION MAKING: Stable/uncomplicated  EVALUATION COMPLEXITY: Low   GOALS: Short term PT Goals Target date:11/29/2022 Pt will be I and compliant with HEP. Baseline:  Goal status: MET  Long term PT goals Target date: 02/13/2023 Pt will improve BERG by at least 3 points in order to demonstrate clinically significant improvement in balance.   Baseline:  Goal status: GOAL MET Pt will improve hip/knee strength to at least 5-/5 MMT to improve functional strength Baseline: Goal status: PARTIALLY MET Target dat:  05/08/23 Pt will report <1 fall during her duration in PT.         Baseline         Goal status: GOAL MET Pt will be able to ambulate community distances at least 1000 ft WNL gait pattern without complaints Baseline: Goal status: PROGRESSING Target dat:  05/08/23       5. Pt will be able to negotiate stairs with a reciprocal gait pattern       Baseline:        Goal status: GOAL MET WITH THE USAGE OF THE RAIL Target dat:  03/28/23       6. Pt will improve her STS by 2.3 seconds for Minimal clinically important difference.         Baseline:          Goal status:  GOAL MET  7.  Pt will be independent with advanced HEP to maximize functional strength gains and balance for improved mobility and decreased fall risk.     Goal status:  PROGRESSING Target dat:  05/08/23   PLAN: PT FREQUENCY: 1 times per week   PT DURATION: 5-6 weeks  PLANNED INTERVENTIONS (unless contraindicated): aquatic PT, Canalith repositioning, cryotherapy, Electrical stimulation, Iontophoresis with 4 mg/ml dexamethasome, Moist heat, traction, Ultrasound, gait training, Therapeutic exercise, balance training, neuromuscular re-education, patient/family education, prosthetic training, manual techniques, passive ROM, dry needling, taping, vasopnuematic device, vestibular, spinal manipulations, joint manipulations  PLAN FOR NEXT SESSION:  Cont 1x/wk.  Cont with LE strengthening and balance training.  Work on [employer].  Audie Clear III PT, DPT 04/12/23 8:55 PM

## 2023-04-18 ENCOUNTER — Ambulatory Visit (HOSPITAL_BASED_OUTPATIENT_CLINIC_OR_DEPARTMENT_OTHER): Payer: Medicare Other | Admitting: Physical Therapy

## 2023-04-18 DIAGNOSIS — M6281 Muscle weakness (generalized): Secondary | ICD-10-CM

## 2023-04-18 DIAGNOSIS — R2681 Unsteadiness on feet: Secondary | ICD-10-CM

## 2023-04-18 DIAGNOSIS — R2689 Other abnormalities of gait and mobility: Secondary | ICD-10-CM

## 2023-04-18 NOTE — Therapy (Signed)
OUTPATIENT PHYSICAL THERAPY LOWER EXTREMITY TREATMENT .         Patient Name: Hailey Perkins MRN: [id number] DOB:January 01, 1935, 88 y.o., female Today's Date: 04/19/2023  END OF SESSION:  PT End of Session - 04/18/2023      Visit Number 31   Number of Visits 33    Date for PT Re-Evaluation 05/08/23    Authorization Type [employer]    PT Start Time 1023   PT Stop Time 1101    PT Time Calculation (min) 38 min    Equipment Utilized During Treatment Gait belt    Activity Tolerance Patient tolerated treatment well    Behavior During Therapy WFL for tasks assessed/performed                    Past Medical History:  Diagnosis Date   Aortic atherosclerosis (HCC)    Arthritis    Cataract 2010   bilateral eyes   Chicken pox    Cholelithiasis    Diverticulitis    Diverticulosis    GERD (gastroesophageal reflux disease)    Glaucoma    Hiatal hernia    History of frequent urinary tract infections    Hyperlipidemia    Osteopenia    Peripheral neuropathy    Sleep apnea    wears a CPAP   Urine incontinence    Past Surgical History:  Procedure Laterality Date   ABDOMINAL HYSTERECTOMY  1977   ANAL FISTULECTOMY  1985   ANKLE FRACTURE SURGERY Right 2014   APPENDECTOMY     BIOPSY  02/15/2021   Procedure: BIOPSY;  Surgeon: Lemar Lofty., MD;  Location: [facility];  Service: Gastroenterology;;   BREAST BIOPSY Bilateral 10+ yrs ago   BENIGN   BUNIONECTOMY Left 1975   Left Toe   CATARACT EXTRACTION, BILATERAL  2009   COLONOSCOPY WITH PROPOFOL N/A 02/15/2021   Procedure: COLONOSCOPY WITH PROPOFOL;  Surgeon: Lemar Lofty., MD;  Location: [facility];  Service: Gastroenterology;  Laterality: N/A;  ultraslimscope    DILATION AND CURETTAGE OF UTERUS  1972   eye PRK vision correction Left 10/09/2013   eye socket re-formed Left 2014   FOOT NEUROMA SURGERY Right 2003   plus bunionectomy, right great toe   HAMMER TOE SURGERY Right 2005    Right foot correction hammertoe little toe   HEMORRHOID SURGERY  1967   HIP SURGERY Right 2014   right hip femur pin implanted   KNEE ARTHROSCOPY Left 2010   debridement left knee scar tissue   LAPAROSCOPIC OOPHERECTOMY     1986, 1990   LASIK  1998   NEUROMA SURGERY Right 2003   Right Foot Neuroma, plus Bunionectomy, Right Great Toe   POLYPECTOMY  02/15/2021   Procedure: POLYPECTOMY;  Surgeon: Lemar Lofty., MD;  Location: [facility];  Service: Gastroenterology;;   RECTOCELE REPAIR  1968   REPLACEMENT TOTAL KNEE Left 01/2008   REPLACEMENT TOTAL KNEE Right 08/2008   retinal pucker correction Left 07/14/2012   schwannoma tumor (benign) removal  2011   right rib   SIGMOIDOSCOPY     TONSILLECTOMY AND ADENOIDECTOMY     VENTRAL HERNIA REPAIR  1993   Patient Active Problem List   Diagnosis Date Noted   Chronic pain of right knee 05/27/2021   OSA on CPAP 05/27/2021   LLQ pain 12/16/2020   History of diverticulitis 12/16/2020   Hematochezia 12/16/2020   Abnormal colonoscopy 12/16/2020   Diverticulitis of colon 08/15/2020   Sleep-related hypoxia 08/05/2019  OSA (obstructive sleep apnea) 08/05/2019   PLMD (periodic limb movement disorder) 08/05/2019   Cerebellar 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) 05/06/2019   Excessive daytime sleepiness 05/06/2019   Loud snoring 05/06/2019   Ventral hernia without obstruction or gangrene 02/22/2019   Chronic throat clearing 01/18/2019   Nocturnal cough 01/18/2019   Family history of colon cancer in father 01/18/2019   Diverticulosis 01/18/2019   Gait abnormality 12/27/2018   Numbness 12/27/2018   Status post total bilateral knee replacement 11/30/2018   DDD (degenerative disc disease), cervical 11/30/2018   Status post carpal tunnel release 11/30/2018   DDD (degenerative disc disease), lumbar 10/26/2018   Primary osteoarthritis of both hands 10/26/2018   Primary osteoarthritis of both feet 10/26/2018    Gastroesophageal reflux disease 08/27/2018   Bilateral impacted cerumen 08/21/2018   Sensorineural hearing loss (SNHL) of both ears 08/21/2018   Tinnitus, bilateral 08/21/2018   Blood in urine 04/17/2018   Secondary open-angle glaucoma of left eye, moderate stage 03/10/2018   Early stage 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 10/05/2017   Epiretinal membrane (ERM) of left eye 10/05/2017   Pseudophakia of both eyes 10/05/2017   Osteopenia 03/19/2017   Mixed hyperlipidemia 08/18/2016   Generalized osteoarthritis of multiple sites 08/18/2016   Peripheral neuropathic pain 08/18/2016   Overactive bladder 08/18/2016   Glaucoma 08/18/2016   Cervical disc disorder with radiculopathy of cervical region 08/17/2016   Carpal tunnel syndrome, right upper limb 08/17/2016   Abnormal finding on mammography 06/14/2016   Breast lump 05/06/2015    PCP: Garlan Fillers, MD  REFERRING PROVIDER: Garlan Fillers, MD   REFERRING DIAG: Unsteadiness on feet [R26.81]   THERAPY DIAG:  Muscle weakness (generalized)  Other abnormalities of gait and mobility  Unsteadiness on feet  Rationale for Evaluation and Treatment: Rehabilitation  ONSET DATE: 2 years ago  SUBJECTIVE:   SUBJECTIVE STATEMENT: Pt denies any adverse effects after prior Rx.  Pt states it's easier getting dressed including donning pants.  Pt states dressing it's easier due to her increased quad strength.  Pt wore sandals at church and felt more unsteady requiring increased usage of cane at church.  Pt does use a lift in the shoes she normally wears.  Pt states she doesn't lean as much on her cane anymore.  Pt has been ambulating on TM x 30 mins 2-3x/wk and uses the rails.  Pt has been a performing aquatic exercise class 3x/wk and performing some of her PT exercises in the pool.  Pt has not performed resisted DF with T band with daughter yet.        PERTINENT HISTORY: Arthritis, Osteopenia, Peripheral  neuropathy, and bilat TKR. PAIN:  Are you having pain? No Pt denies any knee or hip pain.   PRECAUTIONS: None  WEIGHT BEARING RESTRICTIONS: No  FALLS:  Has patient fallen in last 6 months? Yes. Number of falls 2  LIVING ENVIRONMENT: Lives with: lives alone Lives in: House/apartment Stairs: No Has following equipment at home: Single point cane, shower chair, and Grab bars  OCCUPATION: Retired  PLOF: Independent  PATIENT GOALS: Pt would like to improve her strength in bilat LE's and improve her balance.   NEXT MD VISIT:   OBJECTIVE:   DIAGNOSTIC FINDINGS: None at this time.     TODAY'S TREATMENT:  Ther Ex: Pt performed:  Nustep x 5 mins at L5  Therapeutic Activities: Pt ascended and descended 1 flight of stairs with a rail with a reciprocal gait with SBA/CGA x 2 reps.      Neuromuscular Re-education: Walking with head turns without AD with CGA/SBA down hallway  Pt ambulated with changing speeds with verbal command of "fast" and "slow" with CGA Pt ambulating over 4 hurdles with airex pad at the end with CGA with a reciprocal gait with UE support Lateral stepping over 4 hurdles with CGA with UE support Tandem gait x 3-4 laps with UE support with SBA Braiding with SBA/CGA with UE support x 3 laps      PATIENT EDUCATION:  Education details:  POC, exercise form, HEP, and exercise rationale. Person educated: Patient Education method: Medical illustrator, verbal cues Education comprehension: verbalized understanding and returned demonstration, verbal cues required  HOME EXERCISE PROGRAM: Access Code: [email or web address] URL: [email or web address] Date: 11/10/2022 Prepared by: Aaron Edelman   ASSESSMENT:  CLINICAL IMPRESSION:  Pt reports improved dressing and she also does not have to lean on cane as much with ambulation.   Pt continues to have decreased foot clearance and increased foot slap.  Pt is able to increase gait speed though was fatigued with changing speed and was unable to walk as fast toward the end of the laps.  Pt had no LOB with ambulating with changing speeds and ambulating with head turns.  Pt requires UE support with tandem gait and braiding.  Pt has made good progress overall with stairs and is able to perform stairs with a reciprocal gait with rail.  She gives great effort with exercises/activities and responded well to Rx having no c/o's after Rx.     OBJECTIVE IMPAIRMENTS: decreased activity tolerance, difficulty walking, decreased balance, decreased endurance, decreased mobility, decreased ROM, decreased strength, impaired flexibility, impaired UE/LE use, postural dysfunction, and pain.  ACTIVITY LIMITATIONS: bending, lifting, carry, locomotion, cleaning, community activity, driving, and or occupation  PERSONAL FACTORS: Arthritis, Osteopenia, Peripheral neuropathy.  are also affecting patient's functional outcome.  REHAB POTENTIAL: Good  CLINICAL DECISION MAKING: Stable/uncomplicated  EVALUATION COMPLEXITY: Low   GOALS: Short term PT Goals Target date:11/29/2022 Pt will be I and compliant with HEP. Baseline:  Goal status: MET  Long term PT goals Target date: 02/13/2023 Pt will improve BERG by at least 3 points in order to demonstrate clinically significant improvement in balance.   Baseline:  Goal status: GOAL MET Pt will improve hip/knee strength to at least 5-/5 MMT to improve functional strength Baseline: Goal status: PARTIALLY MET Target dat:  05/08/23 Pt will report <1 fall during her duration in PT.         Baseline         Goal status: GOAL MET Pt will be able to ambulate community distances at least 1000 ft WNL gait pattern without complaints Baseline: Goal status: PROGRESSING Target dat:  05/08/23       5. Pt will be able to negotiate stairs with a reciprocal gait  pattern       Baseline:        Goal status: GOAL MET WITH THE USAGE OF THE RAIL Target dat:  03/28/23       6. Pt will improve her STS by 2.3 seconds for Minimal clinically important difference.         Baseline:          Goal status: GOAL MET      7.  Pt  will be independent with advanced HEP to maximize functional strength gains and balance for improved mobility and decreased fall risk.     Goal status:  PROGRESSING Target dat:  05/08/23   PLAN: PT FREQUENCY: 1 times per week   PT DURATION: 5-6 weeks  PLANNED INTERVENTIONS (unless contraindicated): aquatic PT, Canalith repositioning, cryotherapy, Electrical stimulation, Iontophoresis with 4 mg/ml dexamethasome, Moist heat, traction, Ultrasound, gait training, Therapeutic exercise, balance training, neuromuscular re-education, patient/family education, prosthetic training, manual techniques, passive ROM, dry needling, taping, vasopnuematic device, vestibular, spinal manipulations, joint manipulations  PLAN FOR NEXT SESSION:  Cont 1x/wk.  Cont with LE strengthening and balance training.  Work on [employer].  Audie Clear III PT, DPT 04/19/23 4:22 PM

## 2023-04-19 ENCOUNTER — Encounter (HOSPITAL_BASED_OUTPATIENT_CLINIC_OR_DEPARTMENT_OTHER): Payer: Self-pay | Admitting: Physical Therapy

## 2023-04-24 ENCOUNTER — Encounter (HOSPITAL_BASED_OUTPATIENT_CLINIC_OR_DEPARTMENT_OTHER): Payer: Self-pay | Admitting: Physical Therapy

## 2023-04-24 ENCOUNTER — Ambulatory Visit (HOSPITAL_BASED_OUTPATIENT_CLINIC_OR_DEPARTMENT_OTHER): Payer: Medicare Other | Admitting: Physical Therapy

## 2023-04-24 DIAGNOSIS — M6281 Muscle weakness (generalized): Secondary | ICD-10-CM

## 2023-04-24 DIAGNOSIS — R2689 Other abnormalities of gait and mobility: Secondary | ICD-10-CM

## 2023-04-24 DIAGNOSIS — R2681 Unsteadiness on feet: Secondary | ICD-10-CM

## 2023-04-24 NOTE — Therapy (Signed)
OUTPATIENT PHYSICAL THERAPY LOWER EXTREMITY TREATMENT .         Patient Name: Hailey Perkins MRN: [id number] DOB:05-Sep-1935, 88 y.o., female Today's Date: 04/24/2023  END OF SESSION:  PT End of Session - 04/24/2023      Visit Number 32   Number of Visits 33    Date for PT Re-Evaluation 05/08/23    Authorization Type [employer]    PT Start Time 1028   PT Stop Time 1110    PT Time Calculation (min) 42 min    Equipment Utilized During Treatment Gait belt    Activity Tolerance Patient tolerated treatment well    Behavior During Therapy WFL for tasks assessed/performed                      Past Medical History:  Diagnosis Date   Aortic atherosclerosis (HCC)    Arthritis    Cataract 2010   bilateral eyes   Chicken pox    Cholelithiasis    Diverticulitis    Diverticulosis    GERD (gastroesophageal reflux disease)    Glaucoma    Hiatal hernia    History of frequent urinary tract infections    Hyperlipidemia    Osteopenia    Peripheral neuropathy    Sleep apnea    wears a CPAP   Urine incontinence    Past Surgical History:  Procedure Laterality Date   ABDOMINAL HYSTERECTOMY  1977   ANAL FISTULECTOMY  1985   ANKLE FRACTURE SURGERY Right 2014   APPENDECTOMY     BIOPSY  02/15/2021   Procedure: BIOPSY;  Surgeon: Lemar Lofty., MD;  Location: [facility];  Service: Gastroenterology;;   BREAST BIOPSY Bilateral 10+ yrs ago   BENIGN   BUNIONECTOMY Left 1975   Left Toe   CATARACT EXTRACTION, BILATERAL  2009   COLONOSCOPY WITH PROPOFOL N/A 02/15/2021   Procedure: COLONOSCOPY WITH PROPOFOL;  Surgeon: Lemar Lofty., MD;  Location: [facility];  Service: Gastroenterology;  Laterality: N/A;  ultraslimscope    DILATION AND CURETTAGE OF UTERUS  1972   eye PRK vision correction Left 10/09/2013   eye socket re-formed Left 2014   FOOT NEUROMA SURGERY Right 2003   plus bunionectomy, right great toe   HAMMER TOE SURGERY Right  2005   Right foot correction hammertoe little toe   HEMORRHOID SURGERY  1967   HIP SURGERY Right 2014   right hip femur pin implanted   KNEE ARTHROSCOPY Left 2010   debridement left knee scar tissue   LAPAROSCOPIC OOPHERECTOMY     1986, 1990   LASIK  1998   NEUROMA SURGERY Right 2003   Right Foot Neuroma, plus Bunionectomy, Right Great Toe   POLYPECTOMY  02/15/2021   Procedure: POLYPECTOMY;  Surgeon: Mansouraty, Netty Starring., MD;  Location: [facility];  Service: Gastroenterology;;   RECTOCELE REPAIR  1968   REPLACEMENT TOTAL KNEE Left 01/2008   REPLACEMENT TOTAL KNEE Right 08/2008   retinal pucker correction Left 07/14/2012   schwannoma tumor (benign) removal  2011   right rib   SIGMOIDOSCOPY     TONSILLECTOMY AND ADENOIDECTOMY     VENTRAL HERNIA REPAIR  1993   Patient Active Problem List   Diagnosis Date Noted   Chronic pain of right knee 05/27/2021   OSA on CPAP 05/27/2021   LLQ pain 12/16/2020   History of diverticulitis 12/16/2020   Hematochezia 12/16/2020   Abnormal colonoscopy 12/16/2020   Diverticulitis of colon 08/15/2020   Sleep-related  hypoxia 08/05/2019   OSA (obstructive sleep apnea) 08/05/2019   PLMD (periodic limb movement disorder) 08/05/2019   Cerebellar 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) 05/06/2019   Excessive daytime sleepiness 05/06/2019   Loud snoring 05/06/2019   Ventral hernia without obstruction or gangrene 02/22/2019   Chronic throat clearing 01/18/2019   Nocturnal cough 01/18/2019   Family history of colon cancer in father 01/18/2019   Diverticulosis 01/18/2019   Gait abnormality 12/27/2018   Numbness 12/27/2018   Status post total bilateral knee replacement 11/30/2018   DDD (degenerative disc disease), cervical 11/30/2018   Status post carpal tunnel release 11/30/2018   DDD (degenerative disc disease), lumbar 10/26/2018   Primary osteoarthritis of both hands 10/26/2018   Primary osteoarthritis of both feet 10/26/2018    Gastroesophageal reflux disease 08/27/2018   Bilateral impacted cerumen 08/21/2018   Sensorineural hearing loss (SNHL) of both ears 08/21/2018   Tinnitus, bilateral 08/21/2018   Blood in urine 04/17/2018   Secondary open-angle glaucoma of left eye, moderate stage 03/10/2018   Early stage 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 10/05/2017   Epiretinal membrane (ERM) of left eye 10/05/2017   Pseudophakia of both eyes 10/05/2017   Osteopenia 03/19/2017   Mixed hyperlipidemia 08/18/2016   Generalized osteoarthritis of multiple sites 08/18/2016   Peripheral neuropathic pain 08/18/2016   Overactive bladder 08/18/2016   Glaucoma 08/18/2016   Cervical disc disorder with radiculopathy of cervical region 08/17/2016   Carpal tunnel syndrome, right upper limb 08/17/2016   Abnormal finding on mammography 06/14/2016   Breast lump 05/06/2015    PCP: Garlan Fillers, MD  REFERRING PROVIDER: Garlan Fillers, MD   REFERRING DIAG: Unsteadiness on feet [R26.81]   THERAPY DIAG:  Muscle weakness (generalized)  Other abnormalities of gait and mobility  Unsteadiness on feet  Rationale for Evaluation and Treatment: Rehabilitation  ONSET DATE: 2 years ago  SUBJECTIVE:   SUBJECTIVE STATEMENT: Pt denies any adverse effects after prior Rx.  She has difficulty with standing up from pew at church.  Pt continues to have foot slap with gait.  Pt states she doesn't lean as much on her cane anymore.  Pt has been a performing aquatic exercise class 3x/wk and performing some of her PT exercises in the pool.  Pt has not performed resisted DF with T band with daughter yet.          PERTINENT HISTORY: Arthritis, Osteopenia, Peripheral neuropathy, and bilat TKR. PAIN:  Are you having pain? No Pt denies any knee or hip pain.   PRECAUTIONS: None  WEIGHT BEARING RESTRICTIONS: No  FALLS:  Has patient fallen in last 6 months? Yes. Number of falls 2  LIVING ENVIRONMENT: Lives  with: lives alone Lives in: House/apartment Stairs: No Has following equipment at home: Single point cane, shower chair, and Grab bars  OCCUPATION: Retired  PLOF: Independent  PATIENT GOALS: Pt would like to improve her strength in bilat LE's and improve her balance.   NEXT MD VISIT:   OBJECTIVE:   DIAGNOSTIC FINDINGS: None at this time.     TODAY'S TREATMENT:  Ther Ex: Pt performed:  DF with RTB 3x10 bilat Sit to stands with GTB around thighs 3x10 Heel raises 2x10 Lateral band walks with GTB around thighs x 3 sets at rail without UE support   PT spent time educated pt concerning HEP including reviewing and updating HEP.  Pt received an updated HEP handout was educated in correct form and appropriate frequency.  Pt was instructed in using a counter or sturdy surface in front of her for support, balance, and safety with HEP     Neuromuscular Re-education: Walking with head turns without AD with CGA/SBA down hallway  Tandem gait x 3-4 laps with UE support  Tandem stance 2x30 sec each with UE support      PATIENT EDUCATION:  Education details:  POC, exercise form, HEP, and exercise rationale. Person educated: Patient Education method: Medical illustrator, verbal cues Education comprehension: verbalized understanding and returned demonstration, verbal cues required  HOME EXERCISE PROGRAM: Access Code: [email or web address] URL: [email or web address] Date: 11/10/2022 Prepared by: Aaron Edelman   ASSESSMENT:  CLINICAL IMPRESSION:  PT spent time reviewing and updating HEP.  Pt demonstrates good understanding though could benefit from 1 more PT Rx to educate pt concerning HEP and ensure independence.  Pt is going to bring all of her HEP handouts next visit in order to go through them and ensure independence with program.  Pt performed  exercises well.  She demonstrates good stability with lateral band walks.  She didn't have LOB and did not use UE's for support with lateral band walks.  Pt requires UE support with tandem gait and tandem stance.  Pt continues to have decreased foot clearance and increased foot slap with gait.  She gives great effort with exercises/activities and responded well to Rx having no c/o's after Rx.     OBJECTIVE IMPAIRMENTS: decreased activity tolerance, difficulty walking, decreased balance, decreased endurance, decreased mobility, decreased ROM, decreased strength, impaired flexibility, impaired UE/LE use, postural dysfunction, and pain.  ACTIVITY LIMITATIONS: bending, lifting, carry, locomotion, cleaning, community activity, driving, and or occupation  PERSONAL FACTORS: Arthritis, Osteopenia, Peripheral neuropathy.  are also affecting patient's functional outcome.  REHAB POTENTIAL: Good  CLINICAL DECISION MAKING: Stable/uncomplicated  EVALUATION COMPLEXITY: Low   GOALS: Short term PT Goals Target date:11/29/2022 Pt will be I and compliant with HEP. Baseline:  Goal status: MET  Long term PT goals Target date: 02/13/2023 Pt will improve BERG by at least 3 points in order to demonstrate clinically significant improvement in balance.   Baseline:  Goal status: GOAL MET Pt will improve hip/knee strength to at least 5-/5 MMT to improve functional strength Baseline: Goal status: PARTIALLY MET Target dat:  05/08/23 Pt will report <1 fall during her duration in PT.         Baseline         Goal status: GOAL MET Pt will be able to ambulate community distances at least 1000 ft WNL gait pattern without complaints Baseline: Goal status: PROGRESSING Target dat:  05/08/23       5. Pt will be able to negotiate stairs with a reciprocal gait pattern       Baseline:        Goal status: GOAL MET WITH THE USAGE OF THE RAIL Target dat:  03/28/23       6. Pt will improve her STS by 2.3 seconds for Minimal  clinically important difference.         Baseline:          Goal status:  GOAL MET      7.  Pt will be independent with advanced HEP to maximize functional strength gains and balance for improved mobility and decreased fall risk.     Goal status:  PROGRESSING Target dat:  05/08/23   PLAN: PT FREQUENCY: 1 times per week   PT DURATION: 5-6 weeks  PLANNED INTERVENTIONS (unless contraindicated): aquatic PT, Canalith repositioning, cryotherapy, Electrical stimulation, Iontophoresis with 4 mg/ml dexamethasome, Moist heat, traction, Ultrasound, gait training, Therapeutic exercise, balance training, neuromuscular re-education, patient/family education, prosthetic training, manual techniques, passive ROM, dry needling, taping, vasopnuematic device, vestibular, spinal manipulations, joint manipulations  PLAN FOR NEXT SESSION:  1 more visit.  Finalize HEP and discharge Pt next visit.  Audie Clear III PT, DPT 04/24/23 11:00 PM

## 2023-05-03 ENCOUNTER — Encounter (HOSPITAL_BASED_OUTPATIENT_CLINIC_OR_DEPARTMENT_OTHER): Payer: Self-pay | Admitting: Physical Therapy

## 2023-05-03 ENCOUNTER — Ambulatory Visit (HOSPITAL_BASED_OUTPATIENT_CLINIC_OR_DEPARTMENT_OTHER): Payer: Medicare Other | Admitting: Physical Therapy

## 2023-05-03 DIAGNOSIS — R2681 Unsteadiness on feet: Secondary | ICD-10-CM

## 2023-05-03 DIAGNOSIS — M6281 Muscle weakness (generalized): Secondary | ICD-10-CM | POA: Diagnosis not present

## 2023-05-03 DIAGNOSIS — R2689 Other abnormalities of gait and mobility: Secondary | ICD-10-CM

## 2023-05-03 NOTE — Therapy (Signed)
OUTPATIENT PHYSICAL THERAPY LOWER EXTREMITY TREATMENT .         Patient Name: Hailey Perkins MRN: [id number] DOB:Jan 12, 1935, 88 y.o., female Today's Date: 05/04/2023  END OF SESSION:  PT End of Session - 05/03/2023      Visit Number 33   Number of Visits 33    Date for PT Re-Evaluation 05/08/23    Authorization Type [employer]    PT Start Time 1024   PT Stop Time 1057   PT Time Calculation (min) 33 min    Equipment Utilized During Treatment     Activity Tolerance Patient tolerated treatment well    Behavior During Therapy WFL for tasks assessed/performed                      Past Medical History:  Diagnosis Date   Aortic atherosclerosis (HCC)    Arthritis    Cataract 2010   bilateral eyes   Chicken pox    Cholelithiasis    Diverticulitis    Diverticulosis    GERD (gastroesophageal reflux disease)    Glaucoma    Hiatal hernia    History of frequent urinary tract infections    Hyperlipidemia    Osteopenia    Peripheral neuropathy    Sleep apnea    wears a CPAP   Urine incontinence    Past Surgical History:  Procedure Laterality Date   ABDOMINAL HYSTERECTOMY  1977   ANAL FISTULECTOMY  1985   ANKLE FRACTURE SURGERY Right 2014   APPENDECTOMY     BIOPSY  02/15/2021   Procedure: BIOPSY;  Surgeon: Lemar Lofty., MD;  Location: [facility];  Service: Gastroenterology;;   BREAST BIOPSY Bilateral 10+ yrs ago   BENIGN   BUNIONECTOMY Left 1975   Left Toe   CATARACT EXTRACTION, BILATERAL  2009   COLONOSCOPY WITH PROPOFOL N/A 02/15/2021   Procedure: COLONOSCOPY WITH PROPOFOL;  Surgeon: Lemar Lofty., MD;  Location: [facility];  Service: Gastroenterology;  Laterality: N/A;  ultraslimscope    DILATION AND CURETTAGE OF UTERUS  1972   eye PRK vision correction Left 10/09/2013   eye socket re-formed Left 2014   FOOT NEUROMA SURGERY Right 2003   plus bunionectomy, right great toe   HAMMER TOE SURGERY Right 2005   Right  foot correction hammertoe little toe   HEMORRHOID SURGERY  1967   HIP SURGERY Right 2014   right hip femur pin implanted   KNEE ARTHROSCOPY Left 2010   debridement left knee scar tissue   LAPAROSCOPIC OOPHERECTOMY     1986, 1990   LASIK  1998   NEUROMA SURGERY Right 2003   Right Foot Neuroma, plus Bunionectomy, Right Great Toe   POLYPECTOMY  02/15/2021   Procedure: POLYPECTOMY;  Surgeon: Lemar Lofty., MD;  Location: [facility];  Service: Gastroenterology;;   RECTOCELE REPAIR  1968   REPLACEMENT TOTAL KNEE Left 01/2008   REPLACEMENT TOTAL KNEE Right 08/2008   retinal pucker correction Left 07/14/2012   schwannoma tumor (benign) removal  2011   right rib   SIGMOIDOSCOPY     TONSILLECTOMY AND ADENOIDECTOMY     VENTRAL HERNIA REPAIR  1993   Patient Active Problem List   Diagnosis Date Noted   Chronic pain of right knee 05/27/2021   OSA on CPAP 05/27/2021   LLQ pain 12/16/2020   History of diverticulitis 12/16/2020   Hematochezia 12/16/2020   Abnormal colonoscopy 12/16/2020   Diverticulitis of colon 08/15/2020   Sleep-related hypoxia 08/05/2019  OSA (obstructive sleep apnea) 08/05/2019   PLMD (periodic limb movement disorder) 08/05/2019   Cerebellar 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) 05/06/2019   Excessive daytime sleepiness 05/06/2019   Loud snoring 05/06/2019   Ventral hernia without obstruction or gangrene 02/22/2019   Chronic throat clearing 01/18/2019   Nocturnal cough 01/18/2019   Family history of colon cancer in father 01/18/2019   Diverticulosis 01/18/2019   Gait abnormality 12/27/2018   Numbness 12/27/2018   Status post total bilateral knee replacement 11/30/2018   DDD (degenerative disc disease), cervical 11/30/2018   Status post carpal tunnel release 11/30/2018   DDD (degenerative disc disease), lumbar 10/26/2018   Primary osteoarthritis of both hands 10/26/2018   Primary osteoarthritis of both feet 10/26/2018   Gastroesophageal  reflux disease 08/27/2018   Bilateral impacted cerumen 08/21/2018   Sensorineural hearing loss (SNHL) of both ears 08/21/2018   Tinnitus, bilateral 08/21/2018   Blood in urine 04/17/2018   Secondary open-angle glaucoma of left eye, moderate stage 03/10/2018   Early stage 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 10/05/2017   Epiretinal membrane (ERM) of left eye 10/05/2017   Pseudophakia of both eyes 10/05/2017   Osteopenia 03/19/2017   Mixed hyperlipidemia 08/18/2016   Generalized osteoarthritis of multiple sites 08/18/2016   Peripheral neuropathic pain 08/18/2016   Overactive bladder 08/18/2016   Glaucoma 08/18/2016   Cervical disc disorder with radiculopathy of cervical region 08/17/2016   Carpal tunnel syndrome, right upper limb 08/17/2016   Abnormal finding on mammography 06/14/2016   Breast lump 05/06/2015    PCP: Garlan Fillers, MD  REFERRING PROVIDER: Garlan Fillers, MD   REFERRING DIAG: Unsteadiness on feet [R26.81]   THERAPY DIAG:  Muscle weakness (generalized)  Other abnormalities of gait and mobility  Unsteadiness on feet  Rationale for Evaluation and Treatment: Rehabilitation  ONSET DATE: 2 years ago  SUBJECTIVE:   SUBJECTIVE STATEMENT: Pt denies any adverse effects after prior Rx.  Pt hasn't been able to perform her HEP due to traveling.  Pt states her L knee is bothering her today.  She has difficulty with standing up from pew at church.  Pt continues to have foot slap with gait.  Pt states she doesn't lean as much on her cane anymore.  Pt has been a performing aquatic exercise class 3x/wk and performing some of her PT exercises in the pool.  Pt has not performed resisted DF with T band with daughter yet.  Pt is appreciative of skilled PT services.         PERTINENT HISTORY: Arthritis, Osteopenia, Peripheral neuropathy, and bilat TKR. PAIN:  0/10 L knee pain at rest and 3/10 pain with certain movements.   PRECAUTIONS:  None  WEIGHT BEARING RESTRICTIONS: No  FALLS:  Has patient fallen in last 6 months? Yes. Number of falls 2  LIVING ENVIRONMENT: Lives with: lives alone Lives in: House/apartment Stairs: No Has following equipment at home: Single point cane, shower chair, and Grab bars  OCCUPATION: Retired  PLOF: Independent  PATIENT GOALS: Pt would like to improve her strength in bilat LE's and improve her balance.   NEXT MD VISIT:   OBJECTIVE:   DIAGNOSTIC FINDINGS: None at this time.     TODAY'S TREATMENT:  Neuromuscular Re-education:  Tandem gait x 1 lap with UE support  Tandem stance 2-3 reps each x 20 sec each with UE support Standing on airex with FT without UE support x 30 sec  PT thoroughly went through HEP and instructed pt in correct exercises and appropriate frequency.  Pt received an updated HEP handout was educated in correct form and appropriate frequency.  Pt was instructed in using a counter or sturdy surface in front of her for support, balance, and safety with HEP.  ABC scale:  prior/Current:  84% /  80%.  Pt didn't fill out #13.      PATIENT EDUCATION:  Education details:  POC/discharge planning, exercise form, and HEP.  PT answered pt's questions. Person educated: Patient Education method: Medical illustrator, handout Education comprehension: verbalized understanding and returned demonstration  HOME EXERCISE PROGRAM: Access Code:                            URL: [email or web address] Date: 11/10/2022 Prepared by: Aaron Edelman  Updated HEP:   - Standing Tandem Balance with Counter Support  - 1 x daily - 3-4 x weekly - 2 reps - 20 seconds hold   ASSESSMENT:  CLINICAL IMPRESSION:  Pt brought in her HEP handouts and PT extensively went through her handouts condensing and updating exercises appropriately.  PT  thoroughly went through her HEP and educated pt concerning exercises, progression, exercise form, safety, and appropriate frequency/sets/reps.  Pt demonstrates good understanding and is independent with HEP.  Pt has made good progress overall.  She continues to have increased foot slap with gait though has made much improvement with balance and stability.  Pt has met STG #1 and LTG's # 1,3,5,6 and 7.  Pt partially met LTG #2.  She is ready for discharge.   OBJECTIVE IMPAIRMENTS: decreased activity tolerance, difficulty walking, decreased balance, decreased endurance, decreased mobility, decreased ROM, decreased strength, impaired flexibility, impaired UE/LE use, postural dysfunction, and pain.  ACTIVITY LIMITATIONS: bending, lifting, carry, locomotion, cleaning, community activity, driving, and or occupation  PERSONAL FACTORS: Arthritis, Osteopenia, Peripheral neuropathy.  are also affecting patient's functional outcome.  REHAB POTENTIAL: Good  CLINICAL DECISION MAKING: Stable/uncomplicated  EVALUATION COMPLEXITY: Low   GOALS: Short term PT Goals Target date:11/29/2022 Pt will be I and compliant with HEP. Baseline:  Goal status: MET  Long term PT goals Target date: 02/13/2023 Pt will improve BERG by at least 3 points in order to demonstrate clinically significant improvement in balance.   Baseline:  Goal status: GOAL MET Pt will improve hip/knee strength to at least 5-/5 MMT to improve functional strength Baseline: Goal status: PARTIALLY MET Target dat:  05/08/23 Pt will report <1 fall during her duration in PT.         Baseline         Goal status: GOAL MET Pt will be able to ambulate community distances at least 1000 ft WNL gait pattern without complaints Baseline: Goal status: improved, but not me Target dat:  05/08/23       5. Pt will be able to negotiate stairs with a reciprocal gait pattern       Baseline:        Goal status: GOAL MET WITH THE USAGE OF THE RAIL Target dat:   03/28/23       6. Pt will improve her STS by 2.3 seconds for Minimal clinically important difference.         Baseline:  Goal status: GOAL MET      7.  Pt will be independent with advanced HEP to maximize functional strength gains and balance for improved mobility and decreased fall risk.     Goal status:  GOAL MET Target dat:  05/08/23   PLAN:  PLANNED INTERVENTIONS (unless contraindicated): aquatic PT, Canalith repositioning, cryotherapy, Electrical stimulation, Iontophoresis with 4 mg/ml dexamethasome, Moist heat, traction, Ultrasound, gait training, Therapeutic exercise, balance training, neuromuscular re-education, patient/family education, prosthetic training, manual techniques, passive ROM, dry needling, taping, vasopnuematic device, vestibular, spinal manipulations, joint manipulations  PLAN FOR NEXT SESSION:  Pt to be discharged from skilled PT services due to meeting the majority of her goals and being independent with HEP.  She is agreeable with discharge and will cont with HEP.    PHYSICAL THERAPY DISCHARGE SUMMARY  Visits from Start of Care: 33  Current functional level related to goals / functional outcomes: See above   Remaining deficits: See above   Education / Equipment: See above    Audie Clear III PT, DPT 05/04/23 8:37 AM

## 2023-08-16 ENCOUNTER — Other Ambulatory Visit (HOSPITAL_BASED_OUTPATIENT_CLINIC_OR_DEPARTMENT_OTHER): Payer: Self-pay

## 2023-08-16 MED ORDER — COMIRNATY 30 MCG/0.3ML IM SUSY
0.3000 mL | PREFILLED_SYRINGE | Freq: Once | INTRAMUSCULAR | 0 refills | Status: AC
  Filled 2023-08-16: qty 0.3, 1d supply, fill #0
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
## 2023-08-28 ENCOUNTER — Ambulatory Visit: Payer: Medicare Other | Admitting: Family Medicine

## 2023-08-28 DIAGNOSIS — G4733 Obstructive sleep apnea (adult) (pediatric): Secondary | ICD-10-CM

## 2023-08-29 ENCOUNTER — Ambulatory Visit: Payer: Medicare Other | Admitting: Family Medicine

## 2023-08-29 ENCOUNTER — Encounter: Payer: Self-pay | Admitting: Family Medicine

## 2023-08-29 VITALS — BP 128/92 | HR 82 | Ht 63.0 in | Wt 169.5 lb

## 2023-08-29 DIAGNOSIS — G4733 Obstructive sleep apnea (adult) (pediatric): Secondary | ICD-10-CM

## 2023-08-29 NOTE — Patient Instructions (Signed)
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
## 2023-09-21 ENCOUNTER — Ambulatory Visit: Payer: Medicare Other | Admitting: Family Medicine

## 2023-09-29 ENCOUNTER — Encounter: Payer: Self-pay | Admitting: Family Medicine

## 2023-12-26 ENCOUNTER — Other Ambulatory Visit (HOSPITAL_BASED_OUTPATIENT_CLINIC_OR_DEPARTMENT_OTHER): Payer: Self-pay

## 2024-01-08 ENCOUNTER — Ambulatory Visit: Payer: Medicare Other | Admitting: Family Medicine

## 2024-02-06 ENCOUNTER — Other Ambulatory Visit (HOSPITAL_BASED_OUTPATIENT_CLINIC_OR_DEPARTMENT_OTHER): Payer: Self-pay

## 2024-02-06 MED ORDER — CAPVAXIVE 0.5 ML IM SOSY
0.5000 mL | PREFILLED_SYRINGE | Freq: Once | INTRAMUSCULAR | 0 refills | Status: AC
  Filled 2024-02-06: qty 0.5, 1d supply, fill #0

## 2024-03-07 ENCOUNTER — Ambulatory Visit (HOSPITAL_COMMUNITY)
Admission: RE | Admit: 2024-03-07 | Discharge: 2024-03-07 | Disposition: A | Discharge status: Home / Self Care | Source: Ambulatory Visit | Attending: Vascular Surgery | Admitting: Vascular Surgery

## 2024-03-07 ENCOUNTER — Other Ambulatory Visit (HOSPITAL_COMMUNITY): Payer: Self-pay | Admitting: Family Medicine

## 2024-03-07 DIAGNOSIS — M79662 Pain in left lower leg: Secondary | ICD-10-CM | POA: Diagnosis present

## 2024-04-03 ENCOUNTER — Other Ambulatory Visit: Payer: Self-pay | Admitting: Nurse Practitioner

## 2024-04-03 DIAGNOSIS — M85852 Other specified disorders of bone density and structure, left thigh: Secondary | ICD-10-CM

## 2024-04-03 DIAGNOSIS — Z1231 Encounter for screening mammogram for malignant neoplasm of breast: Secondary | ICD-10-CM

## 2024-04-08 ENCOUNTER — Encounter: Payer: Self-pay | Admitting: Podiatry

## 2024-04-08 ENCOUNTER — Ambulatory Visit: Admitting: Podiatry

## 2024-04-08 DIAGNOSIS — M2041 Other hammer toe(s) (acquired), right foot: Secondary | ICD-10-CM

## 2024-04-08 DIAGNOSIS — L600 Ingrowing nail: Secondary | ICD-10-CM | POA: Diagnosis not present

## 2024-04-08 DIAGNOSIS — M2042 Other hammer toe(s) (acquired), left foot: Secondary | ICD-10-CM | POA: Diagnosis not present

## 2024-04-08 NOTE — Progress Notes (Signed)
  Subjective:  Patient ID: Hailey Perkins, female    DOB: 09-14-1935,   MRN: [id number]  Chief Complaint  Patient presents with   Toe Pain    Hallux left - medial border, possible ingrown, can't wear closed toe shoes due to pressure  Toes right - concerned about hammertoes worsening   New Patient (Initial Visit)    Est pt 11/2021    89 y.o. female presents for concern as above. Relates the ingrown toenail has begcome more painfula and the hammertoes are worsening.  . Denies any other pedal complaints. Denies n/v/f/c.   Past Medical History:  Diagnosis Date   Aortic atherosclerosis (HCC)    Arthritis    Cataract 2010   bilateral eyes   Chicken pox    Cholelithiasis    Diverticulitis    Diverticulosis    GERD (gastroesophageal reflux disease)    Glaucoma    Hiatal hernia    History of frequent urinary tract infections    Hyperlipidemia    Osteopenia    Peripheral neuropathy    Sleep apnea    wears a CPAP   Urine incontinence     Objective:  Physical Exam: Vascular: DP/PT pulses 2/4 bilateral. CFT <3 seconds. Normal hair growth on digits. No edema.  Skin. No lacerations or abrasions bilateral feet. Incurvation of medial border of left hallux with tenderness to palpation. No erythema edema or purulence noted.  Musculoskeletal: MMT 5/5 bilateral lower extremities in DF, PF, Inversion and Eversion. Deceased ROM in DF of ankle joint.  Neurological: Sensation intact to light touch.   Assessment:   1. Ingrown nail   2. Hammertoes of both feet      Plan:  Patient was evaluated and treated and all questions answered. Discussed ingrown toenails etiology and treatment options including procedure for removal vs conservative care.  Patient requesting conservative treatment and nail debrided in slant back fashion without incident and covered with neosporin and a bandaid.  -Educated on hammertoes and treatment options  -Discussed padding including toe caps and crest pads.   -Discussed need for flexor tenotomy and discussed periprocedure course. Patient would like to proceed with this.  -Patient to follow-up at scheduled time for tenotomy procedure.     Jennefer Moats, DPM

## 2024-04-09 ENCOUNTER — Ambulatory Visit

## 2024-04-09 ENCOUNTER — Ambulatory Visit
Admission: RE | Admit: 2024-04-09 | Discharge: 2024-04-09 | Disposition: A | Discharge status: Home / Self Care | Source: Ambulatory Visit | Attending: Nurse Practitioner | Admitting: Nurse Practitioner

## 2024-04-09 DIAGNOSIS — Z1231 Encounter for screening mammogram for malignant neoplasm of breast: Secondary | ICD-10-CM

## 2024-05-01 ENCOUNTER — Encounter: Payer: Self-pay | Admitting: Podiatry

## 2024-05-01 ENCOUNTER — Ambulatory Visit: Admitting: Podiatry

## 2024-05-01 DIAGNOSIS — M2041 Other hammer toe(s) (acquired), right foot: Secondary | ICD-10-CM | POA: Diagnosis not present

## 2024-05-01 DIAGNOSIS — M2042 Other hammer toe(s) (acquired), left foot: Secondary | ICD-10-CM | POA: Diagnosis not present

## 2024-05-01 NOTE — Progress Notes (Signed)
  Subjective:  Patient ID: Hailey Perkins, female    DOB: 21-Mar-1935,   MRN: [id number]  Chief Complaint  Patient presents with   Toe Pain    Pt stated that she is here for a flexor tenotomy     89 y.o. female presents for follow-up and for right toes flexor tenotomy. .  . Denies any other pedal complaints. Denies n/v/f/c.   Past Medical History:  Diagnosis Date   Aortic atherosclerosis (HCC)    Arthritis    Cataract 2010   bilateral eyes   Chicken pox    Cholelithiasis    Diverticulitis    Diverticulosis    GERD (gastroesophageal reflux disease)    Glaucoma    Hiatal hernia    History of frequent urinary tract infections    Hyperlipidemia    Osteopenia    Peripheral neuropathy    Sleep apnea    wears a CPAP   Urine incontinence     Objective:  Physical Exam: Vascular: DP/PT pulses 2/4 bilateral. CFT <3 seconds. Normal hair growth on digits. No edema.  Skin. No lacerations or abrasions bilateral feet. Incurvation of medial border of left hallux with tenderness to palpation. No erythema edema or purulence noted.  Musculoskeletal: MMT 5/5 bilateral lower extremities in DF, PF, Inversion and Eversion. Deceased ROM in DF of ankle joint. Flexible hammered digits 2-5 bilateral worse on right.  Neurological: Sensation intact to light touch.   Assessment:   1. Hammertoes of both feet       Plan:  Patient was evaluated and treated and all questions answered. Here today for flexor tenotomy procedure.   Procedure Percutaneous Flexor Tenotomy ([id number]): Procedure: Right second third and fourth  digit flexor tenotomy  Surgeon: Jennefer Moats, DPM Pre-op Dx: Right second third and fourth.  digit hammertoe with digit ulceration Post-op: Same Place of Surgery: Office surgical suite  Indications for surgery: Flexible hammertoe deformity.  Findings: Digit was able to be straightened after tenotomy preformed.   Today, the risks, benefits, and complications of this procedure  were explained, and written consent was obtained. The patient was then placed in a semi-reclined position, an the foot was prepped and draped in the usual aseptic manner. Attention was directed to the offending toe. Local anesthetic of 3 ml of 1% lidocaine plain was administered. An 18 gauge needed was used in the plantar aspect of the digit and tenotomy was preformed. Straightening of the toe was noted. Hemostasis was obtained by use of compression. Area was copiously irrigated. Digit was splinted with betadine gauze and dry dressing.   Written and oral post-op instructions were given to the patient for the following home care:  1)Remove dressing tomorrow  2) Do not soak the area  3) Cover toe with band-aid if needed 4)Notify office if toe fails to improve or if there is an increased in redness or swelling.     Jennefer Moats, DPM

## 2024-05-08 ENCOUNTER — Ambulatory Visit: Admitting: Podiatry

## 2024-05-08 ENCOUNTER — Encounter: Payer: Self-pay | Admitting: Podiatry

## 2024-05-08 DIAGNOSIS — M2042 Other hammer toe(s) (acquired), left foot: Secondary | ICD-10-CM

## 2024-05-08 DIAGNOSIS — M2041 Other hammer toe(s) (acquired), right foot: Secondary | ICD-10-CM

## 2024-05-08 NOTE — Progress Notes (Signed)
  Subjective:  Patient ID: Judson November, female    DOB: 27-Jan-1935,   MRN: [id number]  Chief Complaint  Patient presents with   Routine Post Op    Patient is here for post-op follow-up return flexor tenotomy of right foot. Pt states no pain, uses a wipe to cleanse area and wrap daily. Pt would like to know how long it will take before toes straighten out?    89 y.o. female presents for follow-up of right foot flexor tenotomy. .  . Denies any other pedal complaints. Denies n/v/f/c.   Past Medical History:  Diagnosis Date   Aortic atherosclerosis (HCC)    Arthritis    Cataract 2010   bilateral eyes   Chicken pox    Cholelithiasis    Diverticulitis    Diverticulosis    GERD (gastroesophageal reflux disease)    Glaucoma    Hiatal hernia    History of frequent urinary tract infections    Hyperlipidemia    Osteopenia    Peripheral neuropathy    Sleep apnea    wears a CPAP   Urine incontinence     Objective:  Physical Exam: Vascular: DP/PT pulses 2/4 bilateral. CFT <3 seconds. Normal hair growth on digits. No edema.  Skin. No lacerations or abrasions bilateral feet. Incurvation of medial border of left hallux with tenderness to palpation. No erythema edema or purulence noted.  Musculoskeletal: MMT 5/5 bilateral lower extremities in DF, PF, Inversion and Eversion. Deceased ROM in DF of ankle joint. Flexible hammered digits 2-5 bilateral worse on right. Improved position of right mostly third and fourth digits. Second some mild improvement.  Neurological: Sensation intact to light touch.   Assessment:   1. Hammertoes of both feet        Plan:  Patient was evaluated and treated and all questions answered. Toe was evaluated and appears to be healing well.  Advised to continue walking as tolerated and manually straighten toe to prevent scarring in.  Patient to follow-up as needed.       Jennefer Moats, DPM

## 2024-05-15 ENCOUNTER — Other Ambulatory Visit: Payer: Self-pay

## 2024-05-15 ENCOUNTER — Ambulatory Visit (HOSPITAL_BASED_OUTPATIENT_CLINIC_OR_DEPARTMENT_OTHER): Attending: Internal Medicine | Admitting: Physical Therapy

## 2024-05-15 DIAGNOSIS — R2681 Unsteadiness on feet: Secondary | ICD-10-CM | POA: Insufficient documentation

## 2024-05-15 DIAGNOSIS — R2689 Other abnormalities of gait and mobility: Secondary | ICD-10-CM | POA: Insufficient documentation

## 2024-05-15 DIAGNOSIS — M6281 Muscle weakness (generalized): Secondary | ICD-10-CM | POA: Insufficient documentation

## 2024-05-15 NOTE — Therapy (Addendum)
 OUTPATIENT PHYSICAL THERAPY LOWER EXTREMITY EVALUATION   Patient Name: Hailey Perkins MRN: [id number] DOB:13-Sep-1935, 89 y.o., female Today's Date: 05/16/2024  END OF SESSION:  PT End of Session - 05/15/24 1115     Visit Number 1    Number of Visits 12    Date for PT Re-Evaluation 06/26/24    Authorization Type BCBS MCR    PT Start Time 1113    PT Stop Time 1157    PT Time Calculation (min) 44 min    Activity Tolerance Patient tolerated treatment well    Behavior During Therapy WFL for tasks assessed/performed          Past Medical History:  Diagnosis Date   Aortic atherosclerosis (HCC)    Arthritis    Cataract 2010   bilateral eyes   Chicken pox    Cholelithiasis    Diverticulitis    Diverticulosis    GERD (gastroesophageal reflux disease)    Glaucoma    Hiatal hernia    History of frequent urinary tract infections    Hyperlipidemia    Osteopenia    Peripheral neuropathy    Sleep apnea    wears a CPAP   Urine incontinence    Past Surgical History:  Procedure Laterality Date   ABDOMINAL HYSTERECTOMY  1977   ANAL FISTULECTOMY  1985   ANKLE FRACTURE SURGERY Right 2014   APPENDECTOMY     BIOPSY  02/15/2021   Procedure: BIOPSY;  Surgeon: Normie Becton., MD;  Location: [facility];  Service: Gastroenterology;;   BREAST BIOPSY Bilateral 10+ yrs ago   BENIGN   BUNIONECTOMY Left 1975   Left Toe   CATARACT EXTRACTION, BILATERAL  2009   COLONOSCOPY WITH PROPOFOL  N/A 02/15/2021   Procedure: COLONOSCOPY WITH PROPOFOL ;  Surgeon: Normie Becton., MD;  Location: [facility];  Service: Gastroenterology;  Laterality: N/A;  ultraslimscope    DILATION AND CURETTAGE OF UTERUS  1972   eye PRK vision correction Left 10/09/2013   eye socket re-formed Left 2014   FOOT NEUROMA SURGERY Right 2003   plus bunionectomy, right great toe   HAMMER TOE SURGERY Right 2005   Right foot correction hammertoe little toe   HEMORRHOID SURGERY  1967   HIP SURGERY Right  2014   right hip femur pin implanted   KNEE ARTHROSCOPY Left 2010   debridement left knee scar tissue   LAPAROSCOPIC OOPHERECTOMY     1986, 1990   LASIK  1998   NEUROMA SURGERY Right 2003   Right Foot Neuroma, plus Bunionectomy, Right Great Toe   POLYPECTOMY  02/15/2021   Procedure: POLYPECTOMY;  Surgeon: Normie Becton., MD;  Location: [facility];  Service: Gastroenterology;;   RECTOCELE REPAIR  1968   REPLACEMENT TOTAL KNEE Left 01/2008   REPLACEMENT TOTAL KNEE Right 08/2008   retinal pucker correction Left 07/14/2012   schwannoma tumor (benign) removal  2011   right rib   SIGMOIDOSCOPY     TONSILLECTOMY AND ADENOIDECTOMY     VENTRAL HERNIA REPAIR  1993   Patient Active Problem List   Diagnosis Date Noted   Arthralgia of right elbow 08/24/2022   Bilateral wrist pain 08/24/2022   Neck pain 08/24/2022   Pain of right hip joint 08/24/2022   Chronic pain of right knee 05/27/2021   OSA on CPAP 05/27/2021   LLQ pain 12/16/2020   History of diverticulitis 12/16/2020   Hematochezia 12/16/2020   Abnormal colonoscopy 12/16/2020   Acquired trigger finger of right index finger 10/26/2020  Other specified arthritis, right hand 10/26/2020   Diverticulitis of colon 08/15/2020   Dry eyes, bilateral 10/10/2019   Sleep-related hypoxia 08/05/2019   OSA (obstructive sleep apnea) 08/05/2019   PLMD (periodic limb movement disorder) 08/05/2019   Cerebellar 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) 05/06/2019   Excessive daytime sleepiness 05/06/2019   Loud snoring 05/06/2019   Ventral hernia without obstruction or gangrene 02/22/2019   Chronic throat clearing 01/18/2019   Nocturnal cough 01/18/2019   Family history of colon cancer in father 01/18/2019   Diverticulosis 01/18/2019   Gait abnormality 12/27/2018   Numbness 12/27/2018   Status post total bilateral knee replacement 11/30/2018   DDD (degenerative disc disease), cervical 11/30/2018   Status post carpal tunnel  release 11/30/2018   DDD (degenerative disc disease), lumbar 10/26/2018   Primary osteoarthritis of both hands 10/26/2018   Primary osteoarthritis of both feet 10/26/2018   Gastroesophageal reflux disease 08/27/2018   Bilateral impacted cerumen 08/21/2018   Sensorineural hearing loss (SNHL) of both ears 08/21/2018   Tinnitus, bilateral 08/21/2018   Blood in urine 04/17/2018   Secondary open-angle glaucoma of left eye, moderate stage 03/10/2018   Early stage 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 10/05/2017   Epiretinal membrane (ERM) of left eye 10/05/2017   Pseudophakia of both eyes 10/05/2017   Osteopenia 03/19/2017   Mixed hyperlipidemia 08/18/2016   Generalized osteoarthritis of multiple sites 08/18/2016   Peripheral neuropathic pain 08/18/2016   Overactive bladder 08/18/2016   Glaucoma 08/18/2016   Cervical disc disorder with radiculopathy of cervical region 08/17/2016   Carpal tunnel syndrome, right upper limb 08/17/2016   Abnormal finding on mammography 06/14/2016   Breast lump 05/06/2015    PCP: Bertha Broad, MD  REFERRING PROVIDER: Bertha Broad, MD  REFERRING DIAG: R26.81 (ICD-10-CM) - Unsteadiness on feet/Unsteady gait  THERAPY DIAG:  Other abnormalities of gait and mobility  Unsteadiness on feet  Muscle weakness (generalized)  Rationale for Evaluation and Treatment: Rehabilitation  ONSET DATE: 2021 ; PT order 04/04/2024  SUBJECTIVE:   SUBJECTIVE STATEMENT: Pt began having weakness in L foot in 2021 and difficulty clearing the floor with L foot.   Pt was seen in PT from November 2023 to May 2024.  Pt states she stopped performing her HEP.  Pt states she has fallen twice since last PT.  She fell in July and September 2024.  Pt states she loses her balance bwds and has to catch herself.  Pt reports weakness and has difficulty performing sit to stands.  Pt has difficulty with walking and loses her balance. Pt uses a SPC with  walking.  Pt has difficulty putting objects in the dishwasher.  Pt reports having balance deficits when bending over to perform household chores.  Pt still has some issues with clearing L foot.   PERTINENT HISTORY: Flexor tenotomy on 3 toes of R foot on 05/01/24--Pt reports she has no restrictions from MD. Arthritis, osteopenia, peripheral neuropathy Bilat TKR  PAIN:    PRECAUTIONS: Fall and Other: peripheral neuropathy   WEIGHT BEARING RESTRICTIONS: No  FALLS:  Has patient fallen in last 6 months? No  LIVING ENVIRONMENT: Lives with: lives alone Lives in:  1 story home Stairs: No Has following equipment at home: Single point cane, shower chair, and Grab bars  OCCUPATION:  Retired  PLOF: Independent  PATIENT GOALS: improved ability to pivot.  Improved walking on uneven terrain and performing stairs   OBJECTIVE:  Note: Objective measures were completed at Evaluation unless otherwise  noted.  DIAGNOSTIC FINDINGS: N/A  PATIENT SURVEYS:  ABC scale:  60%  COGNITION: Overall cognitive status: Within functional limits for tasks assessed      LOWER EXTREMITY MMT:  MMT Right eval Left eval  Hip flexion 4/5 4/5  Hip extension    Hip abduction 24.6 19.1  Hip adduction    Hip internal rotation    Hip external rotation    Knee flexion 5/5 seated 5/5 seated  Knee extension 4-/5 4-/5  Ankle dorsiflexion Tol min resistance 4/5  Ankle plantarflexion    Ankle inversion    Ankle eversion     (Blank rows = not tested)    FUNCTIONAL TESTS:  5x STS test:  15.35 without UE support TUG:  17.84 sec with SPC  GAIT: Comments: increased foot slap bilat L > R, decreased foot clearance bilat.  Pt ambulates with SPC                                                                                                                                TREATMENT:    See below for pt education  PATIENT EDUCATION:  Education details: rationale of interventions, POC, dx, objective  findings, relevant anatomy, and what to expect next treatment.   Person educated: Patient Education method: Explanation Education comprehension: verbalized understanding and needs further education  HOME EXERCISE PROGRAM: Pt has a HEP.  ASSESSMENT:  CLINICAL IMPRESSION: Patient is a 89 y.o. female with a dx of unsteady gait.  Pt received PT in 2023-2024 and made good progress in PT.  Pt ambulates with a SPC and still has issues clearing L foot.  Pt has decreased foot clearance bilat, L > R.  Pt has difficulty with walking and loses her balance.  Pt denies any falls in the past 6 months.  Pt reports weakness and has difficulty performing sit to stands.  Pt reports having balance deficits when bending over to perform household chores.  It required increased time to perform the TUG test indicating pt at fall risk.  Pt's strength has decreased since last time she was in PT.  Pt should benefit from skilled PT to improve balance, strength, stability, and functional mobility and to decrease fall risk.     OBJECTIVE IMPAIRMENTS: Abnormal gait, decreased activity tolerance, decreased balance, decreased endurance, decreased mobility, difficulty walking, and decreased strength.   ACTIVITY LIMITATIONS: standing, squatting, stairs, transfers, and locomotion level  PARTICIPATION LIMITATIONS: cleaning and community activity  PERSONAL FACTORS: 3+ comorbidities: Arthritis, bilat TKA, peripheral neuropathy are also affecting patient's functional outcome.   REHAB POTENTIAL: Good  CLINICAL DECISION MAKING: Stable/uncomplicated  EVALUATION COMPLEXITY: Low   GOALS:   SHORT TERM GOALS: Target date: 06/05/2024  Pt will be independent and compliant with HEP for improved strength, balance, stability, and mobility.  Baseline: Goal status: INITIAL  2.  Pt will demo improved time on TUG test by at least 5 seconds for improved mobility and reduced fall risk.  Baseline:  Goal status: INITIAL  3.  Pt will  report at least a 25% improvement in her balance.   Baseline:  Goal status: INITIAL    LONG TERM GOALS: Target date:  06/26/2024   Pt will complete the [facility] Test and PT will set a goal next visit.  Baseline:  Goal status: INITIAL  2.  Pt will be able to load and unload her dishwasher with good stability and good confidence.  Baseline:  Goal status: INITIAL  3.  Pt will demo improved bilat LE strength to 4+/5 in hip flexion, 5/5 in knee extension, 4-/5 in R ankle DF, and a 5# increase in L hip abduction for improved performance of functional mobility and performance of household chores.  Baseline:  Goal status: INITIAL  4.  Pt will be able to ascend and descend stairs with a reciprocal gait with the rail.   Baseline:  Goal status: INITIAL  5.  Pt will report she is able to perform her normal ambulation with cane with good stability and confidence and without LOB. Baseline:  Goal status: INITIAL   PLAN:  PT FREQUENCY: 2x/week  PT DURATION: 6 weeks  PLANNED INTERVENTIONS: 08657- PT Re-evaluation, 97750- Physical Performance Testing, 97110-Therapeutic exercises, 97530- Therapeutic activity, [id number]- Neuromuscular re-education, 97535- Self Care, 84696- Manual therapy, [phone]- Gait training, [phone]- Aquatic Therapy, [phone]- Electrical stimulation (unattended), Patient/Family education, Balance training, Stair training, Taping, DME instructions, Cryotherapy, and Moist heat  PLAN FOR NEXT SESSION: Berg balance assessment next visit.  Work on [employer].  LE strengthening and balance activities.  Sit to stands.   Trina Fujita III PT, DPT 05/16/24 11:20 PM

## 2024-05-16 ENCOUNTER — Encounter (HOSPITAL_BASED_OUTPATIENT_CLINIC_OR_DEPARTMENT_OTHER): Payer: Self-pay | Admitting: Physical Therapy

## 2024-05-17 ENCOUNTER — Encounter (HOSPITAL_BASED_OUTPATIENT_CLINIC_OR_DEPARTMENT_OTHER): Payer: Self-pay | Admitting: Physical Therapy

## 2024-05-17 ENCOUNTER — Ambulatory Visit (HOSPITAL_BASED_OUTPATIENT_CLINIC_OR_DEPARTMENT_OTHER): Admitting: Physical Therapy

## 2024-05-17 DIAGNOSIS — R2689 Other abnormalities of gait and mobility: Secondary | ICD-10-CM | POA: Diagnosis not present

## 2024-05-17 DIAGNOSIS — R2681 Unsteadiness on feet: Secondary | ICD-10-CM

## 2024-05-17 DIAGNOSIS — M6281 Muscle weakness (generalized): Secondary | ICD-10-CM

## 2024-05-17 NOTE — Therapy (Signed)
 OUTPATIENT PHYSICAL THERAPY LOWER EXTREMITY EVALUATION   Patient Name: Hailey Perkins MRN: [id number] DOB:02-27-1935, 89 y.o., female Today's Date: 05/17/2024  END OF SESSION:  PT End of Session - 05/17/24 0944     Visit Number 2    Number of Visits 12    Date for PT Re-Evaluation 06/26/24    Authorization Type BCBS MCR    PT Start Time 0940    PT Stop Time 1010    PT Time Calculation (min) 30 min    Activity Tolerance Patient tolerated treatment well    Behavior During Therapy WFL for tasks assessed/performed          Past Medical History:  Diagnosis Date   Aortic atherosclerosis (HCC)    Arthritis    Cataract 2010   bilateral eyes   Chicken pox    Cholelithiasis    Diverticulitis    Diverticulosis    GERD (gastroesophageal reflux disease)    Glaucoma    Hiatal hernia    History of frequent urinary tract infections    Hyperlipidemia    Osteopenia    Peripheral neuropathy    Sleep apnea    wears a CPAP   Urine incontinence    Past Surgical History:  Procedure Laterality Date   ABDOMINAL HYSTERECTOMY  1977   ANAL FISTULECTOMY  1985   ANKLE FRACTURE SURGERY Right 2014   APPENDECTOMY     BIOPSY  02/15/2021   Procedure: BIOPSY;  Surgeon: Normie Becton., MD;  Location: [facility];  Service: Gastroenterology;;   BREAST BIOPSY Bilateral 10+ yrs ago   BENIGN   BUNIONECTOMY Left 1975   Left Toe   CATARACT EXTRACTION, BILATERAL  2009   COLONOSCOPY WITH PROPOFOL  N/A 02/15/2021   Procedure: COLONOSCOPY WITH PROPOFOL ;  Surgeon: Normie Becton., MD;  Location: [facility];  Service: Gastroenterology;  Laterality: N/A;  ultraslimscope    DILATION AND CURETTAGE OF UTERUS  1972   eye PRK vision correction Left 10/09/2013   eye socket re-formed Left 2014   FOOT NEUROMA SURGERY Right 2003   plus bunionectomy, right great toe   HAMMER TOE SURGERY Right 2005   Right foot correction hammertoe little toe   HEMORRHOID SURGERY  1967   HIP SURGERY Right  2014   right hip femur pin implanted   KNEE ARTHROSCOPY Left 2010   debridement left knee scar tissue   LAPAROSCOPIC OOPHERECTOMY     1986, 1990   LASIK  1998   NEUROMA SURGERY Right 2003   Right Foot Neuroma, plus Bunionectomy, Right Great Toe   POLYPECTOMY  02/15/2021   Procedure: POLYPECTOMY;  Surgeon: Normie Becton., MD;  Location: [facility];  Service: Gastroenterology;;   RECTOCELE REPAIR  1968   REPLACEMENT TOTAL KNEE Left 01/2008   REPLACEMENT TOTAL KNEE Right 08/2008   retinal pucker correction Left 07/14/2012   schwannoma tumor (benign) removal  2011   right rib   SIGMOIDOSCOPY     TONSILLECTOMY AND ADENOIDECTOMY     VENTRAL HERNIA REPAIR  1993   Patient Active Problem List   Diagnosis Date Noted   Arthralgia of right elbow 08/24/2022   Bilateral wrist pain 08/24/2022   Neck pain 08/24/2022   Pain of right hip joint 08/24/2022   Chronic pain of right knee 05/27/2021   OSA on CPAP 05/27/2021   LLQ pain 12/16/2020   History of diverticulitis 12/16/2020   Hematochezia 12/16/2020   Abnormal colonoscopy 12/16/2020   Acquired trigger finger of right index finger 10/26/2020  Other specified arthritis, right hand 10/26/2020   Diverticulitis of colon 08/15/2020   Dry eyes, bilateral 10/10/2019   Sleep-related hypoxia 08/05/2019   OSA (obstructive sleep apnea) 08/05/2019   PLMD (periodic limb movement disorder) 08/05/2019   Cerebellar 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) 05/06/2019   Excessive daytime sleepiness 05/06/2019   Loud snoring 05/06/2019   Ventral hernia without obstruction or gangrene 02/22/2019   Chronic throat clearing 01/18/2019   Nocturnal cough 01/18/2019   Family history of colon cancer in father 01/18/2019   Diverticulosis 01/18/2019   Gait abnormality 12/27/2018   Numbness 12/27/2018   Status post total bilateral knee replacement 11/30/2018   DDD (degenerative disc disease), cervical 11/30/2018   Status post carpal tunnel  release 11/30/2018   DDD (degenerative disc disease), lumbar 10/26/2018   Primary osteoarthritis of both hands 10/26/2018   Primary osteoarthritis of both feet 10/26/2018   Gastroesophageal reflux disease 08/27/2018   Bilateral impacted cerumen 08/21/2018   Sensorineural hearing loss (SNHL) of both ears 08/21/2018   Tinnitus, bilateral 08/21/2018   Blood in urine 04/17/2018   Secondary open-angle glaucoma of left eye, moderate stage 03/10/2018   Early stage 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 10/05/2017   Epiretinal membrane (ERM) of left eye 10/05/2017   Pseudophakia of both eyes 10/05/2017   Osteopenia 03/19/2017   Mixed hyperlipidemia 08/18/2016   Generalized osteoarthritis of multiple sites 08/18/2016   Peripheral neuropathic pain 08/18/2016   Overactive bladder 08/18/2016   Glaucoma 08/18/2016   Cervical disc disorder with radiculopathy of cervical region 08/17/2016   Carpal tunnel syndrome, right upper limb 08/17/2016   Abnormal finding on mammography 06/14/2016   Breast lump 05/06/2015    PCP: Bertha Broad, MD  REFERRING PROVIDER: Bertha Broad, MD  REFERRING DIAG: R26.81 (ICD-10-CM) - Unsteadiness on feet/Unsteady gait  THERAPY DIAG:  Other abnormalities of gait and mobility  Unsteadiness on feet  Muscle weakness (generalized)  Rationale for Evaluation and Treatment: Rehabilitation  ONSET DATE: 2021 ; PT order 04/04/2024  SUBJECTIVE:   SUBJECTIVE STATEMENT: Pt denies any adverse effects after prior Rx.  Pt states she has a massage today at 10:15.    Pt states she loses her balance bwds and has to catch herself.  Pt reports weakness and has difficulty performing sit to stands.  Pt has difficulty with walking and loses her balance. Pt uses a SPC with walking.  Pt still has some issues with clearing L foot.   PERTINENT HISTORY: Flexor tenotomy on 3 toes of R foot on 05/01/24--Pt reports she has no restrictions from MD. Arthritis,  osteopenia, peripheral neuropathy Bilat TKR  PAIN:  Pt denies pain currently though states she feels her knees early in the AM.   PRECAUTIONS: Fall and Other: peripheral neuropathy   WEIGHT BEARING RESTRICTIONS: No  FALLS:  Has patient fallen in last 6 months? No  LIVING ENVIRONMENT: Lives with: lives alone Lives in:  1 story home Stairs: No Has following equipment at home: Single point cane, shower chair, and Grab bars  OCCUPATION:  Retired  PLOF: Independent  PATIENT GOALS: improved ability to pivot.  Improved walking on uneven terrain and performing stairs   OBJECTIVE:  Note: Objective measures were completed at Evaluation unless otherwise noted.  DIAGNOSTIC FINDINGS: N/A  PATIENT SURVEYS:  ABC scale:  60%  COGNITION: Overall cognitive status: Within functional limits for tasks assessed      LOWER EXTREMITY MMT:  MMT Right eval Left eval  Hip flexion 4/5 4/5  Hip extension    Hip abduction 24.6 19.1  Hip adduction    Hip internal rotation    Hip external rotation    Knee flexion 5/5 seated 5/5 seated  Knee extension 4-/5 4-/5  Ankle dorsiflexion Tol min resistance 4/5  Ankle plantarflexion    Ankle inversion    Ankle eversion     (Blank rows = not tested)    FUNCTIONAL TESTS:  5x STS test:  15.35 without UE support TUG:  17.84 sec with SPC  GAIT: Comments: increased foot slap bilat L > R, decreased foot clearance bilat.  Pt ambulates with SPC                                                                                                                                TREATMENT:    Berg Balance Assessment:  35/56  Sidestepping with occasional UE support at rail x 2 laps Half tandem stance with occasional UE support with CGA/min assist 2x20 sec each  See below for pt education.  PATIENT EDUCATION:  Education details:  Programmer, systems score results.  rationale of interventions, POC, dx, objective findings, relevant anatomy, and what to  expect next treatment.   Person educated: Patient Education method: Explanation, demonstration, verbal cues Education comprehension: verbalized understanding and needs further education, verbal cues required, returned demonstration  HOME EXERCISE PROGRAM: Pt has a HEP.  ASSESSMENT:  CLINICAL IMPRESSION: Treatment was limited today due to pt having a massage appointment.  Pt scored a 35/56 on the Berg Balance Assessment indicating pt at a medium fall risk.  PT educated pt concerning Berg Balance Test score including the rationale of the results.  Her balance has worsened since prior therapy as evidenced by a decreased Berg score of 9 points.  PT performed neuro re-ed activities to improve balance, stability, and functional mobility.  She responded well to Rx having no c/o's after Rx.  Pt will benefit from skilled PT to improve balance, strength, stability, and functional mobility and to decrease fall risk.     OBJECTIVE IMPAIRMENTS: Abnormal gait, decreased activity tolerance, decreased balance, decreased endurance, decreased mobility, difficulty walking, and decreased strength.   ACTIVITY LIMITATIONS: standing, squatting, stairs, transfers, and locomotion level  PARTICIPATION LIMITATIONS: cleaning and community activity  PERSONAL FACTORS: 3+ comorbidities: Arthritis, bilat TKA, peripheral neuropathy are also affecting patient's functional outcome.   REHAB POTENTIAL: Good  CLINICAL DECISION MAKING: Stable/uncomplicated  EVALUATION COMPLEXITY: Low   GOALS:   SHORT TERM GOALS: Target date: 06/05/2024  Pt will be independent and compliant with HEP for improved strength, balance, stability, and mobility.  Baseline: Goal status: INITIAL  2.  Pt will demo improved time on TUG test by at least 5 seconds for improved mobility and reduced fall risk.   Baseline:  Goal status: INITIAL  3.  Pt will report at least a 25% improvement in her balance.   Baseline:  Goal status:  INITIAL    LONG  TERM GOALS: Target date:  06/26/2024   Pt will score at least 44/56 on the Berg Balance Assessment for improved balance and stability with daily tasks and mobility.  Baseline:  35/56 Goal status: INITIAL--Pt completed [employer] today.    2.  Pt will be able to load and unload her dishwasher with good stability and good confidence.  Baseline:  Goal status: INITIAL  3.  Pt will demo improved bilat LE strength to 4+/5 in hip flexion, 5/5 in knee extension, 4-/5 in R ankle DF, and a 5# increase in L hip abduction for improved performance of functional mobility and performance of household chores.  Baseline:  Goal status: INITIAL  4.  Pt will be able to ascend and descend stairs with a reciprocal gait with the rail.   Baseline:  Goal status: INITIAL  5.  Pt will report she is able to perform her normal ambulation with cane with good stability and confidence and without LOB. Baseline:  Goal status: INITIAL   PLAN:  PT FREQUENCY: 2x/week  PT DURATION: 6 weeks  PLANNED INTERVENTIONS: 16109- PT Re-evaluation, 97750- Physical Performance Testing, 97110-Therapeutic exercises, 97530- Therapeutic activity, [id number]- Neuromuscular re-education, 97535- Self Care, 60454- Manual therapy, [phone]- Gait training, [phone]- Aquatic Therapy, [phone]- Electrical stimulation (unattended), Patient/Family education, Balance training, Stair training, Taping, DME instructions, Cryotherapy, and Moist heat  PLAN FOR NEXT SESSION: Berg balance assessment next visit.  Work on [employer].  LE strengthening and balance activities.  Sit to stands.   Trina Fujita III PT, DPT 05/17/24 3:47 PM

## 2024-05-21 ENCOUNTER — Ambulatory Visit (HOSPITAL_BASED_OUTPATIENT_CLINIC_OR_DEPARTMENT_OTHER): Payer: Self-pay | Admitting: Physical Therapy

## 2024-05-21 ENCOUNTER — Encounter (HOSPITAL_BASED_OUTPATIENT_CLINIC_OR_DEPARTMENT_OTHER): Payer: Self-pay | Admitting: Physical Therapy

## 2024-05-21 DIAGNOSIS — R2689 Other abnormalities of gait and mobility: Secondary | ICD-10-CM

## 2024-05-21 DIAGNOSIS — R2681 Unsteadiness on feet: Secondary | ICD-10-CM

## 2024-05-21 DIAGNOSIS — M6281 Muscle weakness (generalized): Secondary | ICD-10-CM

## 2024-05-21 NOTE — Therapy (Signed)
 OUTPATIENT PHYSICAL THERAPY LOWER EXTREMITY EVALUATION   Patient Name: Hailey Perkins MRN: [id number] DOB:1935/05/03, 89 y.o., female Today's Date: 05/22/2024  END OF SESSION:  PT End of Session - 05/21/24 0814     Visit Number 3    Number of Visits 12    Date for PT Re-Evaluation 06/26/24    Authorization Type [employer] MCR    PT Start Time 0808    PT Stop Time 0852    PT Time Calculation (min) 44 min    Activity Tolerance Patient tolerated treatment well    Behavior During Therapy [facility] for tasks assessed/performed          Past Medical History:  Diagnosis Date   Aortic atherosclerosis (HCC)    Arthritis    Cataract 2010   bilateral eyes   Chicken pox    Cholelithiasis    Diverticulitis    Diverticulosis    GERD (gastroesophageal reflux disease)    Glaucoma    Hiatal hernia    History of frequent urinary tract infections    Hyperlipidemia    Osteopenia    Peripheral neuropathy    Sleep apnea    wears a CPAP   Urine incontinence    Past Surgical History:  Procedure Laterality Date   ABDOMINAL HYSTERECTOMY  1977   ANAL FISTULECTOMY  1985   ANKLE FRACTURE SURGERY Right 2014   APPENDECTOMY     BIOPSY  02/15/2021   Procedure: BIOPSY;  Surgeon: Normie Becton., MD;  Location: [facility];  Service: Gastroenterology;;   BREAST BIOPSY Bilateral 10+ yrs ago   BENIGN   BUNIONECTOMY Left 1975   Left Toe   CATARACT EXTRACTION, BILATERAL  2009   COLONOSCOPY WITH PROPOFOL  N/A 02/15/2021   Procedure: COLONOSCOPY WITH PROPOFOL ;  Surgeon: Normie Becton., MD;  Location: [facility];  Service: Gastroenterology;  Laterality: N/A;  ultraslimscope    DILATION AND CURETTAGE OF UTERUS  1972   eye PRK vision correction Left 10/09/2013   eye socket re-formed Left 2014   FOOT NEUROMA SURGERY Right 2003   plus bunionectomy, right great toe   HAMMER TOE SURGERY Right 2005   Right foot correction hammertoe little toe   HEMORRHOID SURGERY  1967   HIP SURGERY Right  2014   right hip femur pin implanted   KNEE ARTHROSCOPY Left 2010   debridement left knee scar tissue   LAPAROSCOPIC OOPHERECTOMY     1986, 1990   LASIK  1998   NEUROMA SURGERY Right 2003   Right Foot Neuroma, plus Bunionectomy, Right Great Toe   POLYPECTOMY  02/15/2021   Procedure: POLYPECTOMY;  Surgeon: Normie Becton., MD;  Location: [facility];  Service: Gastroenterology;;   RECTOCELE REPAIR  1968   REPLACEMENT TOTAL KNEE Left 01/2008   REPLACEMENT TOTAL KNEE Right 08/2008   retinal pucker correction Left 07/14/2012   schwannoma tumor (benign) removal  2011   right rib   SIGMOIDOSCOPY     TONSILLECTOMY AND ADENOIDECTOMY     VENTRAL HERNIA REPAIR  1993   Patient Active Problem List   Diagnosis Date Noted   Arthralgia of right elbow 08/24/2022   Bilateral wrist pain 08/24/2022   Neck pain 08/24/2022   Pain of right hip joint 08/24/2022   Chronic pain of right knee 05/27/2021   OSA on CPAP 05/27/2021   LLQ pain 12/16/2020   History of diverticulitis 12/16/2020   Hematochezia 12/16/2020   Abnormal colonoscopy 12/16/2020   Acquired trigger finger of right index finger 10/26/2020  Other specified arthritis, right hand 10/26/2020   Diverticulitis of colon 08/15/2020   Dry eyes, bilateral 10/10/2019   Sleep-related hypoxia 08/05/2019   OSA (obstructive sleep apnea) 08/05/2019   PLMD (periodic limb movement disorder) 08/05/2019   Cerebellar 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) 05/06/2019   Excessive daytime sleepiness 05/06/2019   Loud snoring 05/06/2019   Ventral hernia without obstruction or gangrene 02/22/2019   Chronic throat clearing 01/18/2019   Nocturnal cough 01/18/2019   Family history of colon cancer in father 01/18/2019   Diverticulosis 01/18/2019   Gait abnormality 12/27/2018   Numbness 12/27/2018   Status post total bilateral knee replacement 11/30/2018   DDD (degenerative disc disease), cervical 11/30/2018   Status post carpal tunnel  release 11/30/2018   DDD (degenerative disc disease), lumbar 10/26/2018   Primary osteoarthritis of both hands 10/26/2018   Primary osteoarthritis of both feet 10/26/2018   Gastroesophageal reflux disease 08/27/2018   Bilateral impacted cerumen 08/21/2018   Sensorineural hearing loss (SNHL) of both ears 08/21/2018   Tinnitus, bilateral 08/21/2018   Blood in urine 04/17/2018   Secondary open-angle glaucoma of left eye, moderate stage 03/10/2018   Early stage 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 10/05/2017   Epiretinal membrane (ERM) of left eye 10/05/2017   Pseudophakia of both eyes 10/05/2017   Osteopenia 03/19/2017   Mixed hyperlipidemia 08/18/2016   Generalized osteoarthritis of multiple sites 08/18/2016   Peripheral neuropathic pain 08/18/2016   Overactive bladder 08/18/2016   Glaucoma 08/18/2016   Cervical disc disorder with radiculopathy of cervical region 08/17/2016   Carpal tunnel syndrome, right upper limb 08/17/2016   Abnormal finding on mammography 06/14/2016   Breast lump 05/06/2015    PCP: Bertha Broad, MD  REFERRING PROVIDER: Bertha Broad, MD  REFERRING DIAG: R26.81 (ICD-10-CM) - Unsteadiness on feet/Unsteady gait  THERAPY DIAG:  Other abnormalities of gait and mobility  Unsteadiness on feet  Muscle weakness (generalized)  Rationale for Evaluation and Treatment: Rehabilitation  ONSET DATE: 2021 ; PT order 04/04/2024  SUBJECTIVE:   SUBJECTIVE STATEMENT: Pt denies any adverse effects after prior Rx.  Pt states her L hip was bothering her.  Pt has weakness in R knee.  She has to think about bending R knee with walking.  Pt states she has not been performing her HEP though has her handouts.   Pt states she loses her balance bwds and has to catch herself.  Pt reports weakness and has difficulty performing sit to stands.  Pt has difficulty with walking and loses her balance. Pt uses a SPC with walking.  Pt still has some issues  with clearing L foot.   PERTINENT HISTORY: Flexor tenotomy on 3 toes of R foot on 05/01/24--Pt reports she has no restrictions from MD. Arthritis, osteopenia, peripheral neuropathy Bilat TKR  PAIN:  Pt denies pain currently though states she feels her knees early in the AM.   PRECAUTIONS: Fall and Other: peripheral neuropathy   WEIGHT BEARING RESTRICTIONS: No  FALLS:  Has patient fallen in last 6 months? No  LIVING ENVIRONMENT: Lives with: lives alone Lives in:  1 story home Stairs: No Has following equipment at home: Single point cane, shower chair, and Grab bars  OCCUPATION:  Retired  PLOF: Independent  PATIENT GOALS: improved ability to pivot.  Improved walking on uneven terrain and performing stairs   OBJECTIVE:  Note: Objective measures were completed at Evaluation unless otherwise noted.  DIAGNOSTIC FINDINGS: N/A  PATIENT SURVEYS:  ABC scale:  60%  COGNITION: Overall cognitive status: Within functional limits for tasks assessed      LOWER EXTREMITY MMT:  MMT Right eval Left eval  Hip flexion 4/5 4/5  Hip extension    Hip abduction 24.6 19.1  Hip adduction    Hip internal rotation    Hip external rotation    Knee flexion 5/5 seated 5/5 seated  Knee extension 4-/5 4-/5  Ankle dorsiflexion Tol min resistance 4/5  Ankle plantarflexion    Ankle inversion    Ankle eversion     (Blank rows = not tested)    FUNCTIONAL TESTS:  5x STS test:  15.35 without UE support TUG:  17.84 sec with SPC  GAIT: Comments: increased foot slap bilat L > R, decreased foot clearance bilat.  Pt ambulates with SPC                                                                                                                                TREATMENT:    Reviewed HEP.  Nustep L4 UE/LEs x 5 mins Seated marching 2x10 Seated DF 2x10     -PT instructed pt to stop if she has any foot/toe pain. Sit to stands from chair x8 reps and x 8 reps with RTB above knees without  UE support Sidestepping with UE support at counter x 3 laps Half tandem stance with occasional UE support with SBA 2x20 sec each  Pt received a new HEP handout and was educated in correct form and appropriate frequency.  PT instructed pt to not perform seated ankle DF AROM if she has foot/toe pain.  See below for pt education.  PATIENT EDUCATION:  Education details:  HEP, rationale of interventions, POC, dx, relevant anatomy, and what to expect next treatment.  PT answered pt's questions.   Person educated: Patient Education method: Explanation, demonstration, verbal cues Education comprehension: verbalized understanding and needs further education, verbal cues required, returned demonstration  HOME EXERCISE PROGRAM: Access Code: [email or web address] URL: [email or web address] Date: 05/21/2024 Prepared by: Marnie Siren  Exercises - Seated March  - 1 x daily - 7 x weekly - 2 sets - 10 reps - Seated Ankle Dorsiflexion AROM  - 1 x daily - 7 x weekly - 2 sets - 10 reps - Sit to Stand  - 1 x daily - 5-6 x weekly - 2 sets - 5-8 reps - Side Stepping with Counter Support  - 1 x daily - 7 x weekly - 2 sets - 5-10 reps  ASSESSMENT:  CLINICAL IMPRESSION: PT reviewed prior HEP and instructed pt in what exercises to return to at this time.  PT had pt perform select exercises from prior HEP and instructed pt in correct form.  Pt performed exercises well and had good tolerance with exercises.  Pt had no foot pain with seated DF ankle ROM.  PT gave pt a new HEP handout and pt demonstrates good understanding.  She responded well to Rx having no  c/o's after Rx.  Pt will benefit from skilled PT to improve balance, strength, stability, and functional mobility and to decrease fall risk.     OBJECTIVE IMPAIRMENTS: Abnormal gait, decreased activity tolerance, decreased balance, decreased endurance, decreased mobility, difficulty walking, and decreased strength.   ACTIVITY LIMITATIONS: standing,  squatting, stairs, transfers, and locomotion level  PARTICIPATION LIMITATIONS: cleaning and community activity  PERSONAL FACTORS: 3+ comorbidities: Arthritis, bilat TKA, peripheral neuropathy are also affecting patient's functional outcome.   REHAB POTENTIAL: Good  CLINICAL DECISION MAKING: Stable/uncomplicated  EVALUATION COMPLEXITY: Low   GOALS:   SHORT TERM GOALS: Target date: 06/05/2024  Pt will be independent and compliant with HEP for improved strength, balance, stability, and mobility.  Baseline: Goal status: INITIAL  2.  Pt will demo improved time on TUG test by at least 5 seconds for improved mobility and reduced fall risk.   Baseline:  Goal status: INITIAL  3.  Pt will report at least a 25% improvement in her balance.   Baseline:  Goal status: INITIAL    LONG TERM GOALS: Target date:  06/26/2024   Pt will score at least 44/56 on the Berg Balance Assessment for improved balance and stability with daily tasks and mobility.  Baseline:  35/56 Goal status: INITIAL--Pt completed [employer] today.    2.  Pt will be able to load and unload her dishwasher with good stability and good confidence.  Baseline:  Goal status: INITIAL  3.  Pt will demo improved bilat LE strength to 4+/5 in hip flexion, 5/5 in knee extension, 4-/5 in R ankle DF, and a 5# increase in L hip abduction for improved performance of functional mobility and performance of household chores.  Baseline:  Goal status: INITIAL  4.  Pt will be able to ascend and descend stairs with a reciprocal gait with the rail.   Baseline:  Goal status: INITIAL  5.  Pt will report she is able to perform her normal ambulation with cane with good stability and confidence and without LOB. Baseline:  Goal status: INITIAL   PLAN:  PT FREQUENCY: 2x/week  PT DURATION: 6 weeks  PLANNED INTERVENTIONS: [id number]- PT Re-evaluation, 97750- Physical Performance Testing, 97110-Therapeutic exercises, 97530- Therapeutic  activity, [id number]- Neuromuscular re-education, 97535- Self Care, 62130- Manual therapy, [phone]- Gait training, [phone]- Aquatic Therapy, [phone]- Electrical stimulation (unattended), Patient/Family education, Balance training, Stair training, Taping, DME instructions, Cryotherapy, and Moist heat  PLAN FOR NEXT SESSION: Work on [employer].  LE strengthening and balance activities.  Sit to stands.   Trina Fujita III PT, DPT 05/22/24 9:25 AM

## 2024-05-31 ENCOUNTER — Telehealth: Payer: Self-pay | Admitting: Family Medicine

## 2024-05-31 DIAGNOSIS — G4733 Obstructive sleep apnea (adult) (pediatric): Secondary | ICD-10-CM

## 2024-05-31 NOTE — Telephone Encounter (Signed)
 Pt states her cpap air pressure is blowing too hard. DME informed her to contact doctor, please call.

## 2024-05-31 NOTE — Telephone Encounter (Signed)
 Dr. Vear- can you review? Amy and Dr. Chalice out next week. You are on call. Any recommendtions?   Last seen 08/29/23 by AL,NP. Next appt: 08/28/24.  DME: [facility].  Primary MD: Dr. Chalice.

## 2024-05-31 NOTE — Telephone Encounter (Signed)
 Called pt back at [phone]. Relayed Dr. Duncan recommendation. She is agreeable.  Order placed, community message sent to [facility] that order placed.  If ineffective, she will contact DME back to have machine looked at.  Set up with current machine 08/22/2019. Aware she will be eligible for new machine after this date. Will likely need updated sleep study as well before new machine since last SS 2020.

## 2024-06-01 ENCOUNTER — Encounter: Payer: Self-pay | Admitting: Family Medicine

## 2024-06-06 ENCOUNTER — Ambulatory Visit (HOSPITAL_BASED_OUTPATIENT_CLINIC_OR_DEPARTMENT_OTHER): Payer: Self-pay | Attending: Internal Medicine | Admitting: Physical Therapy

## 2024-06-06 ENCOUNTER — Encounter (HOSPITAL_BASED_OUTPATIENT_CLINIC_OR_DEPARTMENT_OTHER): Payer: Self-pay | Admitting: Physical Therapy

## 2024-06-06 DIAGNOSIS — M6281 Muscle weakness (generalized): Secondary | ICD-10-CM | POA: Insufficient documentation

## 2024-06-06 DIAGNOSIS — R2689 Other abnormalities of gait and mobility: Secondary | ICD-10-CM | POA: Diagnosis present

## 2024-06-06 DIAGNOSIS — R2681 Unsteadiness on feet: Secondary | ICD-10-CM | POA: Diagnosis present

## 2024-06-06 NOTE — Therapy (Signed)
 OUTPATIENT PHYSICAL THERAPY LOWER EXTREMITY TREATMENT   Patient Name: Hailey Perkins MRN: [id number] DOB:01-May-1935, 89 y.o., female Today's Date: 06/07/2024  END OF SESSION:  PT End of Session - 06/06/24 1559     Visit Number 4    Number of Visits 12    Date for PT Re-Evaluation 06/26/24    Authorization Type [employer] MCR    PT Start Time 1550    PT Stop Time 1635    PT Time Calculation (min) 45 min    Activity Tolerance Patient tolerated treatment well    Behavior During Therapy WFL for tasks assessed/performed          Past Medical History:  Diagnosis Date   Aortic atherosclerosis (HCC)    Arthritis    Cataract 2010   bilateral eyes   Chicken pox    Cholelithiasis    Diverticulitis    Diverticulosis    GERD (gastroesophageal reflux disease)    Glaucoma    Hiatal hernia    History of frequent urinary tract infections    Hyperlipidemia    Osteopenia    Peripheral neuropathy    Sleep apnea    wears a CPAP   Urine incontinence    Past Surgical History:  Procedure Laterality Date   ABDOMINAL HYSTERECTOMY  1977   ANAL FISTULECTOMY  1985   ANKLE FRACTURE SURGERY Right 2014   APPENDECTOMY     BIOPSY  02/15/2021   Procedure: BIOPSY;  Surgeon: Wilhelmenia Aloha Raddle., MD;  Location: [facility];  Service: Gastroenterology;;   BREAST BIOPSY Bilateral 10+ yrs ago   BENIGN   BUNIONECTOMY Left 1975   Left Toe   CATARACT EXTRACTION, BILATERAL  2009   COLONOSCOPY WITH PROPOFOL  N/A 02/15/2021   Procedure: COLONOSCOPY WITH PROPOFOL ;  Surgeon: Wilhelmenia Aloha Raddle., MD;  Location: [facility];  Service: Gastroenterology;  Laterality: N/A;  ultraslimscope    DILATION AND CURETTAGE OF UTERUS  1972   eye PRK vision correction Left 10/09/2013   eye socket re-formed Left 2014   FOOT NEUROMA SURGERY Right 2003   plus bunionectomy, right great toe   HAMMER TOE SURGERY Right 2005   Right foot correction hammertoe little toe   HEMORRHOID SURGERY  1967   HIP SURGERY Right  2014   right hip femur pin implanted   KNEE ARTHROSCOPY Left 2010   debridement left knee scar tissue   LAPAROSCOPIC OOPHERECTOMY     1986, 1990   LASIK  1998   NEUROMA SURGERY Right 2003   Right Foot Neuroma, plus Bunionectomy, Right Great Toe   POLYPECTOMY  02/15/2021   Procedure: POLYPECTOMY;  Surgeon: Wilhelmenia Aloha Raddle., MD;  Location: [facility];  Service: Gastroenterology;;   RECTOCELE REPAIR  1968   REPLACEMENT TOTAL KNEE Left 01/2008   REPLACEMENT TOTAL KNEE Right 08/2008   retinal pucker correction Left 07/14/2012   schwannoma tumor (benign) removal  2011   right rib   SIGMOIDOSCOPY     TONSILLECTOMY AND ADENOIDECTOMY     VENTRAL HERNIA REPAIR  1993   Patient Active Problem List   Diagnosis Date Noted   Arthralgia of right elbow 08/24/2022   Bilateral wrist pain 08/24/2022   Neck pain 08/24/2022   Pain of right hip joint 08/24/2022   Chronic pain of right knee 05/27/2021   OSA on CPAP 05/27/2021   LLQ pain 12/16/2020   History of diverticulitis 12/16/2020   Hematochezia 12/16/2020   Abnormal colonoscopy 12/16/2020   Acquired trigger finger of right index finger 10/26/2020  Other specified arthritis, right hand 10/26/2020   Diverticulitis of colon 08/15/2020   Dry eyes, bilateral 10/10/2019   Sleep-related hypoxia 08/05/2019   OSA (obstructive sleep apnea) 08/05/2019   PLMD (periodic limb movement disorder) 08/05/2019   Cerebellar 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) 05/06/2019   Excessive daytime sleepiness 05/06/2019   Loud snoring 05/06/2019   Ventral hernia without obstruction or gangrene 02/22/2019   Chronic throat clearing 01/18/2019   Nocturnal cough 01/18/2019   Family history of colon cancer in father 01/18/2019   Diverticulosis 01/18/2019   Gait abnormality 12/27/2018   Numbness 12/27/2018   Status post total bilateral knee replacement 11/30/2018   DDD (degenerative disc disease), cervical 11/30/2018   Status post carpal tunnel  release 11/30/2018   DDD (degenerative disc disease), lumbar 10/26/2018   Primary osteoarthritis of both hands 10/26/2018   Primary osteoarthritis of both feet 10/26/2018   Gastroesophageal reflux disease 08/27/2018   Bilateral impacted cerumen 08/21/2018   Sensorineural hearing loss (SNHL) of both ears 08/21/2018   Tinnitus, bilateral 08/21/2018   Blood in urine 04/17/2018   Secondary open-angle glaucoma of left eye, moderate stage 03/10/2018   Early stage 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 10/05/2017   Epiretinal membrane (ERM) of left eye 10/05/2017   Pseudophakia of both eyes 10/05/2017   Osteopenia 03/19/2017   Mixed hyperlipidemia 08/18/2016   Generalized osteoarthritis of multiple sites 08/18/2016   Peripheral neuropathic pain 08/18/2016   Overactive bladder 08/18/2016   Glaucoma 08/18/2016   Cervical disc disorder with radiculopathy of cervical region 08/17/2016   Carpal tunnel syndrome, right upper limb 08/17/2016   Abnormal finding on mammography 06/14/2016   Breast lump 05/06/2015    PCP: Yolande Toribio MATSU, MD  REFERRING PROVIDER: Yolande Toribio MATSU, MD  REFERRING DIAG: R26.81 (ICD-10-CM) - Unsteadiness on feet/Unsteady gait  THERAPY DIAG:  Other abnormalities of gait and mobility  Unsteadiness on feet  Muscle weakness (generalized)  Rationale for Evaluation and Treatment: Rehabilitation  ONSET DATE: 2021 ; PT order 04/04/2024  SUBJECTIVE:   SUBJECTIVE STATEMENT: Pt has been coming to the gym and using the Nustep, TM, and recumbent bike.  Pt reports compliance with HEP.  Pt denies any adverse effects after prior Rx.   Pt states she loses her balance bwds and has to catch herself.  Pt uses a SPC with walking.    PERTINENT HISTORY: Flexor tenotomy on 3 toes of R foot on 05/01/24--Pt reports she has no restrictions from MD. Arthritis, osteopenia, peripheral neuropathy Bilat TKR  PAIN:  Pt denies pain currently though did notice some  pain in medial proximal shin earlier today with walking.  She thinks it's from the compression socks.    PRECAUTIONS: Fall and Other: peripheral neuropathy   WEIGHT BEARING RESTRICTIONS: No  FALLS:  Has patient fallen in last 6 months? No  LIVING ENVIRONMENT: Lives with: lives alone Lives in:  1 story home Stairs: No Has following equipment at home: Single point cane, shower chair, and Grab bars  OCCUPATION:  Retired  PLOF: Independent  PATIENT GOALS: improved ability to pivot.  Improved walking on uneven terrain and performing stairs   OBJECTIVE:  Note: Objective measures were completed at Evaluation unless otherwise noted.  DIAGNOSTIC FINDINGS: N/A  PATIENT SURVEYS:  ABC scale:  60%  COGNITION: Overall cognitive status: Within functional limits for tasks assessed      LOWER EXTREMITY MMT:  MMT Right eval Left eval  Hip flexion 4/5 4/5  Hip extension    Hip  abduction 24.6 19.1  Hip adduction    Hip internal rotation    Hip external rotation    Knee flexion 5/5 seated 5/5 seated  Knee extension 4-/5 4-/5  Ankle dorsiflexion Tol min resistance 4/5  Ankle plantarflexion    Ankle inversion    Ankle eversion     (Blank rows = not tested)    FUNCTIONAL TESTS:  5x STS test:  15.35 without UE support TUG:  17.84 sec with SPC  GAIT: Comments: increased foot slap bilat L > R, decreased foot clearance bilat.  Pt ambulates with SPC                                                                                                                                TREATMENT:    Nustep L4 UE/LEs x 6 mins Sit to stands from chair 2x10 with RTB above knees without UE support Seated DF 3x10 Seated clams with RTB 2x10 Sidestepping with UE support at rail x 3 laps Alt toe tapping on 6 inch step with UE support 2x10 Half tandem stance with occasional UE support with SBA 2x20 sec each Standing on airex with NBOS 3x30 sec F/B swaying x 2 sets and S/S swaying x 1 set  with min assist and UE support as needed  Pt ambulated around cones with SPC and SBA.   PATIENT EDUCATION:  Education details:  HEP, rationale of interventions, POC, dx, relevant anatomy, and what to expect next treatment.  PT answered pt's questions.   Person educated: Patient Education method: Explanation, demonstration, verbal cues Education comprehension: verbalized understanding and needs further education, verbal cues required, returned demonstration  HOME EXERCISE PROGRAM: Access Code: [email or web address] URL: [email or web address] Date: 05/21/2024 Prepared by: Mose Minerva  Exercises - Seated March  - 1 x daily - 7 x weekly - 2 sets - 10 reps - Seated Ankle Dorsiflexion AROM  - 1 x daily - 7 x weekly - 2 sets - 10 reps - Sit to Stand  - 1 x daily - 5-6 x weekly - 2 sets - 5-8 reps - Side Stepping with Counter Support  - 1 x daily - 7 x weekly - 2 sets - 5-10 reps  ASSESSMENT:  CLINICAL IMPRESSION: PT continues to have increased foot slap with ambulation.  Pt has times when she is a little unsteady with ambulation and requires her AD with ambulation.  PT states she loses her balance bwds.  PT worked on swaying on airex pad to improve corrective strategies including to improve posterior LOB.  She requires UE support with balance activities.  Pt performed sit to stand transfers from the chair.  She occasionally had multiple attempts to perform a repetition.  She gave great effort with all exercises and activities.  Pt responded well to Rx having no c/o's after Rx.  Pt will benefit from continued skilled PT to improve balance, strength, stability, and functional mobility and to decrease fall risk.  OBJECTIVE IMPAIRMENTS: Abnormal gait, decreased activity tolerance, decreased balance, decreased endurance, decreased mobility, difficulty walking, and decreased strength.   ACTIVITY LIMITATIONS: standing, squatting, stairs, transfers, and locomotion level  PARTICIPATION  LIMITATIONS: cleaning and community activity  PERSONAL FACTORS: 3+ comorbidities: Arthritis, bilat TKA, peripheral neuropathy are also affecting patient's functional outcome.   REHAB POTENTIAL: Good  CLINICAL DECISION MAKING: Stable/uncomplicated  EVALUATION COMPLEXITY: Low   GOALS:   SHORT TERM GOALS: Target date: 06/05/2024  Pt will be independent and compliant with HEP for improved strength, balance, stability, and mobility.  Baseline: Goal status: INITIAL  2.  Pt will demo improved time on TUG test by at least 5 seconds for improved mobility and reduced fall risk.   Baseline:  Goal status: INITIAL  3.  Pt will report at least a 25% improvement in her balance.   Baseline:  Goal status: INITIAL    LONG TERM GOALS: Target date:  06/26/2024   Pt will score at least 44/56 on the Berg Balance Assessment for improved balance and stability with daily tasks and mobility.  Baseline:  35/56 Goal status: INITIAL--Pt completed [employer] today.    2.  Pt will be able to load and unload her dishwasher with good stability and good confidence.  Baseline:  Goal status: INITIAL  3.  Pt will demo improved bilat LE strength to 4+/5 in hip flexion, 5/5 in knee extension, 4-/5 in R ankle DF, and a 5# increase in L hip abduction for improved performance of functional mobility and performance of household chores.  Baseline:  Goal status: INITIAL  4.  Pt will be able to ascend and descend stairs with a reciprocal gait with the rail.   Baseline:  Goal status: INITIAL  5.  Pt will report she is able to perform her normal ambulation with cane with good stability and confidence and without LOB. Baseline:  Goal status: INITIAL   PLAN:  PT FREQUENCY: 2x/week  PT DURATION: 6 weeks  PLANNED INTERVENTIONS: 02835- PT Re-evaluation, 97750- Physical Performance Testing, 97110-Therapeutic exercises, 97530- Therapeutic activity, [id number]- Neuromuscular re-education, 97535- Self Care, 02859-  Manual therapy, [phone]- Gait training, [phone]- Aquatic Therapy, [phone]- Electrical stimulation (unattended), Patient/Family education, Balance training, Stair training, Taping, DME instructions, Cryotherapy, and Moist heat  PLAN FOR NEXT SESSION: Work on [employer].  LE strengthening and balance activities.  Sit to stands.   Leigh Minerva III PT, DPT 06/07/24 6:33 AM

## 2024-06-10 ENCOUNTER — Encounter: Payer: Self-pay | Admitting: Family Medicine

## 2024-06-11 NOTE — Telephone Encounter (Signed)
 Sent urgent community message to [employer] asking they contact pt to discuss things. Waiting on response.

## 2024-06-12 ENCOUNTER — Ambulatory Visit (HOSPITAL_BASED_OUTPATIENT_CLINIC_OR_DEPARTMENT_OTHER): Admitting: Physical Therapy

## 2024-06-12 ENCOUNTER — Encounter (HOSPITAL_BASED_OUTPATIENT_CLINIC_OR_DEPARTMENT_OTHER): Payer: Self-pay | Admitting: Physical Therapy

## 2024-06-12 DIAGNOSIS — R2681 Unsteadiness on feet: Secondary | ICD-10-CM

## 2024-06-12 DIAGNOSIS — M6281 Muscle weakness (generalized): Secondary | ICD-10-CM

## 2024-06-12 DIAGNOSIS — R2689 Other abnormalities of gait and mobility: Secondary | ICD-10-CM | POA: Diagnosis not present

## 2024-06-12 NOTE — Therapy (Signed)
 OUTPATIENT PHYSICAL THERAPY LOWER EXTREMITY TREATMENT   Patient Name: Hailey Perkins MRN: [id number] DOB:10-Jan-1935, 89 y.o., female Today's Date: 06/13/2024  END OF SESSION:  PT End of Session - 06/12/24 1034     Visit Number 5    Number of Visits 12    Date for PT Re-Evaluation 06/26/24    Authorization Type BCBS MCR    PT Start Time 1027    PT Stop Time 1107    PT Time Calculation (min) 40 min    Activity Tolerance Patient tolerated treatment well    Behavior During Therapy WFL for tasks assessed/performed          Past Medical History:  Diagnosis Date   Aortic atherosclerosis (HCC)    Arthritis    Cataract 2010   bilateral eyes   Chicken pox    Cholelithiasis    Diverticulitis    Diverticulosis    GERD (gastroesophageal reflux disease)    Glaucoma    Hiatal hernia    History of frequent urinary tract infections    Hyperlipidemia    Osteopenia    Peripheral neuropathy    Sleep apnea    wears a CPAP   Urine incontinence    Past Surgical History:  Procedure Laterality Date   ABDOMINAL HYSTERECTOMY  1977   ANAL FISTULECTOMY  1985   ANKLE FRACTURE SURGERY Right 2014   APPENDECTOMY     BIOPSY  02/15/2021   Procedure: BIOPSY;  Surgeon: Wilhelmenia Aloha Raddle., MD;  Location: [facility];  Service: Gastroenterology;;   BREAST BIOPSY Bilateral 10+ yrs ago   BENIGN   BUNIONECTOMY Left 1975   Left Toe   CATARACT EXTRACTION, BILATERAL  2009   COLONOSCOPY WITH PROPOFOL  N/A 02/15/2021   Procedure: COLONOSCOPY WITH PROPOFOL ;  Surgeon: Wilhelmenia Aloha Raddle., MD;  Location: [facility];  Service: Gastroenterology;  Laterality: N/A;  ultraslimscope    DILATION AND CURETTAGE OF UTERUS  1972   eye PRK vision correction Left 10/09/2013   eye socket re-formed Left 2014   FOOT NEUROMA SURGERY Right 2003   plus bunionectomy, right great toe   HAMMER TOE SURGERY Right 2005   Right foot correction hammertoe little toe   HEMORRHOID SURGERY  1967   HIP SURGERY Right  2014   right hip femur pin implanted   KNEE ARTHROSCOPY Left 2010   debridement left knee scar tissue   LAPAROSCOPIC OOPHERECTOMY     1986, 1990   LASIK  1998   NEUROMA SURGERY Right 2003   Right Foot Neuroma, plus Bunionectomy, Right Great Toe   POLYPECTOMY  02/15/2021   Procedure: POLYPECTOMY;  Surgeon: Wilhelmenia Aloha Raddle., MD;  Location: [facility];  Service: Gastroenterology;;   RECTOCELE REPAIR  1968   REPLACEMENT TOTAL KNEE Left 01/2008   REPLACEMENT TOTAL KNEE Right 08/2008   retinal pucker correction Left 07/14/2012   schwannoma tumor (benign) removal  2011   right rib   SIGMOIDOSCOPY     TONSILLECTOMY AND ADENOIDECTOMY     VENTRAL HERNIA REPAIR  1993   Patient Active Problem List   Diagnosis Date Noted   Arthralgia of right elbow 08/24/2022   Bilateral wrist pain 08/24/2022   Neck pain 08/24/2022   Pain of right hip joint 08/24/2022   Chronic pain of right knee 05/27/2021   OSA on CPAP 05/27/2021   LLQ pain 12/16/2020   History of diverticulitis 12/16/2020   Hematochezia 12/16/2020   Abnormal colonoscopy 12/16/2020   Acquired trigger finger of right index finger 10/26/2020  Other specified arthritis, right hand 10/26/2020   Diverticulitis of colon 08/15/2020   Dry eyes, bilateral 10/10/2019   Sleep-related hypoxia 08/05/2019   OSA (obstructive sleep apnea) 08/05/2019   PLMD (periodic limb movement disorder) 08/05/2019   Cerebellar 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) 05/06/2019   Excessive daytime sleepiness 05/06/2019   Loud snoring 05/06/2019   Ventral hernia without obstruction or gangrene 02/22/2019   Chronic throat clearing 01/18/2019   Nocturnal cough 01/18/2019   Family history of colon cancer in father 01/18/2019   Diverticulosis 01/18/2019   Gait abnormality 12/27/2018   Numbness 12/27/2018   Status post total bilateral knee replacement 11/30/2018   DDD (degenerative disc disease), cervical 11/30/2018   Status post carpal tunnel  release 11/30/2018   DDD (degenerative disc disease), lumbar 10/26/2018   Primary osteoarthritis of both hands 10/26/2018   Primary osteoarthritis of both feet 10/26/2018   Gastroesophageal reflux disease 08/27/2018   Bilateral impacted cerumen 08/21/2018   Sensorineural hearing loss (SNHL) of both ears 08/21/2018   Tinnitus, bilateral 08/21/2018   Blood in urine 04/17/2018   Secondary open-angle glaucoma of left eye, moderate stage 03/10/2018   Early stage 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 10/05/2017   Epiretinal membrane (ERM) of left eye 10/05/2017   Pseudophakia of both eyes 10/05/2017   Osteopenia 03/19/2017   Mixed hyperlipidemia 08/18/2016   Generalized osteoarthritis of multiple sites 08/18/2016   Peripheral neuropathic pain 08/18/2016   Overactive bladder 08/18/2016   Glaucoma 08/18/2016   Cervical disc disorder with radiculopathy of cervical region 08/17/2016   Carpal tunnel syndrome, right upper limb 08/17/2016   Abnormal finding on mammography 06/14/2016   Breast lump 05/06/2015    PCP: Yolande Toribio MATSU, MD  REFERRING PROVIDER: Yolande Toribio MATSU, MD  REFERRING DIAG: R26.81 (ICD-10-CM) - Unsteadiness on feet/Unsteady gait  THERAPY DIAG:  Other abnormalities of gait and mobility  Unsteadiness on feet  Muscle weakness (generalized)  Rationale for Evaluation and Treatment: Rehabilitation  ONSET DATE: 2021 ; PT order 04/04/2024  SUBJECTIVE:   SUBJECTIVE STATEMENT: Pt has been coming to the gym and using the Nustep, TM, and recumbent bike.  Pt reports compliance with HEP.  Pt denies any adverse effects after prior Rx.   Pt uses a SPC with walking.    PERTINENT HISTORY: Flexor tenotomy on 3 toes of R foot on 05/01/24--Pt reports she has no restrictions from MD. Arthritis, osteopenia, peripheral neuropathy Bilat TKR  PAIN:  Pt denies pain currently  PRECAUTIONS: Fall and Other: peripheral neuropathy   WEIGHT BEARING  RESTRICTIONS: No  FALLS:  Has patient fallen in last 6 months? No  LIVING ENVIRONMENT: Lives with: lives alone Lives in:  1 story home Stairs: No Has following equipment at home: Single point cane, shower chair, and Grab bars  OCCUPATION:  Retired  PLOF: Independent  PATIENT GOALS: improved ability to pivot.  Improved walking on uneven terrain and performing stairs   OBJECTIVE:  Note: Objective measures were completed at Evaluation unless otherwise noted.  DIAGNOSTIC FINDINGS: N/A  PATIENT SURVEYS:  ABC scale:  60%  COGNITION: Overall cognitive status: Within functional limits for tasks assessed      LOWER EXTREMITY MMT:  MMT Right eval Left eval  Hip flexion 4/5 4/5  Hip extension    Hip abduction 24.6 19.1  Hip adduction    Hip internal rotation    Hip external rotation    Knee flexion 5/5 seated 5/5 seated  Knee extension 4-/5 4-/5  Ankle dorsiflexion Tol  min resistance 4/5  Ankle plantarflexion    Ankle inversion    Ankle eversion     (Blank rows = not tested)    FUNCTIONAL TESTS:  5x STS test:  15.35 without UE support TUG:  17.84 sec with SPC  GAIT: Comments: increased foot slap bilat L > R, decreased foot clearance bilat.  Pt ambulates with SPC                                                                                                                                TREATMENT DATE:    06/12/2024:  Therapeutic Exercise: Nustep L4 UE/LEs x 6 mins Sit to stands from chair 2x10 with GTB above knees without UE support Seated DF 2x10   Neuro Re-ed Activities: F/B swaying and S/S swaying x 2 sets of 10 reps on floor and x 1 set of 10 reps each with min assist and UE support as needed  Marching on airex 2x10 with UE support Standing on airex with NBOS 3x30 sec Half tandem stance with occasional UE support with SBA 2x20-30 sec each Tandem stance with UE support x20 sec bilat Sidestepping without UE support with SBA x 2 laps at rail Pt  ambulated over 2 hurdles with UE support on rail with a step to gait pattern and with a reciprocal gait pattern with SBA/CGA  PATIENT EDUCATION:  Education details:  HEP, rationale of interventions, POC, dx, exercise form, and relevant anatomy.  PT answered pt's questions.   Person educated: Patient Education method: Explanation, demonstration, verbal cues Education comprehension: verbalized understanding and needs further education, verbal cues required, returned demonstration  HOME EXERCISE PROGRAM: Access Code: [email or web address] URL: [email or web address] Date: 05/21/2024 Prepared by: Mose Minerva  Exercises - Seated March  - 1 x daily - 7 x weekly - 2 sets - 10 reps - Seated Ankle Dorsiflexion AROM  - 1 x daily - 7 x weekly - 2 sets - 10 reps - Sit to Stand  - 1 x daily - 5-6 x weekly - 2 sets - 5-8 reps - Side Stepping with Counter Support  - 1 x daily - 7 x weekly - 2 sets - 5-10 reps  ASSESSMENT:  CLINICAL IMPRESSION: Pt performed therapeutic exercise and neuro re-ed activities well.  She gives great effort with all exercises.  PT worked on swaying on floor and airex pad to improve corrective strategies including to improve posterior LOB.  Pt has increased LOB with backward sway.  Pt has increased difficulty with tandem balance and required UE support.  PT had pt perform stepping over 2 hurdles to improve foot clearance with gait.  She required UE support on rail and SBA/CGA.  Pt is improving with sit to stand transfers and does not require UE support from the chair.  Pt responded well to Rx having no c/o's after Rx.  Pt will benefit from continued skilled PT to improve balance, strength, stability, and functional mobility and  to decrease fall risk.     OBJECTIVE IMPAIRMENTS: Abnormal gait, decreased activity tolerance, decreased balance, decreased endurance, decreased mobility, difficulty walking, and decreased strength.   ACTIVITY LIMITATIONS: standing, squatting, stairs,  transfers, and locomotion level  PARTICIPATION LIMITATIONS: cleaning and community activity  PERSONAL FACTORS: 3+ comorbidities: Arthritis, bilat TKA, peripheral neuropathy are also affecting patient's functional outcome.   REHAB POTENTIAL: Good  CLINICAL DECISION MAKING: Stable/uncomplicated  EVALUATION COMPLEXITY: Low   GOALS:   SHORT TERM GOALS: Target date: 06/05/2024  Pt will be independent and compliant with HEP for improved strength, balance, stability, and mobility.  Baseline: Goal status: INITIAL  2.  Pt will demo improved time on TUG test by at least 5 seconds for improved mobility and reduced fall risk.   Baseline:  Goal status: INITIAL  3.  Pt will report at least a 25% improvement in her balance.   Baseline:  Goal status: INITIAL    LONG TERM GOALS: Target date:  06/26/2024   Pt will score at least 44/56 on the Berg Balance Assessment for improved balance and stability with daily tasks and mobility.  Baseline:  35/56 Goal status: INITIAL--Pt completed [employer] today.    2.  Pt will be able to load and unload her dishwasher with good stability and good confidence.  Baseline:  Goal status: INITIAL  3.  Pt will demo improved bilat LE strength to 4+/5 in hip flexion, 5/5 in knee extension, 4-/5 in R ankle DF, and a 5# increase in L hip abduction for improved performance of functional mobility and performance of household chores.  Baseline:  Goal status: INITIAL  4.  Pt will be able to ascend and descend stairs with a reciprocal gait with the rail.   Baseline:  Goal status: INITIAL  5.  Pt will report she is able to perform her normal ambulation with cane with good stability and confidence and without LOB. Baseline:  Goal status: INITIAL   PLAN:  PT FREQUENCY: 2x/week  PT DURATION: 6 weeks  PLANNED INTERVENTIONS: 02835- PT Re-evaluation, 97750- Physical Performance Testing, 97110-Therapeutic exercises, 97530- Therapeutic activity, [id number]-  Neuromuscular re-education, 97535- Self Care, 02859- Manual therapy, [phone]- Gait training, [phone]- Aquatic Therapy, [phone]- Electrical stimulation (unattended), Patient/Family education, Balance training, Stair training, Taping, DME instructions, Cryotherapy, and Moist heat  PLAN FOR NEXT SESSION: Work on [employer].  LE strengthening and balance activities.  Sit to stands.   Leigh Minerva III PT, DPT 06/14/24 6:05 AM

## 2024-06-13 NOTE — Telephone Encounter (Signed)
 Sent message back to Hailey Perkins/[employer] with updates from pt to see if they can contact pt to help troubleshoot. Waiting on response

## 2024-06-14 ENCOUNTER — Encounter: Payer: Self-pay | Admitting: Family Medicine

## 2024-06-17 ENCOUNTER — Ambulatory Visit (HOSPITAL_BASED_OUTPATIENT_CLINIC_OR_DEPARTMENT_OTHER): Admitting: Physical Therapy

## 2024-06-17 DIAGNOSIS — R2689 Other abnormalities of gait and mobility: Secondary | ICD-10-CM

## 2024-06-17 DIAGNOSIS — R2681 Unsteadiness on feet: Secondary | ICD-10-CM

## 2024-06-17 DIAGNOSIS — M6281 Muscle weakness (generalized): Secondary | ICD-10-CM

## 2024-06-17 NOTE — Therapy (Signed)
 OUTPATIENT PHYSICAL THERAPY LOWER EXTREMITY TREATMENT   Patient Name: Hailey Perkins MRN: [id number] DOB:1935-07-25, 89 y.o., female Today's Date: 06/17/2024  END OF SESSION:  PT End of Session - 06/17/24 1241     Visit Number 6    Number of Visits 12    Date for PT Re-Evaluation 06/26/24    Authorization Type [employer] MCR    PT Start Time 1200    PT Stop Time 1238    PT Time Calculation (min) 38 min    Activity Tolerance Patient tolerated treatment well    Behavior During Therapy WFL for tasks assessed/performed           Past Medical History:  Diagnosis Date   Aortic atherosclerosis (HCC)    Arthritis    Cataract 2010   bilateral eyes   Chicken pox    Cholelithiasis    Diverticulitis    Diverticulosis    GERD (gastroesophageal reflux disease)    Glaucoma    Hiatal hernia    History of frequent urinary tract infections    Hyperlipidemia    Osteopenia    Peripheral neuropathy    Sleep apnea    wears a CPAP   Urine incontinence    Past Surgical History:  Procedure Laterality Date   ABDOMINAL HYSTERECTOMY  1977   ANAL FISTULECTOMY  1985   ANKLE FRACTURE SURGERY Right 2014   APPENDECTOMY     BIOPSY  02/15/2021   Procedure: BIOPSY;  Surgeon: Wilhelmenia Aloha Raddle., MD;  Location: [facility];  Service: Gastroenterology;;   BREAST BIOPSY Bilateral 10+ yrs ago   BENIGN   BUNIONECTOMY Left 1975   Left Toe   CATARACT EXTRACTION, BILATERAL  2009   COLONOSCOPY WITH PROPOFOL  N/A 02/15/2021   Procedure: COLONOSCOPY WITH PROPOFOL ;  Surgeon: Wilhelmenia Aloha Raddle., MD;  Location: [facility];  Service: Gastroenterology;  Laterality: N/A;  ultraslimscope    DILATION AND CURETTAGE OF UTERUS  1972   eye PRK vision correction Left 10/09/2013   eye socket re-formed Left 2014   FOOT NEUROMA SURGERY Right 2003   plus bunionectomy, right great toe   HAMMER TOE SURGERY Right 2005   Right foot correction hammertoe little toe   HEMORRHOID SURGERY  1967   HIP SURGERY Right  2014   right hip femur pin implanted   KNEE ARTHROSCOPY Left 2010   debridement left knee scar tissue   LAPAROSCOPIC OOPHERECTOMY     1986, 1990   LASIK  1998   NEUROMA SURGERY Right 2003   Right Foot Neuroma, plus Bunionectomy, Right Great Toe   POLYPECTOMY  02/15/2021   Procedure: POLYPECTOMY;  Surgeon: Wilhelmenia Aloha Raddle., MD;  Location: [facility];  Service: Gastroenterology;;   RECTOCELE REPAIR  1968   REPLACEMENT TOTAL KNEE Left 01/2008   REPLACEMENT TOTAL KNEE Right 08/2008   retinal pucker correction Left 07/14/2012   schwannoma tumor (benign) removal  2011   right rib   SIGMOIDOSCOPY     TONSILLECTOMY AND ADENOIDECTOMY     VENTRAL HERNIA REPAIR  1993   Patient Active Problem List   Diagnosis Date Noted   Arthralgia of right elbow 08/24/2022   Bilateral wrist pain 08/24/2022   Neck pain 08/24/2022   Pain of right hip joint 08/24/2022   Chronic pain of right knee 05/27/2021   OSA on CPAP 05/27/2021   LLQ pain 12/16/2020   History of diverticulitis 12/16/2020   Hematochezia 12/16/2020   Abnormal colonoscopy 12/16/2020   Acquired trigger finger of right index finger  10/26/2020   Other specified arthritis, right hand 10/26/2020   Diverticulitis of colon 08/15/2020   Dry eyes, bilateral 10/10/2019   Sleep-related hypoxia 08/05/2019   OSA (obstructive sleep apnea) 08/05/2019   PLMD (periodic limb movement disorder) 08/05/2019   Cerebellar 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) 05/06/2019   Excessive daytime sleepiness 05/06/2019   Loud snoring 05/06/2019   Ventral hernia without obstruction or gangrene 02/22/2019   Chronic throat clearing 01/18/2019   Nocturnal cough 01/18/2019   Family history of colon cancer in father 01/18/2019   Diverticulosis 01/18/2019   Gait abnormality 12/27/2018   Numbness 12/27/2018   Status post total bilateral knee replacement 11/30/2018   DDD (degenerative disc disease), cervical 11/30/2018   Status post carpal tunnel  release 11/30/2018   DDD (degenerative disc disease), lumbar 10/26/2018   Primary osteoarthritis of both hands 10/26/2018   Primary osteoarthritis of both feet 10/26/2018   Gastroesophageal reflux disease 08/27/2018   Bilateral impacted cerumen 08/21/2018   Sensorineural hearing loss (SNHL) of both ears 08/21/2018   Tinnitus, bilateral 08/21/2018   Blood in urine 04/17/2018   Secondary open-angle glaucoma of left eye, moderate stage 03/10/2018   Early stage 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 10/05/2017   Epiretinal membrane (ERM) of left eye 10/05/2017   Pseudophakia of both eyes 10/05/2017   Osteopenia 03/19/2017   Mixed hyperlipidemia 08/18/2016   Generalized osteoarthritis of multiple sites 08/18/2016   Peripheral neuropathic pain 08/18/2016   Overactive bladder 08/18/2016   Glaucoma 08/18/2016   Cervical disc disorder with radiculopathy of cervical region 08/17/2016   Carpal tunnel syndrome, right upper limb 08/17/2016   Abnormal finding on mammography 06/14/2016   Breast lump 05/06/2015    PCP: Yolande Toribio MATSU, MD  REFERRING PROVIDER: Yolande Toribio MATSU, MD  REFERRING DIAG: R26.81 (ICD-10-CM) - Unsteadiness on feet/Unsteady gait  THERAPY DIAG:  Other abnormalities of gait and mobility  Unsteadiness on feet  Muscle weakness (generalized)  Rationale for Evaluation and Treatment: Rehabilitation  ONSET DATE: 2021 ; PT order 04/04/2024  SUBJECTIVE:   SUBJECTIVE STATEMENT: Pt reports she had a misstep when she was stepping down a curb.  She didn't fall though it jolted her legs.  She felt the jolt in her L calf and R knee.  She didn't have any pain afterwards though it scared her.  Pt denies pain currently.  Pt states I was so exhausted after prior Rx.  Pt reports compliance with HEP. Pt uses a SPC with walking.    PERTINENT HISTORY: Flexor tenotomy on 3 toes of R foot on 05/01/24--Pt reports she has no restrictions from MD. Arthritis,  osteopenia, peripheral neuropathy Bilat TKR  PAIN:  Pt denies pain currently  PRECAUTIONS: Fall and Other: peripheral neuropathy   WEIGHT BEARING RESTRICTIONS: No  FALLS:  Has patient fallen in last 6 months? No  LIVING ENVIRONMENT: Lives with: lives alone Lives in:  1 story home Stairs: No Has following equipment at home: Single point cane, shower chair, and Grab bars  OCCUPATION:  Retired  PLOF: Independent  PATIENT GOALS: improved ability to pivot.  Improved walking on uneven terrain and performing stairs   OBJECTIVE:  Note: Objective measures were completed at Evaluation unless otherwise noted.  DIAGNOSTIC FINDINGS: N/A  TREATMENT DATE:    06/17/2024:  Therapeutic Exercise: Nustep L4 UE/LEs x 6 mins Sit to stands from chair x10, 7 reps with GTB above knees without UE support Seated DF 2x10   Neuro Re-ed Activities: F/B swaying and S/S swaying x 1 sets of 10 reps on floor and 1 set of 10 reps on airex with min assist and UE support as needed  Romberg standing on airex 2x30 sec with UE support as needed and CGA/min assist Staggered standing on airex with UE support as needed with CGA/min assist  Marching on airex 2x10 with UE support Standing on airex with NBOS 3x30 sec Alt toe tapping on 6 inch step with frequent UE support with CGA occasional min assist and one occasion of mod assist Sidestepping without UE support x 2 laps at rail Lateral stepping over 1 hurdle with UE support approx 10 reps Pt ambulated over 3 hurdles with UE support on rail with a step to gait pattern and with a reciprocal gait pattern with SBA/CGA  PATIENT EDUCATION:  Education details:  HEP, rationale of interventions, POC, dx, exercise form, and relevant anatomy.  PT answered pt's questions.   Person educated: Patient Education method: Explanation,  demonstration, verbal cues Education comprehension: verbalized understanding and needs further education, verbal cues required, returned demonstration  HOME EXERCISE PROGRAM: Access Code: [email or web address] URL: [email or web address] Date: 05/21/2024 Prepared by: Mose Minerva  Exercises - Seated March  - 1 x daily - 7 x weekly - 2 sets - 10 reps - Seated Ankle Dorsiflexion AROM  - 1 x daily - 7 x weekly - 2 sets - 10 reps - Sit to Stand  - 1 x daily - 5-6 x weekly - 2 sets - 5-8 reps - Side Stepping with Counter Support  - 1 x daily - 7 x weekly - 2 sets - 5-10 reps  ASSESSMENT:  CLINICAL IMPRESSION: Pt performed therapeutic exercise and neuro re-ed activities well.  She performed neuro re-ed activities to improve balance and proprioception, stability with daily mobility, foot clearance, and to reduce fall risk.  PT worked on swaying on floor and airex pad to improve corrective strategies including to improve posterior LOB.  Pt has increased LOB with backward sway.  Pt required decreased assistance and demonstrates improved speed with sidestepping.  PT had pt perform stepping over 3 hurdles to improve foot clearance with gait.  She required UE support on rail and SBA/CGA.  Pt is improving with sit to stand transfers and does not require UE support from the chair.  Pt responded well to Rx having no c/o's after Rx.  Pt will benefit from continued skilled PT to improve balance, strength, stability, and functional mobility and to decrease fall risk.       OBJECTIVE IMPAIRMENTS: Abnormal gait, decreased activity tolerance, decreased balance, decreased endurance, decreased mobility, difficulty walking, and decreased strength.   ACTIVITY LIMITATIONS: standing, squatting, stairs, transfers, and locomotion level  PARTICIPATION LIMITATIONS: cleaning and community activity  PERSONAL FACTORS: 3+ comorbidities: Arthritis, bilat TKA, peripheral neuropathy are also affecting patient's functional  outcome.   REHAB POTENTIAL: Good  CLINICAL DECISION MAKING: Stable/uncomplicated  EVALUATION COMPLEXITY: Low   GOALS:   SHORT TERM GOALS: Target date: 06/05/2024  Pt will be independent and compliant with HEP for improved strength, balance, stability, and mobility.  Baseline: Goal status: INITIAL  2.  Pt will demo improved time on TUG test by at least 5 seconds for improved mobility and reduced fall risk.   Baseline:  Goal status:  INITIAL  3.  Pt will report at least a 25% improvement in her balance.   Baseline:  Goal status: INITIAL    LONG TERM GOALS: Target date:  06/26/2024   Pt will score at least 44/56 on the Berg Balance Assessment for improved balance and stability with daily tasks and mobility.  Baseline:  35/56 Goal status: INITIAL--Pt completed [employer] today.    2.  Pt will be able to load and unload her dishwasher with good stability and good confidence.  Baseline:  Goal status: INITIAL  3.  Pt will demo improved bilat LE strength to 4+/5 in hip flexion, 5/5 in knee extension, 4-/5 in R ankle DF, and a 5# increase in L hip abduction for improved performance of functional mobility and performance of household chores.  Baseline:  Goal status: INITIAL  4.  Pt will be able to ascend and descend stairs with a reciprocal gait with the rail.   Baseline:  Goal status: INITIAL  5.  Pt will report she is able to perform her normal ambulation with cane with good stability and confidence and without LOB. Baseline:  Goal status: INITIAL   PLAN:  PT FREQUENCY: 2x/week  PT DURATION: 6 weeks  PLANNED INTERVENTIONS: 02835- PT Re-evaluation, 97750- Physical Performance Testing, 97110-Therapeutic exercises, 97530- Therapeutic activity, [id number]- Neuromuscular re-education, 97535- Self Care, 02859- Manual therapy, [phone]- Gait training, [phone]- Aquatic Therapy, [phone]- Electrical stimulation (unattended), Patient/Family education, Balance training, Stair training,  Taping, DME instructions, Cryotherapy, and Moist heat  PLAN FOR NEXT SESSION: Work on [employer].  LE strengthening and balance activities.  Sit to stands.   Leigh Minerva III PT, DPT 06/18/24 1:20 PM

## 2024-06-18 ENCOUNTER — Encounter (HOSPITAL_BASED_OUTPATIENT_CLINIC_OR_DEPARTMENT_OTHER): Payer: Self-pay | Admitting: Physical Therapy

## 2024-06-20 ENCOUNTER — Encounter (HOSPITAL_BASED_OUTPATIENT_CLINIC_OR_DEPARTMENT_OTHER): Payer: Self-pay | Admitting: Physical Therapy

## 2024-06-20 ENCOUNTER — Ambulatory Visit (HOSPITAL_BASED_OUTPATIENT_CLINIC_OR_DEPARTMENT_OTHER): Admitting: Physical Therapy

## 2024-06-20 DIAGNOSIS — R2681 Unsteadiness on feet: Secondary | ICD-10-CM

## 2024-06-20 DIAGNOSIS — R2689 Other abnormalities of gait and mobility: Secondary | ICD-10-CM

## 2024-06-20 DIAGNOSIS — M6281 Muscle weakness (generalized): Secondary | ICD-10-CM

## 2024-06-20 NOTE — Therapy (Signed)
 OUTPATIENT PHYSICAL THERAPY LOWER EXTREMITY TREATMENT   Patient Name: Hailey Perkins MRN: [id number] DOB:1935/08/03, 89 y.o., female Today's Date: 06/21/2024  END OF SESSION:  PT End of Session - 06/20/24 1029     Visit Number 7    Number of Visits 12    Date for PT Re-Evaluation 06/26/24    Authorization Type BCBS MCR    PT Start Time 1027    PT Stop Time 1108    PT Time Calculation (min) 41 min    Activity Tolerance Patient tolerated treatment well    Behavior During Therapy WFL for tasks assessed/performed           Past Medical History:  Diagnosis Date   Aortic atherosclerosis (HCC)    Arthritis    Cataract 2010   bilateral eyes   Chicken pox    Cholelithiasis    Diverticulitis    Diverticulosis    GERD (gastroesophageal reflux disease)    Glaucoma    Hiatal hernia    History of frequent urinary tract infections    Hyperlipidemia    Osteopenia    Peripheral neuropathy    Sleep apnea    wears a CPAP   Urine incontinence    Past Surgical History:  Procedure Laterality Date   ABDOMINAL HYSTERECTOMY  1977   ANAL FISTULECTOMY  1985   ANKLE FRACTURE SURGERY Right 2014   APPENDECTOMY     BIOPSY  02/15/2021   Procedure: BIOPSY;  Surgeon: Wilhelmenia Aloha Raddle., MD;  Location: [facility];  Service: Gastroenterology;;   BREAST BIOPSY Bilateral 10+ yrs ago   BENIGN   BUNIONECTOMY Left 1975   Left Toe   CATARACT EXTRACTION, BILATERAL  2009   COLONOSCOPY WITH PROPOFOL  N/A 02/15/2021   Procedure: COLONOSCOPY WITH PROPOFOL ;  Surgeon: Wilhelmenia Aloha Raddle., MD;  Location: [facility];  Service: Gastroenterology;  Laterality: N/A;  ultraslimscope    DILATION AND CURETTAGE OF UTERUS  1972   eye PRK vision correction Left 10/09/2013   eye socket re-formed Left 2014   FOOT NEUROMA SURGERY Right 2003   plus bunionectomy, right great toe   HAMMER TOE SURGERY Right 2005   Right foot correction hammertoe little toe   HEMORRHOID SURGERY  1967   HIP SURGERY Right  2014   right hip femur pin implanted   KNEE ARTHROSCOPY Left 2010   debridement left knee scar tissue   LAPAROSCOPIC OOPHERECTOMY     1986, 1990   LASIK  1998   NEUROMA SURGERY Right 2003   Right Foot Neuroma, plus Bunionectomy, Right Great Toe   POLYPECTOMY  02/15/2021   Procedure: POLYPECTOMY;  Surgeon: Wilhelmenia Aloha Raddle., MD;  Location: [facility];  Service: Gastroenterology;;   RECTOCELE REPAIR  1968   REPLACEMENT TOTAL KNEE Left 01/2008   REPLACEMENT TOTAL KNEE Right 08/2008   retinal pucker correction Left 07/14/2012   schwannoma tumor (benign) removal  2011   right rib   SIGMOIDOSCOPY     TONSILLECTOMY AND ADENOIDECTOMY     VENTRAL HERNIA REPAIR  1993   Patient Active Problem List   Diagnosis Date Noted   Arthralgia of right elbow 08/24/2022   Bilateral wrist pain 08/24/2022   Neck pain 08/24/2022   Pain of right hip joint 08/24/2022   Chronic pain of right knee 05/27/2021   OSA on CPAP 05/27/2021   LLQ pain 12/16/2020   History of diverticulitis 12/16/2020   Hematochezia 12/16/2020   Abnormal colonoscopy 12/16/2020   Acquired trigger finger of right index finger  10/26/2020   Other specified arthritis, right hand 10/26/2020   Diverticulitis of colon 08/15/2020   Dry eyes, bilateral 10/10/2019   Sleep-related hypoxia 08/05/2019   OSA (obstructive sleep apnea) 08/05/2019   PLMD (periodic limb movement disorder) 08/05/2019   Cerebellar 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) 05/06/2019   Excessive daytime sleepiness 05/06/2019   Loud snoring 05/06/2019   Ventral hernia without obstruction or gangrene 02/22/2019   Chronic throat clearing 01/18/2019   Nocturnal cough 01/18/2019   Family history of colon cancer in father 01/18/2019   Diverticulosis 01/18/2019   Gait abnormality 12/27/2018   Numbness 12/27/2018   Status post total bilateral knee replacement 11/30/2018   DDD (degenerative disc disease), cervical 11/30/2018   Status post carpal tunnel  release 11/30/2018   DDD (degenerative disc disease), lumbar 10/26/2018   Primary osteoarthritis of both hands 10/26/2018   Primary osteoarthritis of both feet 10/26/2018   Gastroesophageal reflux disease 08/27/2018   Bilateral impacted cerumen 08/21/2018   Sensorineural hearing loss (SNHL) of both ears 08/21/2018   Tinnitus, bilateral 08/21/2018   Blood in urine 04/17/2018   Secondary open-angle glaucoma of left eye, moderate stage 03/10/2018   Early stage 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 10/05/2017   Epiretinal membrane (ERM) of left eye 10/05/2017   Pseudophakia of both eyes 10/05/2017   Osteopenia 03/19/2017   Mixed hyperlipidemia 08/18/2016   Generalized osteoarthritis of multiple sites 08/18/2016   Peripheral neuropathic pain 08/18/2016   Overactive bladder 08/18/2016   Glaucoma 08/18/2016   Cervical disc disorder with radiculopathy of cervical region 08/17/2016   Carpal tunnel syndrome, right upper limb 08/17/2016   Abnormal finding on mammography 06/14/2016   Breast lump 05/06/2015    PCP: Yolande Toribio MATSU, MD  REFERRING PROVIDER: Yolande Toribio MATSU, MD  REFERRING DIAG: R26.81 (ICD-10-CM) - Unsteadiness on feet/Unsteady gait  THERAPY DIAG:  Other abnormalities of gait and mobility  Unsteadiness on feet  Muscle weakness (generalized)  Rationale for Evaluation and Treatment: Rehabilitation  ONSET DATE: 2021 ; PT order 04/04/2024  SUBJECTIVE:   SUBJECTIVE STATEMENT: Pt states she wasn't as tired after prior Rx as the Rx before.   My balance is improving.  Pt states she has caught herself when she lost her balance which would not have happened prior.  Pt reports compliance with HEP. Pt uses a SPC with walking.    PERTINENT HISTORY: Flexor tenotomy on 3 toes of R foot on 05/01/24--Pt reports she has no restrictions from MD. Arthritis, osteopenia, peripheral neuropathy Bilat TKR  PAIN:  Pt denies pain currently  PRECAUTIONS: Fall  and Other: peripheral neuropathy   WEIGHT BEARING RESTRICTIONS: No  FALLS:  Has patient fallen in last 6 months? No  LIVING ENVIRONMENT: Lives with: lives alone Lives in:  1 story home Stairs: No Has following equipment at home: Single point cane, shower chair, and Grab bars  OCCUPATION:  Retired  PLOF: Independent  PATIENT GOALS: improved ability to pivot.  Improved walking on uneven terrain and performing stairs   OBJECTIVE:  Note: Objective measures were completed at Evaluation unless otherwise noted.  DIAGNOSTIC FINDINGS: N/A  TREATMENT DATE:    06/20/2024:  Therapeutic Exercise: Nustep L4-5 UE/LEs x 6 mins Seated DF 2x10 Ankle DF with RTB 2x10   Neuro Re-ed Activities: F/B swaying and S/S swaying x 1 sets of 10 reps on floor and 2 sets of 10 reps on airex with min assist and UE support as needed  Romberg standing on airex 2x30 sec with UE support as needed and CGA/min assist Staggered standing on airex with UE support as needed with CGA/min assist 2x30 sec Marching on airex 2x10 with UE support Standing on airex with NBOS 3x30 sec Lateral stepping over 1 hurdle with UE support approx 10 reps Pt ambulated over 3 hurdles with UE support on rail with a step to gait pattern and with a reciprocal gait pattern with SBA/CGA Ambulated bwds with SBA with UE support on rail Sidestepping around cones with min assist  PATIENT EDUCATION:  Education details:  HEP, rationale of interventions, POC, dx, exercise form, and relevant anatomy.  PT answered pt's questions.   Person educated: Patient Education method: Explanation, demonstration, verbal cues Education comprehension: verbalized understanding and needs further education, verbal cues required, returned demonstration  HOME EXERCISE PROGRAM: Access Code: [email or web address] URL:  [email or web address] Date: 05/21/2024 Prepared by: Mose Minerva  Exercises - Seated March  - 1 x daily - 7 x weekly - 2 sets - 10 reps - Seated Ankle Dorsiflexion AROM  - 1 x daily - 7 x weekly - 2 sets - 10 reps - Sit to Stand  - 1 x daily - 5-6 x weekly - 2 sets - 5-8 reps - Side Stepping with Counter Support  - 1 x daily - 7 x weekly - 2 sets - 5-10 reps  ASSESSMENT:  CLINICAL IMPRESSION: Pt reports improved balance including LOB correction.  She seemed to have improved seated DF AROM.  She performed neuro re-ed activities to improve balance and proprioception, stability with daily mobility, foot clearance, and to reduce fall risk.  PT had pt perform stepping over 3 hurdles to improve foot clearance with gait.  She required UE support on rail and SBA/CGA.  PT had pt perform sidestepping around cones which involved turning.  She required cuing for correct form and turning around cones and min assist for LOB.  Pt gives great effort with all exercises and activities.  She responded well to Rx having no c/o's after Rx.  Pt will benefit from continued skilled PT to improve balance, strength, stability, and functional mobility and to decrease fall risk.       OBJECTIVE IMPAIRMENTS: Abnormal gait, decreased activity tolerance, decreased balance, decreased endurance, decreased mobility, difficulty walking, and decreased strength.   ACTIVITY LIMITATIONS: standing, squatting, stairs, transfers, and locomotion level  PARTICIPATION LIMITATIONS: cleaning and community activity  PERSONAL FACTORS: 3+ comorbidities: Arthritis, bilat TKA, peripheral neuropathy are also affecting patient's functional outcome.   REHAB POTENTIAL: Good  CLINICAL DECISION MAKING: Stable/uncomplicated  EVALUATION COMPLEXITY: Low   GOALS:   SHORT TERM GOALS: Target date: 06/05/2024  Pt will be independent and compliant with HEP for improved strength, balance, stability, and mobility.  Baseline: Goal  status: INITIAL  2.  Pt will demo improved time on TUG test by at least 5 seconds for improved mobility and reduced fall risk.   Baseline:  Goal status: INITIAL  3.  Pt will report at least a 25% improvement in her balance.   Baseline:  Goal status: INITIAL    LONG TERM GOALS: Target date:  06/26/2024   Pt will score  at least 44/56 on the [facility] Assessment for improved balance and stability with daily tasks and mobility.  Baseline:  35/56 Goal status: INITIAL--Pt completed [employer] today.    2.  Pt will be able to load and unload her dishwasher with good stability and good confidence.  Baseline:  Goal status: INITIAL  3.  Pt will demo improved bilat LE strength to 4+/5 in hip flexion, 5/5 in knee extension, 4-/5 in R ankle DF, and a 5# increase in L hip abduction for improved performance of functional mobility and performance of household chores.  Baseline:  Goal status: INITIAL  4.  Pt will be able to ascend and descend stairs with a reciprocal gait with the rail.   Baseline:  Goal status: INITIAL  5.  Pt will report she is able to perform her normal ambulation with cane with good stability and confidence and without LOB. Baseline:  Goal status: INITIAL   PLAN:  PT FREQUENCY: 2x/week  PT DURATION: 6 weeks  PLANNED INTERVENTIONS: 02835- PT Re-evaluation, 97750- Physical Performance Testing, 97110-Therapeutic exercises, 97530- Therapeutic activity, [id number]- Neuromuscular re-education, 97535- Self Care, 02859- Manual therapy, [phone]- Gait training, [phone]- Aquatic Therapy, [phone]- Electrical stimulation (unattended), Patient/Family education, Balance training, Stair training, Taping, DME instructions, Cryotherapy, and Moist heat  PLAN FOR NEXT SESSION: Work on [employer].  LE strengthening and balance activities.  Sit to stands.   Leigh Minerva III PT, DPT 06/21/24 12:29 PM

## 2024-06-24 ENCOUNTER — Ambulatory Visit (HOSPITAL_BASED_OUTPATIENT_CLINIC_OR_DEPARTMENT_OTHER): Admitting: Physical Therapy

## 2024-06-24 ENCOUNTER — Encounter (HOSPITAL_BASED_OUTPATIENT_CLINIC_OR_DEPARTMENT_OTHER): Payer: Self-pay | Admitting: Physical Therapy

## 2024-06-24 DIAGNOSIS — M6281 Muscle weakness (generalized): Secondary | ICD-10-CM

## 2024-06-24 DIAGNOSIS — R2681 Unsteadiness on feet: Secondary | ICD-10-CM

## 2024-06-24 DIAGNOSIS — R2689 Other abnormalities of gait and mobility: Secondary | ICD-10-CM

## 2024-06-24 NOTE — Therapy (Signed)
 OUTPATIENT PHYSICAL THERAPY LOWER EXTREMITY TREATMENT   Patient Name: Hailey Perkins MRN: [id number] DOB:1935-06-05, 89 y.o., female Today's Date: 06/24/2024  END OF SESSION:  PT End of Session - 06/24/24 1052     Visit Number 8    Number of Visits 12    Date for PT Re-Evaluation 06/26/24    Authorization Type BCBS MCR    PT Start Time 1029    PT Stop Time 1108    PT Time Calculation (min) 39 min    Activity Tolerance Patient tolerated treatment well    Behavior During Therapy WFL for tasks assessed/performed            Past Medical History:  Diagnosis Date   Aortic atherosclerosis (HCC)    Arthritis    Cataract 2010   bilateral eyes   Chicken pox    Cholelithiasis    Diverticulitis    Diverticulosis    GERD (gastroesophageal reflux disease)    Glaucoma    Hiatal hernia    History of frequent urinary tract infections    Hyperlipidemia    Osteopenia    Peripheral neuropathy    Sleep apnea    wears a CPAP   Urine incontinence    Past Surgical History:  Procedure Laterality Date   ABDOMINAL HYSTERECTOMY  1977   ANAL FISTULECTOMY  1985   ANKLE FRACTURE SURGERY Right 2014   APPENDECTOMY     BIOPSY  02/15/2021   Procedure: BIOPSY;  Surgeon: Wilhelmenia Aloha Raddle., MD;  Location: [facility];  Service: Gastroenterology;;   BREAST BIOPSY Bilateral 10+ yrs ago   BENIGN   BUNIONECTOMY Left 1975   Left Toe   CATARACT EXTRACTION, BILATERAL  2009   COLONOSCOPY WITH PROPOFOL  N/A 02/15/2021   Procedure: COLONOSCOPY WITH PROPOFOL ;  Surgeon: Wilhelmenia Aloha Raddle., MD;  Location: [facility];  Service: Gastroenterology;  Laterality: N/A;  ultraslimscope    DILATION AND CURETTAGE OF UTERUS  1972   eye PRK vision correction Left 10/09/2013   eye socket re-formed Left 2014   FOOT NEUROMA SURGERY Right 2003   plus bunionectomy, right great toe   HAMMER TOE SURGERY Right 2005   Right foot correction hammertoe little toe   HEMORRHOID SURGERY  1967   HIP SURGERY  Right 2014   right hip femur pin implanted   KNEE ARTHROSCOPY Left 2010   debridement left knee scar tissue   LAPAROSCOPIC OOPHERECTOMY     1986, 1990   LASIK  1998   NEUROMA SURGERY Right 2003   Right Foot Neuroma, plus Bunionectomy, Right Great Toe   POLYPECTOMY  02/15/2021   Procedure: POLYPECTOMY;  Surgeon: Wilhelmenia Aloha Raddle., MD;  Location: [facility];  Service: Gastroenterology;;   RECTOCELE REPAIR  1968   REPLACEMENT TOTAL KNEE Left 01/2008   REPLACEMENT TOTAL KNEE Right 08/2008   retinal pucker correction Left 07/14/2012   schwannoma tumor (benign) removal  2011   right rib   SIGMOIDOSCOPY     TONSILLECTOMY AND ADENOIDECTOMY     VENTRAL HERNIA REPAIR  1993   Patient Active Problem List   Diagnosis Date Noted   Arthralgia of right elbow 08/24/2022   Bilateral wrist pain 08/24/2022   Neck pain 08/24/2022   Pain of right hip joint 08/24/2022   Chronic pain of right knee 05/27/2021   OSA on CPAP 05/27/2021   LLQ pain 12/16/2020   History of diverticulitis 12/16/2020   Hematochezia 12/16/2020   Abnormal colonoscopy 12/16/2020   Acquired trigger finger of right index  finger 10/26/2020   Other specified arthritis, right hand 10/26/2020   Diverticulitis of colon 08/15/2020   Dry eyes, bilateral 10/10/2019   Sleep-related hypoxia 08/05/2019   OSA (obstructive sleep apnea) 08/05/2019   PLMD (periodic limb movement disorder) 08/05/2019   Cerebellar 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) 05/06/2019   Excessive daytime sleepiness 05/06/2019   Loud snoring 05/06/2019   Ventral hernia without obstruction or gangrene 02/22/2019   Chronic throat clearing 01/18/2019   Nocturnal cough 01/18/2019   Family history of colon cancer in father 01/18/2019   Diverticulosis 01/18/2019   Gait abnormality 12/27/2018   Numbness 12/27/2018   Status post total bilateral knee replacement 11/30/2018   DDD (degenerative disc disease), cervical 11/30/2018   Status post carpal  tunnel release 11/30/2018   DDD (degenerative disc disease), lumbar 10/26/2018   Primary osteoarthritis of both hands 10/26/2018   Primary osteoarthritis of both feet 10/26/2018   Gastroesophageal reflux disease 08/27/2018   Bilateral impacted cerumen 08/21/2018   Sensorineural hearing loss (SNHL) of both ears 08/21/2018   Tinnitus, bilateral 08/21/2018   Blood in urine 04/17/2018   Secondary open-angle glaucoma of left eye, moderate stage 03/10/2018   Early stage 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 10/05/2017   Epiretinal membrane (ERM) of left eye 10/05/2017   Pseudophakia of both eyes 10/05/2017   Osteopenia 03/19/2017   Mixed hyperlipidemia 08/18/2016   Generalized osteoarthritis of multiple sites 08/18/2016   Peripheral neuropathic pain 08/18/2016   Overactive bladder 08/18/2016   Glaucoma 08/18/2016   Cervical disc disorder with radiculopathy of cervical region 08/17/2016   Carpal tunnel syndrome, right upper limb 08/17/2016   Abnormal finding on mammography 06/14/2016   Breast lump 05/06/2015    PCP: Yolande Toribio MATSU, MD  REFERRING PROVIDER: Yolande Toribio MATSU, MD  REFERRING DIAG: R26.81 (ICD-10-CM) - Unsteadiness on feet/Unsteady gait  THERAPY DIAG:  Other abnormalities of gait and mobility  Unsteadiness on feet  Muscle weakness (generalized)  Rationale for Evaluation and Treatment: Rehabilitation  ONSET DATE: 2021 ; PT order 04/04/2024  SUBJECTIVE:   SUBJECTIVE STATEMENT: Pt denies any adverse effects after prior Rx.  Pt reports she was able to go up and down her daughter's stairs easier.  Pt states she is not so concerned about going up a low curb though is concerned and a little scared going down the curb.  Pt denies pain currently though did have some pain in her L posterior hip yesterday.    PERTINENT HISTORY: Pt uses a SPC with wlaking.  Flexor tenotomy on 3 toes of R foot on 05/01/24--Pt reports she has no restrictions from  MD. Arthritis, osteopenia, peripheral neuropathy Bilat TKR  PAIN:  Pt denies pain currently  PRECAUTIONS: Fall and Other: peripheral neuropathy   WEIGHT BEARING RESTRICTIONS: No  FALLS:  Has patient fallen in last 6 months? No  LIVING ENVIRONMENT: Lives with: lives alone Lives in:  1 story home Stairs: No Has following equipment at home: Single point cane, shower chair, and Grab bars  OCCUPATION:  Retired  PLOF: Independent  PATIENT GOALS: improved ability to pivot.  Improved walking on uneven terrain and performing stairs   OBJECTIVE:  Note: Objective measures were completed at Evaluation unless otherwise noted.  DIAGNOSTIC FINDINGS: N/A  TREATMENT DATE:    06/24/2024:  Therapeutic Exercise: Nustep L4-5 UE/LEs x 6 mins Seated DF 2x10 Ankle DF with RTB 2x10   Neuro Re-ed Activities: F/B swaying x 1 set on floor and 2 sets on airex and S/S swaying x 2 sets of 10 reps airex with SBA/CGA and UE support as needed  Romberg standing on airex 2x30 sec with UE support as needed and CGA/min assist Marching on airex 2x10 with UE support Pt ambulated over 3 hurdles with UE support on rail with a step to gait pattern and with a reciprocal gait pattern with SBA/CGA Ambulated bwds with SBA with UE support on rail x 3 laps Ambulated around cones with CGA Sidestepping around cones with pivoting with min assist  PATIENT EDUCATION:  Education details:  HEP, rationale of interventions, POC, dx, exercise form, and relevant anatomy.  PT answered pt's questions.   Person educated: Patient Education method: Explanation, demonstration, verbal cues Education comprehension: verbalized understanding and needs further education, verbal cues required, returned demonstration  HOME EXERCISE PROGRAM: Access Code: [email or web address] URL:  [email or web address] Date: 05/21/2024 Prepared by: Mose Minerva  Exercises - Seated March  - 1 x daily - 7 x weekly - 2 sets - 10 reps - Seated Ankle Dorsiflexion AROM  - 1 x daily - 7 x weekly - 2 sets - 10 reps - Sit to Stand  - 1 x daily - 5-6 x weekly - 2 sets - 5-8 reps - Side Stepping with Counter Support  - 1 x daily - 7 x weekly - 2 sets - 5-10 reps  ASSESSMENT:  CLINICAL IMPRESSION: Pt is improving with function as evidenced by subjective reports of improved performance of daughter's stairs.  Pt demonstrates improved control, balance, and performance of F/B swaying.  Pt performed therapeutic exercise and neuro re-ed activities well and gives great effort with all exercises and activities.  She required cuing for correct form with pivoting/turning around cones and min assist for LOB with performing sidestepping.  Pt able to step over hurdles well though does require UE support on rail.  She responded well to Rx having no c/o's after Rx.  Pt will benefit from continued skilled PT to improve balance, strength, stability, and functional mobility and to decrease fall risk.        OBJECTIVE IMPAIRMENTS: Abnormal gait, decreased activity tolerance, decreased balance, decreased endurance, decreased mobility, difficulty walking, and decreased strength.   ACTIVITY LIMITATIONS: standing, squatting, stairs, transfers, and locomotion level  PARTICIPATION LIMITATIONS: cleaning and community activity  PERSONAL FACTORS: 3+ comorbidities: Arthritis, bilat TKA, peripheral neuropathy are also affecting patient's functional outcome.   REHAB POTENTIAL: Good  CLINICAL DECISION MAKING: Stable/uncomplicated  EVALUATION COMPLEXITY: Low   GOALS:   SHORT TERM GOALS: Target date: 06/05/2024  Pt will be independent and compliant with HEP for improved strength, balance, stability, and mobility.  Baseline: Goal status: INITIAL  2.  Pt will demo improved time on TUG test by at least  5 seconds for improved mobility and reduced fall risk.   Baseline:  Goal status: INITIAL  3.  Pt will report at least a 25% improvement in her balance.   Baseline:  Goal status: INITIAL    LONG TERM GOALS: Target date:  06/26/2024   Pt will score at least 44/56 on the Berg Balance Assessment for improved balance and stability with daily tasks and mobility.  Baseline:  35/56 Goal status: INITIAL--Pt completed [employer] today.    2.  Pt will be able to  load and unload her dishwasher with good stability and good confidence.  Baseline:  Goal status: INITIAL  3.  Pt will demo improved bilat LE strength to 4+/5 in hip flexion, 5/5 in knee extension, 4-/5 in R ankle DF, and a 5# increase in L hip abduction for improved performance of functional mobility and performance of household chores.  Baseline:  Goal status: INITIAL  4.  Pt will be able to ascend and descend stairs with a reciprocal gait with the rail.   Baseline:  Goal status: INITIAL  5.  Pt will report she is able to perform her normal ambulation with cane with good stability and confidence and without LOB. Baseline:  Goal status: INITIAL   PLAN:  PT FREQUENCY: 2x/week  PT DURATION: 6 weeks  PLANNED INTERVENTIONS: 02835- PT Re-evaluation, 97750- Physical Performance Testing, 97110-Therapeutic exercises, 97530- Therapeutic activity, [id number]- Neuromuscular re-education, 97535- Self Care, 02859- Manual therapy, [phone]- Gait training, [phone]- Aquatic Therapy, [phone]- Electrical stimulation (unattended), Patient/Family education, Balance training, Stair training, Taping, DME instructions, Cryotherapy, and Moist heat  PLAN FOR NEXT SESSION: Work on [employer].  LE strengthening and balance activities.  Work on step ups and step downs next visit.    Leigh Minerva III PT, DPT 06/24/24 12:51 PM

## 2024-06-27 ENCOUNTER — Ambulatory Visit (HOSPITAL_BASED_OUTPATIENT_CLINIC_OR_DEPARTMENT_OTHER): Admitting: Physical Therapy

## 2024-06-27 ENCOUNTER — Encounter (HOSPITAL_BASED_OUTPATIENT_CLINIC_OR_DEPARTMENT_OTHER): Payer: Self-pay | Admitting: Physical Therapy

## 2024-06-27 DIAGNOSIS — R2689 Other abnormalities of gait and mobility: Secondary | ICD-10-CM

## 2024-06-27 DIAGNOSIS — R2681 Unsteadiness on feet: Secondary | ICD-10-CM

## 2024-06-27 DIAGNOSIS — M6281 Muscle weakness (generalized): Secondary | ICD-10-CM

## 2024-06-27 NOTE — Therapy (Signed)
 OUTPATIENT PHYSICAL THERAPY LOWER EXTREMITY TREATMENT  Progress Note Reporting Period 05/15/2024 to 06/27/2024  See note below for Objective Data and Assessment of Progress/Goals.       Patient Name: Hailey Perkins MRN: [id number] DOB:18-Jul-1935, 89 y.o., female Today's Date: 06/28/2024  END OF SESSION:  PT End of Session - 06/27/24 1036     Visit Number 9    Number of Visits 12    Date for PT Re-Evaluation 08/08/24    Authorization Type BCBS MCR    PT Start Time 1030    PT Stop Time 1110    PT Time Calculation (min) 40 min    Activity Tolerance Patient tolerated treatment well    Behavior During Therapy WFL for tasks assessed/performed            Past Medical History:  Diagnosis Date   Aortic atherosclerosis (HCC)    Arthritis    Cataract 2010   bilateral eyes   Chicken pox    Cholelithiasis    Diverticulitis    Diverticulosis    GERD (gastroesophageal reflux disease)    Glaucoma    Hiatal hernia    History of frequent urinary tract infections    Hyperlipidemia    Osteopenia    Peripheral neuropathy    Sleep apnea    wears a CPAP   Urine incontinence    Past Surgical History:  Procedure Laterality Date   ABDOMINAL HYSTERECTOMY  1977   ANAL FISTULECTOMY  1985   ANKLE FRACTURE SURGERY Right 2014   APPENDECTOMY     BIOPSY  02/15/2021   Procedure: BIOPSY;  Surgeon: Wilhelmenia Aloha Raddle., MD;  Location: [facility];  Service: Gastroenterology;;   BREAST BIOPSY Bilateral 10+ yrs ago   BENIGN   BUNIONECTOMY Left 1975   Left Toe   CATARACT EXTRACTION, BILATERAL  2009   COLONOSCOPY WITH PROPOFOL  N/A 02/15/2021   Procedure: COLONOSCOPY WITH PROPOFOL ;  Surgeon: Wilhelmenia Aloha Raddle., MD;  Location: [facility];  Service: Gastroenterology;  Laterality: N/A;  ultraslimscope    DILATION AND CURETTAGE OF UTERUS  1972   eye PRK vision correction Left 10/09/2013   eye socket re-formed Left 2014   FOOT NEUROMA SURGERY Right 2003   plus bunionectomy, right  great toe   HAMMER TOE SURGERY Right 2005   Right foot correction hammertoe little toe   HEMORRHOID SURGERY  1967   HIP SURGERY Right 2014   right hip femur pin implanted   KNEE ARTHROSCOPY Left 2010   debridement left knee scar tissue   LAPAROSCOPIC OOPHERECTOMY     1986, 1990   LASIK  1998   NEUROMA SURGERY Right 2003   Right Foot Neuroma, plus Bunionectomy, Right Great Toe   POLYPECTOMY  02/15/2021   Procedure: POLYPECTOMY;  Surgeon: Wilhelmenia Aloha Raddle., MD;  Location: [facility];  Service: Gastroenterology;;   RECTOCELE REPAIR  1968   REPLACEMENT TOTAL KNEE Left 01/2008   REPLACEMENT TOTAL KNEE Right 08/2008   retinal pucker correction Left 07/14/2012   schwannoma tumor (benign) removal  2011   right rib   SIGMOIDOSCOPY     TONSILLECTOMY AND ADENOIDECTOMY     VENTRAL HERNIA REPAIR  1993   Patient Active Problem List   Diagnosis Date Noted   Arthralgia of right elbow 08/24/2022   Bilateral wrist pain 08/24/2022   Neck pain 08/24/2022   Pain of right hip joint 08/24/2022   Chronic pain of right knee 05/27/2021   OSA on CPAP 05/27/2021   LLQ pain 12/16/2020  History of diverticulitis 12/16/2020   Hematochezia 12/16/2020   Abnormal colonoscopy 12/16/2020   Acquired trigger finger of right index finger 10/26/2020   Other specified arthritis, right hand 10/26/2020   Diverticulitis of colon 08/15/2020   Dry eyes, bilateral 10/10/2019   Sleep-related hypoxia 08/05/2019   OSA (obstructive sleep apnea) 08/05/2019   PLMD (periodic limb movement disorder) 08/05/2019   Cerebellar 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) 05/06/2019   Excessive daytime sleepiness 05/06/2019   Loud snoring 05/06/2019   Ventral hernia without obstruction or gangrene 02/22/2019   Chronic throat clearing 01/18/2019   Nocturnal cough 01/18/2019   Family history of colon cancer in father 01/18/2019   Diverticulosis 01/18/2019   Gait abnormality 12/27/2018   Numbness 12/27/2018    Status post total bilateral knee replacement 11/30/2018   DDD (degenerative disc disease), cervical 11/30/2018   Status post carpal tunnel release 11/30/2018   DDD (degenerative disc disease), lumbar 10/26/2018   Primary osteoarthritis of both hands 10/26/2018   Primary osteoarthritis of both feet 10/26/2018   Gastroesophageal reflux disease 08/27/2018   Bilateral impacted cerumen 08/21/2018   Sensorineural hearing loss (SNHL) of both ears 08/21/2018   Tinnitus, bilateral 08/21/2018   Blood in urine 04/17/2018   Secondary open-angle glaucoma of left eye, moderate stage 03/10/2018   Early stage 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 10/05/2017   Epiretinal membrane (ERM) of left eye 10/05/2017   Pseudophakia of both eyes 10/05/2017   Osteopenia 03/19/2017   Mixed hyperlipidemia 08/18/2016   Generalized osteoarthritis of multiple sites 08/18/2016   Peripheral neuropathic pain 08/18/2016   Overactive bladder 08/18/2016   Glaucoma 08/18/2016   Cervical disc disorder with radiculopathy of cervical region 08/17/2016   Carpal tunnel syndrome, right upper limb 08/17/2016   Abnormal finding on mammography 06/14/2016   Breast lump 05/06/2015    PCP: Yolande Toribio MATSU, MD  REFERRING PROVIDER: Yolande Toribio MATSU, MD  REFERRING DIAG: R26.81 (ICD-10-CM) - Unsteadiness on feet/Unsteady gait  THERAPY DIAG:  Other abnormalities of gait and mobility  Unsteadiness on feet  Muscle weakness (generalized)  Rationale for Evaluation and Treatment: Rehabilitation  ONSET DATE: 2021 ; PT order 04/04/2024  SUBJECTIVE:   SUBJECTIVE STATEMENT: Pt denies pain currently.  Pt denies any adverse effects after prior Rx.  Pt denies pain currently.  Pt reports she was able to go up and down her daughter's stairs easier.  Pt has difficulty descending a curb.  I'm still having any issues with my balance.  Pt states her balance was awful yesterday.  Pt thinks she has improved a little  concerning her backwards LOB.     Pt reports a 40% improvement in her balance.  Pt reports a 30% improvement in loading/unloading the dishwasher.    Pt has been performing aquatic class 3x/wk.    PERTINENT HISTORY: Pt uses a SPC with wlaking.  Flexor tenotomy on 3 toes of R foot on 05/01/24--Pt reports she has no restrictions from MD. Arthritis, osteopenia, peripheral neuropathy Bilat TKR  PAIN:  Pt denies pain currently  PRECAUTIONS: Fall and Other: peripheral neuropathy   WEIGHT BEARING RESTRICTIONS: No  FALLS:  Has patient fallen in last 6 months? No  LIVING ENVIRONMENT: Lives with: lives alone Lives in:  1 story home Stairs: No Has following equipment at home: Single point cane, shower chair, and Grab bars  OCCUPATION:  Retired  PLOF: Independent  PATIENT GOALS: improved ability to pivot.  Improved walking on uneven terrain and performing stairs   OBJECTIVE:  Note: Objective measures were completed at Evaluation unless otherwise noted.  DIAGNOSTIC FINDINGS: N/A                                                                                                                                TREATMENT DATE:    06/27/2024:  Nustep L4-5 UE/LEs x 6 mins Ankle DF with RTB 2x10 on L, 1x10 each with YTB and RTB on R Seated marching with GTB 2x10 Step ups  4 inch step with UE support on rail x 6 reps each LE Step downs 4 inch step   FUNCTIONAL TESTS:  5x STS test:  Initial/Current:  15.35 without UE support / 16.17 sec without UE support TUG:  Initial/Current:  17.84 sec with SPC  / 13.52 sec with SPC  LOWER EXTREMITY MMT:   MMT Right eval Left eval Right 7/24 Left 7/24  Hip flexion 4/5 4/5 4/5 4/5  Hip extension        Hip abduction 24.6 19.1 25.4 25.8  Hip adduction        Hip internal rotation        Hip external rotation        Knee flexion 5/5 seated 5/5 seated    Knee extension 4-/5 4-/5 4/5 4/5  Ankle dorsiflexion Tol min resistance 4/5 Tol min  resistance 4+/5  Ankle plantarflexion        Ankle inversion        Ankle eversion         (Blank rows = not tested)  PATIENT SURVEYS:  ABC scale:  60%  /  41%   PATIENT EDUCATION:  Education details:  HEP, rationale of interventions, POC, dx, exercise form, objective findings, goal progress, and relevant anatomy.  PT answered pt's questions.   Person educated: Patient Education method: Explanation, demonstration, verbal cues Education comprehension: verbalized understanding and needs further education, verbal cues required, returned demonstration  HOME EXERCISE PROGRAM: Access Code: [email or web address] URL: [email or web address] Date: 05/21/2024 Prepared by: Mose Minerva  Exercises - Seated March  - 1 x daily - 7 x weekly - 2 sets - 10 reps - Seated Ankle Dorsiflexion AROM  - 1 x daily - 7 x weekly - 2 sets - 10 reps - Sit to Stand  - 1 x daily - 5-6 x weekly - 2 sets - 5-8 reps - Side Stepping with Counter Support  - 1 x daily - 7 x weekly - 2 sets - 5-10 reps  ASSESSMENT:  CLINICAL IMPRESSION: Pt is progressing well in PT and improving with function, balance, and strength.  Pt reports she is able to go up and down her daughter's stairs easier.  She continues to have balance deficits with posterior LOB though has improved based on subjective reports and visual observation.  She reports having difficulty descending a curb.  Pt reports a 40% / 30% improvement in her balance and loading/unloading the dishwasher respectively.  Pt demonstrates improved strength  in L ankle DF and bilat knee extension and hip abduction.  Pt continues to have balance deficits though is making progress.  Pt demonstrates improved TUG score and slightly worse 5x STS test score.  Pt demonstrates worse self perceived disability with ABC scale decreasing from 60% to 41%.  Pt has met STG's #1,3 and partially #2 and is progressing toward LTG's.  She should benefit from cont skilled PT to address ongoing goals,  improve balance and strength, reduce fall risk, and maximize functional mobility.     OBJECTIVE IMPAIRMENTS: Abnormal gait, decreased activity tolerance, decreased balance, decreased endurance, decreased mobility, difficulty walking, and decreased strength.   ACTIVITY LIMITATIONS: standing, squatting, stairs, transfers, and locomotion level  PARTICIPATION LIMITATIONS: cleaning and community activity  PERSONAL FACTORS: 3+ comorbidities: Arthritis, bilat TKA, peripheral neuropathy are also affecting patient's functional outcome.   REHAB POTENTIAL: Good  CLINICAL DECISION MAKING: Stable/uncomplicated  EVALUATION COMPLEXITY: Low   GOALS:   SHORT TERM GOALS: Target date: 06/05/2024  Pt will be independent and compliant with HEP for improved strength, balance, stability, and mobility.  Baseline: Goal status: GOAL MET 06/27/24  2.  Pt will demo improved time on TUG test by at least 5 seconds for improved mobility and reduced fall risk.   Baseline:  Goal status: 80% MET  3.  Pt will report at least a 25% improvement in her balance.   Baseline:  Goal status: GOAL MET  06/27/24    LONG TERM GOALS: Target date:  08/08/2024   Pt will score at least 44/56 on the Berg Balance Assessment for improved balance and stability with daily tasks and mobility.  Baseline:  35/56 Goal status: INITIAL--Pt completed Berg Balance Test     2.  Pt will be able to load and unload her dishwasher with good stability and good confidence.  Baseline:  Goal status: PROGRESSING  3.  Pt will demo improved bilat LE strength to 4+/5 in hip flexion, 5/5 in knee extension, 4-/5 in R ankle DF, and a 5# increase in L hip abduction for improved performance of functional mobility and performance of household chores.  Baseline:  Goal status: PROGRESSING  4.  Pt will be able to ascend and descend stairs with a reciprocal gait with the rail.   Baseline:  Goal status: ONGOING  5.  Pt will report she is able to  perform her normal ambulation with cane with good stability and confidence and without LOB. Baseline:  Goal status:ONGOING   PLAN:  PT FREQUENCY: 2x/week  PT DURATION: 6 weeks  PLANNED INTERVENTIONS: 02835- PT Re-evaluation, 97750- Physical Performance Testing, 97110-Therapeutic exercises, 97530- Therapeutic activity, [id number]- Neuromuscular re-education, 97535- Self Care, 02859- Manual therapy, [phone]- Gait training, [phone]- Aquatic Therapy, [phone]- Electrical stimulation (unattended), Patient/Family education, Balance training, Stair training, Taping, DME instructions, Cryotherapy, and Moist heat  PLAN FOR NEXT SESSION: Work on [employer].  LE strengthening and balance activities.  Work on step ups and step downs next visit.    Leigh Minerva III PT, DPT 06/28/24 8:27 AM

## 2024-07-01 ENCOUNTER — Encounter (HOSPITAL_BASED_OUTPATIENT_CLINIC_OR_DEPARTMENT_OTHER): Payer: Self-pay | Admitting: Physical Therapy

## 2024-07-01 ENCOUNTER — Ambulatory Visit (HOSPITAL_BASED_OUTPATIENT_CLINIC_OR_DEPARTMENT_OTHER): Admitting: Physical Therapy

## 2024-07-01 DIAGNOSIS — R2689 Other abnormalities of gait and mobility: Secondary | ICD-10-CM | POA: Diagnosis not present

## 2024-07-01 DIAGNOSIS — M6281 Muscle weakness (generalized): Secondary | ICD-10-CM

## 2024-07-01 DIAGNOSIS — R2681 Unsteadiness on feet: Secondary | ICD-10-CM

## 2024-07-01 NOTE — Therapy (Signed)
 OUTPATIENT PHYSICAL THERAPY LOWER EXTREMITY TREATMENT        Patient Name: Hailey Perkins MRN: [id number] DOB:01/01/1935, 89 y.o., female Today's Date: 07/02/2024  END OF SESSION:  PT End of Session - 07/01/24 1035     Visit Number 10    Date for PT Re-Evaluation 08/08/24    Authorization Type BCBS MCR    PT Start Time 1027    PT Stop Time 1111    PT Time Calculation (min) 44 min    Activity Tolerance Patient tolerated treatment well    Behavior During Therapy WFL for tasks assessed/performed            Past Medical History:  Diagnosis Date   Aortic atherosclerosis (HCC)    Arthritis    Cataract 2010   bilateral eyes   Chicken pox    Cholelithiasis    Diverticulitis    Diverticulosis    GERD (gastroesophageal reflux disease)    Glaucoma    Hiatal hernia    History of frequent urinary tract infections    Hyperlipidemia    Osteopenia    Peripheral neuropathy    Sleep apnea    wears a CPAP   Urine incontinence    Past Surgical History:  Procedure Laterality Date   ABDOMINAL HYSTERECTOMY  1977   ANAL FISTULECTOMY  1985   ANKLE FRACTURE SURGERY Right 2014   APPENDECTOMY     BIOPSY  02/15/2021   Procedure: BIOPSY;  Surgeon: Wilhelmenia Aloha Raddle., MD;  Location: [facility];  Service: Gastroenterology;;   BREAST BIOPSY Bilateral 10+ yrs ago   BENIGN   BUNIONECTOMY Left 1975   Left Toe   CATARACT EXTRACTION, BILATERAL  2009   COLONOSCOPY WITH PROPOFOL  N/A 02/15/2021   Procedure: COLONOSCOPY WITH PROPOFOL ;  Surgeon: Wilhelmenia Aloha Raddle., MD;  Location: [facility];  Service: Gastroenterology;  Laterality: N/A;  ultraslimscope    DILATION AND CURETTAGE OF UTERUS  1972   eye PRK vision correction Left 10/09/2013   eye socket re-formed Left 2014   FOOT NEUROMA SURGERY Right 2003   plus bunionectomy, right great toe   HAMMER TOE SURGERY Right 2005   Right foot correction hammertoe little toe   HEMORRHOID SURGERY  1967   HIP SURGERY Right 2014    right hip femur pin implanted   KNEE ARTHROSCOPY Left 2010   debridement left knee scar tissue   LAPAROSCOPIC OOPHERECTOMY     1986, 1990   LASIK  1998   NEUROMA SURGERY Right 2003   Right Foot Neuroma, plus Bunionectomy, Right Great Toe   POLYPECTOMY  02/15/2021   Procedure: POLYPECTOMY;  Surgeon: Wilhelmenia Aloha Raddle., MD;  Location: [facility];  Service: Gastroenterology;;   RECTOCELE REPAIR  1968   REPLACEMENT TOTAL KNEE Left 01/2008   REPLACEMENT TOTAL KNEE Right 08/2008   retinal pucker correction Left 07/14/2012   schwannoma tumor (benign) removal  2011   right rib   SIGMOIDOSCOPY     TONSILLECTOMY AND ADENOIDECTOMY     VENTRAL HERNIA REPAIR  1993   Patient Active Problem List   Diagnosis Date Noted   Arthralgia of right elbow 08/24/2022   Bilateral wrist pain 08/24/2022   Neck pain 08/24/2022   Pain of right hip joint 08/24/2022   Chronic pain of right knee 05/27/2021   OSA on CPAP 05/27/2021   LLQ pain 12/16/2020   History of diverticulitis 12/16/2020   Hematochezia 12/16/2020   Abnormal colonoscopy 12/16/2020   Acquired trigger finger of right index finger 10/26/2020  Other specified arthritis, right hand 10/26/2020   Diverticulitis of colon 08/15/2020   Dry eyes, bilateral 10/10/2019   Sleep-related hypoxia 08/05/2019   OSA (obstructive sleep apnea) 08/05/2019   PLMD (periodic limb movement disorder) 08/05/2019   Cerebellar 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) 05/06/2019   Excessive daytime sleepiness 05/06/2019   Loud snoring 05/06/2019   Ventral hernia without obstruction or gangrene 02/22/2019   Chronic throat clearing 01/18/2019   Nocturnal cough 01/18/2019   Family history of colon cancer in father 01/18/2019   Diverticulosis 01/18/2019   Gait abnormality 12/27/2018   Numbness 12/27/2018   Status post total bilateral knee replacement 11/30/2018   DDD (degenerative disc disease), cervical 11/30/2018   Status post carpal tunnel release  11/30/2018   DDD (degenerative disc disease), lumbar 10/26/2018   Primary osteoarthritis of both hands 10/26/2018   Primary osteoarthritis of both feet 10/26/2018   Gastroesophageal reflux disease 08/27/2018   Bilateral impacted cerumen 08/21/2018   Sensorineural hearing loss (SNHL) of both ears 08/21/2018   Tinnitus, bilateral 08/21/2018   Blood in urine 04/17/2018   Secondary open-angle glaucoma of left eye, moderate stage 03/10/2018   Early stage 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 10/05/2017   Epiretinal membrane (ERM) of left eye 10/05/2017   Pseudophakia of both eyes 10/05/2017   Osteopenia 03/19/2017   Mixed hyperlipidemia 08/18/2016   Generalized osteoarthritis of multiple sites 08/18/2016   Peripheral neuropathic pain 08/18/2016   Overactive bladder 08/18/2016   Glaucoma 08/18/2016   Cervical disc disorder with radiculopathy of cervical region 08/17/2016   Carpal tunnel syndrome, right upper limb 08/17/2016   Abnormal finding on mammography 06/14/2016   Breast lump 05/06/2015    PCP: Yolande Toribio MATSU, MD  REFERRING PROVIDER: Yolande Toribio MATSU, MD  REFERRING DIAG: R26.81 (ICD-10-CM) - Unsteadiness on feet/Unsteady gait  THERAPY DIAG:  Other abnormalities of gait and mobility  Unsteadiness on feet  Muscle weakness (generalized)  Rationale for Evaluation and Treatment: Rehabilitation  ONSET DATE: 2021 ; PT order 04/04/2024  SUBJECTIVE:   SUBJECTIVE STATEMENT: Pt states she has stiffness in the AM especially in R hip.   Pt denies any adverse effects after prior Rx.  Pt states she walked slowly on the TM for 30 mins after prior Rx.  Pt went to her aquatic class at 7:30 AM this morning.    Pt has been performing aquatic class 3x/wk.    PERTINENT HISTORY: Pt uses a SPC with wlaking.  Flexor tenotomy on 3 toes of R foot on 05/01/24--Pt reports she has no restrictions from MD. Arthritis, osteopenia, peripheral neuropathy Bilat  TKR  PAIN:  Pt states she doesn't really have any pain, just stiffness in AM.   PRECAUTIONS: Fall and Other: peripheral neuropathy   WEIGHT BEARING RESTRICTIONS: No  FALLS:  Has patient fallen in last 6 months? No  LIVING ENVIRONMENT: Lives with: lives alone Lives in:  1 story home Stairs: No Has following equipment at home: Single point cane, shower chair, and Grab bars  OCCUPATION:  Retired  PLOF: Independent  PATIENT GOALS: improved ability to pivot.  Improved walking on uneven terrain and performing stairs   OBJECTIVE:  Note: Objective measures were completed at Evaluation unless otherwise noted.  DIAGNOSTIC FINDINGS: N/A  TREATMENT DATE:    06/27/2024:  Nustep L4-5 UE/LEs x 6 mins  Pt brought in a band and had questions concerning an exercise.  Pt wanted to try an exercise at home and demonstrated exercise.   Pt performed hip flexion with yellow band around feet in standing holding onto rail. PT answered pt's questions and instruction pt in proper safety with positioning and set up. Ankle DF with RTB 2x10 on L, 1x10 each with YTB and RTB on R Seated marching with GTB 3x10 Step ups on 1st step with UE support on rail 2x5 reps each LE Step downs 4 inch step x 10 each with rail  Neuro Re-ed Activities: Ambulated bwds with SBA with UE support on rail x 3 laps Ambulated around cones with CGA Sidestepping around cones with pivoting with min assist   FUNCTIONAL TESTS:  5x STS test:  Initial/Current:  15.35 without UE support / 16.17 sec without UE support TUG:  Initial/Current:  17.84 sec with SPC  / 13.52 sec with SPC  LOWER EXTREMITY MMT:   MMT Right eval Left eval Right 7/24 Left 7/24  Hip flexion 4/5 4/5 4/5 4/5  Hip extension        Hip abduction 24.6 19.1 25.4 25.8  Hip adduction        Hip internal rotation        Hip  external rotation        Knee flexion 5/5 seated 5/5 seated    Knee extension 4-/5 4-/5 4/5 4/5  Ankle dorsiflexion Tol min resistance 4/5 Tol min resistance 4+/5  Ankle plantarflexion        Ankle inversion        Ankle eversion         (Blank rows = not tested)  PATIENT SURVEYS:  ABC scale:  60%  /  41%   PATIENT EDUCATION:  Education details:  HEP, rationale of interventions, POC, dx, exercise form, and relevant anatomy.  PT answered pt's questions.   Person educated: Patient Education method: Explanation, demonstration, verbal cues Education comprehension: verbalized understanding and needs further education, verbal cues required, returned demonstration  HOME EXERCISE PROGRAM: Access Code: [email or web address] URL: [email or web address] Date: 05/21/2024 Prepared by: Mose Minerva  Exercises - Seated March  - 1 x daily - 7 x weekly - 2 sets - 10 reps - Seated Ankle Dorsiflexion AROM  - 1 x daily - 7 x weekly - 2 sets - 10 reps - Sit to Stand  - 1 x daily - 5-6 x weekly - 2 sets - 5-8 reps - Side Stepping with Counter Support  - 1 x daily - 7 x weekly - 2 sets - 5-10 reps  ASSESSMENT:  CLINICAL IMPRESSION: Pt is progressing well in PT and improving with function, balance, and strength.  She brought in a band and had questions concerning an exercise that she had seen.  PT answered her questions and instructed her in proper set up and safety with performance and set up.  PT worked on Camera operator.  Pt performed step ups on the 1st step today and requires UE support to perform.  Pt requires min assist for balance and cuing for pivoting/turning with sidestepping around cones.  Pt responded well to Rx and had no c/o's after Rx.  She should benefit from cont skilled PT to address ongoing goals, improve balance and strength, reduce fall risk, and maximize functional mobility.     OBJECTIVE IMPAIRMENTS: Abnormal gait, decreased activity tolerance, decreased balance,  decreased endurance, decreased mobility, difficulty walking, and decreased strength.   ACTIVITY LIMITATIONS: standing, squatting, stairs, transfers, and locomotion level  PARTICIPATION LIMITATIONS: cleaning and community activity  PERSONAL FACTORS: 3+ comorbidities: Arthritis, bilat TKA, peripheral neuropathy are also affecting patient's functional outcome.   REHAB POTENTIAL: Good  CLINICAL DECISION MAKING: Stable/uncomplicated  EVALUATION COMPLEXITY: Low   GOALS:   SHORT TERM GOALS: Target date: 06/05/2024  Pt will be independent and compliant with HEP for improved strength, balance, stability, and mobility.  Baseline: Goal status: GOAL MET 06/27/24  2.  Pt will demo improved time on TUG test by at least 5 seconds for improved mobility and reduced fall risk.   Baseline:  Goal status: 80% MET  3.  Pt will report at least a 25% improvement in her balance.   Baseline:  Goal status: GOAL MET  06/27/24    LONG TERM GOALS: Target date:  08/08/2024   Pt will score at least 44/56 on the Berg Balance Assessment for improved balance and stability with daily tasks and mobility.  Baseline:  35/56 Goal status: INITIAL--Pt completed Berg Balance Test     2.  Pt will be able to load and unload her dishwasher with good stability and good confidence.  Baseline:  Goal status: PROGRESSING  3.  Pt will demo improved bilat LE strength to 4+/5 in hip flexion, 5/5 in knee extension, 4-/5 in R ankle DF, and a 5# increase in L hip abduction for improved performance of functional mobility and performance of household chores.  Baseline:  Goal status: PROGRESSING  4.  Pt will be able to ascend and descend stairs with a reciprocal gait with the rail.   Baseline:  Goal status: ONGOING  5.  Pt will report she is able to perform her normal ambulation with cane with good stability and confidence and without LOB. Baseline:  Goal status:ONGOING   PLAN:  PT FREQUENCY: 2x/week  PT DURATION: 6  weeks  PLANNED INTERVENTIONS: 02835- PT Re-evaluation, 97750- Physical Performance Testing, 97110-Therapeutic exercises, 97530- Therapeutic activity, [id number]- Neuromuscular re-education, 97535- Self Care, 02859- Manual therapy, [phone]- Gait training, [phone]- Aquatic Therapy, [phone]- Electrical stimulation (unattended), Patient/Family education, Balance training, Stair training, Taping, DME instructions, Cryotherapy, and Moist heat  PLAN FOR NEXT SESSION: Work on [employer].  LE strengthening and balance activities.  Work on step ups and step downs next visit.    Leigh Minerva III PT, DPT 07/02/24 10:15 PM

## 2024-07-04 ENCOUNTER — Ambulatory Visit (HOSPITAL_BASED_OUTPATIENT_CLINIC_OR_DEPARTMENT_OTHER): Admitting: Physical Therapy

## 2024-07-04 ENCOUNTER — Encounter (HOSPITAL_BASED_OUTPATIENT_CLINIC_OR_DEPARTMENT_OTHER): Payer: Self-pay | Admitting: Physical Therapy

## 2024-07-04 DIAGNOSIS — R2681 Unsteadiness on feet: Secondary | ICD-10-CM

## 2024-07-04 DIAGNOSIS — R2689 Other abnormalities of gait and mobility: Secondary | ICD-10-CM

## 2024-07-04 DIAGNOSIS — M6281 Muscle weakness (generalized): Secondary | ICD-10-CM

## 2024-07-04 NOTE — Therapy (Signed)
 OUTPATIENT PHYSICAL THERAPY LOWER EXTREMITY TREATMENT        Patient Name: Hailey Perkins MRN: [id number] DOB:03/06/1935, 89 y.o., female Today's Date: 07/04/2024  END OF SESSION:  PT End of Session - 07/04/24 1039     Visit Number 11    Number of Visits 21    Date for PT Re-Evaluation 08/08/24    Authorization Type BCBS MCR    PT Start Time 1027    PT Stop Time 1107    PT Time Calculation (min) 40 min    Activity Tolerance Patient tolerated treatment well    Behavior During Therapy WFL for tasks assessed/performed            Past Medical History:  Diagnosis Date   Aortic atherosclerosis (HCC)    Arthritis    Cataract 2010   bilateral eyes   Chicken pox    Cholelithiasis    Diverticulitis    Diverticulosis    GERD (gastroesophageal reflux disease)    Glaucoma    Hiatal hernia    History of frequent urinary tract infections    Hyperlipidemia    Osteopenia    Peripheral neuropathy    Sleep apnea    wears a CPAP   Urine incontinence    Past Surgical History:  Procedure Laterality Date   ABDOMINAL HYSTERECTOMY  1977   ANAL FISTULECTOMY  1985   ANKLE FRACTURE SURGERY Right 2014   APPENDECTOMY     BIOPSY  02/15/2021   Procedure: BIOPSY;  Surgeon: Wilhelmenia Aloha Raddle., MD;  Location: [facility];  Service: Gastroenterology;;   BREAST BIOPSY Bilateral 10+ yrs ago   BENIGN   BUNIONECTOMY Left 1975   Left Toe   CATARACT EXTRACTION, BILATERAL  2009   COLONOSCOPY WITH PROPOFOL  N/A 02/15/2021   Procedure: COLONOSCOPY WITH PROPOFOL ;  Surgeon: Wilhelmenia Aloha Raddle., MD;  Location: [facility];  Service: Gastroenterology;  Laterality: N/A;  ultraslimscope    DILATION AND CURETTAGE OF UTERUS  1972   eye PRK vision correction Left 10/09/2013   eye socket re-formed Left 2014   FOOT NEUROMA SURGERY Right 2003   plus bunionectomy, right great toe   HAMMER TOE SURGERY Right 2005   Right foot correction hammertoe little toe   HEMORRHOID SURGERY  1967   HIP  SURGERY Right 2014   right hip femur pin implanted   KNEE ARTHROSCOPY Left 2010   debridement left knee scar tissue   LAPAROSCOPIC OOPHERECTOMY     1986, 1990   LASIK  1998   NEUROMA SURGERY Right 2003   Right Foot Neuroma, plus Bunionectomy, Right Great Toe   POLYPECTOMY  02/15/2021   Procedure: POLYPECTOMY;  Surgeon: Wilhelmenia Aloha Raddle., MD;  Location: [facility];  Service: Gastroenterology;;   RECTOCELE REPAIR  1968   REPLACEMENT TOTAL KNEE Left 01/2008   REPLACEMENT TOTAL KNEE Right 08/2008   retinal pucker correction Left 07/14/2012   schwannoma tumor (benign) removal  2011   right rib   SIGMOIDOSCOPY     TONSILLECTOMY AND ADENOIDECTOMY     VENTRAL HERNIA REPAIR  1993   Patient Active Problem List   Diagnosis Date Noted   Arthralgia of right elbow 08/24/2022   Bilateral wrist pain 08/24/2022   Neck pain 08/24/2022   Pain of right hip joint 08/24/2022   Chronic pain of right knee 05/27/2021   OSA on CPAP 05/27/2021   LLQ pain 12/16/2020   History of diverticulitis 12/16/2020   Hematochezia 12/16/2020   Abnormal colonoscopy 12/16/2020   Acquired  trigger finger of right index finger 10/26/2020   Other specified arthritis, right hand 10/26/2020   Diverticulitis of colon 08/15/2020   Dry eyes, bilateral 10/10/2019   Sleep-related hypoxia 08/05/2019   OSA (obstructive sleep apnea) 08/05/2019   PLMD (periodic limb movement disorder) 08/05/2019   Cerebellar 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) 05/06/2019   Excessive daytime sleepiness 05/06/2019   Loud snoring 05/06/2019   Ventral hernia without obstruction or gangrene 02/22/2019   Chronic throat clearing 01/18/2019   Nocturnal cough 01/18/2019   Family history of colon cancer in father 01/18/2019   Diverticulosis 01/18/2019   Gait abnormality 12/27/2018   Numbness 12/27/2018   Status post total bilateral knee replacement 11/30/2018   DDD (degenerative disc disease), cervical 11/30/2018   Status post  carpal tunnel release 11/30/2018   DDD (degenerative disc disease), lumbar 10/26/2018   Primary osteoarthritis of both hands 10/26/2018   Primary osteoarthritis of both feet 10/26/2018   Gastroesophageal reflux disease 08/27/2018   Bilateral impacted cerumen 08/21/2018   Sensorineural hearing loss (SNHL) of both ears 08/21/2018   Tinnitus, bilateral 08/21/2018   Blood in urine 04/17/2018   Secondary open-angle glaucoma of left eye, moderate stage 03/10/2018   Early stage 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 10/05/2017   Epiretinal membrane (ERM) of left eye 10/05/2017   Pseudophakia of both eyes 10/05/2017   Osteopenia 03/19/2017   Mixed hyperlipidemia 08/18/2016   Generalized osteoarthritis of multiple sites 08/18/2016   Peripheral neuropathic pain 08/18/2016   Overactive bladder 08/18/2016   Glaucoma 08/18/2016   Cervical disc disorder with radiculopathy of cervical region 08/17/2016   Carpal tunnel syndrome, right upper limb 08/17/2016   Abnormal finding on mammography 06/14/2016   Breast lump 05/06/2015    PCP: Yolande Toribio MATSU, MD  REFERRING PROVIDER: Yolande Toribio MATSU, MD  REFERRING DIAG: R26.81 (ICD-10-CM) - Unsteadiness on feet/Unsteady gait  THERAPY DIAG:  Other abnormalities of gait and mobility  Unsteadiness on feet  Muscle weakness (generalized)  Rationale for Evaluation and Treatment: Rehabilitation  ONSET DATE: 2021 ; PT order 04/04/2024  SUBJECTIVE:   SUBJECTIVE STATEMENT: Pt states she gets her electric scooter next week.   Pt denies any adverse effects after prior Rx.  Pt states she decided not to perform that standing hip flexion exercises with band at home.    Pt has been performing aquatic class 3x/wk.    PERTINENT HISTORY: Pt uses a SPC with wlaking.  Flexor tenotomy on 3 toes of R foot on 05/01/24--Pt reports she has no restrictions from MD. Arthritis, osteopenia, peripheral neuropathy Bilat TKR  PAIN:  Not  really any pain  PRECAUTIONS: Fall and Other: peripheral neuropathy   WEIGHT BEARING RESTRICTIONS: No  FALLS:  Has patient fallen in last 6 months? No  LIVING ENVIRONMENT: Lives with: lives alone Lives in:  1 story home Stairs: No Has following equipment at home: Single point cane, shower chair, and Grab bars  OCCUPATION:  Retired  PLOF: Independent  PATIENT GOALS: improved ability to pivot.  Improved walking on uneven terrain and performing stairs   OBJECTIVE:  Note: Objective measures were completed at Evaluation unless otherwise noted.  DIAGNOSTIC FINDINGS: N/A  TREATMENT DATE:    07/04/2024:  Nustep L5 UE/LEs x 6 mins Ankle DF with RTB 2x10 on L, 1x10 each with YTB and RTB on R Seated marching with GTB 3x10 Sit to stands with GTB around thighs x 10, 8 reps   Neuro Re-ed Activities: F/B and S/S sways on airex 2x10 with 1-2 occasions of UE support with SBA Marching on airex with UE support 2x10 Ambulated bwds with SBA with UE support on rail x 3 laps Ambulated over 4 hurdles with step to and reciprocal gait with SBA with rail support Lateral stepping over 1 hurdle with UE support with SBA    PATIENT EDUCATION:  Education details:  HEP, rationale of interventions, POC, dx, exercise form, and relevant anatomy.  PT answered pt's questions.   Person educated: Patient Education method: Explanation, demonstration, verbal cues Education comprehension: verbalized understanding and needs further education, verbal cues required, returned demonstration  HOME EXERCISE PROGRAM: Access Code: [email or web address] URL: [email or web address] Date: 05/21/2024 Prepared by: Mose Minerva  Exercises - Seated March  - 1 x daily - 7 x weekly - 2 sets - 10 reps - Seated Ankle Dorsiflexion AROM  - 1 x daily - 7 x weekly - 2 sets - 10 reps - Sit to  Stand  - 1 x daily - 5-6 x weekly - 2 sets - 5-8 reps - Side Stepping with Counter Support  - 1 x daily - 7 x weekly - 2 sets - 5-10 reps  ASSESSMENT:  CLINICAL IMPRESSION: Pt is progressing well in PT and improving with function, balance, and strength.  Pt gives good effort with all exercises and activities.  She has improved balance with F/B and S/S swaying as evidenced by performance on airex in clinic today with decreased UE support.  Pt was fatigued with sit to stands with GTB and unable to complete the 2nd set of 10.  Pt did well stepping over hurdles though requires UE support. Pt responded well to Rx and had no c/o's after Rx.  She should benefit from cont skilled PT to address ongoing goals, improve balance and strength, reduce fall risk, and maximize functional mobility.       OBJECTIVE IMPAIRMENTS: Abnormal gait, decreased activity tolerance, decreased balance, decreased endurance, decreased mobility, difficulty walking, and decreased strength.   ACTIVITY LIMITATIONS: standing, squatting, stairs, transfers, and locomotion level  PARTICIPATION LIMITATIONS: cleaning and community activity  PERSONAL FACTORS: 3+ comorbidities: Arthritis, bilat TKA, peripheral neuropathy are also affecting patient's functional outcome.   REHAB POTENTIAL: Good  CLINICAL DECISION MAKING: Stable/uncomplicated  EVALUATION COMPLEXITY: Low   GOALS:   SHORT TERM GOALS: Target date: 06/05/2024  Pt will be independent and compliant with HEP for improved strength, balance, stability, and mobility.  Baseline: Goal status: GOAL MET 06/27/24  2.  Pt will demo improved time on TUG test by at least 5 seconds for improved mobility and reduced fall risk.   Baseline:  Goal status: 80% MET  3.  Pt will report at least a 25% improvement in her balance.   Baseline:  Goal status: GOAL MET  06/27/24    LONG TERM GOALS: Target date:  08/08/2024   Pt will score at least 44/56 on the Berg Balance Assessment  for improved balance and stability with daily tasks and mobility.  Baseline:  35/56 Goal status: INITIAL--Pt completed Berg Balance Test     2.  Pt will be able to load and unload her dishwasher with good stability and good confidence.  Baseline:  Goal  status: PROGRESSING  3.  Pt will demo improved bilat LE strength to 4+/5 in hip flexion, 5/5 in knee extension, 4-/5 in R ankle DF, and a 5# increase in L hip abduction for improved performance of functional mobility and performance of household chores.  Baseline:  Goal status: PROGRESSING  4.  Pt will be able to ascend and descend stairs with a reciprocal gait with the rail.   Baseline:  Goal status: ONGOING  5.  Pt will report she is able to perform her normal ambulation with cane with good stability and confidence and without LOB. Baseline:  Goal status:ONGOING   PLAN:  PT FREQUENCY: 2x/week  PT DURATION: 6 weeks  PLANNED INTERVENTIONS: 02835- PT Re-evaluation, 97750- Physical Performance Testing, 97110-Therapeutic exercises, 97530- Therapeutic activity, [id number]- Neuromuscular re-education, 97535- Self Care, 02859- Manual therapy, [phone]- Gait training, [phone]- Aquatic Therapy, [phone]- Electrical stimulation (unattended), Patient/Family education, Balance training, Stair training, Taping, DME instructions, Cryotherapy, and Moist heat  PLAN FOR NEXT SESSION: Work on [employer].  LE strengthening and balance activities.  Work on step ups and step downs next visit.    Leigh Minerva III PT, DPT 07/04/24 11:38 PM

## 2024-07-10 ENCOUNTER — Ambulatory Visit (HOSPITAL_BASED_OUTPATIENT_CLINIC_OR_DEPARTMENT_OTHER): Payer: Self-pay | Attending: Internal Medicine | Admitting: Physical Therapy

## 2024-07-10 ENCOUNTER — Encounter (HOSPITAL_BASED_OUTPATIENT_CLINIC_OR_DEPARTMENT_OTHER): Payer: Self-pay | Admitting: Physical Therapy

## 2024-07-10 DIAGNOSIS — M6281 Muscle weakness (generalized): Secondary | ICD-10-CM | POA: Insufficient documentation

## 2024-07-10 DIAGNOSIS — R2681 Unsteadiness on feet: Secondary | ICD-10-CM | POA: Insufficient documentation

## 2024-07-10 DIAGNOSIS — R2689 Other abnormalities of gait and mobility: Secondary | ICD-10-CM | POA: Diagnosis present

## 2024-07-10 NOTE — Therapy (Signed)
 OUTPATIENT PHYSICAL THERAPY LOWER EXTREMITY TREATMENT        Patient Name: Hailey Perkins MRN: [id number] DOB:1935/08/13, 89 y.o., female Today's Date: 07/11/2024  END OF SESSION:  PT End of Session - 07/10/24 0811     Visit Number 12    Number of Visits 21    Date for PT Re-Evaluation 08/08/24    Authorization Type BCBS MCR    PT Start Time 0805    PT Stop Time 0850    PT Time Calculation (min) 45 min    Activity Tolerance Patient tolerated treatment well    Behavior During Therapy [facility] for tasks assessed/performed            Past Medical History:  Diagnosis Date   Aortic atherosclerosis (HCC)    Arthritis    Cataract 2010   bilateral eyes   Chicken pox    Cholelithiasis    Diverticulitis    Diverticulosis    GERD (gastroesophageal reflux disease)    Glaucoma    Hiatal hernia    History of frequent urinary tract infections    Hyperlipidemia    Osteopenia    Peripheral neuropathy    Sleep apnea    wears a CPAP   Urine incontinence    Past Surgical History:  Procedure Laterality Date   ABDOMINAL HYSTERECTOMY  1977   ANAL FISTULECTOMY  1985   ANKLE FRACTURE SURGERY Right 2014   APPENDECTOMY     BIOPSY  02/15/2021   Procedure: BIOPSY;  Surgeon: Wilhelmenia Aloha Raddle., MD;  Location: [facility];  Service: Gastroenterology;;   BREAST BIOPSY Bilateral 10+ yrs ago   BENIGN   BUNIONECTOMY Left 1975   Left Toe   CATARACT EXTRACTION, BILATERAL  2009   COLONOSCOPY WITH PROPOFOL  N/A 02/15/2021   Procedure: COLONOSCOPY WITH PROPOFOL ;  Surgeon: Wilhelmenia Aloha Raddle., MD;  Location: [facility];  Service: Gastroenterology;  Laterality: N/A;  ultraslimscope    DILATION AND CURETTAGE OF UTERUS  1972   eye PRK vision correction Left 10/09/2013   eye socket re-formed Left 2014   FOOT NEUROMA SURGERY Right 2003   plus bunionectomy, right great toe   HAMMER TOE SURGERY Right 2005   Right foot correction hammertoe little toe   HEMORRHOID SURGERY  1967   HIP  SURGERY Right 2014   right hip femur pin implanted   KNEE ARTHROSCOPY Left 2010   debridement left knee scar tissue   LAPAROSCOPIC OOPHERECTOMY     1986, 1990   LASIK  1998   NEUROMA SURGERY Right 2003   Right Foot Neuroma, plus Bunionectomy, Right Great Toe   POLYPECTOMY  02/15/2021   Procedure: POLYPECTOMY;  Surgeon: Wilhelmenia Aloha Raddle., MD;  Location: [facility];  Service: Gastroenterology;;   RECTOCELE REPAIR  1968   REPLACEMENT TOTAL KNEE Left 01/2008   REPLACEMENT TOTAL KNEE Right 08/2008   retinal pucker correction Left 07/14/2012   schwannoma tumor (benign) removal  2011   right rib   SIGMOIDOSCOPY     TONSILLECTOMY AND ADENOIDECTOMY     VENTRAL HERNIA REPAIR  1993   Patient Active Problem List   Diagnosis Date Noted   Arthralgia of right elbow 08/24/2022   Bilateral wrist pain 08/24/2022   Neck pain 08/24/2022   Pain of right hip joint 08/24/2022   Chronic pain of right knee 05/27/2021   OSA on CPAP 05/27/2021   LLQ pain 12/16/2020   History of diverticulitis 12/16/2020   Hematochezia 12/16/2020   Abnormal colonoscopy 12/16/2020   Acquired  trigger finger of right index finger 10/26/2020   Other specified arthritis, right hand 10/26/2020   Diverticulitis of colon 08/15/2020   Dry eyes, bilateral 10/10/2019   Sleep-related hypoxia 08/05/2019   OSA (obstructive sleep apnea) 08/05/2019   PLMD (periodic limb movement disorder) 08/05/2019   Cerebellar 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) 05/06/2019   Excessive daytime sleepiness 05/06/2019   Loud snoring 05/06/2019   Ventral hernia without obstruction or gangrene 02/22/2019   Chronic throat clearing 01/18/2019   Nocturnal cough 01/18/2019   Family history of colon cancer in father 01/18/2019   Diverticulosis 01/18/2019   Gait abnormality 12/27/2018   Numbness 12/27/2018   Status post total bilateral knee replacement 11/30/2018   DDD (degenerative disc disease), cervical 11/30/2018   Status post  carpal tunnel release 11/30/2018   DDD (degenerative disc disease), lumbar 10/26/2018   Primary osteoarthritis of both hands 10/26/2018   Primary osteoarthritis of both feet 10/26/2018   Gastroesophageal reflux disease 08/27/2018   Bilateral impacted cerumen 08/21/2018   Sensorineural hearing loss (SNHL) of both ears 08/21/2018   Tinnitus, bilateral 08/21/2018   Blood in urine 04/17/2018   Secondary open-angle glaucoma of left eye, moderate stage 03/10/2018   Early stage 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 10/05/2017   Epiretinal membrane (ERM) of left eye 10/05/2017   Pseudophakia of both eyes 10/05/2017   Osteopenia 03/19/2017   Mixed hyperlipidemia 08/18/2016   Generalized osteoarthritis of multiple sites 08/18/2016   Peripheral neuropathic pain 08/18/2016   Overactive bladder 08/18/2016   Glaucoma 08/18/2016   Cervical disc disorder with radiculopathy of cervical region 08/17/2016   Carpal tunnel syndrome, right upper limb 08/17/2016   Abnormal finding on mammography 06/14/2016   Breast lump 05/06/2015    PCP: Yolande Toribio MATSU, MD  REFERRING PROVIDER: Yolande Toribio MATSU, MD  REFERRING DIAG: R26.81 (ICD-10-CM) - Unsteadiness on feet/Unsteady gait  THERAPY DIAG:  Other abnormalities of gait and mobility  Unsteadiness on feet  Muscle weakness (generalized)  Rationale for Evaluation and Treatment: Rehabilitation  ONSET DATE: 2021 ; PT order 04/04/2024  SUBJECTIVE:   SUBJECTIVE STATEMENT: Pt picked up her electric scooter yesterday.  Pt denies any adverse effects after prior Rx.  Pt saw MD and let her know how tired she has felt recently.  MD performed blood work and is checking her iron levels.    Pt has been performing aquatic class 3x/wk.    PERTINENT HISTORY: Pt uses a SPC with wlaking.  Flexor tenotomy on 3 toes of R foot on 05/01/24--Pt reports she has no restrictions from MD. Arthritis, osteopenia, peripheral neuropathy Bilat  TKR  PAIN:  Not really any pain  PRECAUTIONS: Fall and Other: peripheral neuropathy   WEIGHT BEARING RESTRICTIONS: No  FALLS:  Has patient fallen in last 6 months? No  LIVING ENVIRONMENT: Lives with: lives alone Lives in:  1 story home Stairs: No Has following equipment at home: Single point cane, shower chair, and Grab bars  OCCUPATION:  Retired  PLOF: Independent  PATIENT GOALS: improved ability to pivot.  Improved walking on uneven terrain and performing stairs   OBJECTIVE:  Note: Objective measures were completed at Evaluation unless otherwise noted.  DIAGNOSTIC FINDINGS: N/A  TREATMENT DATE:    07/10/2024:  Nustep L5 UE/LEs x 5 mins Seated marching with GTB 3x10 LAQ with GTB 2x10 bilat Ankle DF with RTB 3x10 bilat Sit to stands with GTB around thighs 3x5 reps   Neuro Re-ed Activities: F/B and S/S sways on airex 2x10 with 1-2 occasions of UE support with SBA Marching on airex with UE support 2x10 Ambulated bwds with SBA with UE support on rail x 3 laps Ambulated over 4 hurdles with step to and reciprocal gait with SBA with rail support Lateral stepping over 2 hurdles with UE support with SBA    PATIENT EDUCATION:  Education details:  HEP, rationale of interventions, POC, dx, exercise form, and relevant anatomy.  PT answered pt's questions.   Person educated: Patient Education method: Explanation, demonstration, verbal cues Education comprehension: verbalized understanding and needs further education, verbal cues required, returned demonstration  HOME EXERCISE PROGRAM: Access Code: [email or web address] URL: [email or web address] Date: 05/21/2024 Prepared by: Mose Minerva  Exercises - Seated March  - 1 x daily - 7 x weekly - 2 sets - 10 reps - Seated Ankle Dorsiflexion AROM  - 1 x daily - 7 x weekly - 2 sets - 10 reps -  Sit to Stand  - 1 x daily - 5-6 x weekly - 2 sets - 5-8 reps - Side Stepping with Counter Support  - 1 x daily - 7 x weekly - 2 sets - 5-10 reps  ASSESSMENT:  CLINICAL IMPRESSION: Pt performed therapeutic exercises and neuro re-ed activities well.  She has improved balance with F/B and S/S swaying requiring decreased UE support with swaying on airex.  Pt performed sit to stands with GTB around thighs and PT decreased reps today.  Pt performed dynamic balance exercises well with UE support on rail.  Pt responded well to Rx stating she felt fine after Rx and felt worked out.  She should benefit from cont skilled PT to address ongoing goals, improve balance and strength, reduce fall risk, and maximize functional mobility.      OBJECTIVE IMPAIRMENTS: Abnormal gait, decreased activity tolerance, decreased balance, decreased endurance, decreased mobility, difficulty walking, and decreased strength.   ACTIVITY LIMITATIONS: standing, squatting, stairs, transfers, and locomotion level  PARTICIPATION LIMITATIONS: cleaning and community activity  PERSONAL FACTORS: 3+ comorbidities: Arthritis, bilat TKA, peripheral neuropathy are also affecting patient's functional outcome.   REHAB POTENTIAL: Good  CLINICAL DECISION MAKING: Stable/uncomplicated  EVALUATION COMPLEXITY: Low   GOALS:   SHORT TERM GOALS: Target date: 06/05/2024  Pt will be independent and compliant with HEP for improved strength, balance, stability, and mobility.  Baseline: Goal status: GOAL MET 06/27/24  2.  Pt will demo improved time on TUG test by at least 5 seconds for improved mobility and reduced fall risk.   Baseline:  Goal status: 80% MET  3.  Pt will report at least a 25% improvement in her balance.   Baseline:  Goal status: GOAL MET  06/27/24    LONG TERM GOALS: Target date:  08/08/2024   Pt will score at least 44/56 on the Berg Balance Assessment for improved balance and stability with daily tasks and mobility.   Baseline:  35/56 Goal status: INITIAL--Pt completed Berg Balance Test     2.  Pt will be able to load and unload her dishwasher with good stability and good confidence.  Baseline:  Goal status: PROGRESSING  3.  Pt will demo improved bilat LE strength to 4+/5 in hip flexion, 5/5 in knee extension, 4-/5 in R  ankle DF, and a 5# increase in L hip abduction for improved performance of functional mobility and performance of household chores.  Baseline:  Goal status: PROGRESSING  4.  Pt will be able to ascend and descend stairs with a reciprocal gait with the rail.   Baseline:  Goal status: ONGOING  5.  Pt will report she is able to perform her normal ambulation with cane with good stability and confidence and without LOB. Baseline:  Goal status:ONGOING   PLAN:  PT FREQUENCY: 2x/week  PT DURATION: 6 weeks  PLANNED INTERVENTIONS: 02835- PT Re-evaluation, 97750- Physical Performance Testing, 97110-Therapeutic exercises, 97530- Therapeutic activity, [id number]- Neuromuscular re-education, 97535- Self Care, 02859- Manual therapy, [phone]- Gait training, [phone]- Aquatic Therapy, [phone]- Electrical stimulation (unattended), Patient/Family education, Balance training, Stair training, Taping, DME instructions, Cryotherapy, and Moist heat  PLAN FOR NEXT SESSION: Work on [employer].  LE strengthening and balance activities.  Work on step ups and step downs next visit.    Leigh Minerva III PT, DPT 07/11/24 10:40 PM

## 2024-07-17 ENCOUNTER — Ambulatory Visit (HOSPITAL_BASED_OUTPATIENT_CLINIC_OR_DEPARTMENT_OTHER): Admitting: Physical Therapy

## 2024-07-17 ENCOUNTER — Encounter (HOSPITAL_BASED_OUTPATIENT_CLINIC_OR_DEPARTMENT_OTHER): Payer: Self-pay | Admitting: Physical Therapy

## 2024-07-17 DIAGNOSIS — R2689 Other abnormalities of gait and mobility: Secondary | ICD-10-CM

## 2024-07-17 DIAGNOSIS — R2681 Unsteadiness on feet: Secondary | ICD-10-CM

## 2024-07-17 DIAGNOSIS — M6281 Muscle weakness (generalized): Secondary | ICD-10-CM

## 2024-07-17 NOTE — Therapy (Signed)
 OUTPATIENT PHYSICAL THERAPY LOWER EXTREMITY TREATMENT        Patient Name: Hailey Perkins MRN: [id number] DOB:[birth year], 89 y.o., female Today's Date: 07/18/2024  END OF SESSION:  PT End of Session - 07/17/24 0903     Visit Number 13    Number of Visits 21    Date for PT Re-Evaluation 08/08/24    Authorization Type BCBS MCR    PT Start Time 0854    PT Stop Time 0936    PT Time Calculation (min) 42 min    Activity Tolerance Patient tolerated treatment well    Behavior During Therapy [facility] for tasks assessed/performed            Past Medical History:  Diagnosis Date   Aortic atherosclerosis (HCC)    Arthritis    Cataract 2010   bilateral eyes   Chicken pox    Cholelithiasis    Diverticulitis    Diverticulosis    GERD (gastroesophageal reflux disease)    Glaucoma    Hiatal hernia    History of frequent urinary tract infections    Hyperlipidemia    Osteopenia    Peripheral neuropathy    Sleep apnea    wears a CPAP   Urine incontinence    Past Surgical History:  Procedure Laterality Date   ABDOMINAL HYSTERECTOMY  1977   ANAL FISTULECTOMY  1985   ANKLE FRACTURE SURGERY Right 2014   APPENDECTOMY     BIOPSY  02/15/2021   Procedure: BIOPSY;  Surgeon: Wilhelmenia Aloha Raddle., MD;  Location: [facility];  Service: Gastroenterology;;   BREAST BIOPSY Bilateral 10+ yrs ago   BENIGN   BUNIONECTOMY Left 1975   Left Toe   CATARACT EXTRACTION, BILATERAL  2009   COLONOSCOPY WITH PROPOFOL  N/A 02/15/2021   Procedure: COLONOSCOPY WITH PROPOFOL ;  Surgeon: Wilhelmenia Aloha Raddle., MD;  Location: [facility];  Service: Gastroenterology;  Laterality: N/A;  ultraslimscope    DILATION AND CURETTAGE OF UTERUS  1972   eye PRK vision correction Left 10/09/2013   eye socket re-formed Left 2014   FOOT NEUROMA SURGERY Right 2003   plus bunionectomy, right great toe   HAMMER TOE SURGERY Right 2005   Right foot correction hammertoe little toe   HEMORRHOID SURGERY  1967   HIP  SURGERY Right 2014   right hip femur pin implanted   KNEE ARTHROSCOPY Left 2010   debridement left knee scar tissue   LAPAROSCOPIC OOPHERECTOMY     1986, 1990   LASIK  1998   NEUROMA SURGERY Right 2003   Right Foot Neuroma, plus Bunionectomy, Right Great Toe   POLYPECTOMY  02/15/2021   Procedure: POLYPECTOMY;  Surgeon: Wilhelmenia Aloha Raddle., MD;  Location: [facility];  Service: Gastroenterology;;   RECTOCELE REPAIR  1968   REPLACEMENT TOTAL KNEE Left 01/2008   REPLACEMENT TOTAL KNEE Right 08/2008   retinal pucker correction Left 07/14/2012   schwannoma tumor (benign) removal  2011   right rib   SIGMOIDOSCOPY     TONSILLECTOMY AND ADENOIDECTOMY     VENTRAL HERNIA REPAIR  1993   Patient Active Problem List   Diagnosis Date Noted   Arthralgia of right elbow 08/24/2022   Bilateral wrist pain 08/24/2022   Neck pain 08/24/2022   Pain of right hip joint 08/24/2022   Chronic pain of right knee 05/27/2021   OSA on CPAP 05/27/2021   LLQ pain 12/16/2020   History of diverticulitis 12/16/2020   Hematochezia 12/16/2020   Abnormal colonoscopy 12/16/2020   Acquired  trigger finger of right index finger 10/26/2020   Other specified arthritis, right hand 10/26/2020   Diverticulitis of colon 08/15/2020   Dry eyes, bilateral 10/10/2019   Sleep-related hypoxia 08/05/2019   OSA (obstructive sleep apnea) 08/05/2019   PLMD (periodic limb movement disorder) 08/05/2019   Cerebellar 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) 05/06/2019   Excessive daytime sleepiness 05/06/2019   Loud snoring 05/06/2019   Ventral hernia without obstruction or gangrene 02/22/2019   Chronic throat clearing 01/18/2019   Nocturnal cough 01/18/2019   Family history of colon cancer in father 01/18/2019   Diverticulosis 01/18/2019   Gait abnormality 12/27/2018   Numbness 12/27/2018   Status post total bilateral knee replacement 11/30/2018   DDD (degenerative disc disease), cervical 11/30/2018   Status post  carpal tunnel release 11/30/2018   DDD (degenerative disc disease), lumbar 10/26/2018   Primary osteoarthritis of both hands 10/26/2018   Primary osteoarthritis of both feet 10/26/2018   Gastroesophageal reflux disease 08/27/2018   Bilateral impacted cerumen 08/21/2018   Sensorineural hearing loss (SNHL) of both ears 08/21/2018   Tinnitus, bilateral 08/21/2018   Blood in urine 04/17/2018   Secondary open-angle glaucoma of left eye, moderate stage 03/10/2018   Early stage 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 10/05/2017   Epiretinal membrane (ERM) of left eye 10/05/2017   Pseudophakia of both eyes 10/05/2017   Osteopenia 03/19/2017   Mixed hyperlipidemia 08/18/2016   Generalized osteoarthritis of multiple sites 08/18/2016   Peripheral neuropathic pain 08/18/2016   Overactive bladder 08/18/2016   Glaucoma 08/18/2016   Cervical disc disorder with radiculopathy of cervical region 08/17/2016   Carpal tunnel syndrome, right upper limb 08/17/2016   Abnormal finding on mammography 06/14/2016   Breast lump 05/06/2015    PCP: Yolande Toribio MATSU, MD  REFERRING PROVIDER: Yolande Toribio MATSU, MD  REFERRING DIAG: R26.81 (ICD-10-CM) - Unsteadiness on feet/Unsteady gait  THERAPY DIAG:  Other abnormalities of gait and mobility  Unsteadiness on feet  Muscle weakness (generalized)  Rationale for Evaluation and Treatment: Rehabilitation  ONSET DATE: 2021 ; PT order 04/04/2024  SUBJECTIVE:   SUBJECTIVE STATEMENT: Pt hasn't used her electric scooter yet.  Pt states she walked on TM after prior Rx.  Pt denies any adverse effects after prior Rx.    Pt has been performing aquatic class 3x/wk.    PERTINENT HISTORY: Pt uses a SPC with wlaking.  Flexor tenotomy on 3 toes of R foot on 05/01/24--Pt reports she has no restrictions from MD. Arthritis, osteopenia, peripheral neuropathy Bilat TKR  PAIN:  Not really any pain  PRECAUTIONS: Fall and Other: peripheral  neuropathy   WEIGHT BEARING RESTRICTIONS: No  FALLS:  Has patient fallen in last 6 months? No  LIVING ENVIRONMENT: Lives with: lives alone Lives in:  1 story home Stairs: No Has following equipment at home: Single point cane, shower chair, and Grab bars  OCCUPATION:  Retired  PLOF: Independent  PATIENT GOALS: improved ability to pivot.  Improved walking on uneven terrain and performing stairs   OBJECTIVE:  Note: Objective measures were completed at Evaluation unless otherwise noted.  DIAGNOSTIC FINDINGS: N/A  TREATMENT DATE:    07/17/2024:  Nustep L5 UE/LEs x 5 mins Seated marching with GTB 3x10 LAQ with GTB 3x10 bilat Seated toe raises 2x10 Ankle DF with RTB 2x10 L, YTB x10 and RTB x10 R Step ups on 1st step with rail x 4 reps on L, x5 reps on R   Neuro Re-ed Activities: F/B and S/S sways on airex 2x10 with 1-2 occasions of UE support with SBA Marching on airex with and without UE support with mod assist for LOB 2x10 Ambulated over 4 hurdles with step to and reciprocal gait with SBA with rail support.  PT added the airex pad at the end of the hurdles for the last few reps with reciprocal gait Lateral stepping over 3 hurdles with UE support with SBA [place] with UE support on rail with SBA and one occasion of min assist    PATIENT EDUCATION:  Education details:  HEP, rationale of interventions, POC, dx, exercise form, and relevant anatomy.  PT answered pt's questions.   Person educated: Patient Education method: Explanation, demonstration, verbal cues Education comprehension: verbalized understanding and needs further education, verbal cues required, returned demonstration  HOME EXERCISE PROGRAM: Access Code: [email or web address] URL: [email or web address] Date: 05/21/2024 Prepared by: Mose Minerva  Exercises - Seated March  -  1 x daily - 7 x weekly - 2 sets - 10 reps - Seated Ankle Dorsiflexion AROM  - 1 x daily - 7 x weekly - 2 sets - 10 reps - Sit to Stand  - 1 x daily - 5-6 x weekly - 2 sets - 5-8 reps - Side Stepping with Counter Support  - 1 x daily - 7 x weekly - 2 sets - 5-10 reps  ASSESSMENT:  CLINICAL IMPRESSION: Pt gives great effort with all exercises and activities and performed them well.  Pt is weaker in R anterior tib than L and PT used a YTB for the 2nd set of DF.  Pt was challenged with step ups on the 1st step of the stairs.  Pt performed dynamic balance exercises well with UE support on rail.  PT added the airex pad to one end of the hurdles to challenge pt's balance and increase difficulty of stepping over hurdles.  Pt performed airex marches without UE support at times and did require assistance from PT to correct LOB.  Pt responded well to Rx and should benefit from cont skilled PT to address ongoing goals, improve balance and strength, reduce fall risk, and maximize functional mobility.      OBJECTIVE IMPAIRMENTS: Abnormal gait, decreased activity tolerance, decreased balance, decreased endurance, decreased mobility, difficulty walking, and decreased strength.   ACTIVITY LIMITATIONS: standing, squatting, stairs, transfers, and locomotion level  PARTICIPATION LIMITATIONS: cleaning and community activity  PERSONAL FACTORS: 3+ comorbidities: Arthritis, bilat TKA, peripheral neuropathy are also affecting patient's functional outcome.   REHAB POTENTIAL: Good  CLINICAL DECISION MAKING: Stable/uncomplicated  EVALUATION COMPLEXITY: Low   GOALS:   SHORT TERM GOALS: Target date: 06/05/2024  Pt will be independent and compliant with HEP for improved strength, balance, stability, and mobility.  Baseline: Goal status: GOAL MET 06/27/24  2.  Pt will demo improved time on TUG test by at least 5 seconds for improved mobility and reduced fall risk.   Baseline:  Goal status: 80% MET  3.  Pt will  report at least a 25% improvement in her balance.   Baseline:  Goal status: GOAL MET  06/27/24    LONG TERM GOALS: Target date:  08/08/2024  Pt will score at least 44/56 on the Berg Balance Assessment for improved balance and stability with daily tasks and mobility.  Baseline:  35/56 Goal status: INITIAL--Pt completed Berg Balance Test     2.  Pt will be able to load and unload her dishwasher with good stability and good confidence.  Baseline:  Goal status: PROGRESSING  3.  Pt will demo improved bilat LE strength to 4+/5 in hip flexion, 5/5 in knee extension, 4-/5 in R ankle DF, and a 5# increase in L hip abduction for improved performance of functional mobility and performance of household chores.  Baseline:  Goal status: PROGRESSING  4.  Pt will be able to ascend and descend stairs with a reciprocal gait with the rail.   Baseline:  Goal status: ONGOING  5.  Pt will report she is able to perform her normal ambulation with cane with good stability and confidence and without LOB. Baseline:  Goal status:ONGOING   PLAN:  PT FREQUENCY: 2x/week  PT DURATION: 6 weeks  PLANNED INTERVENTIONS: 02835- PT Re-evaluation, 97750- Physical Performance Testing, 97110-Therapeutic exercises, 97530- Therapeutic activity, [id number]- Neuromuscular re-education, 97535- Self Care, 02859- Manual therapy, [phone]- Gait training, [phone]- Aquatic Therapy, [phone]- Electrical stimulation (unattended), Patient/Family education, Balance training, Stair training, Taping, DME instructions, Cryotherapy, and Moist heat  PLAN FOR NEXT SESSION: Work on [employer].  LE strengthening and balance activities.  Work on step ups and step downs next visit.    Leigh Minerva III PT, DPT 07/18/24 1:49 PM

## 2024-07-19 ENCOUNTER — Ambulatory Visit (HOSPITAL_BASED_OUTPATIENT_CLINIC_OR_DEPARTMENT_OTHER): Payer: Self-pay | Admitting: Physical Therapy

## 2024-07-19 ENCOUNTER — Encounter (HOSPITAL_BASED_OUTPATIENT_CLINIC_OR_DEPARTMENT_OTHER): Payer: Self-pay | Admitting: Physical Therapy

## 2024-07-19 DIAGNOSIS — R2689 Other abnormalities of gait and mobility: Secondary | ICD-10-CM | POA: Diagnosis not present

## 2024-07-19 DIAGNOSIS — R2681 Unsteadiness on feet: Secondary | ICD-10-CM

## 2024-07-19 DIAGNOSIS — M6281 Muscle weakness (generalized): Secondary | ICD-10-CM

## 2024-07-19 NOTE — Therapy (Signed)
 OUTPATIENT PHYSICAL THERAPY LOWER EXTREMITY TREATMENT        Patient Name: Hailey Perkins MRN: [id number] DOB:Jan 30, 1935, 89 y.o., female Today's Date: 07/19/2024  END OF SESSION:  PT End of Session - 07/19/24 1119     Visit Number 14    Number of Visits 21    Date for PT Re-Evaluation 08/08/24    Authorization Type BCBS MCR    PT Start Time 1027    PT Stop Time 1110    PT Time Calculation (min) 43 min    Activity Tolerance Patient tolerated treatment well    Behavior During Therapy WFL for tasks assessed/performed             Past Medical History:  Diagnosis Date   Aortic atherosclerosis (HCC)    Arthritis    Cataract 2010   bilateral eyes   Chicken pox    Cholelithiasis    Diverticulitis    Diverticulosis    GERD (gastroesophageal reflux disease)    Glaucoma    Hiatal hernia    History of frequent urinary tract infections    Hyperlipidemia    Osteopenia    Peripheral neuropathy    Sleep apnea    wears a CPAP   Urine incontinence    Past Surgical History:  Procedure Laterality Date   ABDOMINAL HYSTERECTOMY  1977   ANAL FISTULECTOMY  1985   ANKLE FRACTURE SURGERY Right 2014   APPENDECTOMY     BIOPSY  02/15/2021   Procedure: BIOPSY;  Surgeon: Wilhelmenia Aloha Raddle., MD;  Location: [facility];  Service: Gastroenterology;;   BREAST BIOPSY Bilateral 10+ yrs ago   BENIGN   BUNIONECTOMY Left 1975   Left Toe   CATARACT EXTRACTION, BILATERAL  2009   COLONOSCOPY WITH PROPOFOL  N/A 02/15/2021   Procedure: COLONOSCOPY WITH PROPOFOL ;  Surgeon: Wilhelmenia Aloha Raddle., MD;  Location: [facility];  Service: Gastroenterology;  Laterality: N/A;  ultraslimscope    DILATION AND CURETTAGE OF UTERUS  1972   eye PRK vision correction Left 10/09/2013   eye socket re-formed Left 2014   FOOT NEUROMA SURGERY Right 2003   plus bunionectomy, right great toe   HAMMER TOE SURGERY Right 2005   Right foot correction hammertoe little toe   HEMORRHOID SURGERY  1967    HIP SURGERY Right 2014   right hip femur pin implanted   KNEE ARTHROSCOPY Left 2010   debridement left knee scar tissue   LAPAROSCOPIC OOPHERECTOMY     1986, 1990   LASIK  1998   NEUROMA SURGERY Right 2003   Right Foot Neuroma, plus Bunionectomy, Right Great Toe   POLYPECTOMY  02/15/2021   Procedure: POLYPECTOMY;  Surgeon: Wilhelmenia Aloha Raddle., MD;  Location: [facility];  Service: Gastroenterology;;   RECTOCELE REPAIR  1968   REPLACEMENT TOTAL KNEE Left 01/2008   REPLACEMENT TOTAL KNEE Right 08/2008   retinal pucker correction Left 07/14/2012   schwannoma tumor (benign) removal  2011   right rib   SIGMOIDOSCOPY     TONSILLECTOMY AND ADENOIDECTOMY     VENTRAL HERNIA REPAIR  1993   Patient Active Problem List   Diagnosis Date Noted   Arthralgia of right elbow 08/24/2022   Bilateral wrist pain 08/24/2022   Neck pain 08/24/2022   Pain of right hip joint 08/24/2022   Chronic pain of right knee 05/27/2021   OSA on CPAP 05/27/2021   LLQ pain 12/16/2020   History of diverticulitis 12/16/2020   Hematochezia 12/16/2020   Abnormal colonoscopy 12/16/2020  Acquired trigger finger of right index finger 10/26/2020   Other specified arthritis, right hand 10/26/2020   Diverticulitis of colon 08/15/2020   Dry eyes, bilateral 10/10/2019   Sleep-related hypoxia 08/05/2019   OSA (obstructive sleep apnea) 08/05/2019   PLMD (periodic limb movement disorder) 08/05/2019   Cerebellar 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) 05/06/2019   Excessive daytime sleepiness 05/06/2019   Loud snoring 05/06/2019   Ventral hernia without obstruction or gangrene 02/22/2019   Chronic throat clearing 01/18/2019   Nocturnal cough 01/18/2019   Family history of colon cancer in father 01/18/2019   Diverticulosis 01/18/2019   Gait abnormality 12/27/2018   Numbness 12/27/2018   Status post total bilateral knee replacement 11/30/2018   DDD (degenerative disc disease), cervical 11/30/2018   Status  post carpal tunnel release 11/30/2018   DDD (degenerative disc disease), lumbar 10/26/2018   Primary osteoarthritis of both hands 10/26/2018   Primary osteoarthritis of both feet 10/26/2018   Gastroesophageal reflux disease 08/27/2018   Bilateral impacted cerumen 08/21/2018   Sensorineural hearing loss (SNHL) of both ears 08/21/2018   Tinnitus, bilateral 08/21/2018   Blood in urine 04/17/2018   Secondary open-angle glaucoma of left eye, moderate stage 03/10/2018   Early stage 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 10/05/2017   Epiretinal membrane (ERM) of left eye 10/05/2017   Pseudophakia of both eyes 10/05/2017   Osteopenia 03/19/2017   Mixed hyperlipidemia 08/18/2016   Generalized osteoarthritis of multiple sites 08/18/2016   Peripheral neuropathic pain 08/18/2016   Overactive bladder 08/18/2016   Glaucoma 08/18/2016   Cervical disc disorder with radiculopathy of cervical region 08/17/2016   Carpal tunnel syndrome, right upper limb 08/17/2016   Abnormal finding on mammography 06/14/2016   Breast lump 05/06/2015    PCP: Yolande Toribio MATSU, MD  REFERRING PROVIDER: Yolande Toribio MATSU, MD  REFERRING DIAG: R26.81 (ICD-10-CM) - Unsteadiness on feet/Unsteady gait  THERAPY DIAG:  Other abnormalities of gait and mobility  Unsteadiness on feet  Muscle weakness (generalized)  Rationale for Evaluation and Treatment: Rehabilitation  ONSET DATE: 2021 ; PT order 04/04/2024  SUBJECTIVE:   SUBJECTIVE STATEMENT: Pt denies any adverse effects after prior Rx.  Pt has been tired and spoke to her MD about her fatigue.  Pt states she has a thyroid deficiency and MD prescribed a new medication.    Pt has been performing aquatic class 3x/wk though unable to attend this week.SABRA    PERTINENT HISTORY: Pt uses a SPC with wlaking.  Flexor tenotomy on 3 toes of R foot on 05/01/24--Pt reports she has no restrictions from MD. Arthritis, osteopenia, peripheral neuropathy Bilat  TKR  PAIN:  Pt denies any hip or knee pain.  PRECAUTIONS: Fall and Other: peripheral neuropathy   WEIGHT BEARING RESTRICTIONS: No  FALLS:  Has patient fallen in last 6 months? No  LIVING ENVIRONMENT: Lives with: lives alone Lives in:  1 story home Stairs: No Has following equipment at home: Single point cane, shower chair, and Grab bars  OCCUPATION:  Retired  PLOF: Independent  PATIENT GOALS: improved ability to pivot.  Improved walking on uneven terrain and performing stairs   OBJECTIVE:  Note: Objective measures were completed at Evaluation unless otherwise noted.  DIAGNOSTIC FINDINGS: N/A  TREATMENT DATE:    07/19/2024:  Nustep L5 UE/LEs x 5 mins Seated toe raises 2x10 Ankle DF with RTB 2x10 L, YTB 2x10 R Step ups on 1st step with rail x5 reps bilat; 1x 4 reps on L, 1x3 reps on R   Neuro Re-ed Activities: Marching on airex with and without UE support with mod assist for LOB 2x10 Standing heel raises on airex 2x10 with UE support  Ambulated bwd x 3 laps with rail support Ambulated over 4 hurdles with a reciprocal gait with SBA with rail support.  She also performed with the airex pad at the end of the hurdles Lateral stepping over 3 hurdles with UE support with SBA [place] with UE support on rail with SBA and one occasion of min assist    PATIENT EDUCATION:  Education details:  HEP, rationale of interventions, POC, dx, exercise form, and relevant anatomy.  PT answered pt's questions.   Person educated: Patient Education method: Explanation, demonstration, verbal cues Education comprehension: verbalized understanding and needs further education, verbal cues required, returned demonstration  HOME EXERCISE PROGRAM: Access Code: [email or web address] URL: [email or web address] Date: 05/21/2024 Prepared by: Mose Minerva  Exercises - Seated March  - 1 x daily - 7 x weekly - 2 sets - 10 reps - Seated Ankle Dorsiflexion AROM  - 1 x daily - 7 x weekly - 2 sets - 10 reps - Sit to Stand  - 1 x daily - 5-6 x weekly - 2 sets - 5-8 reps - Side Stepping with Counter Support  - 1 x daily - 7 x weekly - 2 sets - 5-10 reps  ASSESSMENT:  CLINICAL IMPRESSION: Pt gives great effort with all exercises and activities.  PT worked on improving foot clearance, DF strength, functional LE strength, balance, and proprioception.  Pt is weaker in R anterior tib than L and PT used a YTB with DF.  Pt has more difficulty with step ups on R LE than L LE and PT increased reps with step ups on the first step today.  She requires the usage of the rail to perform step ups.  Pt performed dynamic balance exercises well with UE support on rail.  Pt performed airex marches without UE support at times.  She did require assistance from PT to correct LOB occasionally though not as much today compared to prior Rx.  Pt tolerated exercises and activities well today and had seated rest breaks as needed during Rx.  Pt responded well to Rx and should benefit from cont skilled PT to address ongoing goals, improve balance and strength, reduce fall risk, and maximize functional mobility.     OBJECTIVE IMPAIRMENTS: Abnormal gait, decreased activity tolerance, decreased balance, decreased endurance, decreased mobility, difficulty walking, and decreased strength.   ACTIVITY LIMITATIONS: standing, squatting, stairs, transfers, and locomotion level  PARTICIPATION LIMITATIONS: cleaning and community activity  PERSONAL FACTORS: 3+ comorbidities: Arthritis, bilat TKA, peripheral neuropathy are also affecting patient's functional outcome.   REHAB POTENTIAL: Good  CLINICAL DECISION MAKING: Stable/uncomplicated  EVALUATION COMPLEXITY: Low   GOALS:   SHORT TERM GOALS: Target date: 06/05/2024  Pt will be independent and compliant with HEP for improved  strength, balance, stability, and mobility.  Baseline: Goal status: GOAL MET 06/27/24  2.  Pt will demo improved time on TUG test by at least 5 seconds for improved mobility and reduced fall risk.   Baseline:  Goal status: 80% MET  3.  Pt will report at least a 25% improvement in her balance.  Baseline:  Goal status: GOAL MET  06/27/24    LONG TERM GOALS: Target date:  08/08/2024   Pt will score at least 44/56 on the Berg Balance Assessment for improved balance and stability with daily tasks and mobility.  Baseline:  35/56 Goal status: INITIAL--Pt completed Berg Balance Test     2.  Pt will be able to load and unload her dishwasher with good stability and good confidence.  Baseline:  Goal status: PROGRESSING  3.  Pt will demo improved bilat LE strength to 4+/5 in hip flexion, 5/5 in knee extension, 4-/5 in R ankle DF, and a 5# increase in L hip abduction for improved performance of functional mobility and performance of household chores.  Baseline:  Goal status: PROGRESSING  4.  Pt will be able to ascend and descend stairs with a reciprocal gait with the rail.   Baseline:  Goal status: ONGOING  5.  Pt will report she is able to perform her normal ambulation with cane with good stability and confidence and without LOB. Baseline:  Goal status:ONGOING   PLAN:  PT FREQUENCY: 2x/week  PT DURATION: 6 weeks  PLANNED INTERVENTIONS: 02835- PT Re-evaluation, 97750- Physical Performance Testing, 97110-Therapeutic exercises, 97530- Therapeutic activity, [id number]- Neuromuscular re-education, 97535- Self Care, 02859- Manual therapy, [phone]- Gait training, [phone]- Aquatic Therapy, [phone]- Electrical stimulation (unattended), Patient/Family education, Balance training, Stair training, Taping, DME instructions, Cryotherapy, and Moist heat  PLAN FOR NEXT SESSION: Work on [employer].  LE strengthening and balance activities.  Work on step ups and step downs next visit.    Leigh Minerva III PT,  DPT 07/19/24 11:41 AM

## 2024-07-22 ENCOUNTER — Encounter (HOSPITAL_BASED_OUTPATIENT_CLINIC_OR_DEPARTMENT_OTHER): Payer: Self-pay | Admitting: Physical Therapy

## 2024-07-22 ENCOUNTER — Ambulatory Visit (HOSPITAL_BASED_OUTPATIENT_CLINIC_OR_DEPARTMENT_OTHER): Admitting: Physical Therapy

## 2024-07-22 DIAGNOSIS — M6281 Muscle weakness (generalized): Secondary | ICD-10-CM

## 2024-07-22 DIAGNOSIS — R2689 Other abnormalities of gait and mobility: Secondary | ICD-10-CM

## 2024-07-22 DIAGNOSIS — R2681 Unsteadiness on feet: Secondary | ICD-10-CM

## 2024-07-22 NOTE — Therapy (Signed)
 OUTPATIENT PHYSICAL THERAPY LOWER EXTREMITY TREATMENT        Patient Name: Hailey Perkins MRN: [id number] DOB:February 17, 1935, 89 y.o., female Today's Date: 07/22/2024  END OF SESSION:  PT End of Session - 07/22/24 1036     Visit Number 15    Number of Visits 21    Date for PT Re-Evaluation 08/08/24    Authorization Type BCBS MCR    PT Start Time 1020    PT Stop Time 1104    PT Time Calculation (min) 44 min    Activity Tolerance Patient tolerated treatment well    Behavior During Therapy WFL for tasks assessed/performed              Past Medical History:  Diagnosis Date   Aortic atherosclerosis (HCC)    Arthritis    Cataract 2010   bilateral eyes   Chicken pox    Cholelithiasis    Diverticulitis    Diverticulosis    GERD (gastroesophageal reflux disease)    Glaucoma    Hiatal hernia    History of frequent urinary tract infections    Hyperlipidemia    Osteopenia    Peripheral neuropathy    Sleep apnea    wears a CPAP   Urine incontinence    Past Surgical History:  Procedure Laterality Date   ABDOMINAL HYSTERECTOMY  1977   ANAL FISTULECTOMY  1985   ANKLE FRACTURE SURGERY Right 2014   APPENDECTOMY     BIOPSY  02/15/2021   Procedure: BIOPSY;  Surgeon: Wilhelmenia Aloha Raddle., MD;  Location: [facility];  Service: Gastroenterology;;   BREAST BIOPSY Bilateral 10+ yrs ago   BENIGN   BUNIONECTOMY Left 1975   Left Toe   CATARACT EXTRACTION, BILATERAL  2009   COLONOSCOPY WITH PROPOFOL  N/A 02/15/2021   Procedure: COLONOSCOPY WITH PROPOFOL ;  Surgeon: Wilhelmenia Aloha Raddle., MD;  Location: [facility];  Service: Gastroenterology;  Laterality: N/A;  ultraslimscope    DILATION AND CURETTAGE OF UTERUS  1972   eye PRK vision correction Left 10/09/2013   eye socket re-formed Left 2014   FOOT NEUROMA SURGERY Right 2003   plus bunionectomy, right great toe   HAMMER TOE SURGERY Right 2005   Right foot correction hammertoe little toe   HEMORRHOID SURGERY  1967    HIP SURGERY Right 2014   right hip femur pin implanted   KNEE ARTHROSCOPY Left 2010   debridement left knee scar tissue   LAPAROSCOPIC OOPHERECTOMY     1986, 1990   LASIK  1998   NEUROMA SURGERY Right 2003   Right Foot Neuroma, plus Bunionectomy, Right Great Toe   POLYPECTOMY  02/15/2021   Procedure: POLYPECTOMY;  Surgeon: Wilhelmenia Aloha Raddle., MD;  Location: [facility];  Service: Gastroenterology;;   RECTOCELE REPAIR  1968   REPLACEMENT TOTAL KNEE Left 01/2008   REPLACEMENT TOTAL KNEE Right 08/2008   retinal pucker correction Left 07/14/2012   schwannoma tumor (benign) removal  2011   right rib   SIGMOIDOSCOPY     TONSILLECTOMY AND ADENOIDECTOMY     VENTRAL HERNIA REPAIR  1993   Patient Active Problem List   Diagnosis Date Noted   Arthralgia of right elbow 08/24/2022   Bilateral wrist pain 08/24/2022   Neck pain 08/24/2022   Pain of right hip joint 08/24/2022   Chronic pain of right knee 05/27/2021   OSA on CPAP 05/27/2021   LLQ pain 12/16/2020   History of diverticulitis 12/16/2020   Hematochezia 12/16/2020   Abnormal colonoscopy 12/16/2020  Acquired trigger finger of right index finger 10/26/2020   Other specified arthritis, right hand 10/26/2020   Diverticulitis of colon 08/15/2020   Dry eyes, bilateral 10/10/2019   Sleep-related hypoxia 08/05/2019   OSA (obstructive sleep apnea) 08/05/2019   PLMD (periodic limb movement disorder) 08/05/2019   Cerebellar 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) 05/06/2019   Excessive daytime sleepiness 05/06/2019   Loud snoring 05/06/2019   Ventral hernia without obstruction or gangrene 02/22/2019   Chronic throat clearing 01/18/2019   Nocturnal cough 01/18/2019   Family history of colon cancer in father 01/18/2019   Diverticulosis 01/18/2019   Gait abnormality 12/27/2018   Numbness 12/27/2018   Status post total bilateral knee replacement 11/30/2018   DDD (degenerative disc disease), cervical 11/30/2018   Status  post carpal tunnel release 11/30/2018   DDD (degenerative disc disease), lumbar 10/26/2018   Primary osteoarthritis of both hands 10/26/2018   Primary osteoarthritis of both feet 10/26/2018   Gastroesophageal reflux disease 08/27/2018   Bilateral impacted cerumen 08/21/2018   Sensorineural hearing loss (SNHL) of both ears 08/21/2018   Tinnitus, bilateral 08/21/2018   Blood in urine 04/17/2018   Secondary open-angle glaucoma of left eye, moderate stage 03/10/2018   Early stage 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 10/05/2017   Epiretinal membrane (ERM) of left eye 10/05/2017   Pseudophakia of both eyes 10/05/2017   Osteopenia 03/19/2017   Mixed hyperlipidemia 08/18/2016   Generalized osteoarthritis of multiple sites 08/18/2016   Peripheral neuropathic pain 08/18/2016   Overactive bladder 08/18/2016   Glaucoma 08/18/2016   Cervical disc disorder with radiculopathy of cervical region 08/17/2016   Carpal tunnel syndrome, right upper limb 08/17/2016   Abnormal finding on mammography 06/14/2016   Breast lump 05/06/2015    PCP: Yolande Toribio MATSU, MD  REFERRING PROVIDER: Yolande Toribio MATSU, MD  REFERRING DIAG: R26.81 (ICD-10-CM) - Unsteadiness on feet/Unsteady gait  THERAPY DIAG:  Other abnormalities of gait and mobility  Unsteadiness on feet  Muscle weakness (generalized)  Rationale for Evaluation and Treatment: Rehabilitation  ONSET DATE: 2021 ; PT order 04/04/2024  SUBJECTIVE:   SUBJECTIVE STATEMENT: Pt denies any adverse effects after prior Rx.  Pt states her R foot didn't get stuck in the floor last week.  Pt denies pain currently.        PERTINENT HISTORY: Pt uses a SPC with wlaking.  Flexor tenotomy on 3 toes of R foot on 05/01/24--Pt reports she has no restrictions from MD. Arthritis, osteopenia, peripheral neuropathy Bilat TKR  PAIN:  No pain  PRECAUTIONS: Fall and Other: peripheral neuropathy   WEIGHT BEARING RESTRICTIONS:  No  FALLS:  Has patient fallen in last 6 months? No  LIVING ENVIRONMENT: Lives with: lives alone Lives in:  1 story home Stairs: No Has following equipment at home: Single point cane, shower chair, and Grab bars  OCCUPATION:  Retired  PLOF: Independent  PATIENT GOALS: improved ability to pivot.  Improved walking on uneven terrain and performing stairs   OBJECTIVE:  Note: Objective measures were completed at Evaluation unless otherwise noted.  DIAGNOSTIC FINDINGS: N/A  TREATMENT DATE:    07/22/2024:  Therapeutic Exercise: Nustep L5 UE/LEs x 5 mins Seated toe raises 2x10 Ankle DF with RTB 2x10 L, YTB 2x10 R   Therapeutic Activities: Step ups on 1st step with rail x5 and x 4 reps bilat Sit to stands with GTB around thighs 2x10 Lateral step ups 4 inch step with rail 2x10  Neuro Re-ed Activities: Marching on airex with and without UE support with mod assist for LOB 2x10 Standing heel raises on airex 2x10 with UE support Standing on airex pad with rhythmic stab's 3x30 sec Ambulated over 4 hurdles with a reciprocal gait with SBA with rail support.  She also performed with the airex pad at the end of the hurdles     PATIENT EDUCATION:  Education details:  HEP, rationale of interventions, POC, dx, exercise form, and relevant anatomy.  PT answered pt's questions.   Person educated: Patient Education method: Explanation, demonstration, verbal cues Education comprehension: verbalized understanding and needs further education, verbal cues required, returned demonstration  HOME EXERCISE PROGRAM: Access Code: [email or web address] URL: [email or web address] Date: 05/21/2024 Prepared by: Mose Minerva  Exercises - Seated March  - 1 x daily - 7 x weekly - 2 sets - 10 reps - Seated Ankle Dorsiflexion AROM  - 1 x daily - 7 x weekly - 2 sets - 10  reps - Sit to Stand  - 1 x daily - 5-6 x weekly - 2 sets - 5-8 reps - Side Stepping with Counter Support  - 1 x daily - 7 x weekly - 2 sets - 5-10 reps  ASSESSMENT:  CLINICAL IMPRESSION: Pt is improving with balance, mobility, and strength.  She reports improved ability to not drag her foot with gait.  She gives great effort with all exercises and activities and performed them well.  PT worked on improving foot clearance, DF strength, functional LE strength, balance, and proprioception.  Pt required cuing for correct positioning of feet with sit to stands.  She is weaker in R anterior tib than L and PT used a YTB with DF.  Pt has more difficulty with step ups on R LE than L LE though is improving.  She requires the rail to perform step ups.  PT added lateral step ups on a lower step today with Ue's on rail.  Pt tolerated exercises and activities well and was fatigued with treatment.  She should benefit from cont skilled PT to address ongoing goals, improve balance and strength, reduce fall risk, and maximize functional mobility.     OBJECTIVE IMPAIRMENTS: Abnormal gait, decreased activity tolerance, decreased balance, decreased endurance, decreased mobility, difficulty walking, and decreased strength.   ACTIVITY LIMITATIONS: standing, squatting, stairs, transfers, and locomotion level  PARTICIPATION LIMITATIONS: cleaning and community activity  PERSONAL FACTORS: 3+ comorbidities: Arthritis, bilat TKA, peripheral neuropathy are also affecting patient's functional outcome.   REHAB POTENTIAL: Good  CLINICAL DECISION MAKING: Stable/uncomplicated  EVALUATION COMPLEXITY: Low   GOALS:   SHORT TERM GOALS: Target date: 06/05/2024  Pt will be independent and compliant with HEP for improved strength, balance, stability, and mobility.  Baseline: Goal status: GOAL MET 06/27/24  2.  Pt will demo improved time on TUG test by at least 5 seconds for improved mobility and reduced fall risk.    Baseline:  Goal status: 80% MET  3.  Pt will report at least a 25% improvement in her balance.   Baseline:  Goal status: GOAL MET  06/27/24    LONG TERM GOALS: Target date:  08/08/2024   Pt will score at least 44/56 on the Berg Balance Assessment for improved balance and stability with daily tasks and mobility.  Baseline:  35/56 Goal status: INITIAL--Pt completed Berg Balance Test     2.  Pt will be able to load and unload her dishwasher with good stability and good confidence.  Baseline:  Goal status: PROGRESSING  3.  Pt will demo improved bilat LE strength to 4+/5 in hip flexion, 5/5 in knee extension, 4-/5 in R ankle DF, and a 5# increase in L hip abduction for improved performance of functional mobility and performance of household chores.  Baseline:  Goal status: PROGRESSING  4.  Pt will be able to ascend and descend stairs with a reciprocal gait with the rail.   Baseline:  Goal status: ONGOING  5.  Pt will report she is able to perform her normal ambulation with cane with good stability and confidence and without LOB. Baseline:  Goal status:ONGOING   PLAN:  PT FREQUENCY: 2x/week  PT DURATION: 6 weeks  PLANNED INTERVENTIONS: 02835- PT Re-evaluation, 97750- Physical Performance Testing, 97110-Therapeutic exercises, 97530- Therapeutic activity, [id number]- Neuromuscular re-education, 97535- Self Care, 02859- Manual therapy, [phone]- Gait training, [phone]- Aquatic Therapy, [phone]- Electrical stimulation (unattended), Patient/Family education, Balance training, Stair training, Taping, DME instructions, Cryotherapy, and Moist heat  PLAN FOR NEXT SESSION: Work on [employer].  LE strengthening and balance activities.  Work on step ups and step downs next visit.    Leigh Minerva III PT, DPT 07/22/24 1:35 PM

## 2024-07-24 ENCOUNTER — Encounter (HOSPITAL_BASED_OUTPATIENT_CLINIC_OR_DEPARTMENT_OTHER): Payer: Self-pay | Admitting: Physical Therapy

## 2024-07-24 ENCOUNTER — Ambulatory Visit (HOSPITAL_BASED_OUTPATIENT_CLINIC_OR_DEPARTMENT_OTHER): Admitting: Physical Therapy

## 2024-07-24 DIAGNOSIS — R2689 Other abnormalities of gait and mobility: Secondary | ICD-10-CM | POA: Diagnosis not present

## 2024-07-24 DIAGNOSIS — R2681 Unsteadiness on feet: Secondary | ICD-10-CM

## 2024-07-24 DIAGNOSIS — M6281 Muscle weakness (generalized): Secondary | ICD-10-CM

## 2024-07-24 NOTE — Therapy (Signed)
 OUTPATIENT PHYSICAL THERAPY LOWER EXTREMITY TREATMENT        Patient Name: Hailey Perkins MRN: [id number] DOB:28-Feb-1935, 89 y.o., female Today's Date: 07/25/2024  END OF SESSION:  PT End of Session - 07/24/24 0857     Visit Number 16    Number of Visits 21    Date for PT Re-Evaluation 08/08/24    Authorization Type BCBS MCR    PT Start Time 0848    PT Stop Time 0931    PT Time Calculation (min) 43 min    Activity Tolerance Patient tolerated treatment well    Behavior During Therapy WFL for tasks assessed/performed               Past Medical History:  Diagnosis Date   Aortic atherosclerosis (HCC)    Arthritis    Cataract 2010   bilateral eyes   Chicken pox    Cholelithiasis    Diverticulitis    Diverticulosis    GERD (gastroesophageal reflux disease)    Glaucoma    Hiatal hernia    History of frequent urinary tract infections    Hyperlipidemia    Osteopenia    Peripheral neuropathy    Sleep apnea    wears a CPAP   Urine incontinence    Past Surgical History:  Procedure Laterality Date   ABDOMINAL HYSTERECTOMY  1977   ANAL FISTULECTOMY  1985   ANKLE FRACTURE SURGERY Right 2014   APPENDECTOMY     BIOPSY  02/15/2021   Procedure: BIOPSY;  Surgeon: Wilhelmenia Aloha Raddle., MD;  Location: [facility];  Service: Gastroenterology;;   BREAST BIOPSY Bilateral 10+ yrs ago   BENIGN   BUNIONECTOMY Left 1975   Left Toe   CATARACT EXTRACTION, BILATERAL  2009   COLONOSCOPY WITH PROPOFOL  N/A 02/15/2021   Procedure: COLONOSCOPY WITH PROPOFOL ;  Surgeon: Wilhelmenia Aloha Raddle., MD;  Location: [facility];  Service: Gastroenterology;  Laterality: N/A;  ultraslimscope    DILATION AND CURETTAGE OF UTERUS  1972   eye PRK vision correction Left 10/09/2013   eye socket re-formed Left 2014   FOOT NEUROMA SURGERY Right 2003   plus bunionectomy, right great toe   HAMMER TOE SURGERY Right 2005   Right foot correction hammertoe little toe   HEMORRHOID SURGERY  1967    HIP SURGERY Right 2014   right hip femur pin implanted   KNEE ARTHROSCOPY Left 2010   debridement left knee scar tissue   LAPAROSCOPIC OOPHERECTOMY     1986, 1990   LASIK  1998   NEUROMA SURGERY Right 2003   Right Foot Neuroma, plus Bunionectomy, Right Great Toe   POLYPECTOMY  02/15/2021   Procedure: POLYPECTOMY;  Surgeon: Wilhelmenia Aloha Raddle., MD;  Location: [facility];  Service: Gastroenterology;;   RECTOCELE REPAIR  1968   REPLACEMENT TOTAL KNEE Left 01/2008   REPLACEMENT TOTAL KNEE Right 08/2008   retinal pucker correction Left 07/14/2012   schwannoma tumor (benign) removal  2011   right rib   SIGMOIDOSCOPY     TONSILLECTOMY AND ADENOIDECTOMY     VENTRAL HERNIA REPAIR  1993   Patient Active Problem List   Diagnosis Date Noted   Arthralgia of right elbow 08/24/2022   Bilateral wrist pain 08/24/2022   Neck pain 08/24/2022   Pain of right hip joint 08/24/2022   Chronic pain of right knee 05/27/2021   OSA on CPAP 05/27/2021   LLQ pain 12/16/2020   History of diverticulitis 12/16/2020   Hematochezia 12/16/2020   Abnormal colonoscopy 12/16/2020  Acquired trigger finger of right index finger 10/26/2020   Other specified arthritis, right hand 10/26/2020   Diverticulitis of colon 08/15/2020   Dry eyes, bilateral 10/10/2019   Sleep-related hypoxia 08/05/2019   OSA (obstructive sleep apnea) 08/05/2019   PLMD (periodic limb movement disorder) 08/05/2019   Cerebellar 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) 05/06/2019   Excessive daytime sleepiness 05/06/2019   Loud snoring 05/06/2019   Ventral hernia without obstruction or gangrene 02/22/2019   Chronic throat clearing 01/18/2019   Nocturnal cough 01/18/2019   Family history of colon cancer in father 01/18/2019   Diverticulosis 01/18/2019   Gait abnormality 12/27/2018   Numbness 12/27/2018   Status post total bilateral knee replacement 11/30/2018   DDD (degenerative disc disease), cervical 11/30/2018   Status  post carpal tunnel release 11/30/2018   DDD (degenerative disc disease), lumbar 10/26/2018   Primary osteoarthritis of both hands 10/26/2018   Primary osteoarthritis of both feet 10/26/2018   Gastroesophageal reflux disease 08/27/2018   Bilateral impacted cerumen 08/21/2018   Sensorineural hearing loss (SNHL) of both ears 08/21/2018   Tinnitus, bilateral 08/21/2018   Blood in urine 04/17/2018   Secondary open-angle glaucoma of left eye, moderate stage 03/10/2018   Early stage 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 10/05/2017   Epiretinal membrane (ERM) of left eye 10/05/2017   Pseudophakia of both eyes 10/05/2017   Osteopenia 03/19/2017   Mixed hyperlipidemia 08/18/2016   Generalized osteoarthritis of multiple sites 08/18/2016   Peripheral neuropathic pain 08/18/2016   Overactive bladder 08/18/2016   Glaucoma 08/18/2016   Cervical disc disorder with radiculopathy of cervical region 08/17/2016   Carpal tunnel syndrome, right upper limb 08/17/2016   Abnormal finding on mammography 06/14/2016   Breast lump 05/06/2015    PCP: Yolande Toribio MATSU, MD  REFERRING PROVIDER: Yolande Toribio MATSU, MD  REFERRING DIAG: R26.81 (ICD-10-CM) - Unsteadiness on feet/Unsteady gait  THERAPY DIAG:  Other abnormalities of gait and mobility  Unsteadiness on feet  Muscle weakness (generalized)  Rationale for Evaluation and Treatment: Rehabilitation  ONSET DATE: 2021 ; PT order 04/04/2024  SUBJECTIVE:   SUBJECTIVE STATEMENT: Pt denies any adverse effects after prior Rx.  Pt states she had a little soreness in L lateral thigh after prior Rx.  Pt had a massage later that day.  Pt has a little discomfort in L hip and sees her chiropractor today.       PERTINENT HISTORY: Pt uses a SPC with wlaking.  Flexor tenotomy on 3 toes of R foot on 05/01/24--Pt reports she has no restrictions from MD. Arthritis, osteopenia, peripheral neuropathy Bilat TKR  PAIN:  Little discomfort in L  hip   PRECAUTIONS: Fall and Other: peripheral neuropathy   WEIGHT BEARING RESTRICTIONS: No  FALLS:  Has patient fallen in last 6 months? No  LIVING ENVIRONMENT: Lives with: lives alone Lives in:  1 story home Stairs: No Has following equipment at home: Single point cane, shower chair, and Grab bars  OCCUPATION:  Retired  PLOF: Independent  PATIENT GOALS: improved ability to pivot.  Improved walking on uneven terrain and performing stairs   OBJECTIVE:  Note: Objective measures were completed at Evaluation unless otherwise noted.  DIAGNOSTIC FINDINGS: N/A  TREATMENT DATE:    07/24/2024:  Therapeutic Exercise: Nustep L5 UE/LEs x 5 mins LAQ with GTB 2x10 bilat Ankle DF with RTB 2x10 L, YTB 2x10 R   Therapeutic Activities: Step ups on 1st step with rail x5 reps bilat, x 4 reps on L, and x 3 reps on R Sit to stands with GTB around thighs 2x10 Lateral step ups 4 inch step with rail x10  Neuro Re-ed Activities: Marching on airex with and without UE support with mod assist for LOB 2x10 Standing heel raises on airex 2x10 with UE support Standing on airex pad with rhythmic stab's 3x30 sec  min assist occasionally and 2 occasions of mod assist Braiding with UE support on rail with SBA Tandem gait with UE support on rail with SBA     PATIENT EDUCATION:  Education details:  HEP, rationale of interventions, POC, dx, exercise form, and relevant anatomy.  PT answered pt's questions.   Person educated: Patient Education method: Explanation, demonstration, verbal cues Education comprehension: verbalized understanding and needs further education, verbal cues required, returned demonstration  HOME EXERCISE PROGRAM: Access Code: [email or web address] URL: [email or web address] Date: 05/21/2024 Prepared by: Mose Minerva  Exercises - Seated March   - 1 x daily - 7 x weekly - 2 sets - 10 reps - Seated Ankle Dorsiflexion AROM  - 1 x daily - 7 x weekly - 2 sets - 10 reps - Sit to Stand  - 1 x daily - 5-6 x weekly - 2 sets - 5-8 reps - Side Stepping with Counter Support  - 1 x daily - 7 x weekly - 2 sets - 5-10 reps  ASSESSMENT:  CLINICAL IMPRESSION: Pt is progressing well with balance, mobility, and strength.  Pt is motivated and gave great effort with all exercises and activities.  Pt requires the rail with step ups and has greater difficulty on R LE than L LE.  Pt used the rail for assistance as needed with airex marches, though did require 2 occasions of mod assistance from PT for LOB.  Pt required min assist occasionally with standing rhythmic stab's on airex, though had 2 posterior LOB's requiring mod assistance for LOB.  Pt tolerated exercises and activities well.  She should benefit from cont skilled PT to address ongoing goals, improve balance and strength, reduce fall risk, and maximize functional mobility.       OBJECTIVE IMPAIRMENTS: Abnormal gait, decreased activity tolerance, decreased balance, decreased endurance, decreased mobility, difficulty walking, and decreased strength.   ACTIVITY LIMITATIONS: standing, squatting, stairs, transfers, and locomotion level  PARTICIPATION LIMITATIONS: cleaning and community activity  PERSONAL FACTORS: 3+ comorbidities: Arthritis, bilat TKA, peripheral neuropathy are also affecting patient's functional outcome.   REHAB POTENTIAL: Good  CLINICAL DECISION MAKING: Stable/uncomplicated  EVALUATION COMPLEXITY: Low   GOALS:   SHORT TERM GOALS: Target date: 06/05/2024  Pt will be independent and compliant with HEP for improved strength, balance, stability, and mobility.  Baseline: Goal status: GOAL MET 06/27/24  2.  Pt will demo improved time on TUG test by at least 5 seconds for improved mobility and reduced fall risk.   Baseline:  Goal status: 80% MET  3.  Pt will report at least  a 25% improvement in her balance.   Baseline:  Goal status: GOAL MET  06/27/24    LONG TERM GOALS: Target date:  08/08/2024   Pt will score at least 44/56 on the Berg Balance Assessment for improved balance and stability with daily tasks and mobility.  Baseline:  35/56  Goal status: INITIAL--Pt completed Berg Balance Test     2.  Pt will be able to load and unload her dishwasher with good stability and good confidence.  Baseline:  Goal status: PROGRESSING  3.  Pt will demo improved bilat LE strength to 4+/5 in hip flexion, 5/5 in knee extension, 4-/5 in R ankle DF, and a 5# increase in L hip abduction for improved performance of functional mobility and performance of household chores.  Baseline:  Goal status: PROGRESSING  4.  Pt will be able to ascend and descend stairs with a reciprocal gait with the rail.   Baseline:  Goal status: ONGOING  5.  Pt will report she is able to perform her normal ambulation with cane with good stability and confidence and without LOB. Baseline:  Goal status:ONGOING   PLAN:  PT FREQUENCY: 2x/week  PT DURATION: 6 weeks  PLANNED INTERVENTIONS: 02835- PT Re-evaluation, 97750- Physical Performance Testing, 97110-Therapeutic exercises, 97530- Therapeutic activity, [id number]- Neuromuscular re-education, 97535- Self Care, 02859- Manual therapy, [phone]- Gait training, [phone]- Aquatic Therapy, [phone]- Electrical stimulation (unattended), Patient/Family education, Balance training, Stair training, Taping, DME instructions, Cryotherapy, and Moist heat  PLAN FOR NEXT SESSION: Work on [employer].  LE strengthening and balance activities.  Work on step ups and step downs next visit.    Leigh Minerva III PT, DPT 07/25/24 12:55 PM

## 2024-07-29 ENCOUNTER — Ambulatory Visit (HOSPITAL_BASED_OUTPATIENT_CLINIC_OR_DEPARTMENT_OTHER): Admitting: Physical Therapy

## 2024-07-29 ENCOUNTER — Encounter (HOSPITAL_BASED_OUTPATIENT_CLINIC_OR_DEPARTMENT_OTHER): Payer: Self-pay | Admitting: Physical Therapy

## 2024-07-29 DIAGNOSIS — R2689 Other abnormalities of gait and mobility: Secondary | ICD-10-CM | POA: Diagnosis not present

## 2024-07-29 DIAGNOSIS — M6281 Muscle weakness (generalized): Secondary | ICD-10-CM

## 2024-07-29 DIAGNOSIS — R2681 Unsteadiness on feet: Secondary | ICD-10-CM

## 2024-07-29 NOTE — Therapy (Signed)
 OUTPATIENT PHYSICAL THERAPY LOWER EXTREMITY TREATMENT        Patient Name: Hailey Perkins MRN: [id number] DOB:03/02/1935, 89 y.o., female Today's Date: 07/30/2024  END OF SESSION:  PT End of Session - 07/29/24 1330     Visit Number 17    Number of Visits 21    Date for PT Re-Evaluation 08/08/24    Authorization Type BCBS MCR    PT Start Time 1326    PT Stop Time 1408    PT Time Calculation (min) 42 min    Activity Tolerance Patient tolerated treatment well    Behavior During Therapy WFL for tasks assessed/performed               Past Medical History:  Diagnosis Date   Aortic atherosclerosis (HCC)    Arthritis    Cataract 2010   bilateral eyes   Chicken pox    Cholelithiasis    Diverticulitis    Diverticulosis    GERD (gastroesophageal reflux disease)    Glaucoma    Hiatal hernia    History of frequent urinary tract infections    Hyperlipidemia    Osteopenia    Peripheral neuropathy    Sleep apnea    wears a CPAP   Urine incontinence    Past Surgical History:  Procedure Laterality Date   ABDOMINAL HYSTERECTOMY  1977   ANAL FISTULECTOMY  1985   ANKLE FRACTURE SURGERY Right 2014   APPENDECTOMY     BIOPSY  02/15/2021   Procedure: BIOPSY;  Surgeon: Wilhelmenia Aloha Raddle., MD;  Location: [facility];  Service: Gastroenterology;;   BREAST BIOPSY Bilateral 10+ yrs ago   BENIGN   BUNIONECTOMY Left 1975   Left Toe   CATARACT EXTRACTION, BILATERAL  2009   COLONOSCOPY WITH PROPOFOL  N/A 02/15/2021   Procedure: COLONOSCOPY WITH PROPOFOL ;  Surgeon: Wilhelmenia Aloha Raddle., MD;  Location: [facility];  Service: Gastroenterology;  Laterality: N/A;  ultraslimscope    DILATION AND CURETTAGE OF UTERUS  1972   eye PRK vision correction Left 10/09/2013   eye socket re-formed Left 2014   FOOT NEUROMA SURGERY Right 2003   plus bunionectomy, right great toe   HAMMER TOE SURGERY Right 2005   Right foot correction hammertoe little toe   HEMORRHOID SURGERY  1967    HIP SURGERY Right 2014   right hip femur pin implanted   KNEE ARTHROSCOPY Left 2010   debridement left knee scar tissue   LAPAROSCOPIC OOPHERECTOMY     1986, 1990   LASIK  1998   NEUROMA SURGERY Right 2003   Right Foot Neuroma, plus Bunionectomy, Right Great Toe   POLYPECTOMY  02/15/2021   Procedure: POLYPECTOMY;  Surgeon: Wilhelmenia Aloha Raddle., MD;  Location: [facility];  Service: Gastroenterology;;   RECTOCELE REPAIR  1968   REPLACEMENT TOTAL KNEE Left 01/2008   REPLACEMENT TOTAL KNEE Right 08/2008   retinal pucker correction Left 07/14/2012   schwannoma tumor (benign) removal  2011   right rib   SIGMOIDOSCOPY     TONSILLECTOMY AND ADENOIDECTOMY     VENTRAL HERNIA REPAIR  1993   Patient Active Problem List   Diagnosis Date Noted   Arthralgia of right elbow 08/24/2022   Bilateral wrist pain 08/24/2022   Neck pain 08/24/2022   Pain of right hip joint 08/24/2022   Chronic pain of right knee 05/27/2021   OSA on CPAP 05/27/2021   LLQ pain 12/16/2020   History of diverticulitis 12/16/2020   Hematochezia 12/16/2020   Abnormal colonoscopy 12/16/2020  Acquired trigger finger of right index finger 10/26/2020   Other specified arthritis, right hand 10/26/2020   Diverticulitis of colon 08/15/2020   Dry eyes, bilateral 10/10/2019   Sleep-related hypoxia 08/05/2019   OSA (obstructive sleep apnea) 08/05/2019   PLMD (periodic limb movement disorder) 08/05/2019   Cerebellar 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) 05/06/2019   Excessive daytime sleepiness 05/06/2019   Loud snoring 05/06/2019   Ventral hernia without obstruction or gangrene 02/22/2019   Chronic throat clearing 01/18/2019   Nocturnal cough 01/18/2019   Family history of colon cancer in father 01/18/2019   Diverticulosis 01/18/2019   Gait abnormality 12/27/2018   Numbness 12/27/2018   Status post total bilateral knee replacement 11/30/2018   DDD (degenerative disc disease), cervical 11/30/2018   Status  post carpal tunnel release 11/30/2018   DDD (degenerative disc disease), lumbar 10/26/2018   Primary osteoarthritis of both hands 10/26/2018   Primary osteoarthritis of both feet 10/26/2018   Gastroesophageal reflux disease 08/27/2018   Bilateral impacted cerumen 08/21/2018   Sensorineural hearing loss (SNHL) of both ears 08/21/2018   Tinnitus, bilateral 08/21/2018   Blood in urine 04/17/2018   Secondary open-angle glaucoma of left eye, moderate stage 03/10/2018   Early stage 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 10/05/2017   Epiretinal membrane (ERM) of left eye 10/05/2017   Pseudophakia of both eyes 10/05/2017   Osteopenia 03/19/2017   Mixed hyperlipidemia 08/18/2016   Generalized osteoarthritis of multiple sites 08/18/2016   Peripheral neuropathic pain 08/18/2016   Overactive bladder 08/18/2016   Glaucoma 08/18/2016   Cervical disc disorder with radiculopathy of cervical region 08/17/2016   Carpal tunnel syndrome, right upper limb 08/17/2016   Abnormal finding on mammography 06/14/2016   Breast lump 05/06/2015    PCP: Yolande Toribio MATSU, MD  REFERRING PROVIDER: Yolande Toribio MATSU, MD  REFERRING DIAG: R26.81 (ICD-10-CM) - Unsteadiness on feet/Unsteady gait  THERAPY DIAG:  Other abnormalities of gait and mobility  Unsteadiness on feet  Muscle weakness (generalized)  Rationale for Evaluation and Treatment: Rehabilitation  ONSET DATE: 2021 ; PT order 04/04/2024  SUBJECTIVE:   SUBJECTIVE STATEMENT: Pt states she felt fine after prior Rx, just tired.  Pt had her aquatic class this AM.  Pt states she had some pain at lateral heel at the beginning of the aquatic class today.  Pt states the chiropractor must have helped, because she has no c/o's in her hip since.  Pt denies pain currently.        PERTINENT HISTORY: Pt uses a SPC with wlaking.  Flexor tenotomy on 3 toes of R foot on 05/01/24--Pt reports she has no restrictions from MD. Arthritis,  osteopenia, peripheral neuropathy Bilat TKR  PAIN:  No pain  PRECAUTIONS: Fall and Other: peripheral neuropathy   WEIGHT BEARING RESTRICTIONS: No  FALLS:  Has patient fallen in last 6 months? No  LIVING ENVIRONMENT: Lives with: lives alone Lives in:  1 story home Stairs: No Has following equipment at home: Single point cane, shower chair, and Grab bars  OCCUPATION:  Retired  PLOF: Independent  PATIENT GOALS: improved ability to pivot.  Improved walking on uneven terrain and performing stairs   OBJECTIVE:  Note: Objective measures were completed at Evaluation unless otherwise noted.  DIAGNOSTIC FINDINGS: N/A  TREATMENT DATE:    07/24/2024:  Therapeutic Exercise: Nustep L5 UE/LEs x 5 mins LAQ with GTB 2x10 bilat Ankle DF with RTB 3x10 L, YTB 3x10 R   Therapeutic Activities: Step ups on 1st step with rail 2x5 reps L, and x 3 reps on R Step ups on 6 inch step with rail x5 reps Lateral step ups 4 inch step with rail 2x10 R LE, x 7 L LE  Neuro Re-ed Activities: Staggered standing  2x30 sec with L LE back without UE support with SBA and R LE back 2x30 sec with CGA and occsaional support  Half tandem stance with L LE back x 20 sec with occasional rail support, x 30 sec with R LE back without UE support with SBA Braiding with UE support on rail with SBA Tandem gait with UE support on rail with SBA    PATIENT EDUCATION:  Education details:  HEP, rationale of interventions, POC, dx, exercise form, and relevant anatomy.  PT answered pt's questions.   Person educated: Patient Education method: Explanation, demonstration, verbal cues Education comprehension: verbalized understanding and needs further education, verbal cues required, returned demonstration  HOME EXERCISE PROGRAM: Access Code: [email or web address] URL:  [email or web address] Date: 05/21/2024 Prepared by: Mose Minerva  Exercises - Seated March  - 1 x daily - 7 x weekly - 2 sets - 10 reps - Seated Ankle Dorsiflexion AROM  - 1 x daily - 7 x weekly - 2 sets - 10 reps - Sit to Stand  - 1 x daily - 5-6 x weekly - 2 sets - 5-8 reps - Side Stepping with Counter Support  - 1 x daily - 7 x weekly - 2 sets - 5-10 reps  ASSESSMENT:  CLINICAL IMPRESSION: PT performed exercises to improve LE and functional strength, balance, mobility, and proprioception.  Pt had discomfort with stair step ups on R LE and PT decreased height of step ups.  Pt performed 2nd set of step ups with R LE on a 6 inch step.  PT had pt stop lateral step ups to the L due to R lateral ankle and heel pain.  Pt performed staggered standing and had greater challenge with R LE back.  Pt requires UE support on rail with braiding and tandem gait.  Pt responded well to reporting no pain after Rx.  She should benefit from cont skilled PT to address ongoing goals, improve balance and strength, reduce fall risk, and maximize functional mobility.      OBJECTIVE IMPAIRMENTS: Abnormal gait, decreased activity tolerance, decreased balance, decreased endurance, decreased mobility, difficulty walking, and decreased strength.   ACTIVITY LIMITATIONS: standing, squatting, stairs, transfers, and locomotion level  PARTICIPATION LIMITATIONS: cleaning and community activity  PERSONAL FACTORS: 3+ comorbidities: Arthritis, bilat TKA, peripheral neuropathy are also affecting patient's functional outcome.   REHAB POTENTIAL: Good  CLINICAL DECISION MAKING: Stable/uncomplicated  EVALUATION COMPLEXITY: Low   GOALS:   SHORT TERM GOALS: Target date: 06/05/2024  Pt will be independent and compliant with HEP for improved strength, balance, stability, and mobility.  Baseline: Goal status: GOAL MET 06/27/24  2.  Pt will demo improved time on TUG test by at least 5 seconds for improved  mobility and reduced fall risk.   Baseline:  Goal status: 80% MET  3.  Pt will report at least a 25% improvement in her balance.   Baseline:  Goal status: GOAL MET  06/27/24    LONG TERM GOALS: Target date:  08/08/2024   Pt will score at least 44/56  on the [facility] Assessment for improved balance and stability with daily tasks and mobility.  Baseline:  35/56 Goal status: INITIAL--Pt completed Berg Balance Test     2.  Pt will be able to load and unload her dishwasher with good stability and good confidence.  Baseline:  Goal status: PROGRESSING  3.  Pt will demo improved bilat LE strength to 4+/5 in hip flexion, 5/5 in knee extension, 4-/5 in R ankle DF, and a 5# increase in L hip abduction for improved performance of functional mobility and performance of household chores.  Baseline:  Goal status: PROGRESSING  4.  Pt will be able to ascend and descend stairs with a reciprocal gait with the rail.   Baseline:  Goal status: ONGOING  5.  Pt will report she is able to perform her normal ambulation with cane with good stability and confidence and without LOB. Baseline:  Goal status:ONGOING   PLAN:  PT FREQUENCY: 2x/week  PT DURATION: 6 weeks  PLANNED INTERVENTIONS: 02835- PT Re-evaluation, 97750- Physical Performance Testing, 97110-Therapeutic exercises, 97530- Therapeutic activity, [id number]- Neuromuscular re-education, 97535- Self Care, 02859- Manual therapy, [phone]- Gait training, [phone]- Aquatic Therapy, [phone]- Electrical stimulation (unattended), Patient/Family education, Balance training, Stair training, Taping, DME instructions, Cryotherapy, and Moist heat  PLAN FOR NEXT SESSION: Work on [employer].  LE strengthening and balance activities.  Work on step ups and step downs next visit.    Leigh Minerva III PT, DPT 07/30/24 6:26 PM

## 2024-07-31 ENCOUNTER — Ambulatory Visit (HOSPITAL_BASED_OUTPATIENT_CLINIC_OR_DEPARTMENT_OTHER): Admitting: Physical Therapy

## 2024-07-31 ENCOUNTER — Encounter (HOSPITAL_BASED_OUTPATIENT_CLINIC_OR_DEPARTMENT_OTHER): Payer: Self-pay | Admitting: Physical Therapy

## 2024-07-31 DIAGNOSIS — R2681 Unsteadiness on feet: Secondary | ICD-10-CM

## 2024-07-31 DIAGNOSIS — R2689 Other abnormalities of gait and mobility: Secondary | ICD-10-CM | POA: Diagnosis not present

## 2024-07-31 DIAGNOSIS — M6281 Muscle weakness (generalized): Secondary | ICD-10-CM

## 2024-07-31 NOTE — Therapy (Signed)
 OUTPATIENT PHYSICAL THERAPY LOWER EXTREMITY TREATMENT        Patient Name: Hailey Perkins MRN: [id number] DOB:[birth year], 89 y.o., female Today's Date: 08/01/2024  END OF SESSION:  PT End of Session - 07/31/24 1328     Visit Number 18    Number of Visits 21    Date for PT Re-Evaluation 08/08/24    Authorization Type BCBS MCR    PT Start Time 1322    PT Stop Time 1406    PT Time Calculation (min) 44 min    Activity Tolerance Patient tolerated treatment well    Behavior During Therapy WFL for tasks assessed/performed               Past Medical History:  Diagnosis Date   Aortic atherosclerosis (HCC)    Arthritis    Cataract 2010   bilateral eyes   Chicken pox    Cholelithiasis    Diverticulitis    Diverticulosis    GERD (gastroesophageal reflux disease)    Glaucoma    Hiatal hernia    History of frequent urinary tract infections    Hyperlipidemia    Osteopenia    Peripheral neuropathy    Sleep apnea    wears a CPAP   Urine incontinence    Past Surgical History:  Procedure Laterality Date   ABDOMINAL HYSTERECTOMY  1977   ANAL FISTULECTOMY  1985   ANKLE FRACTURE SURGERY Right 2014   APPENDECTOMY     BIOPSY  02/15/2021   Procedure: BIOPSY;  Surgeon: Wilhelmenia Aloha Raddle., MD;  Location: [facility];  Service: Gastroenterology;;   BREAST BIOPSY Bilateral 10+ yrs ago   BENIGN   BUNIONECTOMY Left 1975   Left Toe   CATARACT EXTRACTION, BILATERAL  2009   COLONOSCOPY WITH PROPOFOL  N/A 02/15/2021   Procedure: COLONOSCOPY WITH PROPOFOL ;  Surgeon: Wilhelmenia Aloha Raddle., MD;  Location: [facility];  Service: Gastroenterology;  Laterality: N/A;  ultraslimscope    DILATION AND CURETTAGE OF UTERUS  1972   eye PRK vision correction Left 10/09/2013   eye socket re-formed Left 2014   FOOT NEUROMA SURGERY Right 2003   plus bunionectomy, right great toe   HAMMER TOE SURGERY Right 2005   Right foot correction hammertoe little toe   HEMORRHOID SURGERY  1967    HIP SURGERY Right 2014   right hip femur pin implanted   KNEE ARTHROSCOPY Left 2010   debridement left knee scar tissue   LAPAROSCOPIC OOPHERECTOMY     1986, 1990   LASIK  1998   NEUROMA SURGERY Right 2003   Right Foot Neuroma, plus Bunionectomy, Right Great Toe   POLYPECTOMY  02/15/2021   Procedure: POLYPECTOMY;  Surgeon: Wilhelmenia Aloha Raddle., MD;  Location: [facility];  Service: Gastroenterology;;   RECTOCELE REPAIR  1968   REPLACEMENT TOTAL KNEE Left 01/2008   REPLACEMENT TOTAL KNEE Right 08/2008   retinal pucker correction Left 07/14/2012   schwannoma tumor (benign) removal  2011   right rib   SIGMOIDOSCOPY     TONSILLECTOMY AND ADENOIDECTOMY     VENTRAL HERNIA REPAIR  1993   Patient Active Problem List   Diagnosis Date Noted   Arthralgia of right elbow 08/24/2022   Bilateral wrist pain 08/24/2022   Neck pain 08/24/2022   Pain of right hip joint 08/24/2022   Chronic pain of right knee 05/27/2021   OSA on CPAP 05/27/2021   LLQ pain 12/16/2020   History of diverticulitis 12/16/2020   Hematochezia 12/16/2020   Abnormal colonoscopy 12/16/2020  Acquired trigger finger of right index finger 10/26/2020   Other specified arthritis, right hand 10/26/2020   Diverticulitis of colon 08/15/2020   Dry eyes, bilateral 10/10/2019   Sleep-related hypoxia 08/05/2019   OSA (obstructive sleep apnea) 08/05/2019   PLMD (periodic limb movement disorder) 08/05/2019   Cerebellar 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) 05/06/2019   Excessive daytime sleepiness 05/06/2019   Loud snoring 05/06/2019   Ventral hernia without obstruction or gangrene 02/22/2019   Chronic throat clearing 01/18/2019   Nocturnal cough 01/18/2019   Family history of colon cancer in father 01/18/2019   Diverticulosis 01/18/2019   Gait abnormality 12/27/2018   Numbness 12/27/2018   Status post total bilateral knee replacement 11/30/2018   DDD (degenerative disc disease), cervical 11/30/2018   Status  post carpal tunnel release 11/30/2018   DDD (degenerative disc disease), lumbar 10/26/2018   Primary osteoarthritis of both hands 10/26/2018   Primary osteoarthritis of both feet 10/26/2018   Gastroesophageal reflux disease 08/27/2018   Bilateral impacted cerumen 08/21/2018   Sensorineural hearing loss (SNHL) of both ears 08/21/2018   Tinnitus, bilateral 08/21/2018   Blood in urine 04/17/2018   Secondary open-angle glaucoma of left eye, moderate stage 03/10/2018   Early stage 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 10/05/2017   Epiretinal membrane (ERM) of left eye 10/05/2017   Pseudophakia of both eyes 10/05/2017   Osteopenia 03/19/2017   Mixed hyperlipidemia 08/18/2016   Generalized osteoarthritis of multiple sites 08/18/2016   Peripheral neuropathic pain 08/18/2016   Overactive bladder 08/18/2016   Glaucoma 08/18/2016   Cervical disc disorder with radiculopathy of cervical region 08/17/2016   Carpal tunnel syndrome, right upper limb 08/17/2016   Abnormal finding on mammography 06/14/2016   Breast lump 05/06/2015    PCP: Yolande Toribio MATSU, MD  REFERRING PROVIDER: Yolande Toribio MATSU, MD  REFERRING DIAG: R26.81 (ICD-10-CM) - Unsteadiness on feet/Unsteady gait  THERAPY DIAG:  Other abnormalities of gait and mobility  Unsteadiness on feet  Muscle weakness (generalized)  Rationale for Evaluation and Treatment: Rehabilitation  ONSET DATE: 2021 ; PT order 04/04/2024  SUBJECTIVE:   SUBJECTIVE STATEMENT: Pt states she felt fine after prior Rx.  Pt had her aquatic class this AM.  Pt states she changed shoes to see if that would help her heel.  Pt denies pain currently.       PERTINENT HISTORY: Pt uses a SPC with wlaking.  Flexor tenotomy on 3 toes of R foot on 05/01/24--Pt reports she has no restrictions from MD. Arthritis, osteopenia, peripheral neuropathy Bilat TKR  PAIN:  No pain  PRECAUTIONS: Fall and Other: peripheral neuropathy   WEIGHT  BEARING RESTRICTIONS: No  FALLS:  Has patient fallen in last 6 months? No  LIVING ENVIRONMENT: Lives with: lives alone Lives in:  1 story home Stairs: No Has following equipment at home: Single point cane, shower chair, and Grab bars  OCCUPATION:  Retired  PLOF: Independent  PATIENT GOALS: improved ability to pivot.  Improved walking on uneven terrain and performing stairs   OBJECTIVE:  Note: Objective measures were completed at Evaluation unless otherwise noted.  DIAGNOSTIC FINDINGS: N/A  TREATMENT DATE:    07/24/2024:  Therapeutic Exercise: Nustep L5 UE/LEs x 5 mins Lateral band walks with RTB around thighs x 2 laps Ankle DF with RTB 3x10 L, YTB 3x10 R Lateral step ups 2x10 bilat4 inch step with rail 2x10 R LE, x 7 L LE  Neuro Re-ed Activities: Staggered standing 2x30 sec bilat without UE support except one occasion of UE support with R LE back  Half tandem stance 2x30 sec bilat with occasional rail support and SBA/CGA Braiding with UE support on rail with SBA/CGA Tandem gait with UE support on rail with SBA Marching on airex with and without UE support with min assist  Heel raises on airex 2x10 Head turns while standing on airex x 10 reps with min assist for LOB    PATIENT EDUCATION:  Education details:  HEP, rationale of interventions, POC, dx, exercise form, and relevant anatomy.  PT answered pt's questions.   Person educated: Patient Education method: Explanation, demonstration, verbal cues Education comprehension: verbalized understanding and needs further education, verbal cues required, returned demonstration  HOME EXERCISE PROGRAM: Access Code: [email or web address] URL: [email or web address] Date: 05/21/2024 Prepared by: Mose Minerva  Exercises - Seated March  - 1 x daily - 7 x weekly - 2 sets - 10 reps - Seated Ankle  Dorsiflexion AROM  - 1 x daily - 7 x weekly - 2 sets - 10 reps - Sit to Stand  - 1 x daily - 5-6 x weekly - 2 sets - 5-8 reps - Side Stepping with Counter Support  - 1 x daily - 7 x weekly - 2 sets - 5-10 reps  ASSESSMENT:  CLINICAL IMPRESSION: PT performed exercises to improve LE and functional strength, balance, mobility, and proprioception.  Pt gives great effort with all exercises and activities.  Pt had no heel or ankle pain with lateral step ups.  Pt demonstrates improved balance with staggered standing today.  She requires UE support on rail with braiding and tandem gait.  PT added band to LE's with sidestepping.  She tolerated treatment well and had no c/o's after Rx.  She should benefit from cont skilled PT to address ongoing goals, improve balance and strength, reduce fall risk, and maximize functional mobility.      OBJECTIVE IMPAIRMENTS: Abnormal gait, decreased activity tolerance, decreased balance, decreased endurance, decreased mobility, difficulty walking, and decreased strength.   ACTIVITY LIMITATIONS: standing, squatting, stairs, transfers, and locomotion level  PARTICIPATION LIMITATIONS: cleaning and community activity  PERSONAL FACTORS: 3+ comorbidities: Arthritis, bilat TKA, peripheral neuropathy are also affecting patient's functional outcome.   REHAB POTENTIAL: Good  CLINICAL DECISION MAKING: Stable/uncomplicated  EVALUATION COMPLEXITY: Low   GOALS:   SHORT TERM GOALS: Target date: 06/05/2024  Pt will be independent and compliant with HEP for improved strength, balance, stability, and mobility.  Baseline: Goal status: GOAL MET 06/27/24  2.  Pt will demo improved time on TUG test by at least 5 seconds for improved mobility and reduced fall risk.   Baseline:  Goal status: 80% MET  3.  Pt will report at least a 25% improvement in her balance.   Baseline:  Goal status: GOAL MET  06/27/24    LONG TERM GOALS: Target date:  08/08/2024   Pt will score at  least 44/56 on the Berg Balance Assessment for improved balance and stability with daily tasks and mobility.  Baseline:  35/56 Goal status: INITIAL--Pt completed Berg Balance Test     2.  Pt will be able to load and unload  her dishwasher with good stability and good confidence.  Baseline:  Goal status: PROGRESSING  3.  Pt will demo improved bilat LE strength to 4+/5 in hip flexion, 5/5 in knee extension, 4-/5 in R ankle DF, and a 5# increase in L hip abduction for improved performance of functional mobility and performance of household chores.  Baseline:  Goal status: PROGRESSING  4.  Pt will be able to ascend and descend stairs with a reciprocal gait with the rail.   Baseline:  Goal status: ONGOING  5.  Pt will report she is able to perform her normal ambulation with cane with good stability and confidence and without LOB. Baseline:  Goal status:ONGOING   PLAN:  PT FREQUENCY: 2x/week  PT DURATION: 6 weeks  PLANNED INTERVENTIONS: 02835- PT Re-evaluation, 97750- Physical Performance Testing, 97110-Therapeutic exercises, 97530- Therapeutic activity, [id number]- Neuromuscular re-education, 97535- Self Care, 02859- Manual therapy, [phone]- Gait training, [phone]- Aquatic Therapy, [phone]- Electrical stimulation (unattended), Patient/Family education, Balance training, Stair training, Taping, DME instructions, Cryotherapy, and Moist heat  PLAN FOR NEXT SESSION: Work on [employer].  LE strengthening and balance activities.  Work on step ups and step downs next visit.    Leigh Minerva III PT, DPT 08/01/24 11:19 PM

## 2024-08-05 NOTE — Therapy (Signed)
 OUTPATIENT PHYSICAL THERAPY LOWER EXTREMITY TREATMENT        Patient Name: Hailey Perkins MRN: [id number] DOB:1935-04-21, 89 y.o., female Today's Date: 08/06/2024  END OF SESSION:  PT End of Session - 08/06/24 1325     Visit Number 19    Number of Visits 21    Date for PT Re-Evaluation 08/08/24    Authorization Type BCBS MCR    PT Start Time 1322    PT Stop Time 1412    PT Time Calculation (min) 50 min    Activity Tolerance Patient tolerated treatment well    Behavior During Therapy WFL for tasks assessed/performed                Past Medical History:  Diagnosis Date   Aortic atherosclerosis (HCC)    Arthritis    Cataract 2010   bilateral eyes   Chicken pox    Cholelithiasis    Diverticulitis    Diverticulosis    GERD (gastroesophageal reflux disease)    Glaucoma    Hiatal hernia    History of frequent urinary tract infections    Hyperlipidemia    Osteopenia    Peripheral neuropathy    Sleep apnea    wears a CPAP   Urine incontinence    Past Surgical History:  Procedure Laterality Date   ABDOMINAL HYSTERECTOMY  1977   ANAL FISTULECTOMY  1985   ANKLE FRACTURE SURGERY Right 2014   APPENDECTOMY     BIOPSY  02/15/2021   Procedure: BIOPSY;  Surgeon: Wilhelmenia Aloha Raddle., MD;  Location: [facility];  Service: Gastroenterology;;   BREAST BIOPSY Bilateral 10+ yrs ago   BENIGN   BUNIONECTOMY Left 1975   Left Toe   CATARACT EXTRACTION, BILATERAL  2009   COLONOSCOPY WITH PROPOFOL  N/A 02/15/2021   Procedure: COLONOSCOPY WITH PROPOFOL ;  Surgeon: Wilhelmenia Aloha Raddle., MD;  Location: [facility];  Service: Gastroenterology;  Laterality: N/A;  ultraslimscope    DILATION AND CURETTAGE OF UTERUS  1972   eye PRK vision correction Left 10/09/2013   eye socket re-formed Left 2014   FOOT NEUROMA SURGERY Right 2003   plus bunionectomy, right great toe   HAMMER TOE SURGERY Right 2005   Right foot correction hammertoe little toe   HEMORRHOID SURGERY  1967    HIP SURGERY Right 2014   right hip femur pin implanted   KNEE ARTHROSCOPY Left 2010   debridement left knee scar tissue   LAPAROSCOPIC OOPHERECTOMY     1986, 1990   LASIK  1998   NEUROMA SURGERY Right 2003   Right Foot Neuroma, plus Bunionectomy, Right Great Toe   POLYPECTOMY  02/15/2021   Procedure: POLYPECTOMY;  Surgeon: Wilhelmenia Aloha Raddle., MD;  Location: [facility];  Service: Gastroenterology;;   RECTOCELE REPAIR  1968   REPLACEMENT TOTAL KNEE Left 01/2008   REPLACEMENT TOTAL KNEE Right 08/2008   retinal pucker correction Left 07/14/2012   schwannoma tumor (benign) removal  2011   right rib   SIGMOIDOSCOPY     TONSILLECTOMY AND ADENOIDECTOMY     VENTRAL HERNIA REPAIR  1993   Patient Active Problem List   Diagnosis Date Noted   Arthralgia of right elbow 08/24/2022   Bilateral wrist pain 08/24/2022   Neck pain 08/24/2022   Pain of right hip joint 08/24/2022   Chronic pain of right knee 05/27/2021   OSA on CPAP 05/27/2021   LLQ pain 12/16/2020   History of diverticulitis 12/16/2020   Hematochezia 12/16/2020   Abnormal colonoscopy  12/16/2020   Acquired trigger finger of right index finger 10/26/2020   Other specified arthritis, right hand 10/26/2020   Diverticulitis of colon 08/15/2020   Dry eyes, bilateral 10/10/2019   Sleep-related hypoxia 08/05/2019   OSA (obstructive sleep apnea) 08/05/2019   PLMD (periodic limb movement disorder) 08/05/2019   Cerebellar 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) 05/06/2019   Excessive daytime sleepiness 05/06/2019   Loud snoring 05/06/2019   Ventral hernia without obstruction or gangrene 02/22/2019   Chronic throat clearing 01/18/2019   Nocturnal cough 01/18/2019   Family history of colon cancer in father 01/18/2019   Diverticulosis 01/18/2019   Gait abnormality 12/27/2018   Numbness 12/27/2018   Status post total bilateral knee replacement 11/30/2018   DDD (degenerative disc disease), cervical 11/30/2018   Status  post carpal tunnel release 11/30/2018   DDD (degenerative disc disease), lumbar 10/26/2018   Primary osteoarthritis of both hands 10/26/2018   Primary osteoarthritis of both feet 10/26/2018   Gastroesophageal reflux disease 08/27/2018   Bilateral impacted cerumen 08/21/2018   Sensorineural hearing loss (SNHL) of both ears 08/21/2018   Tinnitus, bilateral 08/21/2018   Blood in urine 04/17/2018   Secondary open-angle glaucoma of left eye, moderate stage 03/10/2018   Early stage 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 10/05/2017   Epiretinal membrane (ERM) of left eye 10/05/2017   Pseudophakia of both eyes 10/05/2017   Osteopenia 03/19/2017   Mixed hyperlipidemia 08/18/2016   Generalized osteoarthritis of multiple sites 08/18/2016   Peripheral neuropathic pain 08/18/2016   Overactive bladder 08/18/2016   Glaucoma 08/18/2016   Cervical disc disorder with radiculopathy of cervical region 08/17/2016   Carpal tunnel syndrome, right upper limb 08/17/2016   Abnormal finding on mammography 06/14/2016   Breast lump 05/06/2015    PCP: Yolande Toribio MATSU, MD  REFERRING PROVIDER: Yolande Toribio MATSU, MD  REFERRING DIAG: R26.81 (ICD-10-CM) - Unsteadiness on feet/Unsteady gait  THERAPY DIAG:  Other abnormalities of gait and mobility  Unsteadiness on feet  Muscle weakness (generalized)  Rationale for Evaluation and Treatment: Rehabilitation  ONSET DATE: 2021 ; PT order 04/04/2024  SUBJECTIVE:   SUBJECTIVE STATEMENT: Pt states she woke up and her L knee was bothering her today.  Pt states it feels better now.  Pt denies any mechanism of injury.  Pt denies any adverse effects after prior Rx.  Pt states she almost lost her balance when turning her head talking to someone in her garage.  Pt states the exercises have helped her.  Pt states she feels that she needs to continue with PT.       PERTINENT HISTORY: Pt uses a SPC with wlaking.  Flexor tenotomy on 3 toes of R foot  on 05/01/24--Pt reports she has no restrictions from MD. Arthritis, osteopenia, peripheral neuropathy Bilat TKR  PAIN:  1/10 L knee pain currently though was a 4/10 this AM  PRECAUTIONS: Fall and Other: peripheral neuropathy   WEIGHT BEARING RESTRICTIONS: No  FALLS:  Has patient fallen in last 6 months? No  LIVING ENVIRONMENT: Lives with: lives alone Lives in:  1 story home Stairs: No Has following equipment at home: Single point cane, shower chair, and Grab bars  OCCUPATION:  Retired  PLOF: Independent  PATIENT GOALS: improved ability to pivot.  Improved walking on uneven terrain and performing stairs   OBJECTIVE:  Note: Objective measures were completed at Evaluation unless otherwise noted.  DIAGNOSTIC FINDINGS: N/A  TREATMENT DATE:    08/06/2024:  Therapeutic Exercise: Nustep L5 UE/LEs x 5 mins Seated clams with GTB 2x10 Sit to stands with GTB around thighs 2x10 Seated toe raises 2x10 Seated heel raises 2x10 Standing heel raises x10 on floor and x10 on airex  PT extensively reviewed HEP and updated HEP.  Pt received a HEP handout and was educated in correct form and appropriate frequency.  PT instructed pt in appropriate usage of hands on sturdy supports such as a counter or table for balance and support.   Neuro Re-ed Activities: Staggered standing 2x30 sec bilat without UE support primarily though did occasionally use UE support Marching on airex with and without UE support with min assist, x 10 reps with UE support on rail independently Head turns while standing on airex x 10 reps with min assist for LOB and x 10 reps on the floor Ambulating over 3 hurdles with a reciprocal gait with the rail    PATIENT EDUCATION:  Education details:  HEP, rationale of interventions, POC, dx, exercise form, and relevant anatomy.  PT answered pt's  questions.   Person educated: Patient Education method: Explanation, demonstration, verbal cues Education comprehension: verbalized understanding and needs further education, verbal cues required, returned demonstration  HOME EXERCISE PROGRAM: Access Code: [email or web address] URL: [email or web address] Date: 05/21/2024 Prepared by: Mose Minerva  Exercises - Seated March  - 1 x daily - 7 x weekly - 2 sets - 10 reps - Seated Ankle Dorsiflexion AROM  - 1 x daily - 7 x weekly - 2 sets - 10 reps - Sit to Stand  - 1 x daily - 5-6 x weekly - 2 sets - 5-8 reps - Side Stepping with Counter Support  - 1 x daily - 7 x weekly - 2 sets - 5-10 reps  Updated HEP: - Standing in a Staggered Stance  - 1 x daily - 7 x weekly - 2 reps - 20 - 30 seconds hold - Standing March on airex with Counter Support  - 1 x daily - 5-6 x weekly - 2 sets - 10 reps - Seated Heel Raise  - 1 x daily - 7 x weekly - 2 sets - 10 reps  ASSESSMENT:  CLINICAL IMPRESSION: PT extensively reviewed HEP and updated HEP.  PT gave pt a HEP handout and pt demonstrates good understanding.  Pt has limited height with standing heel raises.  PT added seated heel raises to HEP.  Pt had much difficulty and LOB with head turns while standing on an airex requiring UE support and PT assistance.  Pt had much improved balance when performing head turns while standing on the floor.  PT provided minimal cuing for positioning with sit to stands with band around thighs.  She responded well to treatment and had no c/o's after treatment.  Pt denies knee pain after treatment.       OBJECTIVE IMPAIRMENTS: Abnormal gait, decreased activity tolerance, decreased balance, decreased endurance, decreased mobility, difficulty walking, and decreased strength.   ACTIVITY LIMITATIONS: standing, squatting, stairs, transfers, and locomotion level  PARTICIPATION LIMITATIONS: cleaning and community activity  PERSONAL FACTORS: 3+ comorbidities: Arthritis, bilat  TKA, peripheral neuropathy are also affecting patient's functional outcome.   REHAB POTENTIAL: Good  CLINICAL DECISION MAKING: Stable/uncomplicated  EVALUATION COMPLEXITY: Low   GOALS:   SHORT TERM GOALS: Target date: 06/05/2024  Pt will be independent and compliant with HEP for improved strength, balance, stability, and mobility.  Baseline: Goal status: GOAL MET 06/27/24  2.  Pt will  demo improved time on TUG test by at least 5 seconds for improved mobility and reduced fall risk.   Baseline:  Goal status: 80% MET  3.  Pt will report at least a 25% improvement in her balance.   Baseline:  Goal status: GOAL MET  06/27/24    LONG TERM GOALS: Target date:  08/08/2024   Pt will score at least 44/56 on the Berg Balance Assessment for improved balance and stability with daily tasks and mobility.  Baseline:  35/56 Goal status: INITIAL--Pt completed Berg Balance Test     2.  Pt will be able to load and unload her dishwasher with good stability and good confidence.  Baseline:  Goal status: PROGRESSING  3.  Pt will demo improved bilat LE strength to 4+/5 in hip flexion, 5/5 in knee extension, 4-/5 in R ankle DF, and a 5# increase in L hip abduction for improved performance of functional mobility and performance of household chores.  Baseline:  Goal status: PROGRESSING  4.  Pt will be able to ascend and descend stairs with a reciprocal gait with the rail.   Baseline:  Goal status: ONGOING  5.  Pt will report she is able to perform her normal ambulation with cane with good stability and confidence and without LOB. Baseline:  Goal status:ONGOING   PLAN:  PT FREQUENCY: 2x/week  PT DURATION: 6 weeks  PLANNED INTERVENTIONS: 02835- PT Re-evaluation, 97750- Physical Performance Testing, 97110-Therapeutic exercises, 97530- Therapeutic activity, [id number]- Neuromuscular re-education, 97535- Self Care, 02859- Manual therapy, [phone]- Gait training, [phone]- Aquatic Therapy, [phone]- Electrical  stimulation (unattended), Patient/Family education, Balance training, Stair training, Taping, DME instructions, Cryotherapy, and Moist heat  PLAN FOR NEXT SESSION: Work on [employer].  LE strengthening and balance activities.  Work on step ups and step downs next visit.  PN next visit.   Leigh Minerva III PT, DPT 08/06/24 2:37 PM

## 2024-08-06 ENCOUNTER — Ambulatory Visit (HOSPITAL_BASED_OUTPATIENT_CLINIC_OR_DEPARTMENT_OTHER): Attending: Internal Medicine | Admitting: Physical Therapy

## 2024-08-06 ENCOUNTER — Encounter (HOSPITAL_BASED_OUTPATIENT_CLINIC_OR_DEPARTMENT_OTHER): Payer: Self-pay | Admitting: Physical Therapy

## 2024-08-06 DIAGNOSIS — R2681 Unsteadiness on feet: Secondary | ICD-10-CM | POA: Diagnosis present

## 2024-08-06 DIAGNOSIS — R2689 Other abnormalities of gait and mobility: Secondary | ICD-10-CM | POA: Diagnosis present

## 2024-08-06 DIAGNOSIS — M6281 Muscle weakness (generalized): Secondary | ICD-10-CM | POA: Insufficient documentation

## 2024-08-08 ENCOUNTER — Ambulatory Visit (HOSPITAL_BASED_OUTPATIENT_CLINIC_OR_DEPARTMENT_OTHER): Admitting: Physical Therapy

## 2024-08-08 DIAGNOSIS — M6281 Muscle weakness (generalized): Secondary | ICD-10-CM

## 2024-08-08 DIAGNOSIS — R2689 Other abnormalities of gait and mobility: Secondary | ICD-10-CM

## 2024-08-08 DIAGNOSIS — R2681 Unsteadiness on feet: Secondary | ICD-10-CM

## 2024-08-08 NOTE — Therapy (Signed)
 OUTPATIENT PHYSICAL THERAPY LOWER EXTREMITY TREATMENT        Patient Name: Hailey Perkins MRN: [id number] DOB:January 21, 1935, 89 y.o., female Today's Date: 08/09/2024  END OF SESSION:  PT End of Session - 08/08/24 1416     Visit Number 20    Number of Visits 25    Date for PT Re-Evaluation 09/19/24    Authorization Type [employer] MCR    PT Start Time 1326    PT Stop Time 1410    PT Time Calculation (min) 44 min    Activity Tolerance Patient tolerated treatment well    Behavior During Therapy WFL for tasks assessed/performed                 Past Medical History:  Diagnosis Date   Aortic atherosclerosis (HCC)    Arthritis    Cataract 2010   bilateral eyes   Chicken pox    Cholelithiasis    Diverticulitis    Diverticulosis    GERD (gastroesophageal reflux disease)    Glaucoma    Hiatal hernia    History of frequent urinary tract infections    Hyperlipidemia    Osteopenia    Peripheral neuropathy    Sleep apnea    wears a CPAP   Urine incontinence    Past Surgical History:  Procedure Laterality Date   ABDOMINAL HYSTERECTOMY  1977   ANAL FISTULECTOMY  1985   ANKLE FRACTURE SURGERY Right 2014   APPENDECTOMY     BIOPSY  02/15/2021   Procedure: BIOPSY;  Surgeon: Wilhelmenia Aloha Raddle., MD;  Location: [facility];  Service: Gastroenterology;;   BREAST BIOPSY Bilateral 10+ yrs ago   BENIGN   BUNIONECTOMY Left 1975   Left Toe   CATARACT EXTRACTION, BILATERAL  2009   COLONOSCOPY WITH PROPOFOL  N/A 02/15/2021   Procedure: COLONOSCOPY WITH PROPOFOL ;  Surgeon: Wilhelmenia Aloha Raddle., MD;  Location: [facility];  Service: Gastroenterology;  Laterality: N/A;  ultraslimscope    DILATION AND CURETTAGE OF UTERUS  1972   eye PRK vision correction Left 10/09/2013   eye socket re-formed Left 2014   FOOT NEUROMA SURGERY Right 2003   plus bunionectomy, right great toe   HAMMER TOE SURGERY Right 2005   Right foot correction hammertoe little toe   HEMORRHOID SURGERY   1967   HIP SURGERY Right 2014   right hip femur pin implanted   KNEE ARTHROSCOPY Left 2010   debridement left knee scar tissue   LAPAROSCOPIC OOPHERECTOMY     1986, 1990   LASIK  1998   NEUROMA SURGERY Right 2003   Right Foot Neuroma, plus Bunionectomy, Right Great Toe   POLYPECTOMY  02/15/2021   Procedure: POLYPECTOMY;  Surgeon: Wilhelmenia Aloha Raddle., MD;  Location: [facility];  Service: Gastroenterology;;   RECTOCELE REPAIR  1968   REPLACEMENT TOTAL KNEE Left 01/2008   REPLACEMENT TOTAL KNEE Right 08/2008   retinal pucker correction Left 07/14/2012   schwannoma tumor (benign) removal  2011   right rib   SIGMOIDOSCOPY     TONSILLECTOMY AND ADENOIDECTOMY     VENTRAL HERNIA REPAIR  1993   Patient Active Problem List   Diagnosis Date Noted   Arthralgia of right elbow 08/24/2022   Bilateral wrist pain 08/24/2022   Neck pain 08/24/2022   Pain of right hip joint 08/24/2022   Chronic pain of right knee 05/27/2021   OSA on CPAP 05/27/2021   LLQ pain 12/16/2020   History of diverticulitis 12/16/2020   Hematochezia 12/16/2020   Abnormal  colonoscopy 12/16/2020   Acquired trigger finger of right index finger 10/26/2020   Other specified arthritis, right hand 10/26/2020   Diverticulitis of colon 08/15/2020   Dry eyes, bilateral 10/10/2019   Sleep-related hypoxia 08/05/2019   OSA (obstructive sleep apnea) 08/05/2019   PLMD (periodic limb movement disorder) 08/05/2019   Cerebellar 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) 05/06/2019   Excessive daytime sleepiness 05/06/2019   Loud snoring 05/06/2019   Ventral hernia without obstruction or gangrene 02/22/2019   Chronic throat clearing 01/18/2019   Nocturnal cough 01/18/2019   Family history of colon cancer in father 01/18/2019   Diverticulosis 01/18/2019   Gait abnormality 12/27/2018   Numbness 12/27/2018   Status post total bilateral knee replacement 11/30/2018   DDD (degenerative disc disease), cervical 11/30/2018    Status post carpal tunnel release 11/30/2018   DDD (degenerative disc disease), lumbar 10/26/2018   Primary osteoarthritis of both hands 10/26/2018   Primary osteoarthritis of both feet 10/26/2018   Gastroesophageal reflux disease 08/27/2018   Bilateral impacted cerumen 08/21/2018   Sensorineural hearing loss (SNHL) of both ears 08/21/2018   Tinnitus, bilateral 08/21/2018   Blood in urine 04/17/2018   Secondary open-angle glaucoma of left eye, moderate stage 03/10/2018   Early stage 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 10/05/2017   Epiretinal membrane (ERM) of left eye 10/05/2017   Pseudophakia of both eyes 10/05/2017   Osteopenia 03/19/2017   Mixed hyperlipidemia 08/18/2016   Generalized osteoarthritis of multiple sites 08/18/2016   Peripheral neuropathic pain 08/18/2016   Overactive bladder 08/18/2016   Glaucoma 08/18/2016   Cervical disc disorder with radiculopathy of cervical region 08/17/2016   Carpal tunnel syndrome, right upper limb 08/17/2016   Abnormal finding on mammography 06/14/2016   Breast lump 05/06/2015    PCP: Yolande Toribio MATSU, MD  REFERRING PROVIDER: Yolande Toribio MATSU, MD  REFERRING DIAG: R26.81 (ICD-10-CM) - Unsteadiness on feet/Unsteady gait  THERAPY DIAG:  Other abnormalities of gait and mobility  Unsteadiness on feet  Muscle weakness (generalized)  Rationale for Evaluation and Treatment: Rehabilitation  ONSET DATE: 2021 ; PT order 04/04/2024  SUBJECTIVE:   SUBJECTIVE STATEMENT: Pt denies any adverse effects after prior treatment.  Pt has been performing her daughter's stairs this past week.  She reports a little improvement in performing her daughter's  stairs.  Pt continues to have difficulty with stepping up a curb.  Pt reports she has to hold on to the counter to load/unload the dishwasher and has good stability and good confidence while holding onto counter.     Pt states the exercises have helped her.  Pt states she  feels that she needs to continue with PT.       PERTINENT HISTORY: Pt uses a SPC with wlaking.  Flexor tenotomy on 3 toes of R foot on 05/01/24--Pt reports she has no restrictions from MD. Arthritis, osteopenia, peripheral neuropathy Bilat TKR  PAIN:  1/10 L knee pain currently though was a 4/10 this AM  PRECAUTIONS: Fall and Other: peripheral neuropathy   WEIGHT BEARING RESTRICTIONS: No  FALLS:  Has patient fallen in last 6 months? No  LIVING ENVIRONMENT: Lives with: lives alone Lives in:  1 story home Stairs: No Has following equipment at home: Single point cane, shower chair, and Grab bars  OCCUPATION:  Retired  PLOF: Independent  PATIENT GOALS: improved ability to pivot.  Improved walking on uneven terrain and performing stairs   OBJECTIVE:  Note: Objective measures were completed at Evaluation unless otherwise noted.  DIAGNOSTIC FINDINGS: N/A                                                                                                                                TREATMENT DATE:    LOWER EXTREMITY MMT:   MMT Right eval Left eval Right 7/24 Left 7/24 Right 9/4 Left 9/4  Hip flexion 4/5 4/5 4/5 4/5 4/5 4/5  Hip extension            Hip abduction 24.6 19.1 25.4 25.8 23.8 22.8  Hip adduction            Hip internal rotation            Hip external rotation            Knee flexion 5/5 seated 5/5 seated     5/5 seated 5/5 seated  Knee extension 4-/5 4-/5 4/5 4/5 4+/5 5/5  Ankle dorsiflexion Tol min resistance 4/5 Tol min resistance 4+/5 Tol min resistance 4+/5  Ankle plantarflexion            Ankle inversion            Ankle eversion             (Blank rows = not tested)   PATIENT SURVEYS:  ABC scale:  60%  /  41%  / 54%   5x STS test:  Initial / Prior / Current:  15.35 without UE support / 16.17 sec without UE support / 13.43 without UE support  STAIRS:  Pt ascended and descended stairs with a rail with a step through gait pattern.  Pt denies  knee pain.   Berg Balance Assessment:  43/56   See below for pt education.     PATIENT EDUCATION:  Education details:  HEP, objective findings, progress, goal progress, rationale of interventions, POC, dx, exercise form, and relevant anatomy.  PT answered pt's questions.   Person educated: Patient Education method: Explanation, demonstration, verbal cues Education comprehension: verbalized understanding and needs further education, verbal cues required, returned demonstration  HOME EXERCISE PROGRAM: Access Code: [email or web address] URL: [email or web address] Date: 05/21/2024 Prepared by: Mose Minerva  Exercises - Seated March  - 1 x daily - 7 x weekly - 2 sets - 10 reps - Seated Ankle Dorsiflexion AROM  - 1 x daily - 7 x weekly - 2 sets - 10 reps - Sit to Stand  - 1 x daily - 5-6 x weekly - 2 sets - 5-8 reps - Side Stepping with Counter Support  - 1 x daily - 7 x weekly - 2 sets - 5-10 reps  Updated HEP: - Standing in a Staggered Stance  - 1 x daily - 7 x weekly - 2 reps - 20 - 30 seconds hold - Standing March on airex with Counter Support  - 1 x daily - 5-6 x weekly - 2 sets - 10 reps - Seated Heel Raise  - 1 x daily -  7 x weekly - 2 sets - 10 reps  ASSESSMENT:  CLINICAL IMPRESSION: Pt is improving with functional mobility and balance.  Pt has benefited from PT with improved balance and stability as evidenced by performance of neuro re-ed activities in the clinic.  She continues to have decreased foot clearance with gait and weakness in R ankle DF.  Pt continues to report difficulty with stepping up a curb.  She has improved with performance of step ups and stairs though does have difficulty and limitations.  PT assessed strength.  Pt had no change in hip flexion strength, worse hip abd strength, and improved knee extension strength bilat.  Pt demonstrates improved balance as evidenced by Lars Balance Assessment improving from 35/56 to 43/56.  Pt demonstrates improved score  on the ABC scale by 13% though still worse than initial.  Her ABC scale still indicates limitations with activities and increased risk for falls. Pt demonstrates improved performance of transfers and functional LE strength with improved time on the 5x STS test.  Pt nearly met LTG#1 and met LTG #2.  She should benefit from cont skilled PT to reduce fall risk and to improve strength, balance, and functional mobility and stability.    OBJECTIVE IMPAIRMENTS: Abnormal gait, decreased activity tolerance, decreased balance, decreased endurance, decreased mobility, difficulty walking, and decreased strength.   ACTIVITY LIMITATIONS: standing, squatting, stairs, transfers, and locomotion level  PARTICIPATION LIMITATIONS: cleaning and community activity  PERSONAL FACTORS: 3+ comorbidities: Arthritis, bilat TKA, peripheral neuropathy are also affecting patient's functional outcome.   REHAB POTENTIAL: Good  CLINICAL DECISION MAKING: Stable/uncomplicated  EVALUATION COMPLEXITY: Low   GOALS:   SHORT TERM GOALS: Target date: 06/05/2024  Pt will be independent and compliant with HEP for improved strength, balance, stability, and mobility.  Baseline: Goal status: GOAL MET 06/27/24  2.  Pt will demo improved time on TUG test by at least 5 seconds for improved mobility and reduced fall risk.   Baseline:  Goal status: 80% MET  3.  Pt will report at least a 25% improvement in her balance.   Baseline:  Goal status: GOAL MET  06/27/24    LONG TERM GOALS: Target date:  09/19/2024   Pt will score at least 44/56 on the Berg Balance Assessment for improved balance and stability with daily tasks and mobility.  Baseline:  35/56 initial,  43/56 on 9/4 Goal status: 89% MET  2.  Pt will be able to load and unload her dishwasher with good stability and good confidence.  Baseline:  Goal status: GOAL MET  with holding onto counter  9/4  3.  Pt will demo improved bilat LE strength to 4+/5 in hip flexion, 5/5  in knee extension, 4-/5 in R ankle DF, and a 5# increase in L hip abduction for improved performance of functional mobility and performance of household chores.  Baseline:  Goal status:  ONGOING  4.  Pt will be able to ascend and descend stairs with a reciprocal gait with the rail.   Baseline:  Goal status: PROGRESSING  9/4  5.  Pt will report she is able to perform her normal ambulation with cane with good stability and confidence and without LOB. Baseline:  Goal status:ONGOING   PLAN:  PT FREQUENCY: 1x/wk  PT DURATION: 5-6 weeks  PLANNED INTERVENTIONS: 97164- PT Re-evaluation, 97750- Physical Performance Testing, 97110-Therapeutic exercises, 97530- Therapeutic activity, [id number]- Neuromuscular re-education, 97535- Self Care, [id number]- Manual therapy, [phone]- Gait training, [phone]- Aquatic Therapy, [phone]- Electrical stimulation (unattended), Patient/Family education, Balance  training, Stair training, Taping, DME instructions, Cryotherapy, and Moist heat  PLAN FOR NEXT SESSION: Work on [employer].  LE strengthening and balance activities.  Work on step ups and step downs next visit.  Decrease frequency to 1x/wk.   Leigh Minerva III PT, DPT 08/09/24 4:33 PM

## 2024-08-09 ENCOUNTER — Encounter (HOSPITAL_BASED_OUTPATIENT_CLINIC_OR_DEPARTMENT_OTHER): Payer: Self-pay | Admitting: Physical Therapy

## 2024-08-15 ENCOUNTER — Encounter: Payer: Self-pay | Admitting: Family Medicine

## 2024-08-15 DIAGNOSIS — G4733 Obstructive sleep apnea (adult) (pediatric): Secondary | ICD-10-CM

## 2024-08-19 ENCOUNTER — Emergency Department (HOSPITAL_COMMUNITY)
Admission: EM | Admit: 2024-08-19 | Discharge: 2024-08-19 | Disposition: A | Discharge status: Home / Self Care | Attending: Emergency Medicine | Admitting: Emergency Medicine

## 2024-08-19 ENCOUNTER — Other Ambulatory Visit: Payer: Self-pay

## 2024-08-19 ENCOUNTER — Encounter (HOSPITAL_COMMUNITY): Payer: Self-pay

## 2024-08-19 ENCOUNTER — Emergency Department (HOSPITAL_COMMUNITY)

## 2024-08-19 ENCOUNTER — Encounter (HOSPITAL_BASED_OUTPATIENT_CLINIC_OR_DEPARTMENT_OTHER): Payer: Self-pay | Admitting: Physical Therapy

## 2024-08-19 DIAGNOSIS — R072 Precordial pain: Secondary | ICD-10-CM | POA: Diagnosis present

## 2024-08-19 LAB — CBC
HCT: 40.2 % (ref 36.0–46.0)
Hemoglobin: 13 g/dL (ref 12.0–15.0)
MCH: 30 pg (ref 26.0–34.0)
MCHC: 32.3 g/dL (ref 30.0–36.0)
MCV: 92.6 fL (ref 80.0–100.0)
Platelets: 208 K/uL (ref 150–400)
RBC: 4.34 MIL/uL (ref 3.87–5.11)
RDW: 13.7 % (ref 11.5–15.5)
WBC: 8.2 K/uL (ref 4.0–10.5)
nRBC: 0 % (ref 0.0–0.2)

## 2024-08-19 LAB — BASIC METABOLIC PANEL WITH GFR
Anion gap: 14 (ref 5–15)
BUN: 21 mg/dL (ref 8–23)
CO2: 21 mmol/L — ABNORMAL LOW (ref 22–32)
Calcium: 9.1 mg/dL (ref 8.9–10.3)
Chloride: 104 mmol/L (ref 98–111)
Creatinine, Ser: 0.74 mg/dL (ref 0.44–1.00)
GFR, Estimated: >60 mL/min (ref >60)
Glucose, Bld: 108 mg/dL — ABNORMAL HIGH (ref 70–99)
Potassium: 4 mmol/L (ref 3.5–5.1)
Sodium: 139 mmol/L (ref 135–145)

## 2024-08-19 LAB — HEPATIC FUNCTION PANEL
ALT: 38 U/L (ref 0–44)
AST: 35 U/L (ref 15–41)
Albumin: 3.5 g/dL (ref 3.5–5.0)
Alkaline Phosphatase: 82 U/L (ref 38–126)
Bilirubin, Direct: 0.1 mg/dL (ref 0.0–0.2)
Indirect Bilirubin: 0.7 mg/dL (ref 0.3–0.9)
Total Bilirubin: 0.8 mg/dL (ref 0.0–1.2)
Total Protein: 6.4 g/dL — ABNORMAL LOW (ref 6.5–8.1)

## 2024-08-19 LAB — TROPONIN I (HIGH SENSITIVITY)
Troponin I (High Sensitivity): 5 ng/L (ref <18)
Troponin I (High Sensitivity): 4 ng/L (ref <18)

## 2024-08-19 LAB — LIPASE, BLOOD: Lipase: 46 U/L (ref 11–51)

## 2024-08-19 NOTE — ED Notes (Signed)
Pt ambulated to the bathroom with no issues.

## 2024-08-19 NOTE — ED Notes (Signed)
 Patient transported to X-ray

## 2024-08-19 NOTE — ED Triage Notes (Signed)
 PT [facility] from home around 1030pm tonight pt had substernal chest pain 3/10. No radiation 324mg  asa and 2 0.4 mg nitroglycerin  Bp 160/95 77 hr  14 rr  95%

## 2024-08-19 NOTE — Discharge Instructions (Signed)

## 2024-08-19 NOTE — ED Notes (Addendum)
 Pt ambulated in hall to bathroom with NT, NT outside of door until pt was finished.  . Pt stated when she was going to turn and sit down she got dizzy and fell. NT and this nurse heard fall and walked into the bathroom, pt seen on floor on right side alert and oriented x4. This nurse and NT assisted patient up and to the toilette. This nurse asked if she hit her head or had any pain at this time Patient stated no but did fall on her right hip. MD Long notified and reported to bedside when patient returned to room. Be advised patient ambulated x3 to the bathroom with 1 person assist and a steady gait.

## 2024-08-19 NOTE — ED Provider Notes (Signed)
 Emergency Department Provider Note   I have reviewed the triage vital signs and the nursing notes.   HISTORY  Chief Complaint Chest Pain   HPI Hailey Perkins is a 89 y.o. female with past history reviewed below presents emergency department for evaluation of acute onset substernal chest discomfort.  Around 10:30 PM she awoke with central chest tightness/pressure.  No shortness of breath.  No nausea or diaphoresis.  She took 2 baby aspirin and called 911.  She was instructed to take 2 more baby aspirin which she did.  EMS arrived to give 2 sublingual nitroglycerin tablets at which point her chest pain improved.  She is currently chest pain-free although she notes that she did have an episode while going to x-ray.  No prior history of ACS.    Past Medical History:  Diagnosis Date   Aortic atherosclerosis (HCC)    Arthritis    Cataract 2010   bilateral eyes   Chicken pox    Cholelithiasis    Diverticulitis    Diverticulosis    GERD (gastroesophageal reflux disease)    Glaucoma    Hiatal hernia    History of frequent urinary tract infections    Hyperlipidemia    Osteopenia    Peripheral neuropathy    Sleep apnea    wears a CPAP   Urine incontinence     Review of Systems  Constitutional: No fever/chills Cardiovascular: Positive chest pain. Respiratory: Denies shortness of breath. Gastrointestinal: No abdominal pain.  No nausea, no vomiting.  Neurological: Negative for headaches, focal weakness or numbness.   ____________________________________________   PHYSICAL EXAM:  VITAL SIGNS: ED Triage Vitals  Encounter Vitals Group     BP 08/19/24 0201 117/80     Pulse Rate 08/19/24 0201 71     Resp 08/19/24 0201 16     Temp 08/19/24 0201 97.9 F (36.6 C)     Temp Source 08/19/24 0201 Oral     SpO2 08/19/24 0201 96 %     Weight 08/19/24 0156 172 lb (78 kg)     Height 08/19/24 0156 5' 3 (1.6 m)   Constitutional: Alert and oriented. Well appearing and in no  acute distress. Eyes: Conjunctivae are normal.  Head: Atraumatic. Nose: No congestion/rhinnorhea. Mouth/Throat: Mucous membranes are moist.  Neck: No stridor.   Cardiovascular: Normal rate, regular rhythm. Good peripheral circulation. Grossly normal heart sounds.   Respiratory: Normal respiratory effort.  No retractions. Lungs CTAB. Gastrointestinal: Soft and nontender. No distention.  Musculoskeletal: No gross deformities of extremities. Neurologic:  Normal speech and language.  Skin:  Skin is warm, dry and intact. No rash noted.   ____________________________________________   LABS (all labs ordered are listed, but only abnormal results are displayed)  Labs Reviewed  BASIC METABOLIC PANEL WITH GFR - Abnormal; Notable for the following components:      Result Value   CO2 21 (*)    Glucose, Bld 108 (*)    All other components within normal limits  HEPATIC FUNCTION PANEL - Abnormal; Notable for the following components:   Total Protein 6.4 (*)    All other components within normal limits  CBC  LIPASE, BLOOD  TROPONIN I (HIGH SENSITIVITY)  TROPONIN I (HIGH SENSITIVITY)   ____________________________________________  EKG   EKG Interpretation Date/Time:  Monday August 19 2024 02:06:02 EDT Ventricular Rate:  72 PR Interval:  172 QRS Duration:  91 QT Interval:  407 QTC Calculation: 446 R Axis:   42  Text Interpretation: Sinus rhythm  Confirmed by Darra Chew [phone]) on 08/19/2024 2:09:55 AM       ____________________________________________   PROCEDURES  Procedure(s) performed:   Procedures  None  ____________________________________________   INITIAL IMPRESSION / ASSESSMENT AND PLAN / ED COURSE  Pertinent labs & imaging results that were available during my care of the patient were reviewed by me and considered in my medical decision making (see chart for details).   This patient is Presenting for Evaluation of CP, which does require a range of  treatment options, and is a complaint that involves a high risk of morbidity and mortality.  The Differential Diagnoses includes but is not exclusive to acute coronary syndrome, aortic dissection, pulmonary embolism, cardiac tamponade, community-acquired pneumonia, pericarditis, musculoskeletal chest Tilson pain, etc.  Clinical Laboratory Tests Ordered, included Troponin negative. No AKI. No anemia.   Radiologic Tests Ordered, included CXR. I independently interpreted the images and agree with radiology interpretation.   Cardiac Monitor Tracing which shows NSR.    Social Determinants of Health Risk patient is a non-smoker.   Medical Decision Making: Summary:  Patient presents to the ED with CP. No active pain. EKG without acute ischemic changes. Troponin negative x 1. Plan for serial troponin.   Reevaluation with update and discussion with patient. Second troponin negative. Just prior to discharge, patient had a mechanical fall in the bathroom. She has been able to stand since the fall and is moving the hip without issue. No plan for hip imaging. No indication for CT head. Will refer to Cardiology.   Considered admission but ACS workup is reassuring. Stable for discharge.   Patient's presentation is most consistent with acute presentation with potential threat to life or bodily function.   Disposition: discharge  ____________________________________________  FINAL CLINICAL IMPRESSION(S) / ED DIAGNOSES  Final diagnoses:  Precordial chest pain    Note:  This document was prepared using Dragon voice recognition software and may include unintentional dictation errors.  Chew Darra, MD, [facility] Emergency Medicine    Editha Bridgeforth, Chew MATSU, MD 08/22/24 [phone]
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
## 2024-08-27 NOTE — Patient Instructions (Incomplete)
 Please continue using your CPAP regularly. While your insurance requires that you use CPAP at least 4 hours each night on 70% of the nights, I recommend, that you not skip any nights and use it throughout the night if you can. Getting used to CPAP and staying with the treatment long term does take time and patience and discipline. Untreated obstructive sleep apnea when it is moderate to severe can have an adverse impact on cardiovascular health and raise her risk for heart disease, arrhythmias, hypertension, congestive heart failure, stroke and diabetes. Untreated obstructive sleep apnea causes sleep disruption, nonrestorative sleep, and sleep deprivation. This can have an impact on your day to day functioning and cause daytime sleepiness and impairment of cognitive function, memory loss, mood disturbance, and problems focussing. Using CPAP regularly can improve these symptoms.  We will update supply orders, today. You are eligible for a new machine. I will order a repeat sleep study that you will do at home. Please listen out for a call from our sleep lab staff to schedule. Once you complete study, our sleep doctors with evaluate the data and we will use that to order a new machine as indicated. This process could take a few weeks. Once new machine is ordered, you will hear back from your DME company to schedule set up of your new machine.   We will need to see you back within 31-90 days following set up of your new CPAP to document compliance as required by your insurance company. Please call the office to schedule follow up when you receive your new machine. Please feel free to reach out with any questions or concerns.    Consider starting melatonin as needed at bedtime. I recommend 5mg  daily about 30 minutes before you are ready to go to sleep.

## 2024-08-27 NOTE — Progress Notes (Unsigned)
 SABRA

## 2024-08-28 ENCOUNTER — Ambulatory Visit (HOSPITAL_BASED_OUTPATIENT_CLINIC_OR_DEPARTMENT_OTHER): Payer: Self-pay | Admitting: Physical Therapy

## 2024-08-28 ENCOUNTER — Ambulatory Visit: Payer: Medicare Other | Admitting: Family Medicine

## 2024-08-28 ENCOUNTER — Encounter: Payer: Self-pay | Admitting: Family Medicine

## 2024-08-28 VITALS — BP 126/77 | HR 85 | Ht 63.5 in | Wt 174.2 lb

## 2024-08-28 DIAGNOSIS — R2689 Other abnormalities of gait and mobility: Secondary | ICD-10-CM | POA: Diagnosis not present

## 2024-08-28 DIAGNOSIS — G4733 Obstructive sleep apnea (adult) (pediatric): Secondary | ICD-10-CM | POA: Diagnosis not present

## 2024-08-28 DIAGNOSIS — M6281 Muscle weakness (generalized): Secondary | ICD-10-CM

## 2024-08-28 DIAGNOSIS — R2681 Unsteadiness on feet: Secondary | ICD-10-CM

## 2024-08-28 NOTE — Therapy (Signed)
 OUTPATIENT PHYSICAL THERAPY LOWER EXTREMITY TREATMENT        Patient Name: Hailey Perkins MRN: [id number] DOB:[birth year], 89 y.o., female Today's Date: 08/29/2024  END OF SESSION:  PT End of Session - 08/28/24 1358     Visit Number 21    Number of Visits 25    Date for Recertification  09/19/24    Authorization Type BCBS MCR    PT Start Time 1324    PT Stop Time 1403    PT Time Calculation (min) 39 min    Activity Tolerance Patient tolerated treatment well    Behavior During Therapy WFL for tasks assessed/performed                  Past Medical History:  Diagnosis Date   Aortic atherosclerosis    Arthritis    Cataract 2010   bilateral eyes   Chicken pox    Cholelithiasis    Diverticulitis    Diverticulosis    GERD (gastroesophageal reflux disease)    Glaucoma    Hiatal hernia    History of frequent urinary tract infections    Hyperlipidemia    Osteopenia    Peripheral neuropathy    Sleep apnea    wears a CPAP   Urine incontinence    Past Surgical History:  Procedure Laterality Date   ABDOMINAL HYSTERECTOMY  1977   ANAL FISTULECTOMY  1985   ANKLE FRACTURE SURGERY Right 2014   APPENDECTOMY     BIOPSY  02/15/2021   Procedure: BIOPSY;  Surgeon: Wilhelmenia Aloha Raddle., MD;  Location: [facility];  Service: Gastroenterology;;   BREAST BIOPSY Bilateral 10+ yrs ago   BENIGN   BUNIONECTOMY Left 1975   Left Toe   CATARACT EXTRACTION, BILATERAL  2009   COLONOSCOPY WITH PROPOFOL  N/A 02/15/2021   Procedure: COLONOSCOPY WITH PROPOFOL ;  Surgeon: Wilhelmenia Aloha Raddle., MD;  Location: [facility];  Service: Gastroenterology;  Laterality: N/A;  ultraslimscope    DILATION AND CURETTAGE OF UTERUS  1972   eye PRK vision correction Left 10/09/2013   eye socket re-formed Left 2014   FOOT NEUROMA SURGERY Right 2003   plus bunionectomy, right great toe   HAMMER TOE SURGERY Right 2005   Right foot correction hammertoe little toe   HEMORRHOID SURGERY  1967    HIP SURGERY Right 2014   right hip femur pin implanted   KNEE ARTHROSCOPY Left 2010   debridement left knee scar tissue   LAPAROSCOPIC OOPHERECTOMY     1986, 1990   LASIK  1998   NEUROMA SURGERY Right 2003   Right Foot Neuroma, plus Bunionectomy, Right Great Toe   POLYPECTOMY  02/15/2021   Procedure: POLYPECTOMY;  Surgeon: Wilhelmenia Aloha Raddle., MD;  Location: [facility];  Service: Gastroenterology;;   RECTOCELE REPAIR  1968   REPLACEMENT TOTAL KNEE Left 01/2008   REPLACEMENT TOTAL KNEE Right 08/2008   retinal pucker correction Left 07/14/2012   schwannoma tumor (benign) removal  2011   right rib   SIGMOIDOSCOPY     TONSILLECTOMY AND ADENOIDECTOMY     VENTRAL HERNIA REPAIR  1993   Patient Active Problem List   Diagnosis Date Noted   Arthralgia of right elbow 08/24/2022   Bilateral wrist pain 08/24/2022   Neck pain 08/24/2022   Pain of right hip joint 08/24/2022   Chronic pain of right knee 05/27/2021   OSA on CPAP 05/27/2021   LLQ pain 12/16/2020   History of diverticulitis 12/16/2020   Hematochezia 12/16/2020   Abnormal  colonoscopy 12/16/2020   Acquired trigger finger of right index finger 10/26/2020   Other specified arthritis, right hand 10/26/2020   Diverticulitis of colon 08/15/2020   Dry eyes, bilateral 10/10/2019   Sleep-related hypoxia 08/05/2019   OSA (obstructive sleep apnea) 08/05/2019   PLMD (periodic limb movement disorder) 08/05/2019   Cerebellar 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) 05/06/2019   Excessive daytime sleepiness 05/06/2019   Loud snoring 05/06/2019   Ventral hernia without obstruction or gangrene 02/22/2019   Chronic throat clearing 01/18/2019   Nocturnal cough 01/18/2019   Family history of colon cancer in father 01/18/2019   Diverticulosis 01/18/2019   Gait abnormality 12/27/2018   Numbness 12/27/2018   Status post total bilateral knee replacement 11/30/2018   DDD (degenerative disc disease), cervical 11/30/2018   Status  post carpal tunnel release 11/30/2018   DDD (degenerative disc disease), lumbar 10/26/2018   Primary osteoarthritis of both hands 10/26/2018   Primary osteoarthritis of both feet 10/26/2018   Gastroesophageal reflux disease 08/27/2018   Bilateral impacted cerumen 08/21/2018   Sensorineural hearing loss (SNHL) of both ears 08/21/2018   Tinnitus, bilateral 08/21/2018   Blood in urine 04/17/2018   Secondary open-angle glaucoma of left eye, moderate stage 03/10/2018   Early stage 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 10/05/2017   Epiretinal membrane (ERM) of left eye 10/05/2017   Pseudophakia of both eyes 10/05/2017   Osteopenia 03/19/2017   Mixed hyperlipidemia 08/18/2016   Generalized osteoarthritis of multiple sites 08/18/2016   Peripheral neuropathic pain 08/18/2016   Overactive bladder 08/18/2016   Glaucoma 08/18/2016   Cervical disc disorder with radiculopathy of cervical region 08/17/2016   Carpal tunnel syndrome, right upper limb 08/17/2016   Abnormal finding on mammography 06/14/2016   Breast lump 05/06/2015    PCP: Yolande Toribio MATSU, MD  REFERRING PROVIDER: Yolande Toribio MATSU, MD  REFERRING DIAG: R26.81 (ICD-10-CM) - Unsteadiness on feet/Unsteady gait  THERAPY DIAG:  Other abnormalities of gait and mobility  Unsteadiness on feet  Muscle weakness (generalized)  Rationale for Evaluation and Treatment: Rehabilitation  ONSET DATE: 2021 ; PT order 04/04/2024  SUBJECTIVE:   SUBJECTIVE STATEMENT: Pt had chest pain on 08/19/24 and called 911.  She was taken to the ER and they didn't find anything.  Pt states she fell in the B/R while in the ER.  Pt denies any fractures.  Pt has had some pain from the fall and has seen her chiropractor which has helped.  Pt states MD at ER wants her to follow up with a cardiologist and she made an appt.  Pt saw her PCP on Monday. Pt states she was very tired walking around [place] on Saturday and thought it may be  helpful to have a rollator.     PERTINENT HISTORY: Pt uses a SPC with walking.  Flexor tenotomy on 3 toes of R foot on 05/01/24--Pt reports she has no restrictions from MD. Arthritis, osteopenia, peripheral neuropathy Bilat TKR  PAIN:  No pain in sitting, but has some pain in L post hip with ambulation.     PRECAUTIONS: Fall and Other: peripheral neuropathy   WEIGHT BEARING RESTRICTIONS: No  FALLS:  Has patient fallen in last 6 months? No  LIVING ENVIRONMENT: Lives with: lives alone Lives in:  1 story home Stairs: No Has following equipment at home: Single point cane, shower chair, and Grab bars  OCCUPATION:  Retired  PLOF: Independent  PATIENT GOALS: improved ability to pivot.  Improved walking on uneven terrain and performing  stairs   OBJECTIVE:  Note: Objective measures were completed at Evaluation unless otherwise noted.  DIAGNOSTIC FINDINGS: N/A                                                                                                                                TREATMENT DATE:    08/28/24: Reviewed pt presentation and pain levels.   Seated toe raises 2x10 Seated clams with GTB 2x10 Ankle DF  R: YTB 3x10, L:  RTB 3x10 Standing heel raises 2x10 Half tandem stance 2x30 sec bilat Standing head turns with FA Standing on airex with FT 2x30 sec Braiding x 2 laps with UE support on rail     PATIENT EDUCATION:  Education details:  HEP, objective findings, progress, goal progress, rationale of interventions, POC, dx, exercise form, and relevant anatomy.  PT answered pt's questions.   Person educated: Patient Education method: Explanation, demonstration, verbal cues Education comprehension: verbalized understanding and needs further education, verbal cues required, returned demonstration  HOME EXERCISE PROGRAM: Access Code: [email or web address] URL: [email or web address] Date: 05/21/2024 Prepared by: Mose Minerva  Exercises - Seated March   - 1 x daily - 7 x weekly - 2 sets - 10 reps - Seated Ankle Dorsiflexion AROM  - 1 x daily - 7 x weekly - 2 sets - 10 reps - Sit to Stand  - 1 x daily - 5-6 x weekly - 2 sets - 5-8 reps - Side Stepping with Counter Support  - 1 x daily - 7 x weekly - 2 sets - 5-10 reps  Updated HEP: - Standing in a Staggered Stance  - 1 x daily - 7 x weekly - 2 reps - 20 - 30 seconds hold - Standing March on airex with Counter Support  - 1 x daily - 5-6 x weekly - 2 sets - 10 reps - Seated Heel Raise  - 1 x daily - 7 x weekly - 2 sets - 10 reps  ASSESSMENT:  CLINICAL IMPRESSION: Pt presents to treatment with a cane as usual.  She had a fall in the B/R at the ED.  Pt denies any fractures.  She reports increased fatigue with shopping.  Pt thinks it might be helpful to have a rollator for improved ease with mobility and PT agrees.  Pt had good tolerance with exercises and performed ther ex and neuro re-ed activities well.  PT did decrease intensity of exercises since it was her 1st PT treatment since recently being in the ED.  Pt responded well to treatment stating she felt better after treatment.  Pt states she is not hurting with walking after treatment.  She should benefit from cont skilled PT to reduce fall risk and to improve strength, balance, and functional mobility and stability.      OBJECTIVE IMPAIRMENTS: Abnormal gait, decreased activity tolerance, decreased balance, decreased endurance, decreased mobility, difficulty walking, and decreased strength.   ACTIVITY LIMITATIONS: standing, squatting, stairs, transfers, and locomotion  level  PARTICIPATION LIMITATIONS: cleaning and community activity  PERSONAL FACTORS: 3+ comorbidities: Arthritis, bilat TKA, peripheral neuropathy are also affecting patient's functional outcome.   REHAB POTENTIAL: Good  CLINICAL DECISION MAKING: Stable/uncomplicated  EVALUATION COMPLEXITY: Low   GOALS:   SHORT TERM GOALS: Target date: 06/05/2024  Pt will be  independent and compliant with HEP for improved strength, balance, stability, and mobility.  Baseline: Goal status: GOAL MET 06/27/24  2.  Pt will demo improved time on TUG test by at least 5 seconds for improved mobility and reduced fall risk.   Baseline:  Goal status: 80% MET  3.  Pt will report at least a 25% improvement in her balance.   Baseline:  Goal status: GOAL MET  06/27/24    LONG TERM GOALS: Target date:  09/19/2024   Pt will score at least 44/56 on the Berg Balance Assessment for improved balance and stability with daily tasks and mobility.  Baseline:  35/56 initial,  43/56 on 9/4 Goal status: 89% MET  2.  Pt will be able to load and unload her dishwasher with good stability and good confidence.  Baseline:  Goal status: GOAL MET  with holding onto counter  9/4  3.  Pt will demo improved bilat LE strength to 4+/5 in hip flexion, 5/5 in knee extension, 4-/5 in R ankle DF, and a 5# increase in L hip abduction for improved performance of functional mobility and performance of household chores.  Baseline:  Goal status:  ONGOING  4.  Pt will be able to ascend and descend stairs with a reciprocal gait with the rail.   Baseline:  Goal status: PROGRESSING  9/4  5.  Pt will report she is able to perform her normal ambulation with cane with good stability and confidence and without LOB. Baseline:  Goal status:ONGOING   PLAN:  PT FREQUENCY: 1x/wk  PT DURATION: 5-6 weeks  PLANNED INTERVENTIONS: 02835- PT Re-evaluation, 97750- Physical Performance Testing, 97110-Therapeutic exercises, 97530- Therapeutic activity, [id number]- Neuromuscular re-education, 97535- Self Care, 02859- Manual therapy, [phone]- Gait training, [phone]- Aquatic Therapy, [phone]- Electrical stimulation (unattended), Patient/Family education, Balance training, Stair training, Taping, DME instructions, Cryotherapy, and Moist heat  PLAN FOR NEXT SESSION: Work on [employer].  LE strengthening and balance activities.  Work on  step ups and step downs next visit.  Decrease frequency to 1x/wk.   Leigh Minerva III PT, DPT 08/29/24 9:54 PM

## 2024-08-29 ENCOUNTER — Encounter (HOSPITAL_BASED_OUTPATIENT_CLINIC_OR_DEPARTMENT_OTHER): Payer: Self-pay | Admitting: Physical Therapy

## 2024-09-05 ENCOUNTER — Ambulatory Visit (HOSPITAL_BASED_OUTPATIENT_CLINIC_OR_DEPARTMENT_OTHER): Payer: Self-pay | Attending: Internal Medicine | Admitting: Physical Therapy

## 2024-09-05 ENCOUNTER — Encounter (HOSPITAL_BASED_OUTPATIENT_CLINIC_OR_DEPARTMENT_OTHER): Payer: Self-pay | Admitting: Physical Therapy

## 2024-09-05 DIAGNOSIS — M25511 Pain in right shoulder: Secondary | ICD-10-CM | POA: Insufficient documentation

## 2024-09-05 DIAGNOSIS — R2689 Other abnormalities of gait and mobility: Secondary | ICD-10-CM | POA: Insufficient documentation

## 2024-09-05 DIAGNOSIS — R2681 Unsteadiness on feet: Secondary | ICD-10-CM | POA: Insufficient documentation

## 2024-09-05 DIAGNOSIS — M25611 Stiffness of right shoulder, not elsewhere classified: Secondary | ICD-10-CM | POA: Diagnosis present

## 2024-09-05 DIAGNOSIS — M6281 Muscle weakness (generalized): Secondary | ICD-10-CM | POA: Insufficient documentation

## 2024-09-05 NOTE — Therapy (Signed)
 OUTPATIENT PHYSICAL THERAPY LOWER EXTREMITY TREATMENT        Patient Name: Hailey Perkins MRN: [id number] DOB:11/21/1935, 89 y.o., female Today's Date: 09/06/2024  END OF SESSION:  PT End of Session - 09/05/24 1157     Visit Number 22    Number of Visits 25    Date for Recertification  09/19/24    Authorization Type BCBS MCR    PT Start Time 1149    PT Stop Time 1229    PT Time Calculation (min) 40 min    Activity Tolerance Patient tolerated treatment well    Behavior During Therapy WFL for tasks assessed/performed                   Past Medical History:  Diagnosis Date   Aortic atherosclerosis    Arthritis    Cataract 2010   bilateral eyes   Chicken pox    Cholelithiasis    Diverticulitis    Diverticulosis    GERD (gastroesophageal reflux disease)    Glaucoma    Hiatal hernia    History of frequent urinary tract infections    Hyperlipidemia    Osteopenia    Peripheral neuropathy    Sleep apnea    wears a CPAP   Urine incontinence    Past Surgical History:  Procedure Laterality Date   ABDOMINAL HYSTERECTOMY  1977   ANAL FISTULECTOMY  1985   ANKLE FRACTURE SURGERY Right 2014   APPENDECTOMY     BIOPSY  02/15/2021   Procedure: BIOPSY;  Surgeon: Wilhelmenia Aloha Raddle., MD;  Location: [facility];  Service: Gastroenterology;;   BREAST BIOPSY Bilateral 10+ yrs ago   BENIGN   BUNIONECTOMY Left 1975   Left Toe   CATARACT EXTRACTION, BILATERAL  2009   COLONOSCOPY WITH PROPOFOL  N/A 02/15/2021   Procedure: COLONOSCOPY WITH PROPOFOL ;  Surgeon: Wilhelmenia Aloha Raddle., MD;  Location: [facility];  Service: Gastroenterology;  Laterality: N/A;  ultraslimscope    DILATION AND CURETTAGE OF UTERUS  1972   eye PRK vision correction Left 10/09/2013   eye socket re-formed Left 2014   FOOT NEUROMA SURGERY Right 2003   plus bunionectomy, right great toe   HAMMER TOE SURGERY Right 2005   Right foot correction hammertoe little toe   HEMORRHOID SURGERY  1967    HIP SURGERY Right 2014   right hip femur pin implanted   KNEE ARTHROSCOPY Left 2010   debridement left knee scar tissue   LAPAROSCOPIC OOPHERECTOMY     1986, 1990   LASIK  1998   NEUROMA SURGERY Right 2003   Right Foot Neuroma, plus Bunionectomy, Right Great Toe   POLYPECTOMY  02/15/2021   Procedure: POLYPECTOMY;  Surgeon: Wilhelmenia Aloha Raddle., MD;  Location: [facility];  Service: Gastroenterology;;   RECTOCELE REPAIR  1968   REPLACEMENT TOTAL KNEE Left 01/2008   REPLACEMENT TOTAL KNEE Right 08/2008   retinal pucker correction Left 07/14/2012   schwannoma tumor (benign) removal  2011   right rib   SIGMOIDOSCOPY     TONSILLECTOMY AND ADENOIDECTOMY     VENTRAL HERNIA REPAIR  1993   Patient Active Problem List   Diagnosis Date Noted   Arthralgia of right elbow 08/24/2022   Bilateral wrist pain 08/24/2022   Neck pain 08/24/2022   Pain of right hip joint 08/24/2022   Chronic pain of right knee 05/27/2021   OSA on CPAP 05/27/2021   LLQ pain 12/16/2020   History of diverticulitis 12/16/2020   Hematochezia 12/16/2020  Abnormal colonoscopy 12/16/2020   Acquired trigger finger of right index finger 10/26/2020   Other specified arthritis, right hand 10/26/2020   Diverticulitis of colon 08/15/2020   Dry eyes, bilateral 10/10/2019   Sleep-related hypoxia 08/05/2019   OSA (obstructive sleep apnea) 08/05/2019   PLMD (periodic limb movement disorder) 08/05/2019   Cerebellar 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) 05/06/2019   Excessive daytime sleepiness 05/06/2019   Loud snoring 05/06/2019   Ventral hernia without obstruction or gangrene 02/22/2019   Chronic throat clearing 01/18/2019   Nocturnal cough 01/18/2019   Family history of colon cancer in father 01/18/2019   Diverticulosis 01/18/2019   Gait abnormality 12/27/2018   Numbness 12/27/2018   Status post total bilateral knee replacement 11/30/2018   DDD (degenerative disc disease), cervical 11/30/2018   Status  post carpal tunnel release 11/30/2018   DDD (degenerative disc disease), lumbar 10/26/2018   Primary osteoarthritis of both hands 10/26/2018   Primary osteoarthritis of both feet 10/26/2018   Gastroesophageal reflux disease 08/27/2018   Bilateral impacted cerumen 08/21/2018   Sensorineural hearing loss (SNHL) of both ears 08/21/2018   Tinnitus, bilateral 08/21/2018   Blood in urine 04/17/2018   Secondary open-angle glaucoma of left eye, moderate stage 03/10/2018   Early stage 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 10/05/2017   Epiretinal membrane (ERM) of left eye 10/05/2017   Pseudophakia of both eyes 10/05/2017   Osteopenia 03/19/2017   Mixed hyperlipidemia 08/18/2016   Generalized osteoarthritis of multiple sites 08/18/2016   Peripheral neuropathic pain 08/18/2016   Overactive bladder 08/18/2016   Glaucoma 08/18/2016   Cervical disc disorder with radiculopathy of cervical region 08/17/2016   Carpal tunnel syndrome, right upper limb 08/17/2016   Abnormal finding on mammography 06/14/2016   Breast lump 05/06/2015    PCP: Yolande Toribio MATSU, MD  REFERRING PROVIDER: Yolande Toribio MATSU, MD  REFERRING DIAG: R26.81 (ICD-10-CM) - Unsteadiness on feet/Unsteady gait  THERAPY DIAG:  Other abnormalities of gait and mobility  Unsteadiness on feet  Muscle weakness (generalized)  Rationale for Evaluation and Treatment: Rehabilitation  ONSET DATE: 2021 ; PT order 04/04/2024  SUBJECTIVE:   SUBJECTIVE STATEMENT: Pt had a fall on Tuesday when leaving the chiropractor.  She fell onto her R side when descending the curb.  She injured her L thumb and has soreness in R shoulder and elbow.  Pt went to the [facility] and had x rays which showed no fractures.  Her soreness is improving.  Pt states MD has given an order for rollator.        PERTINENT HISTORY: Pt uses a SPC with walking.  Flexor tenotomy on 3 toes of R foot on 05/01/24--Pt reports she has no  restrictions from MD. Arthritis, osteopenia, peripheral neuropathy Bilat TKR  PAIN:  Pt has soreness in R UE.   L thumb pain:  0/10 at rest, 8-9/10 pain with bending thumb     PRECAUTIONS: Fall and Other: peripheral neuropathy   WEIGHT BEARING RESTRICTIONS: No  FALLS:  Has patient fallen in last 6 months? No  LIVING ENVIRONMENT: Lives with: lives alone Lives in:  1 story home Stairs: No Has following equipment at home: Single point cane, shower chair, and Grab bars  OCCUPATION:  Retired  PLOF: Independent  PATIENT GOALS: improved ability to pivot.  Improved walking on uneven terrain and performing stairs   OBJECTIVE:  Note: Objective measures were completed at Evaluation unless otherwise noted.  DIAGNOSTIC FINDINGS: N/A  TREATMENT DATE:    09/05/24: Reviewed pt presentation and pain levels.   Sidestepping without UE support x 3 laps with SBA Standing with EC x 30 sec, with FT 2x30 sec Half tandem stance x30 sec on floor, x 30 sec on airex with UE support as needed and min assist Tandem stance x 30 sec with UE support Standing head turns with FA, FT, and on airex with FA Marching on airex 2x10 with UE support and SBA Step ups and step downs on a 4 inch step     PATIENT EDUCATION:  Education details:  HEP, objective findings, progress, goal progress, rationale of interventions, POC, dx, exercise form, and relevant anatomy.  PT answered pt's questions.   Person educated: Patient Education method: Explanation, demonstration, verbal cues Education comprehension: verbalized understanding and needs further education, verbal cues required, returned demonstration  HOME EXERCISE PROGRAM: Access Code: [email or web address] URL: [email or web address] Date: 05/21/2024 Prepared by: Mose Minerva  Exercises - Seated March  - 1 x daily - 7 x  weekly - 2 sets - 10 reps - Seated Ankle Dorsiflexion AROM  - 1 x daily - 7 x weekly - 2 sets - 10 reps - Sit to Stand  - 1 x daily - 5-6 x weekly - 2 sets - 5-8 reps - Side Stepping with Counter Support  - 1 x daily - 7 x weekly - 2 sets - 5-10 reps  Updated HEP: - Standing in a Staggered Stance  - 1 x daily - 7 x weekly - 2 reps - 20 - 30 seconds hold - Standing March on airex with Counter Support  - 1 x daily - 5-6 x weekly - 2 sets - 10 reps - Seated Heel Raise  - 1 x daily - 7 x weekly - 2 sets - 10 reps  ASSESSMENT:  CLINICAL IMPRESSION: Pt has had another fall.  She recently fell when descending a curb.  Pt reports her x rays were negative for fx.  Pt's MD has ordered a rollator for pt. Pt plans to bring in rollator to PT and work on correct usage of rollator with ambulation.  PT worked on balance and proprioception training to improve balance, stability, functional mobility, and to decrease fall risk.  Pt also performed step up/steps downs on a 4 inch step to improve performance of and stability with curbs.  PT provided seated rest breaks t/o treatment as pt needed.  She responded well to treatment having no c/o's after treatment.  She should benefit from cont skilled PT to reduce fall risk and to improve strength, balance, and functional mobility and stability.     OBJECTIVE IMPAIRMENTS: Abnormal gait, decreased activity tolerance, decreased balance, decreased endurance, decreased mobility, difficulty walking, and decreased strength.   ACTIVITY LIMITATIONS: standing, squatting, stairs, transfers, and locomotion level  PARTICIPATION LIMITATIONS: cleaning and community activity  PERSONAL FACTORS: 3+ comorbidities: Arthritis, bilat TKA, peripheral neuropathy are also affecting patient's functional outcome.   REHAB POTENTIAL: Good  CLINICAL DECISION MAKING: Stable/uncomplicated  EVALUATION COMPLEXITY: Low   GOALS:   SHORT TERM GOALS: Target date: 06/05/2024  Pt will be  independent and compliant with HEP for improved strength, balance, stability, and mobility.  Baseline: Goal status: GOAL MET 06/27/24  2.  Pt will demo improved time on TUG test by at least 5 seconds for improved mobility and reduced fall risk.   Baseline:  Goal status: 80% MET  3.  Pt will report at least a 25% improvement in her balance.  Baseline:  Goal status: GOAL MET  06/27/24    LONG TERM GOALS: Target date:  09/19/2024   Pt will score at least 44/56 on the Berg Balance Assessment for improved balance and stability with daily tasks and mobility.  Baseline:  35/56 initial,  43/56 on 9/4 Goal status: 89% MET  2.  Pt will be able to load and unload her dishwasher with good stability and good confidence.  Baseline:  Goal status: GOAL MET  with holding onto counter  9/4  3.  Pt will demo improved bilat LE strength to 4+/5 in hip flexion, 5/5 in knee extension, 4-/5 in R ankle DF, and a 5# increase in L hip abduction for improved performance of functional mobility and performance of household chores.  Baseline:  Goal status:  ONGOING  4.  Pt will be able to ascend and descend stairs with a reciprocal gait with the rail.   Baseline:  Goal status: PROGRESSING  9/4  5.  Pt will report she is able to perform her normal ambulation with cane with good stability and confidence and without LOB. Baseline:  Goal status:ONGOING   PLAN:  PT FREQUENCY: 1x/wk  PT DURATION: 5-6 weeks  PLANNED INTERVENTIONS: 02835- PT Re-evaluation, 97750- Physical Performance Testing, 97110-Therapeutic exercises, 97530- Therapeutic activity, [id number]- Neuromuscular re-education, 97535- Self Care, 02859- Manual therapy, [phone]- Gait training, [phone]- Aquatic Therapy, [phone]- Electrical stimulation (unattended), Patient/Family education, Balance training, Stair training, Taping, DME instructions, Cryotherapy, and Moist heat  PLAN FOR NEXT SESSION: Work on [employer].  LE strengthening and balance activities.  Work on  step ups and step downs next visit.    Leigh Minerva III PT, DPT 09/06/24 3:23 PM

## 2024-09-10 ENCOUNTER — Ambulatory Visit: Admitting: Neurology

## 2024-09-10 ENCOUNTER — Encounter (HOSPITAL_BASED_OUTPATIENT_CLINIC_OR_DEPARTMENT_OTHER): Payer: Self-pay | Admitting: Physical Therapy

## 2024-09-10 ENCOUNTER — Ambulatory Visit (HOSPITAL_BASED_OUTPATIENT_CLINIC_OR_DEPARTMENT_OTHER): Admitting: Physical Therapy

## 2024-09-10 DIAGNOSIS — G4733 Obstructive sleep apnea (adult) (pediatric): Secondary | ICD-10-CM

## 2024-09-10 DIAGNOSIS — G4734 Idiopathic sleep related nonobstructive alveolar hypoventilation: Secondary | ICD-10-CM

## 2024-09-10 DIAGNOSIS — G3281 Cerebellar 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: Secondary | ICD-10-CM | POA: Diagnosis not present

## 2024-09-10 DIAGNOSIS — R0683 Snoring: Secondary | ICD-10-CM

## 2024-09-10 DIAGNOSIS — R2689 Other abnormalities of gait and mobility: Secondary | ICD-10-CM

## 2024-09-10 DIAGNOSIS — R2681 Unsteadiness on feet: Secondary | ICD-10-CM

## 2024-09-10 DIAGNOSIS — M6281 Muscle weakness (generalized): Secondary | ICD-10-CM

## 2024-09-10 DIAGNOSIS — G4719 Other hypersomnia: Secondary | ICD-10-CM

## 2024-09-10 NOTE — Therapy (Signed)
 OUTPATIENT PHYSICAL THERAPY LOWER EXTREMITY TREATMENT        Patient Name: Hailey Perkins MRN: [id number] DOB:[birth year], 89 y.o., female Today's Date: 09/11/2024  END OF SESSION:  PT End of Session - 09/10/24 0952     Visit Number 23    Number of Visits 25    Date for Recertification  09/19/24    Authorization Type BCBS MCR    PT Start Time 0946    PT Stop Time 1024    PT Time Calculation (min) 38 min    Activity Tolerance Patient tolerated treatment well    Behavior During Therapy [facility] for tasks assessed/performed                    Past Medical History:  Diagnosis Date   Aortic atherosclerosis    Arthritis    Cataract 2010   bilateral eyes   Chicken pox    Cholelithiasis    Diverticulitis    Diverticulosis    GERD (gastroesophageal reflux disease)    Glaucoma    Hiatal hernia    History of frequent urinary tract infections    Hyperlipidemia    Osteopenia    Peripheral neuropathy    Sleep apnea    wears a CPAP   Urine incontinence    Past Surgical History:  Procedure Laterality Date   ABDOMINAL HYSTERECTOMY  1977   ANAL FISTULECTOMY  1985   ANKLE FRACTURE SURGERY Right 2014   APPENDECTOMY     BIOPSY  02/15/2021   Procedure: BIOPSY;  Surgeon: Wilhelmenia Aloha Raddle., MD;  Location: [facility];  Service: Gastroenterology;;   BREAST BIOPSY Bilateral 10+ yrs ago   BENIGN   BUNIONECTOMY Left 1975   Left Toe   CATARACT EXTRACTION, BILATERAL  2009   COLONOSCOPY WITH PROPOFOL  N/A 02/15/2021   Procedure: COLONOSCOPY WITH PROPOFOL ;  Surgeon: Wilhelmenia Aloha Raddle., MD;  Location: [facility];  Service: Gastroenterology;  Laterality: N/A;  ultraslimscope    DILATION AND CURETTAGE OF UTERUS  1972   eye PRK vision correction Left 10/09/2013   eye socket re-formed Left 2014   FOOT NEUROMA SURGERY Right 2003   plus bunionectomy, right great toe   HAMMER TOE SURGERY Right 2005   Right foot correction hammertoe little toe   HEMORRHOID SURGERY   1967   HIP SURGERY Right 2014   right hip femur pin implanted   KNEE ARTHROSCOPY Left 2010   debridement left knee scar tissue   LAPAROSCOPIC OOPHERECTOMY     1986, 1990   LASIK  1998   NEUROMA SURGERY Right 2003   Right Foot Neuroma, plus Bunionectomy, Right Great Toe   POLYPECTOMY  02/15/2021   Procedure: POLYPECTOMY;  Surgeon: Wilhelmenia Aloha Raddle., MD;  Location: [facility];  Service: Gastroenterology;;   RECTOCELE REPAIR  1968   REPLACEMENT TOTAL KNEE Left 01/2008   REPLACEMENT TOTAL KNEE Right 08/2008   retinal pucker correction Left 07/14/2012   schwannoma tumor (benign) removal  2011   right rib   SIGMOIDOSCOPY     TONSILLECTOMY AND ADENOIDECTOMY     VENTRAL HERNIA REPAIR  1993   Patient Active Problem List   Diagnosis Date Noted   Arthralgia of right elbow 08/24/2022   Bilateral wrist pain 08/24/2022   Neck pain 08/24/2022   Pain of right hip joint 08/24/2022   Chronic pain of right knee 05/27/2021   OSA on CPAP 05/27/2021   LLQ pain 12/16/2020   History of diverticulitis 12/16/2020   Hematochezia 12/16/2020  Abnormal colonoscopy 12/16/2020   Acquired trigger finger of right index finger 10/26/2020   Other specified arthritis, right hand 10/26/2020   Diverticulitis of colon 08/15/2020   Dry eyes, bilateral 10/10/2019   Sleep-related hypoxia 08/05/2019   OSA (obstructive sleep apnea) 08/05/2019   PLMD (periodic limb movement disorder) 08/05/2019   Cerebellar 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) 05/06/2019   Excessive daytime sleepiness 05/06/2019   Loud snoring 05/06/2019   Ventral hernia without obstruction or gangrene 02/22/2019   Chronic throat clearing 01/18/2019   Nocturnal cough 01/18/2019   Family history of colon cancer in father 01/18/2019   Diverticulosis 01/18/2019   Gait abnormality 12/27/2018   Numbness 12/27/2018   Status post total bilateral knee replacement 11/30/2018   DDD (degenerative disc disease), cervical 11/30/2018    Status post carpal tunnel release 11/30/2018   DDD (degenerative disc disease), lumbar 10/26/2018   Primary osteoarthritis of both hands 10/26/2018   Primary osteoarthritis of both feet 10/26/2018   Gastroesophageal reflux disease 08/27/2018   Bilateral impacted cerumen 08/21/2018   Sensorineural hearing loss (SNHL) of both ears 08/21/2018   Tinnitus, bilateral 08/21/2018   Blood in urine 04/17/2018   Secondary open-angle glaucoma of left eye, moderate stage 03/10/2018   Early stage 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 10/05/2017   Epiretinal membrane (ERM) of left eye 10/05/2017   Pseudophakia of both eyes 10/05/2017   Osteopenia 03/19/2017   Mixed hyperlipidemia 08/18/2016   Generalized osteoarthritis of multiple sites 08/18/2016   Peripheral neuropathic pain 08/18/2016   Overactive bladder 08/18/2016   Glaucoma 08/18/2016   Cervical disc disorder with radiculopathy of cervical region 08/17/2016   Carpal tunnel syndrome, right upper limb 08/17/2016   Abnormal finding on mammography 06/14/2016   Breast lump 05/06/2015    PCP: Yolande Toribio MATSU, MD  REFERRING PROVIDER: Yolande Toribio MATSU, MD  REFERRING DIAG: R26.81 (ICD-10-CM) - Unsteadiness on feet/Unsteady gait  THERAPY DIAG:  Other abnormalities of gait and mobility  Unsteadiness on feet  Muscle weakness (generalized)  Rationale for Evaluation and Treatment: Rehabilitation  ONSET DATE: 2021 ; PT order 04/04/2024  SUBJECTIVE:   SUBJECTIVE STATEMENT: Pt states her R UE is still sore from her fall.  She is using her walker instead of the cane due to her R UE soreness.  Pt had difficulty sleeping last night due to her R UE pain.  Pt denies any adverse effects after prior treatment.       PERTINENT HISTORY: Pt uses a SPC with walking.  Flexor tenotomy on 3 toes of R foot on 05/01/24--Pt reports she has no restrictions from MD. Arthritis, osteopenia, peripheral neuropathy Bilat TKR  PAIN:   Pain in R UE and no pain in legs.     PRECAUTIONS: Fall and Other: peripheral neuropathy   WEIGHT BEARING RESTRICTIONS: No  FALLS:  Has patient fallen in last 6 months? No  LIVING ENVIRONMENT: Lives with: lives alone Lives in:  1 story home Stairs: No Has following equipment at home: Single point cane, shower chair, and Grab bars  OCCUPATION:  Retired  PLOF: Independent  PATIENT GOALS: improved ability to pivot.  Improved walking on uneven terrain and performing stairs   OBJECTIVE:  Note: Objective measures were completed at Evaluation unless otherwise noted.  DIAGNOSTIC FINDINGS: N/A  TREATMENT DATE:    09/10/24:  Adjusted height of walker and educated pt concerning correct height.  Pt ambulated with FWW with lower height and felt better.   Reviewed pt presentation and pain levels.   Sidestepping without UE support 2 sets of 2 reps x 3 laps with SBA Standing with EC with FT 2x30 sec Half tandem stance 2 x 30 sec on airex with UE support as needed and min assist Standing head turns with FA and FT and on airex x 10 each Marching on airex 2x10 with UE support and SBA Step ups and step downs on a 4 inch step Tandem gait with UE support Sit to stands x 5 reps with UE support     PATIENT EDUCATION:  Education details:  HEP, walker height, rationale of interventions, POC, dx, exercise form, and relevant anatomy.  PT answered pt's questions.   Person educated: Patient Education method: Explanation, demonstration, verbal cues Education comprehension: verbalized understanding and needs further education, verbal cues required, returned demonstration  HOME EXERCISE PROGRAM: Access Code: [email or web address] URL: [email or web address] Date: 05/21/2024 Prepared by: Mose Minerva  Exercises - Seated March  - 1 x daily - 7 x weekly - 2 sets  - 10 reps - Seated Ankle Dorsiflexion AROM  - 1 x daily - 7 x weekly - 2 sets - 10 reps - Sit to Stand  - 1 x daily - 5-6 x weekly - 2 sets - 5-8 reps - Side Stepping with Counter Support  - 1 x daily - 7 x weekly - 2 sets - 5-10 reps - Standing in a Staggered Stance  - 1 x daily - 7 x weekly - 2 reps - 20 - 30 seconds hold - Standing March on airex with Counter Support  - 1 x daily - 5-6 x weekly - 2 sets - 10 reps - Seated Heel Raise  - 1 x daily - 7 x weekly - 2 sets - 10 reps  ASSESSMENT:  CLINICAL IMPRESSION: Pt presented to the clinic with FWW.  Her walker was positioned too high.  PT adjusted height of walker and educated pt concerning correct height.  Pt ambulated with FWW with lower height and felt better.  PT worked on balance, stability, and functional strength and mobility.  Pt performed step up/steps downs on a 4 inch step to improve performance of and stability with curbs.  PT provided seated rest breaks t/o treatment as pt needed.  Pt gave good effort with all exercises and responded well to treatment having no c/o's after treatment.  She should benefit from cont skilled PT to reduce fall risk and to improve strength, balance, and functional mobility and stability.    OBJECTIVE IMPAIRMENTS: Abnormal gait, decreased activity tolerance, decreased balance, decreased endurance, decreased mobility, difficulty walking, and decreased strength.   ACTIVITY LIMITATIONS: standing, squatting, stairs, transfers, and locomotion level  PARTICIPATION LIMITATIONS: cleaning and community activity  PERSONAL FACTORS: 3+ comorbidities: Arthritis, bilat TKA, peripheral neuropathy are also affecting patient's functional outcome.   REHAB POTENTIAL: Good  CLINICAL DECISION MAKING: Stable/uncomplicated  EVALUATION COMPLEXITY: Low   GOALS:   SHORT TERM GOALS: Target date: 06/05/2024  Pt will be independent and compliant with HEP for improved strength, balance, stability, and mobility.   Baseline: Goal status: GOAL MET 06/27/24  2.  Pt will demo improved time on TUG test by at least 5 seconds for improved mobility and reduced fall risk.   Baseline:  Goal status: 80% MET  3.  Pt will report  at least a 25% improvement in her balance.   Baseline:  Goal status: GOAL MET  06/27/24    LONG TERM GOALS: Target date:  09/19/2024   Pt will score at least 44/56 on the Berg Balance Assessment for improved balance and stability with daily tasks and mobility.  Baseline:  35/56 initial,  43/56 on 9/4 Goal status: 89% MET  2.  Pt will be able to load and unload her dishwasher with good stability and good confidence.  Baseline:  Goal status: GOAL MET  with holding onto counter  9/4  3.  Pt will demo improved bilat LE strength to 4+/5 in hip flexion, 5/5 in knee extension, 4-/5 in R ankle DF, and a 5# increase in L hip abduction for improved performance of functional mobility and performance of household chores.  Baseline:  Goal status:  ONGOING  4.  Pt will be able to ascend and descend stairs with a reciprocal gait with the rail.   Baseline:  Goal status: PROGRESSING  9/4  5.  Pt will report she is able to perform her normal ambulation with cane with good stability and confidence and without LOB. Baseline:  Goal status:ONGOING   PLAN:  PT FREQUENCY: 1x/wk  PT DURATION: 5-6 weeks  PLANNED INTERVENTIONS: 02835- PT Re-evaluation, 97750- Physical Performance Testing, 97110-Therapeutic exercises, 97530- Therapeutic activity, [id number]- Neuromuscular re-education, 97535- Self Care, 02859- Manual therapy, [phone]- Gait training, [phone]- Aquatic Therapy, [phone]- Electrical stimulation (unattended), Patient/Family education, Balance training, Stair training, Taping, DME instructions, Cryotherapy, and Moist heat  PLAN FOR NEXT SESSION: Work on [employer].  LE strengthening and balance activities.  Work on step ups and step downs.    Leigh Minerva III PT, DPT 09/11/24 2:26  PM
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
## 2024-09-17 ENCOUNTER — Ambulatory Visit (HOSPITAL_BASED_OUTPATIENT_CLINIC_OR_DEPARTMENT_OTHER): Admitting: Physical Therapy

## 2024-09-17 DIAGNOSIS — M25511 Pain in right shoulder: Secondary | ICD-10-CM

## 2024-09-17 DIAGNOSIS — R2689 Other abnormalities of gait and mobility: Secondary | ICD-10-CM | POA: Diagnosis not present

## 2024-09-17 DIAGNOSIS — M25611 Stiffness of right shoulder, not elsewhere classified: Secondary | ICD-10-CM

## 2024-09-17 NOTE — Therapy (Signed)
 OUTPATIENT PHYSICAL THERAPY SHOULDER EVALUATION        Patient Name: Hailey Perkins MRN: [id number] DOB:1935/10/10, 89 y.o., female Today's Date: 09/18/2024  END OF SESSION:  PT End of Session - 09/17/24 1411     Visit Number 24   R shoulder eval   Number of Visits 36    Date for Recertification  10/29/24    Authorization Type [employer] MCR    PT Start Time 1328    PT Stop Time 1414    PT Time Calculation (min) 46 min    Activity Tolerance Patient tolerated treatment well    Behavior During Therapy WFL for tasks assessed/performed                     Past Medical History:  Diagnosis Date   Aortic atherosclerosis    Arthritis    Cataract 2010   bilateral eyes   Chicken pox    Cholelithiasis    Diverticulitis    Diverticulosis    GERD (gastroesophageal reflux disease)    Glaucoma    Hiatal hernia    History of frequent urinary tract infections    Hyperlipidemia    Osteopenia    Peripheral neuropathy    Sleep apnea    wears a CPAP   Urine incontinence    Past Surgical History:  Procedure Laterality Date   ABDOMINAL HYSTERECTOMY  1977   ANAL FISTULECTOMY  1985   ANKLE FRACTURE SURGERY Right 2014   APPENDECTOMY     BIOPSY  02/15/2021   Procedure: BIOPSY;  Surgeon: Wilhelmenia Aloha Raddle., MD;  Location: [facility];  Service: Gastroenterology;;   BREAST BIOPSY Bilateral 10+ yrs ago   BENIGN   BUNIONECTOMY Left 1975   Left Toe   CATARACT EXTRACTION, BILATERAL  2009   COLONOSCOPY WITH PROPOFOL  N/A 02/15/2021   Procedure: COLONOSCOPY WITH PROPOFOL ;  Surgeon: Wilhelmenia Aloha Raddle., MD;  Location: [facility];  Service: Gastroenterology;  Laterality: N/A;  ultraslimscope    DILATION AND CURETTAGE OF UTERUS  1972   eye PRK vision correction Left 10/09/2013   eye socket re-formed Left 2014   FOOT NEUROMA SURGERY Right 2003   plus bunionectomy, right great toe   HAMMER TOE SURGERY Right 2005   Right foot correction hammertoe little toe    HEMORRHOID SURGERY  1967   HIP SURGERY Right 2014   right hip femur pin implanted   KNEE ARTHROSCOPY Left 2010   debridement left knee scar tissue   LAPAROSCOPIC OOPHERECTOMY     1986, 1990   LASIK  1998   NEUROMA SURGERY Right 2003   Right Foot Neuroma, plus Bunionectomy, Right Great Toe   POLYPECTOMY  02/15/2021   Procedure: POLYPECTOMY;  Surgeon: Wilhelmenia Aloha Raddle., MD;  Location: [facility];  Service: Gastroenterology;;   RECTOCELE REPAIR  1968   REPLACEMENT TOTAL KNEE Left 01/2008   REPLACEMENT TOTAL KNEE Right 08/2008   retinal pucker correction Left 07/14/2012   schwannoma tumor (benign) removal  2011   right rib   SIGMOIDOSCOPY     TONSILLECTOMY AND ADENOIDECTOMY     VENTRAL HERNIA REPAIR  1993   Patient Active Problem List   Diagnosis Date Noted   Arthralgia of right elbow 08/24/2022   Bilateral wrist pain 08/24/2022   Neck pain 08/24/2022   Pain of right hip joint 08/24/2022   Chronic pain of right knee 05/27/2021   OSA on CPAP 05/27/2021   LLQ pain 12/16/2020   History of diverticulitis 12/16/2020  Hematochezia 12/16/2020   Abnormal colonoscopy 12/16/2020   Acquired trigger finger of right index finger 10/26/2020   Other specified arthritis, right hand 10/26/2020   Diverticulitis of colon 08/15/2020   Dry eyes, bilateral 10/10/2019   Sleep-related hypoxia 08/05/2019   OSA (obstructive sleep apnea) 08/05/2019   PLMD (periodic limb movement disorder) 08/05/2019   Cerebellar 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) 05/06/2019   Excessive daytime sleepiness 05/06/2019   Loud snoring 05/06/2019   Ventral hernia without obstruction or gangrene 02/22/2019   Chronic throat clearing 01/18/2019   Nocturnal cough 01/18/2019   Family history of colon cancer in father 01/18/2019   Diverticulosis 01/18/2019   Gait abnormality 12/27/2018   Numbness 12/27/2018   Status post total bilateral knee replacement 11/30/2018   DDD (degenerative disc disease),  cervical 11/30/2018   Status post carpal tunnel release 11/30/2018   DDD (degenerative disc disease), lumbar 10/26/2018   Primary osteoarthritis of both hands 10/26/2018   Primary osteoarthritis of both feet 10/26/2018   Gastroesophageal reflux disease 08/27/2018   Bilateral impacted cerumen 08/21/2018   Sensorineural hearing loss (SNHL) of both ears 08/21/2018   Tinnitus, bilateral 08/21/2018   Blood in urine 04/17/2018   Secondary open-angle glaucoma of left eye, moderate stage 03/10/2018   Early stage 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 10/05/2017   Epiretinal membrane (ERM) of left eye 10/05/2017   Pseudophakia of both eyes 10/05/2017   Osteopenia 03/19/2017   Mixed hyperlipidemia 08/18/2016   Generalized osteoarthritis of multiple sites 08/18/2016   Peripheral neuropathic pain 08/18/2016   Overactive bladder 08/18/2016   Glaucoma 08/18/2016   Cervical disc disorder with radiculopathy of cervical region 08/17/2016   Carpal tunnel syndrome, right upper limb 08/17/2016   Abnormal finding on mammography 06/14/2016   Breast lump 05/06/2015    PCP: Yolande Toribio MATSU, MD  REFERRING PROVIDER:  Delane Lye, MD   REFERRING DIAG: M25.511 (ICD-10-CM) - Pain in right shoulder   THERAPY DIAG:  Right shoulder pain, unspecified chronicity  Stiffness of right shoulder, not elsewhere classified  Rationale for Evaluation and Treatment: Rehabilitation  ONSET DATE: 2021 ; PT order 04/04/2024  SUBJECTIVE:   SUBJECTIVE STATEMENT: Pt had 2 falls, one on 9/15 in the B/R at th ER and one on 9/30 leaving the chiropractor's office.  She landed on her R elbow which jarred her shoulder.  Pt had pain in L thumb, R elbow, and R shoulder.  Pt had x rays which were all negative.  Pt had her follow up at [facility] on 10/10.  She received a cortisone injection in R shoulder and he ordered PT.  PT order indicated eval and treat for R shoulder and elbow.  Pt states she has  felt better since the cortisone injection. Pt has difficulty sleeping though has improved.  Pt is limited with shoulder mobility and reaching.  Pt unable to disengage the gate on the back of her car with R UE.  Pt has difficulty with dressing and uses L UE more.  Pt has difficulty reaching to tie shoe laces.  Pt is limited with activities in the pool.        PERTINENT HISTORY: Pt uses a SPC with walking.  Flexor tenotomy on 3 toes of R foot on 05/01/24--Pt reports she has no restrictions from MD. Arthritis, osteopenia, peripheral neuropathy Bilat TKR  PAIN:  R shoulder:  0/10 current and best, 7-8/10 worst    PRECAUTIONS: Fall and Other: peripheral neuropathy   WEIGHT BEARING RESTRICTIONS: No  FALLS:  Has patient fallen in last 6 months? No  LIVING ENVIRONMENT: Lives with: lives alone Lives in:  1 story home Stairs: No Has following equipment at home: Single point cane, shower chair, and Grab bars  OCCUPATION:  Retired  PLOF: Independent  PATIENT GOALS: improved ability to pivot.  Improved walking on uneven terrain and performing stairs   OBJECTIVE:  Note: Objective measures were completed at Evaluation unless otherwise noted.  DIAGNOSTIC FINDINGS: Pt reports x rays were negative for fracture.   TREATMENT DATE:   09/17/24 PALPATION:  TTP R in anterior, lateral, and posterior shoulder.  Minimal tenderness in R biceps  Shoulder AROM: Flexion:  R:  114   L:  136 [place]: R: 116  L:  122 ER:  R: 77      L:  75 IR:    L:   70        L:  67  Elbow AROM: R: lacking 12/5 deg AROM/PROM - 146 deg L:  0 - 156 deg  Shoulder strength: ER:  5/5 bilat, pain on R IR:  5/5 bilat  UEFI:  40/80  Pt performed supine wand flexion.                                                                                                                                   PATIENT EDUCATION:  Education details:  HEP, walker height, rationale of interventions, POC, dx, exercise form,  and relevant anatomy.  PT answered pt's questions.   Person educated: Patient Education method: Explanation, demonstration, verbal cues Education comprehension: verbalized understanding and needs further education, verbal cues required, returned demonstration  HOME EXERCISE PROGRAM:   Access Code: [email or web address] URL: [email or web address] Date: 09/17/2024 Prepared by: Mose Minerva  Exercises - Supine Shoulder Wand flexion AAROM  - 2 x daily - 7 x weekly - 1-2 sets - 10 reps  ASSESSMENT:  CLINICAL IMPRESSION: Pt presents to clinic with PT orders for shoulder pain.  She has had 2 falls recently and injured her R elbow and shoulder and L thumb with the 2nd fall on 09/03/24.  Pt had x rays which were all negative.  She received a cortisone injection in R shoulder and has felt better since the cortisone injection.  Pt has difficulty sleeping though has improved.  Pt is limited with reaching and has difficulty with dressing.  She compensates with activities using her L UE more laces.  Pt is limited with activities in the pool.  Pt had good strength in R shoulder ER and IR.  Pt was limited with shoulder elevation ROM.  Pt also has limited elbow extension ROM.  She should benefit from skilled PT to address impairments and improve overall function.     OBJECTIVE IMPAIRMENTS: Abnormal gait, decreased activity tolerance, decreased balance, decreased endurance, decreased mobility, difficulty walking, and decreased strength.   ACTIVITY LIMITATIONS: standing, squatting, stairs, transfers, and locomotion level  PARTICIPATION LIMITATIONS: cleaning  and community activity  PERSONAL FACTORS: 3+ comorbidities: Arthritis, bilat TKA, peripheral neuropathy are also affecting patient's functional outcome.   REHAB POTENTIAL: Good  CLINICAL DECISION MAKING: Stable/uncomplicated  EVALUATION COMPLEXITY: Low   GOALS:   SHORT TERM GOALS: Target date:  06/05/2024   Pt will be independent and  compliant with HEP for improved strength, balance, stability, and mobility.  Baseline: Goal status: GOAL MET 06/27/24  2.  Pt will demo improved time on TUG test by at least 5 seconds for improved mobility and reduced fall risk.   Baseline:  Goal status: 80% MET  3.  Pt will report at least a 25% improvement in her balance.   Baseline:  Goal status: GOAL MET  06/27/24  4.  Pt will report at least a 25% improvement in daily usage of R UE without adverse effects.  Goal Status:  INITIAL Target date: 10/09/2024  5.  Pt will demo at least a 12-15 deg increase in R shoulder flexion AROM and a 5-7 deg increase in R elbow extension AROM for improved performance of reaching and performance of daily activities.  Goal Status:  INITIAL Target date: 10/16/2024      LONG TERM GOALS: Target date:  10/29/2024   Pt will score at least 44/56 on the Berg Balance Assessment for improved balance and stability with daily tasks and mobility.  Baseline:  35/56 initial,  43/56 on 9/4 Goal status: 89% MET  2.  Pt will be able to load and unload her dishwasher with good stability and good confidence.  Baseline:  Goal status: GOAL MET  with holding onto counter  9/4  3.  Pt will demo improved bilat LE strength to 4+/5 in hip flexion, 5/5 in knee extension, 4-/5 in R ankle DF, and a 5# increase in L hip abduction for improved performance of functional mobility and performance of household chores.  Baseline:  Goal status:  ONGOING  4.  Pt will be able to ascend and descend stairs with a reciprocal gait with the rail.   Baseline:  Goal status: PROGRESSING  9/4  5.  Pt will report she is able to perform her normal ambulation with cane with good stability and confidence and without LOB. Baseline:  Goal status:ONGOING  6.  Pt will report she is able to perform her normal reaching activities including reaching into an overhead cabinet without significant difficulty and pain.   Goal status:  INITIAL  7.   Pt will be able to dress without difficulty.   Goal status:  INITIAL  8.  Pt will be able to perform her ADLs without significant shoulder pain and difficulty.   Goal status:  INITIAL   PLAN:  PT FREQUENCY: 2x/wk  PT DURATION: 6 weeks  PLANNED INTERVENTIONS: [id number]- PT Re-evaluation, 97750- Physical Performance Testing, 97110-Therapeutic exercises, 97530- Therapeutic activity, [id number]- Neuromuscular re-education, 97535- Self Care, [id number]- Manual therapy, [phone]- Gait training, [phone]- Aquatic Therapy, [phone]- Electrical stimulation (unattended), Patient/Family education, Balance training, Stair training, Taping, DME instructions, Cryotherapy, and Moist heat  PLAN FOR NEXT SESSION: Work on [employer].  LE strengthening and balance activities.  Work on step ups and step downs.  Cont with shoulder ROM. Add in scap retraction.  Perform pulleys   Leigh Minerva III PT, DPT 09/19/24 12:43 PM

## 2024-09-18 ENCOUNTER — Encounter (HOSPITAL_BASED_OUTPATIENT_CLINIC_OR_DEPARTMENT_OTHER): Payer: Self-pay | Admitting: Physical Therapy

## 2024-09-24 ENCOUNTER — Ambulatory Visit (HOSPITAL_BASED_OUTPATIENT_CLINIC_OR_DEPARTMENT_OTHER): Admitting: Physical Therapy

## 2024-09-24 DIAGNOSIS — M25611 Stiffness of right shoulder, not elsewhere classified: Secondary | ICD-10-CM

## 2024-09-24 DIAGNOSIS — R2689 Other abnormalities of gait and mobility: Secondary | ICD-10-CM | POA: Diagnosis not present

## 2024-09-24 DIAGNOSIS — M6281 Muscle weakness (generalized): Secondary | ICD-10-CM

## 2024-09-24 DIAGNOSIS — M25511 Pain in right shoulder: Secondary | ICD-10-CM

## 2024-09-24 NOTE — Therapy (Signed)
 OUTPATIENT PHYSICAL THERAPY SHOULDER TREATMENT       Patient Name: Hailey Perkins MRN: [id number] DOB:09-20-1935, 89 y.o., female Today's Date: 09/25/2024  END OF SESSION:  PT End of Session - 09/24/24 1330     Visit Number 25   2 for shoulder   Number of Visits 36    Date for Recertification  10/29/24    Authorization Type BCBS MCR    PT Start Time 1325    PT Stop Time 1407    PT Time Calculation (min) 42 min    Activity Tolerance Patient tolerated treatment well    Behavior During Therapy WFL for tasks assessed/performed                     Past Medical History:  Diagnosis Date   Aortic atherosclerosis    Arthritis    Cataract 2010   bilateral eyes   Chicken pox    Cholelithiasis    Diverticulitis    Diverticulosis    GERD (gastroesophageal reflux disease)    Glaucoma    Hiatal hernia    History of frequent urinary tract infections    Hyperlipidemia    Osteopenia    Peripheral neuropathy    Sleep apnea    wears a CPAP   Urine incontinence    Past Surgical History:  Procedure Laterality Date   ABDOMINAL HYSTERECTOMY  1977   ANAL FISTULECTOMY  1985   ANKLE FRACTURE SURGERY Right 2014   APPENDECTOMY     BIOPSY  02/15/2021   Procedure: BIOPSY;  Surgeon: Wilhelmenia Aloha Raddle., MD;  Location: [facility];  Service: Gastroenterology;;   BREAST BIOPSY Bilateral 10+ yrs ago   BENIGN   BUNIONECTOMY Left 1975   Left Toe   CATARACT EXTRACTION, BILATERAL  2009   COLONOSCOPY WITH PROPOFOL  N/A 02/15/2021   Procedure: COLONOSCOPY WITH PROPOFOL ;  Surgeon: Wilhelmenia Aloha Raddle., MD;  Location: [facility];  Service: Gastroenterology;  Laterality: N/A;  ultraslimscope    DILATION AND CURETTAGE OF UTERUS  1972   eye PRK vision correction Left 10/09/2013   eye socket re-formed Left 2014   FOOT NEUROMA SURGERY Right 2003   plus bunionectomy, right great toe   HAMMER TOE SURGERY Right 2005   Right foot correction hammertoe little toe   HEMORRHOID  SURGERY  1967   HIP SURGERY Right 2014   right hip femur pin implanted   KNEE ARTHROSCOPY Left 2010   debridement left knee scar tissue   LAPAROSCOPIC OOPHERECTOMY     1986, 1990   LASIK  1998   NEUROMA SURGERY Right 2003   Right Foot Neuroma, plus Bunionectomy, Right Great Toe   POLYPECTOMY  02/15/2021   Procedure: POLYPECTOMY;  Surgeon: Wilhelmenia Aloha Raddle., MD;  Location: [facility];  Service: Gastroenterology;;   RECTOCELE REPAIR  1968   REPLACEMENT TOTAL KNEE Left 01/2008   REPLACEMENT TOTAL KNEE Right 08/2008   retinal pucker correction Left 07/14/2012   schwannoma tumor (benign) removal  2011   right rib   SIGMOIDOSCOPY     TONSILLECTOMY AND ADENOIDECTOMY     VENTRAL HERNIA REPAIR  1993   Patient Active Problem List   Diagnosis Date Noted   Arthralgia of right elbow 08/24/2022   Bilateral wrist pain 08/24/2022   Neck pain 08/24/2022   Pain of right hip joint 08/24/2022   Chronic pain of right knee 05/27/2021   OSA on CPAP 05/27/2021   LLQ pain 12/16/2020   History of diverticulitis 12/16/2020  Hematochezia 12/16/2020   Abnormal colonoscopy 12/16/2020   Acquired trigger finger of right index finger 10/26/2020   Other specified arthritis, right hand 10/26/2020   Diverticulitis of colon 08/15/2020   Dry eyes, bilateral 10/10/2019   Sleep-related hypoxia 08/05/2019   OSA (obstructive sleep apnea) 08/05/2019   PLMD (periodic limb movement disorder) 08/05/2019   Cerebellar 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) 05/06/2019   Excessive daytime sleepiness 05/06/2019   Loud snoring 05/06/2019   Ventral hernia without obstruction or gangrene 02/22/2019   Chronic throat clearing 01/18/2019   Nocturnal cough 01/18/2019   Family history of colon cancer in father 01/18/2019   Diverticulosis 01/18/2019   Gait abnormality 12/27/2018   Numbness 12/27/2018   Status post total bilateral knee replacement 11/30/2018   DDD (degenerative disc disease), cervical  11/30/2018   Status post carpal tunnel release 11/30/2018   DDD (degenerative disc disease), lumbar 10/26/2018   Primary osteoarthritis of both hands 10/26/2018   Primary osteoarthritis of both feet 10/26/2018   Gastroesophageal reflux disease 08/27/2018   Bilateral impacted cerumen 08/21/2018   Sensorineural hearing loss (SNHL) of both ears 08/21/2018   Tinnitus, bilateral 08/21/2018   Blood in urine 04/17/2018   Secondary open-angle glaucoma of left eye, moderate stage 03/10/2018   Early stage 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 10/05/2017   Epiretinal membrane (ERM) of left eye 10/05/2017   Pseudophakia of both eyes 10/05/2017   Osteopenia 03/19/2017   Mixed hyperlipidemia 08/18/2016   Generalized osteoarthritis of multiple sites 08/18/2016   Peripheral neuropathic pain 08/18/2016   Overactive bladder 08/18/2016   Glaucoma 08/18/2016   Cervical disc disorder with radiculopathy of cervical region 08/17/2016   Carpal tunnel syndrome, right upper limb 08/17/2016   Abnormal finding on mammography 06/14/2016   Breast lump 05/06/2015    PCP: Yolande Toribio MATSU, MD  REFERRING PROVIDER:  Delane Lye, MD   REFERRING DIAG: M25.511 (ICD-10-CM) - Pain in right shoulder   THERAPY DIAG:  Right shoulder pain, unspecified chronicity  Stiffness of right shoulder, not elsewhere classified  Muscle weakness (generalized)  Rationale for Evaluation and Treatment: Rehabilitation  ONSET DATE: 2021 ; PT order 04/04/2024  SUBJECTIVE:   SUBJECTIVE STATEMENT: Pt denies any adverse effects after prior treatment.  Pt hasn't been able to do her home exercise due to being out of town.       PERTINENT HISTORY: Pt uses a SPC with walking.  Flexor tenotomy on 3 toes of R foot on 05/01/24--Pt reports she has no restrictions from MD. Arthritis, osteopenia, peripheral neuropathy Bilat TKR  PAIN:  R shoulder:  very low, no pain at rest    PRECAUTIONS: Fall and  Other: peripheral neuropathy   WEIGHT BEARING RESTRICTIONS: No  FALLS:  Has patient fallen in last 6 months? No  LIVING ENVIRONMENT: Lives with: lives alone Lives in:  1 story home Stairs: No Has following equipment at home: Single point cane, shower chair, and Grab bars  OCCUPATION:  Retired  PLOF: Independent  PATIENT GOALS: improved ability to pivot.  Improved walking on uneven terrain and performing stairs   OBJECTIVE:  Note: Objective measures were completed at Evaluation unless otherwise noted.  DIAGNOSTIC FINDINGS: Pt reports x rays were negative for fracture.   TREATMENT DATE:   09/24/24  Pulleys in flexion and scaption approx 20 each Seated elbow flexion and extension AROM 3x10 Seated scap retraction 2x10 Supine wand flexion 2x10 Supine elbow flexion/extension AROM  Supine serratus punch 2x10  Pt received R shoulder flexion and  scaption PROM per pt and tissue tolerance  Pt received a HEP handout and was educated in correct form and appropriate frequency.  PT instructed pt to not perform into a painful range.    09/17/24 PALPATION:  TTP R in anterior, lateral, and posterior shoulder.  Minimal tenderness in R biceps  Shoulder AROM: Flexion:  R:  114   L:  136 Scaption: R: 116  L:  122 ER:  R: 77      L:  75 IR:    L:   70        L:  67  Elbow AROM: R: lacking 12/5 deg AROM/PROM - 146 deg L:  0 - 156 deg  Shoulder strength: ER:  5/5 bilat, pain on R IR:  5/5 bilat  UEFI:  40/80  Pt performed supine wand flexion.                                                                                                                                   PATIENT EDUCATION:  Education details:  HEP, walker height, rationale of interventions, POC, dx, exercise form, and relevant anatomy.  PT answered pt's questions.   Person educated: Patient Education method: Explanation, demonstration, verbal cues Education comprehension: verbalized understanding and needs  further education, verbal cues required, returned demonstration  HOME EXERCISE PROGRAM:   Access Code: [email or web address] URL: [email or web address] Date: 09/17/2024 Prepared by: Mose Minerva  Exercises - Supine Shoulder Wand flexion AAROM  - 2 x daily - 7 x weekly - 1-2 sets - 10 reps  Updated HEP: - SEATED ELBOW FLEXION AND EXTENSION AROM  - 1 x daily - 7 x weekly - 2 sets - 10 reps - Seated Scapular Retraction  - 1-2 x daily - 7 x weekly - 2 sets - 10 reps  ASSESSMENT:  CLINICAL IMPRESSION: Pt presents to clinic with PT orders for shoulder pain.  She has had 2 falls recently and injured her R elbow and shoulder and L thumb with the 2nd fall on 09/03/24.  Pt had x rays which were all negative.  She received a cortisone injection in R shoulder and has felt better since the cortisone injection.  Pt has difficulty sleeping though has improved.  Pt is limited with reaching and has difficulty with dressing.  She compensates with activities using her L UE more laces.  Pt is limited with activities in the pool.  Pt had good strength in R shoulder ER and IR.  Pt was limited with shoulder elevation ROM.  Pt also has limited elbow extension ROM.  She should benefit from skilled PT to address impairments and improve overall function.    No pain   OBJECTIVE IMPAIRMENTS: Abnormal gait, decreased activity tolerance, decreased balance, decreased endurance, decreased mobility, difficulty walking, and decreased strength.   ACTIVITY LIMITATIONS: standing, squatting, stairs, transfers, and locomotion level  PARTICIPATION LIMITATIONS: cleaning and community activity  PERSONAL FACTORS: 3+ comorbidities: Arthritis,  bilat TKA, peripheral neuropathy are also affecting patient's functional outcome.   REHAB POTENTIAL: Good  CLINICAL DECISION MAKING: Stable/uncomplicated  EVALUATION COMPLEXITY: Low   GOALS:   SHORT TERM GOALS: Target date:  06/05/2024   Pt will be independent and compliant  with HEP for improved strength, balance, stability, and mobility.  Baseline: Goal status: GOAL MET 06/27/24  2.  Pt will demo improved time on TUG test by at least 5 seconds for improved mobility and reduced fall risk.   Baseline:  Goal status: 80% MET  3.  Pt will report at least a 25% improvement in her balance.   Baseline:  Goal status: GOAL MET  06/27/24  4.  Pt will report at least a 25% improvement in daily usage of R UE without adverse effects.  Goal Status:  INITIAL Target date: 10/09/2024  5.  Pt will demo at least a 12-15 deg increase in R shoulder flexion AROM and a 5-7 deg increase in R elbow extension AROM for improved performance of reaching and performance of daily activities.  Goal Status:  INITIAL Target date: 10/16/2024      LONG TERM GOALS: Target date:  10/29/2024   Pt will score at least 44/56 on the Berg Balance Assessment for improved balance and stability with daily tasks and mobility.  Baseline:  35/56 initial,  43/56 on 9/4 Goal status: 89% MET  2.  Pt will be able to load and unload her dishwasher with good stability and good confidence.  Baseline:  Goal status: GOAL MET  with holding onto counter  9/4  3.  Pt will demo improved bilat LE strength to 4+/5 in hip flexion, 5/5 in knee extension, 4-/5 in R ankle DF, and a 5# increase in L hip abduction for improved performance of functional mobility and performance of household chores.  Baseline:  Goal status:  ONGOING  4.  Pt will be able to ascend and descend stairs with a reciprocal gait with the rail.   Baseline:  Goal status: PROGRESSING  9/4  5.  Pt will report she is able to perform her normal ambulation with cane with good stability and confidence and without LOB. Baseline:  Goal status:ONGOING  6.  Pt will report she is able to perform her normal reaching activities including reaching into an overhead cabinet without significant difficulty and pain.   Goal status:  INITIAL  7.  Pt will be  able to dress without difficulty.   Goal status:  INITIAL  8.  Pt will be able to perform her ADLs without significant shoulder pain and difficulty.   Goal status:  INITIAL   PLAN:  PT FREQUENCY: 2x/wk  PT DURATION: 6 weeks  PLANNED INTERVENTIONS: 97164- PT Re-evaluation, 97750- Physical Performance Testing, 97110-Therapeutic exercises, 97530- Therapeutic activity, [id number]- Neuromuscular re-education, 97535- Self Care, [id number]- Manual therapy, [phone]- Gait training, [phone]- Aquatic Therapy, [phone]- Electrical stimulation (unattended), Patient/Family education, Balance training, Stair training, Taping, DME instructions, Cryotherapy, and Moist heat  PLAN FOR NEXT SESSION: Work on [employer].  LE strengthening and balance activities.  Work on step ups and step downs.  Cont with shoulder ROM. Add in scap retraction.  Perform pulleys   Leigh Minerva III PT, DPT 09/25/24 5:47 PM

## 2024-09-25 ENCOUNTER — Encounter (HOSPITAL_BASED_OUTPATIENT_CLINIC_OR_DEPARTMENT_OTHER): Payer: Self-pay | Admitting: Physical Therapy
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
## 2024-09-26 ENCOUNTER — Ambulatory Visit: Payer: Self-pay | Admitting: Family Medicine

## 2024-10-01 ENCOUNTER — Encounter (HOSPITAL_BASED_OUTPATIENT_CLINIC_OR_DEPARTMENT_OTHER): Admitting: Physical Therapy

## 2024-10-10 ENCOUNTER — Ambulatory Visit (HOSPITAL_BASED_OUTPATIENT_CLINIC_OR_DEPARTMENT_OTHER): Attending: Internal Medicine | Admitting: Physical Therapy

## 2024-10-10 ENCOUNTER — Encounter (HOSPITAL_BASED_OUTPATIENT_CLINIC_OR_DEPARTMENT_OTHER): Payer: Self-pay | Admitting: Physical Therapy

## 2024-10-10 DIAGNOSIS — M6281 Muscle weakness (generalized): Secondary | ICD-10-CM | POA: Diagnosis present

## 2024-10-10 DIAGNOSIS — R2689 Other abnormalities of gait and mobility: Secondary | ICD-10-CM | POA: Insufficient documentation

## 2024-10-10 DIAGNOSIS — R2681 Unsteadiness on feet: Secondary | ICD-10-CM | POA: Insufficient documentation

## 2024-10-10 DIAGNOSIS — M25511 Pain in right shoulder: Secondary | ICD-10-CM | POA: Diagnosis present

## 2024-10-10 DIAGNOSIS — M25611 Stiffness of right shoulder, not elsewhere classified: Secondary | ICD-10-CM | POA: Diagnosis present

## 2024-10-10 NOTE — Therapy (Signed)
 OUTPATIENT PHYSICAL THERAPY SHOULDER TREATMENT       Patient Name: Hailey Perkins MRN: [id number] DOB:[birth year], 89 y.o., female Today's Date: 10/10/2024  END OF SESSION:  PT End of Session - 10/10/24 1024     Visit Number 26    Number of Visits 36    Date for Recertification  11/14/24   10/29/24 shoulder   Authorization Type BC[employer] MCR    PT Start Time 1021    PT Stop Time 1107    PT Time Calculation (min) 46 min    Activity Tolerance Patient tolerated treatment well    Behavior During Therapy WFL for tasks assessed/performed                     Past Medical History:  Diagnosis Date   Aortic atherosclerosis    Arthritis    Cataract 2010   bilateral eyes   Chicken pox    Cholelithiasis    Diverticulitis    Diverticulosis    GERD (gastroesophageal reflux disease)    Glaucoma    Hiatal hernia    History of frequent urinary tract infections    Hyperlipidemia    Osteopenia    Peripheral neuropathy    Sleep apnea    wears a CPAP   Urine incontinence    Past Surgical History:  Procedure Laterality Date   ABDOMINAL HYSTERECTOMY  1977   ANAL FISTULECTOMY  1985   ANKLE FRACTURE SURGERY Right 2014   APPENDECTOMY     BIOPSY  02/15/2021   Procedure: BIOPSY;  Surgeon: Wilhelmenia Aloha Raddle., MD;  Location: [facility];  Service: Gastroenterology;;   BREAST BIOPSY Bilateral 10+ yrs ago   BENIGN   BUNIONECTOMY Left 1975   Left Toe   CATARACT EXTRACTION, BILATERAL  2009   COLONOSCOPY WITH PROPOFOL  N/A 02/15/2021   Procedure: COLONOSCOPY WITH PROPOFOL ;  Surgeon: Wilhelmenia Aloha Raddle., MD;  Location: [facility];  Service: Gastroenterology;  Laterality: N/A;  ultraslimscope    DILATION AND CURETTAGE OF UTERUS  1972   eye PRK vision correction Left 10/09/2013   eye socket re-formed Left 2014   FOOT NEUROMA SURGERY Right 2003   plus bunionectomy, right great toe   HAMMER TOE SURGERY Right 2005   Right foot correction hammertoe little toe   HEMORRHOID  SURGERY  1967   HIP SURGERY Right 2014   right hip femur pin implanted   KNEE ARTHROSCOPY Left 2010   debridement left knee scar tissue   LAPAROSCOPIC OOPHERECTOMY     1986, 1990   LASIK  1998   NEUROMA SURGERY Right 2003   Right Foot Neuroma, plus Bunionectomy, Right Great Toe   POLYPECTOMY  02/15/2021   Procedure: POLYPECTOMY;  Surgeon: Wilhelmenia Aloha Raddle., MD;  Location: [facility];  Service: Gastroenterology;;   RECTOCELE REPAIR  1968   REPLACEMENT TOTAL KNEE Left 01/2008   REPLACEMENT TOTAL KNEE Right 08/2008   retinal pucker correction Left 07/14/2012   schwannoma tumor (benign) removal  2011   right rib   SIGMOIDOSCOPY     TONSILLECTOMY AND ADENOIDECTOMY     VENTRAL HERNIA REPAIR  1993   Patient Active Problem List   Diagnosis Date Noted   Arthralgia of right elbow 08/24/2022   Bilateral wrist pain 08/24/2022   Neck pain 08/24/2022   Pain of right hip joint 08/24/2022   Chronic pain of right knee 05/27/2021   OSA on CPAP 05/27/2021   LLQ pain 12/16/2020   History of diverticulitis 12/16/2020   Hematochezia  12/16/2020   Abnormal colonoscopy 12/16/2020   Acquired trigger finger of right index finger 10/26/2020   Other specified arthritis, right hand 10/26/2020   Diverticulitis of colon 08/15/2020   Dry eyes, bilateral 10/10/2019   Sleep-related hypoxia 08/05/2019   OSA (obstructive sleep apnea) 08/05/2019   PLMD (periodic limb movement disorder) 08/05/2019   Cerebellar 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) 05/06/2019   Excessive daytime sleepiness 05/06/2019   Loud snoring 05/06/2019   Ventral hernia without obstruction or gangrene 02/22/2019   Chronic throat clearing 01/18/2019   Nocturnal cough 01/18/2019   Family history of colon cancer in father 01/18/2019   Diverticulosis 01/18/2019   Gait abnormality 12/27/2018   Numbness 12/27/2018   Status post total bilateral knee replacement 11/30/2018   DDD (degenerative disc disease), cervical  11/30/2018   Status post carpal tunnel release 11/30/2018   DDD (degenerative disc disease), lumbar 10/26/2018   Primary osteoarthritis of both hands 10/26/2018   Primary osteoarthritis of both feet 10/26/2018   Gastroesophageal reflux disease 08/27/2018   Bilateral impacted cerumen 08/21/2018   Sensorineural hearing loss (SNHL) of both ears 08/21/2018   Tinnitus, bilateral 08/21/2018   Blood in urine 04/17/2018   Secondary open-angle glaucoma of left eye, moderate stage 03/10/2018   Early stage 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 10/05/2017   Epiretinal membrane (ERM) of left eye 10/05/2017   Pseudophakia of both eyes 10/05/2017   Osteopenia 03/19/2017   Mixed hyperlipidemia 08/18/2016   Generalized osteoarthritis of multiple sites 08/18/2016   Peripheral neuropathic pain 08/18/2016   Overactive bladder 08/18/2016   Glaucoma 08/18/2016   Cervical disc disorder with radiculopathy of cervical region 08/17/2016   Carpal tunnel syndrome, right upper limb 08/17/2016   Abnormal finding on mammography 06/14/2016   Breast lump 05/06/2015    PCP: Yolande Toribio MATSU, MD  REFERRING PROVIDER:  Yolande Toribio MATSU, MD   REFERRING DIAG:  R26.81 (ICD-10-CM) - Unsteadiness on feet/Unsteady gait   THERAPY DIAG:  Other abnormalities of gait and mobility  Unsteadiness on feet  Muscle weakness (generalized)  Rationale for Evaluation and Treatment: Rehabilitation  ONSET DATE: 2021 ; PT order 04/04/2024  SUBJECTIVE:   SUBJECTIVE STATEMENT: Pt denies any adverse effects after prior treatment.  Pt states she has done as many exercises as she could due to being out of town.  Pt downloaded the [employer] app.      Pt denies having any falls recently since her last 2 falls.  Pt has difficulty with ascending/descending curbs.  Pt has balance deficits.  Pt states she has balance issues with turning her head.  Pt reports having difficulty with opening heavy doors and is  careful to not lose her balance bwds.  Pt reports improved dressing. Pt states she may be stronger in her legs now.      PERTINENT HISTORY: Pt uses a SPC with walking.  Flexor tenotomy on 3 toes of R foot on 05/01/24--Pt reports she has no restrictions from MD. Arthritis, osteopenia, peripheral neuropathy Bilat TKR  PAIN:  R shoulder:  very low, no pain at rest    PRECAUTIONS: Fall and Other: peripheral neuropathy   WEIGHT BEARING RESTRICTIONS: No  FALLS:  Has patient fallen in last 6 months? No  LIVING ENVIRONMENT: Lives with: lives alone Lives in:  1 story home Stairs: No Has following equipment at home: Single point cane, shower chair, and Grab bars  OCCUPATION:  Retired  PLOF: Independent  PATIENT GOALS: improved ability to pivot.  Improved walking on  uneven terrain and performing stairs   OBJECTIVE:  Note: Objective measures were completed at Evaluation unless otherwise noted.  DIAGNOSTIC FINDINGS: Pt reports x rays were negative for fracture.   TREATMENT DATE:   11/6  LOWER EXTREMITY MMT:   MMT Right eval Left eval Right 7/24 Left 7/24 Right 9/4 Left 9/4 Right 11/6 Left 11/6  Hip flexion 4/5 4/5 4/5 4/5 4/5 4/5 5/5 4+/5  Hip extension                Hip abduction 24.6 19.1 25.4 25.8 23.8 22.8 23.3 22.2  Hip adduction                Hip internal rotation                Hip external rotation                Knee flexion 5/5 seated 5/5 seated     5/5 seated 5/5 seated 5/5 seated 5/5 seated  Knee extension 4-/5 4-/5 4/5 4/5 4+/5 5/5 4/5 4+/5  Ankle dorsiflexion Tol min resistance 4/5 Tol min resistance 4+/5 Tol min resistance 4+/5 Tol min resistance 4/5  Ankle plantarflexion                Ankle inversion                Ankle eversion                 (Blank rows = not tested)   PATIENT SURVEYS:  ABC scale:  60%  /  41%  / 54%  / 44% (current)     5x STS test:  Initial / Prior / Current:  15.35 without UE support /  13.43 without UE support  /  19.82 without UE support     BERG Balance Test          Date: 11/6  Sit to Stand 4  Standing unsupported 4  Sitting with back unsupported but feet supported 4  Stand to sit  4  Transfers  4  Standing unsupported with eyes closed 4  Standing unsupported feet together 4  From standing position, reach forward with outstretched arm 2  From standing position, pick up object from floor 3  From standing position, turn and look behind over each shoulder 3  Turn 360 1  Standing unsupported, alternately place foot on step 0  Standing unsupported, one foot in front 3  Standing on one leg 0  Total:  40/56    Berg Balance Assessment:  Prior/Current:  43/56 / 40/56                                                                                                                                  PATIENT EDUCATION:  Education details:  HEP, walker height, rationale of interventions, POC, dx, exercise form, and relevant anatomy.  PT answered pt's questions.   Person  educated: Patient Education method: Explanation, demonstration, verbal cues Education comprehension: verbalized understanding and needs further education, verbal cues required, returned demonstration  HOME EXERCISE PROGRAM:   Access Code: [email or web address] URL: [email or web address] Date: 09/17/2024 Prepared by: Mose Minerva  Exercises - Supine Shoulder Wand flexion AAROM  - 2 x daily - 7 x weekly - 1-2 sets - 10 reps  Updated HEP: - SEATED ELBOW FLEXION AND EXTENSION AROM  - 1 x daily - 7 x weekly - 2 sets - 10 reps - Seated Scapular Retraction  - 1-2 x daily - 7 x weekly - 2 sets - 10 reps  ASSESSMENT:  CLINICAL IMPRESSION: Pt had 2 falls in September and denies having any falls since.  Pt reports having balance deficits including when turning her head.  She has difficulty with ascending/descending curbs.  Pt reports improved dressing.  Pt demonstrates improved strength in bilat hip flexion though decreased  strength in knee extension and L ankle DF, and slightly decreased strength in bilat hip abd.  Pt demonstrates worse balance as evidenced by decreased score on the Berg Balance Assessment.  Pt's 5x STS test was 6 seconds worse compared to prior testing.  Pt also has decreased confidence in balance with decreased ABC scale score.  PT educated pt concerning objective findings, progress, and deficits.  See below for goal progress.  PT added a new goal concerning balance.  Pt should benefit from continued skilled PT to improve balance, LE strength, gait, functional mobility and to decrease fall risk.        OBJECTIVE IMPAIRMENTS: Abnormal gait, decreased activity tolerance, decreased balance, decreased endurance, decreased mobility, difficulty walking, and decreased strength.   ACTIVITY LIMITATIONS: standing, squatting, stairs, transfers, and locomotion level  PARTICIPATION LIMITATIONS: cleaning and community activity  PERSONAL FACTORS: 3+ comorbidities: Arthritis, bilat TKA, peripheral neuropathy are also affecting patient's functional outcome.   REHAB POTENTIAL: Good  CLINICAL DECISION MAKING: Stable/uncomplicated  EVALUATION COMPLEXITY: Low   GOALS:   SHORT TERM GOALS: Target date:  06/05/2024   Pt will be independent and compliant with HEP for improved strength, balance, stability, and mobility.  Baseline: Goal status: GOAL MET 06/27/24  2.  Pt will demo improved time on TUG test by at least 5 seconds for improved mobility and reduced fall risk.   Baseline:  Goal status: 80% MET  3.  Pt will report at least a 25% improvement in her balance.   Baseline:  Goal status: GOAL MET  06/27/24  4.  Pt will report at least a 25% improvement in daily usage of R UE without adverse effects.  Goal Status:  INITIAL Target date: 10/09/2024  5.  Pt will demo at least a 12-15 deg increase in R shoulder flexion AROM and a 5-7 deg increase in R elbow extension AROM for improved performance of reaching  and performance of daily activities.  Goal Status:  INITIAL Target date: 10/16/2024      LONG TERM GOALS: Target date:  11/14/2024   Pt will score at least 44/56 on the Berg Balance Assessment for improved balance and stability with daily tasks and mobility.  Baseline:  35/56 initial,  40/56 on 11/6 Goal status: NOT MET  2.  Pt will be able to load and unload her dishwasher with good stability and good confidence.  Baseline:  Goal status: GOAL MET  with holding onto counter  9/4  3.  Pt will demo improved bilat LE strength to 4+/5 in hip flexion, 5/5 in knee extension, 4-/5 in R  ankle DF, and a 5# increase in L hip abduction for improved performance of functional mobility and performance of household chores.  Baseline:  Goal status:  ONGOING  4.  Pt will be able to ascend and descend stairs with a reciprocal gait with the rail.   Baseline:  Goal status: PROGRESSING  9/4  5.  Pt will report she is able to perform her normal ambulation with cane with good stability and confidence and without LOB. Baseline:  Goal status:ONGOING  6.  Pt will report she is able to perform her normal reaching activities including reaching into an overhead cabinet without significant difficulty and pain.   Goal status:  INITIAL  7.  Pt will be able to dress without difficulty.   Goal status:  INITIAL  8.  Pt will be able to perform her ADLs without significant shoulder pain and difficulty.   Goal status:  INITIAL  9.  Pt will report at least a 50% improvement in balance with turning her head and also closing heavy doors.   Goal status:  INITIAL   PLAN:  PT FREQUENCY: 2x/wk  PT DURATION: 5 weeks  PLANNED INTERVENTIONS: 97164- PT Re-evaluation, 97750- Physical Performance Testing, 97110-Therapeutic exercises, 97530- Therapeutic activity, [id number]- Neuromuscular re-education, 97535- Self Care, [id number]- Manual therapy, [phone]- Gait training, [phone]- Aquatic Therapy, [phone]- Electrical stimulation  (unattended), Patient/Family education, Balance training, Stair training, Taping, DME instructions, Cryotherapy, and Moist heat  PLAN FOR NEXT SESSION: Work on [employer].  LE strengthening and balance activities.  Work on step ups and step downs.  Cont with shoulder ROM.  Perform pulleys   Leigh Minerva III PT, DPT 10/10/24 8:10 PM

## 2024-10-14 ENCOUNTER — Ambulatory Visit: Attending: Cardiology | Admitting: Cardiology

## 2024-10-14 ENCOUNTER — Encounter: Payer: Self-pay | Admitting: Cardiology

## 2024-10-14 VITALS — BP 128/84 | HR 81 | Ht 63.0 in | Wt 175.0 lb

## 2024-10-14 DIAGNOSIS — R072 Precordial pain: Secondary | ICD-10-CM | POA: Diagnosis not present
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
## 2024-10-14 NOTE — Patient Instructions (Signed)
 Follow-Up: At [facility], you and your health needs are our priority.  As part of our continuing mission to provide you with exceptional heart care, our providers are all part of one team.  This team includes your primary Cardiologist (physician) and Advanced Practice Providers or APPs (Physician Assistants and Nurse Practitioners) who all work together to provide you with the care you need, when you need it.  Your next appointment:   As needed  Provider:   Cody Das, MD

## 2024-10-16 ENCOUNTER — Ambulatory Visit (HOSPITAL_BASED_OUTPATIENT_CLINIC_OR_DEPARTMENT_OTHER): Payer: Self-pay | Admitting: Physical Therapy

## 2024-10-16 DIAGNOSIS — M25511 Pain in right shoulder: Secondary | ICD-10-CM

## 2024-10-16 DIAGNOSIS — M25611 Stiffness of right shoulder, not elsewhere classified: Secondary | ICD-10-CM

## 2024-10-16 DIAGNOSIS — R2689 Other abnormalities of gait and mobility: Secondary | ICD-10-CM | POA: Diagnosis not present

## 2024-10-16 DIAGNOSIS — R2681 Unsteadiness on feet: Secondary | ICD-10-CM

## 2024-10-16 DIAGNOSIS — M6281 Muscle weakness (generalized): Secondary | ICD-10-CM

## 2024-10-16 NOTE — Therapy (Signed)
 OUTPATIENT PHYSICAL THERAPY SHOULDER TREATMENT       Patient Name: Hailey Perkins MRN: [id number] DOB:07/30/1935, 89 y.o., female Today's Date: 10/17/2024  END OF SESSION:  PT End of Session - 10/16/24 1202     Visit Number 27    Number of Visits 36    Date for Recertification  11/14/24    Authorization Type [employer] MCR    PT Start Time 1156    PT Stop Time 1238    PT Time Calculation (min) 42 min    Activity Tolerance Patient tolerated treatment well    Behavior During Therapy WFL for tasks assessed/performed                     Past Medical History:  Diagnosis Date   Aortic atherosclerosis    Arthritis    Cataract 2010   bilateral eyes   Chicken pox    Cholelithiasis    Diverticulitis    Diverticulosis    GERD (gastroesophageal reflux disease)    Glaucoma    Hiatal hernia    History of frequent urinary tract infections    Hyperlipidemia    Osteopenia    Peripheral neuropathy    Sleep apnea    wears a CPAP   Urine incontinence    Past Surgical History:  Procedure Laterality Date   ABDOMINAL HYSTERECTOMY  1977   ANAL FISTULECTOMY  1985   ANKLE FRACTURE SURGERY Right 2014   APPENDECTOMY     BIOPSY  02/15/2021   Procedure: BIOPSY;  Surgeon: Wilhelmenia Aloha Raddle., MD;  Location: [facility];  Service: Gastroenterology;;   BREAST BIOPSY Bilateral 10+ yrs ago   BENIGN   BUNIONECTOMY Left 1975   Left Toe   CATARACT EXTRACTION, BILATERAL  2009   COLONOSCOPY WITH PROPOFOL  N/A 02/15/2021   Procedure: COLONOSCOPY WITH PROPOFOL ;  Surgeon: Wilhelmenia Aloha Raddle., MD;  Location: [facility];  Service: Gastroenterology;  Laterality: N/A;  ultraslimscope    DILATION AND CURETTAGE OF UTERUS  1972   eye PRK vision correction Left 10/09/2013   eye socket re-formed Left 2014   FOOT NEUROMA SURGERY Right 2003   plus bunionectomy, right great toe   HAMMER TOE SURGERY Right 2005   Right foot correction hammertoe little toe   HEMORRHOID SURGERY  1967    HIP SURGERY Right 2014   right hip femur pin implanted   KNEE ARTHROSCOPY Left 2010   debridement left knee scar tissue   LAPAROSCOPIC OOPHERECTOMY     1986, 1990   LASIK  1998   NEUROMA SURGERY Right 2003   Right Foot Neuroma, plus Bunionectomy, Right Great Toe   POLYPECTOMY  02/15/2021   Procedure: POLYPECTOMY;  Surgeon: Wilhelmenia Aloha Raddle., MD;  Location: [facility];  Service: Gastroenterology;;   RECTOCELE REPAIR  1968   REPLACEMENT TOTAL KNEE Left 01/2008   REPLACEMENT TOTAL KNEE Right 08/2008   retinal pucker correction Left 07/14/2012   schwannoma tumor (benign) removal  2011   right rib   SIGMOIDOSCOPY     TONSILLECTOMY AND ADENOIDECTOMY     VENTRAL HERNIA REPAIR  1993   Patient Active Problem List   Diagnosis Date Noted   Precordial chest pain 10/14/2024   Arthralgia of right elbow 08/24/2022   Bilateral wrist pain 08/24/2022   Neck pain 08/24/2022   Pain of right hip joint 08/24/2022   Chronic pain of right knee 05/27/2021   OSA on CPAP 05/27/2021   LLQ pain 12/16/2020   History of diverticulitis 12/16/2020  Hematochezia 12/16/2020   Abnormal colonoscopy 12/16/2020   Acquired trigger finger of right index finger 10/26/2020   Other specified arthritis, right hand 10/26/2020   Diverticulitis of colon 08/15/2020   Dry eyes, bilateral 10/10/2019   Sleep-related hypoxia 08/05/2019   OSA (obstructive sleep apnea) 08/05/2019   PLMD (periodic limb movement disorder) 08/05/2019   Cerebellar 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) 05/06/2019   Excessive daytime sleepiness 05/06/2019   Loud snoring 05/06/2019   Ventral hernia without obstruction or gangrene 02/22/2019   Chronic throat clearing 01/18/2019   Nocturnal cough 01/18/2019   Family history of colon cancer in father 01/18/2019   Diverticulosis 01/18/2019   Gait abnormality 12/27/2018   Numbness 12/27/2018   Status post total bilateral knee replacement 11/30/2018   DDD (degenerative disc  disease), cervical 11/30/2018   Status post carpal tunnel release 11/30/2018   DDD (degenerative disc disease), lumbar 10/26/2018   Primary osteoarthritis of both hands 10/26/2018   Primary osteoarthritis of both feet 10/26/2018   Gastroesophageal reflux disease 08/27/2018   Bilateral impacted cerumen 08/21/2018   Sensorineural hearing loss (SNHL) of both ears 08/21/2018   Tinnitus, bilateral 08/21/2018   Blood in urine 04/17/2018   Secondary open-angle glaucoma of left eye, moderate stage 03/10/2018   Early stage 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 10/05/2017   Epiretinal membrane (ERM) of left eye 10/05/2017   Pseudophakia of both eyes 10/05/2017   Osteopenia 03/19/2017   Mixed hyperlipidemia 08/18/2016   Generalized osteoarthritis of multiple sites 08/18/2016   Peripheral neuropathic pain 08/18/2016   Overactive bladder 08/18/2016   Glaucoma 08/18/2016   Cervical disc disorder with radiculopathy of cervical region 08/17/2016   Carpal tunnel syndrome, right upper limb 08/17/2016   Abnormal finding on mammography 06/14/2016   Breast lump 05/06/2015    PCP: Yolande Toribio MATSU, MD  REFERRING PROVIDER:  Yolande Toribio MATSU, MD   REFERRING DIAG:  R26.81 (ICD-10-CM) - Unsteadiness on feet/Unsteady gait   THERAPY DIAG:  Other abnormalities of gait and mobility  Unsteadiness on feet  Muscle weakness (generalized)  Right shoulder pain, unspecified chronicity  Stiffness of right shoulder, not elsewhere classified  Rationale for Evaluation and Treatment: Rehabilitation  ONSET DATE: 2021 ; PT order 04/04/2024  SUBJECTIVE:   SUBJECTIVE STATEMENT: Pt denies any adverse effects after prior treatment.  Pt states her shoulder is doing better.  Pt reports improved ROM with supine wand flexion.  Pt reports she feels her balance go bwds with staggered stance standing on the 2nd set.  She performs staggered stance in front of her counter.  Pt has her new rollator.   Pt has questions concerning the different HEP's in her Med[employer] app.     PERTINENT HISTORY: Pt uses a SPC with walking.  Flexor tenotomy on 3 toes of R foot on 05/01/24--Pt reports she has no restrictions from MD. Arthritis, osteopenia, peripheral neuropathy Bilat TKR  PAIN:  R shoulder:  no pain Pt states she had some pain in shoulder earlier this AM.     PRECAUTIONS: Fall and Other: peripheral neuropathy   WEIGHT BEARING RESTRICTIONS: No  FALLS:  Has patient fallen in last 6 months? No  LIVING ENVIRONMENT: Lives with: lives alone Lives in:  1 story home Stairs: No Has following equipment at home: Single point cane, shower chair, and Grab bars  OCCUPATION:  Retired  PLOF: Independent  PATIENT GOALS: improved ability to pivot.  Improved walking on uneven terrain and performing stairs   OBJECTIVE:  Note: Objective measures  were completed at Evaluation unless otherwise noted.  DIAGNOSTIC FINDINGS: Pt reports x rays were negative for fracture.   TREATMENT DATE:   11/12   PT answered questions concerning how to use her app for her HEP.  PT demonstrated and instructed pt in using HEP.  PT educated pt with proper height of handles on rollator and instructed pt in proper usage of the rollator.  PT instructed and demonstrated how to use her rollator with transfers.  Pt ambulated with rollator in the clinic.   Pulleys in flexion and scaption Staggered stance standing 2x30 sec with rail assist as needed with R LE back Alt toe taps with UE support on 6 inch step x10 Step ups on 4 inch step x 10 reps bilat with rail Step downs on 4 inch step x 5 reps bilat with rail Standing on airex with NBOS 2x30-35 sec                                                                                                                                  PATIENT EDUCATION:  Education details:  HEP, walker height, rationale of interventions, POC, dx, exercise form, and relevant anatomy.   PT answered pt's questions.   Person educated: Patient Education method: Explanation, demonstration, verbal cues Education comprehension: verbalized understanding and needs further education, verbal cues required, returned demonstration  HOME EXERCISE PROGRAM:   Access Code: [email or web address] URL: [email or web address] Date: 09/17/2024 Prepared by: Mose Minerva   ASSESSMENT:  CLINICAL IMPRESSION: Pt enters the clinic with rollator.  Pt's rollator was set up too low.  PT increased handle height to appropriate height and educated pt.  PT had pt ambulate with rollator to ensure she felt comfortable with new setting and she states it felt better.  PT instructed and demonstrated correct performance and appropriate usage of the rollator with mobility including transfers and using the brakes.  Pt had questions concerning her HEP in the [employer] app.  PT instructed pt how to find her HEP's in the app and answered all questions satisfactorily.  Pt had to use the rail for support intermittently with R LE back with staggered stance standing, but not with L LE back.  PT worked on step ups and step downs on a low step today with UE support.  She responded well to treatment and had no c/o's after treatment.    OBJECTIVE IMPAIRMENTS: Abnormal gait, decreased activity tolerance, decreased balance, decreased endurance, decreased mobility, difficulty walking, and decreased strength.   ACTIVITY LIMITATIONS: standing, squatting, stairs, transfers, and locomotion level  PARTICIPATION LIMITATIONS: cleaning and community activity  PERSONAL FACTORS: 3+ comorbidities: Arthritis, bilat TKA, peripheral neuropathy are also affecting patient's functional outcome.   REHAB POTENTIAL: Good  CLINICAL DECISION MAKING: Stable/uncomplicated  EVALUATION COMPLEXITY: Low   GOALS:   SHORT TERM GOALS: Target date:  06/05/2024   Pt will be independent and compliant with HEP for improved strength, balance,  stability, and mobility.  Baseline: Goal status: GOAL MET 06/27/24  2.  Pt will demo improved time on TUG test by at least 5 seconds for improved mobility and reduced fall risk.   Baseline:  Goal status: 80% MET  3.  Pt will report at least a 25% improvement in her balance.   Baseline:  Goal status: GOAL MET  06/27/24  4.  Pt will report at least a 25% improvement in daily usage of R UE without adverse effects.  Goal Status:  INITIAL Target date: 10/09/2024  5.  Pt will demo at least a 12-15 deg increase in R shoulder flexion AROM and a 5-7 deg increase in R elbow extension AROM for improved performance of reaching and performance of daily activities.  Goal Status:  INITIAL Target date: 10/16/2024      LONG TERM GOALS: Target date:  11/14/2024   Pt will score at least 44/56 on the Berg Balance Assessment for improved balance and stability with daily tasks and mobility.  Baseline:  35/56 initial,  40/56 on 11/6 Goal status: NOT MET  2.  Pt will be able to load and unload her dishwasher with good stability and good confidence.  Baseline:  Goal status: GOAL MET  with holding onto counter  9/4  3.  Pt will demo improved bilat LE strength to 4+/5 in hip flexion, 5/5 in knee extension, 4-/5 in R ankle DF, and a 5# increase in L hip abduction for improved performance of functional mobility and performance of household chores.  Baseline:  Goal status:  ONGOING  4.  Pt will be able to ascend and descend stairs with a reciprocal gait with the rail.   Baseline:  Goal status: PROGRESSING  9/4  5.  Pt will report she is able to perform her normal ambulation with cane with good stability and confidence and without LOB. Baseline:  Goal status:ONGOING  6.  Pt will report she is able to perform her normal reaching activities including reaching into an overhead cabinet without significant difficulty and pain.   Goal status:  INITIAL  7.  Pt will be able to dress without difficulty.    Goal status:  INITIAL  8.  Pt will be able to perform her ADLs without significant shoulder pain and difficulty.   Goal status:  INITIAL  9.  Pt will report at least a 50% improvement in balance with turning her head and also closing heavy doors.   Goal status:  INITIAL   PLAN:  PT FREQUENCY: 2x/wk  PT DURATION: 5 weeks  PLANNED INTERVENTIONS: [id number]- PT Re-evaluation, 97750- Physical Performance Testing, 97110-Therapeutic exercises, 97530- Therapeutic activity, [id number]- Neuromuscular re-education, 97535- Self Care, [id number]- Manual therapy, [phone]- Gait training, [phone]- Aquatic Therapy, [phone]- Electrical stimulation (unattended), Patient/Family education, Balance training, Stair training, Taping, DME instructions, Cryotherapy, and Moist heat  PLAN FOR NEXT SESSION: Work on [employer].  LE strengthening and balance activities.  Work on step ups and step downs.  Cont with shoulder ROM.  Perform pulleys   Leigh Minerva III PT, DPT 10/17/24 5:29 PM

## 2024-10-17 ENCOUNTER — Encounter (HOSPITAL_BASED_OUTPATIENT_CLINIC_OR_DEPARTMENT_OTHER): Payer: Self-pay | Admitting: Physical Therapy

## 2024-10-24 ENCOUNTER — Encounter (HOSPITAL_BASED_OUTPATIENT_CLINIC_OR_DEPARTMENT_OTHER): Payer: Self-pay | Admitting: Physical Therapy

## 2024-10-24 ENCOUNTER — Ambulatory Visit (HOSPITAL_BASED_OUTPATIENT_CLINIC_OR_DEPARTMENT_OTHER): Payer: Self-pay | Admitting: Physical Therapy

## 2024-10-24 DIAGNOSIS — M25511 Pain in right shoulder: Secondary | ICD-10-CM

## 2024-10-24 DIAGNOSIS — M25611 Stiffness of right shoulder, not elsewhere classified: Secondary | ICD-10-CM

## 2024-10-24 DIAGNOSIS — R2689 Other abnormalities of gait and mobility: Secondary | ICD-10-CM | POA: Diagnosis not present

## 2024-10-24 DIAGNOSIS — M6281 Muscle weakness (generalized): Secondary | ICD-10-CM

## 2024-10-24 DIAGNOSIS — R2681 Unsteadiness on feet: Secondary | ICD-10-CM

## 2024-10-24 NOTE — Therapy (Signed)
 OUTPATIENT PHYSICAL THERAPY SHOULDER TREATMENT       Patient Name: Hailey Perkins MRN: [id number] DOB:[birth year], 89 y.o., female Today's Date: 10/25/2024  END OF SESSION:  PT End of Session - 10/24/24 1545     Visit Number 28    Number of Visits 36    Date for Recertification  11/14/24    Authorization Type [employer] MCR    PT Start Time 1539    PT Stop Time 1619    PT Time Calculation (min) 40 min    Activity Tolerance Patient tolerated treatment well    Behavior During Therapy WFL for tasks assessed/performed                      Past Medical History:  Diagnosis Date   Aortic atherosclerosis    Arthritis    Cataract 2010   bilateral eyes   Chicken pox    Cholelithiasis    Diverticulitis    Diverticulosis    GERD (gastroesophageal reflux disease)    Glaucoma    Hiatal hernia    History of frequent urinary tract infections    Hyperlipidemia    Osteopenia    Peripheral neuropathy    Sleep apnea    wears a CPAP   Urine incontinence    Past Surgical History:  Procedure Laterality Date   ABDOMINAL HYSTERECTOMY  1977   ANAL FISTULECTOMY  1985   ANKLE FRACTURE SURGERY Right 2014   APPENDECTOMY     BIOPSY  02/15/2021   Procedure: BIOPSY;  Surgeon: Wilhelmenia Aloha Raddle., MD;  Location: [facility];  Service: Gastroenterology;;   BREAST BIOPSY Bilateral 10+ yrs ago   BENIGN   BUNIONECTOMY Left 1975   Left Toe   CATARACT EXTRACTION, BILATERAL  2009   COLONOSCOPY WITH PROPOFOL  N/A 02/15/2021   Procedure: COLONOSCOPY WITH PROPOFOL ;  Surgeon: Wilhelmenia Aloha Raddle., MD;  Location: [facility];  Service: Gastroenterology;  Laterality: N/A;  ultraslimscope    DILATION AND CURETTAGE OF UTERUS  1972   eye PRK vision correction Left 10/09/2013   eye socket re-formed Left 2014   FOOT NEUROMA SURGERY Right 2003   plus bunionectomy, right great toe   HAMMER TOE SURGERY Right 2005   Right foot correction hammertoe little toe   HEMORRHOID SURGERY  1967    HIP SURGERY Right 2014   right hip femur pin implanted   KNEE ARTHROSCOPY Left 2010   debridement left knee scar tissue   LAPAROSCOPIC OOPHERECTOMY     1986, 1990   LASIK  1998   NEUROMA SURGERY Right 2003   Right Foot Neuroma, plus Bunionectomy, Right Great Toe   POLYPECTOMY  02/15/2021   Procedure: POLYPECTOMY;  Surgeon: Wilhelmenia Aloha Raddle., MD;  Location: [facility];  Service: Gastroenterology;;   RECTOCELE REPAIR  1968   REPLACEMENT TOTAL KNEE Left 01/2008   REPLACEMENT TOTAL KNEE Right 08/2008   retinal pucker correction Left 07/14/2012   schwannoma tumor (benign) removal  2011   right rib   SIGMOIDOSCOPY     TONSILLECTOMY AND ADENOIDECTOMY     VENTRAL HERNIA REPAIR  1993   Patient Active Problem List   Diagnosis Date Noted   Precordial chest pain 10/14/2024   Arthralgia of right elbow 08/24/2022   Bilateral wrist pain 08/24/2022   Neck pain 08/24/2022   Pain of right hip joint 08/24/2022   Chronic pain of right knee 05/27/2021   OSA on CPAP 05/27/2021   LLQ pain 12/16/2020   History of diverticulitis  12/16/2020   Hematochezia 12/16/2020   Abnormal colonoscopy 12/16/2020   Acquired trigger finger of right index finger 10/26/2020   Other specified arthritis, right hand 10/26/2020   Diverticulitis of colon 08/15/2020   Dry eyes, bilateral 10/10/2019   Sleep-related hypoxia 08/05/2019   OSA (obstructive sleep apnea) 08/05/2019   PLMD (periodic limb movement disorder) 08/05/2019   Cerebellar 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) 05/06/2019   Excessive daytime sleepiness 05/06/2019   Loud snoring 05/06/2019   Ventral hernia without obstruction or gangrene 02/22/2019   Chronic throat clearing 01/18/2019   Nocturnal cough 01/18/2019   Family history of colon cancer in father 01/18/2019   Diverticulosis 01/18/2019   Gait abnormality 12/27/2018   Numbness 12/27/2018   Status post total bilateral knee replacement 11/30/2018   DDD (degenerative disc  disease), cervical 11/30/2018   Status post carpal tunnel release 11/30/2018   DDD (degenerative disc disease), lumbar 10/26/2018   Primary osteoarthritis of both hands 10/26/2018   Primary osteoarthritis of both feet 10/26/2018   Gastroesophageal reflux disease 08/27/2018   Bilateral impacted cerumen 08/21/2018   Sensorineural hearing loss (SNHL) of both ears 08/21/2018   Tinnitus, bilateral 08/21/2018   Blood in urine 04/17/2018   Secondary open-angle glaucoma of left eye, moderate stage 03/10/2018   Early stage 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 10/05/2017   Epiretinal membrane (ERM) of left eye 10/05/2017   Pseudophakia of both eyes 10/05/2017   Osteopenia 03/19/2017   Mixed hyperlipidemia 08/18/2016   Generalized osteoarthritis of multiple sites 08/18/2016   Peripheral neuropathic pain 08/18/2016   Overactive bladder 08/18/2016   Glaucoma 08/18/2016   Cervical disc disorder with radiculopathy of cervical region 08/17/2016   Carpal tunnel syndrome, right upper limb 08/17/2016   Abnormal finding on mammography 06/14/2016   Breast lump 05/06/2015    PCP: Yolande Toribio MATSU, MD  REFERRING PROVIDER:  Yolande Toribio MATSU, MD   REFERRING DIAG:  R26.81 (ICD-10-CM) - Unsteadiness on feet/Unsteady gait   THERAPY DIAG:  Other abnormalities of gait and mobility  Unsteadiness on feet  Muscle weakness (generalized)  Right shoulder pain, unspecified chronicity  Stiffness of right shoulder, not elsewhere classified  Rationale for Evaluation and Treatment: Rehabilitation  ONSET DATE: 2021 ; PT order 04/04/2024  SUBJECTIVE:   SUBJECTIVE STATEMENT: Pt denies any adverse effects after prior treatment.  Pt feels good using her rollator.  Pt walked 9 laps upstairs on the track with rollator.  Pt used the elevator to get to the 2nd floor.       PERTINENT HISTORY: Pt uses a SPC with walking.  Flexor tenotomy on 3 toes of R foot on 05/01/24--Pt reports she has  no restrictions from MD. Arthritis, osteopenia, peripheral neuropathy Bilat TKR  PAIN:  R shoulder:  3/10     PRECAUTIONS: Fall and Other: peripheral neuropathy   WEIGHT BEARING RESTRICTIONS: No  FALLS:  Has patient fallen in last 6 months? No  LIVING ENVIRONMENT: Lives with: lives alone Lives in:  1 story home Stairs: No Has following equipment at home: Single point cane, shower chair, and Grab bars  OCCUPATION:  Retired  PLOF: Independent  PATIENT GOALS: improved ability to pivot.  Improved walking on uneven terrain and performing stairs   OBJECTIVE:  Note: Objective measures were completed at Evaluation unless otherwise noted.  DIAGNOSTIC FINDINGS: Pt reports x rays were negative for fracture.   TREATMENT DATE:   11/20   Pulleys in flexion and scaption Seated elbow flexion/extension AROM  2x10 Standing ladder  walks x 5 reps Staggered stance standing 2x30 sec with rail assist as needed  Half tandem 2x30 sec with rail assist as needed Step ups and step downs on 4 inch step x 10 reps bilat with rail Step ups on 6 inch step x10 reps bilat with rail Step downs on 4 inch step x 5 reps bilat with rail Sit to stands with GTB around LE's 2x5 reps with PT assistance Tandem gait with rail                                                                                                                                 PATIENT EDUCATION:  Education details:  HEP, rationale of interventions, POC, dx, exercise form, and relevant anatomy.  PT answered pt's questions.   Person educated: Patient Education method: Explanation, demonstration, verbal cues Education comprehension: verbalized understanding and needs further education, verbal cues required, returned demonstration  HOME EXERCISE PROGRAM:   Access Code: [email or web address] URL: [email or web address] Date: 09/17/2024 Prepared by: Mose Minerva   ASSESSMENT:  CLINICAL IMPRESSION: Pt enters the clinic  with rollator.  Pt has walked around the track upstairs with her rollator.  PT did remind pt of locking brakes of rollator with sit/stand transfer.  Pt required multiple attempts on a few reps of sit to stands with GTB around LE's.  Pt had a LOB with sit to stand requiring PT assistance to correct.  Pt had improved balance with staggered stance standing today though did use the rail for support at times.  Pt also had improved balance with half tandem stance.  PT worked on step ups and step downs on a low step today with UE support for improved performance of stairs and curbs.  She responded well to treatment and had no c/o's after treatment.    OBJECTIVE IMPAIRMENTS: Abnormal gait, decreased activity tolerance, decreased balance, decreased endurance, decreased mobility, difficulty walking, and decreased strength.   ACTIVITY LIMITATIONS: standing, squatting, stairs, transfers, and locomotion level  PARTICIPATION LIMITATIONS: cleaning and community activity  PERSONAL FACTORS: 3+ comorbidities: Arthritis, bilat TKA, peripheral neuropathy are also affecting patient's functional outcome.   REHAB POTENTIAL: Good  CLINICAL DECISION MAKING: Stable/uncomplicated  EVALUATION COMPLEXITY: Low   GOALS:   SHORT TERM GOALS: Target date:  06/05/2024   Pt will be independent and compliant with HEP for improved strength, balance, stability, and mobility.  Baseline: Goal status: GOAL MET 06/27/24  2.  Pt will demo improved time on TUG test by at least 5 seconds for improved mobility and reduced fall risk.   Baseline:  Goal status: 80% MET  3.  Pt will report at least a 25% improvement in her balance.   Baseline:  Goal status: GOAL MET  06/27/24  4.  Pt will report at least a 25% improvement in daily usage of R UE without adverse effects.  Goal Status:  INITIAL Target date: 10/09/2024  5.  Pt will demo at least a 12-15  deg increase in R shoulder flexion AROM and a 5-7 deg increase in R elbow extension  AROM for improved performance of reaching and performance of daily activities.  Goal Status:  INITIAL Target date: 10/16/2024      LONG TERM GOALS: Target date:  11/14/2024   Pt will score at least 44/56 on the Berg Balance Assessment for improved balance and stability with daily tasks and mobility.  Baseline:  35/56 initial,  40/56 on 11/6 Goal status: NOT MET  2.  Pt will be able to load and unload her dishwasher with good stability and good confidence.  Baseline:  Goal status: GOAL MET  with holding onto counter  9/4  3.  Pt will demo improved bilat LE strength to 4+/5 in hip flexion, 5/5 in knee extension, 4-/5 in R ankle DF, and a 5# increase in L hip abduction for improved performance of functional mobility and performance of household chores.  Baseline:  Goal status:  ONGOING  4.  Pt will be able to ascend and descend stairs with a reciprocal gait with the rail.   Baseline:  Goal status: PROGRESSING  9/4  5.  Pt will report she is able to perform her normal ambulation with cane with good stability and confidence and without LOB. Baseline:  Goal status:ONGOING  6.  Pt will report she is able to perform her normal reaching activities including reaching into an overhead cabinet without significant difficulty and pain.   Goal status:  INITIAL  7.  Pt will be able to dress without difficulty.   Goal status:  INITIAL  8.  Pt will be able to perform her ADLs without significant shoulder pain and difficulty.   Goal status:  INITIAL  9.  Pt will report at least a 50% improvement in balance with turning her head and also closing heavy doors.   Goal status:  INITIAL   PLAN:  PT FREQUENCY: 2x/wk  PT DURATION: 5 weeks  PLANNED INTERVENTIONS: 97164- PT Re-evaluation, 97750- Physical Performance Testing, 97110-Therapeutic exercises, 97530- Therapeutic activity, [id number]- Neuromuscular re-education, 97535- Self Care, [id number]- Manual therapy, [phone]- Gait training, [phone]- Aquatic  Therapy, [phone]- Electrical stimulation (unattended), Patient/Family education, Balance training, Stair training, Taping, DME instructions, Cryotherapy, and Moist heat  PLAN FOR NEXT SESSION: Work on [employer].  LE strengthening and balance activities.  Work on step ups and step downs.  Cont with shoulder ROM.  Perform pulleys   Leigh Minerva III PT, DPT 10/25/24 1:30 PM

## 2024-10-28 ENCOUNTER — Ambulatory Visit: Admitting: Podiatry

## 2024-10-28 ENCOUNTER — Telehealth: Payer: Self-pay | Admitting: Family Medicine

## 2024-10-28 ENCOUNTER — Encounter: Payer: Self-pay | Admitting: Podiatry

## 2024-10-28 DIAGNOSIS — L600 Ingrowing nail: Secondary | ICD-10-CM

## 2024-10-28 NOTE — Telephone Encounter (Signed)
 LVM for pt to call back to schedule  CPAP auth: [id number] (exp. 10/10/24 to 01/06/25)

## 2024-10-28 NOTE — Patient Instructions (Signed)
 Ingrown Toenail  An ingrown toenail occurs when the corner or sides of a toenail grow into the surrounding skin. This causes discomfort and pain. The big toe is most commonly affected, but any of the toes can be affected. If an ingrown toenail is not treated, it can become infected. What are the causes? This condition may be caused by: Wearing shoes that are too small or tight. An injury, such as stubbing your toe or having your toe stepped on. Improper cutting or care of your toenails. Having nail or foot abnormalities that were present from birth (congenital abnormalities), such as having a nail that is too big for your toe. What increases the risk? The following factors may make you more likely to develop ingrown toenails: Age. Nails tend to get thicker with age, so ingrown nails are more common among older people. Cutting your toenails incorrectly, such as cutting them very short or cutting them unevenly. An ingrown toenail is more likely to get infected if you have: Diabetes. Blood flow (circulation) problems. What are the signs or symptoms? Symptoms of an ingrown toenail may include: Pain, soreness, or tenderness. Redness. Swelling. Hardening of the skin that surrounds the toenail. Signs that an ingrown toenail may be infected include: Fluid or pus. Symptoms that get worse. How is this diagnosed? Ingrown toenails may be diagnosed based on: Your symptoms and medical history. A physical exam. Labs or tests. If you have fluid or blood coming from your toenail, a sample may be collected to test for the specific type of bacteria that is causing the infection. How is this treated? Treatment depends on the severity of your symptoms. You may be able to care for your toenail at home. If you have an infection, you may be prescribed antibiotic medicines. If you have fluid or pus draining from your toenail, your health care provider may drain it. If you have trouble walking, you may be  given crutches to use. If you have a severe or infected ingrown toenail, you may need a procedure to remove part or all of the nail. Follow these instructions at home: Foot care  Check your wound every day for signs of infection, or as often as told by your health care provider. Check for: More redness, swelling, or pain. More fluid or blood. Warmth. Pus or a bad smell. Do not pick at your toenail or try to remove it yourself. Soak your foot in warm, soapy water. Do this for 20 minutes, 3 times a day, or as often as told by your health care provider. This helps to keep your toe clean and your skin soft. Wear shoes that fit well and are not too tight. Your health care provider may recommend that you wear open-toed shoes while you heal. Trim your toenails regularly and carefully. Cut your toenails straight across to prevent injury to the skin at the corners of the toenail. Do not cut your nails in a curved shape. Keep your feet clean and dry to help prevent infection. General instructions Take over-the-counter and prescription medicines only as told by your health care provider. If you were prescribed an antibiotic, take it as told by your health care provider. Do not stop taking the antibiotic even if you start to feel better. If your health care provider told you to use crutches to help you move around, use them as instructed. Return to your normal activities as told by your health care provider. Ask your health care provider what activities are safe for you.  Keep all follow-up visits. This is important. Contact a health care provider if: You have more redness, swelling, pain, or other symptoms that do not improve with treatment. You have fluid, blood, or pus coming from your toenail. You have a red streak on your skin that starts at your foot and spreads up your leg. You have a fever. Summary An ingrown toenail occurs when the corner or sides of a toenail grow into the surrounding skin.  This causes discomfort and pain. The big toe is most commonly affected, but any of the toes can be affected. If an ingrown toenail is not treated, it can become infected. Fluid or pus draining from your toenail is a sign of infection. Your health care provider may need to drain it. You may be given antibiotics to treat the infection. Trimming your toenails regularly and properly can help you prevent an ingrown toenail. This information is not intended to replace advice given to you by your health care provider. Make sure you discuss any questions you have with your health care provider. Document Revised: 03/23/2021 Document Reviewed: 03/23/2021 Elsevier Patient Education  2024 [employer].

## 2024-10-29 ENCOUNTER — Encounter (HOSPITAL_BASED_OUTPATIENT_CLINIC_OR_DEPARTMENT_OTHER): Payer: Self-pay | Admitting: Physical Therapy

## 2024-10-29 ENCOUNTER — Ambulatory Visit (HOSPITAL_BASED_OUTPATIENT_CLINIC_OR_DEPARTMENT_OTHER): Admitting: Physical Therapy

## 2024-10-29 DIAGNOSIS — M25511 Pain in right shoulder: Secondary | ICD-10-CM

## 2024-10-29 DIAGNOSIS — R2689 Other abnormalities of gait and mobility: Secondary | ICD-10-CM | POA: Diagnosis not present

## 2024-10-29 DIAGNOSIS — R2681 Unsteadiness on feet: Secondary | ICD-10-CM

## 2024-10-29 DIAGNOSIS — M6281 Muscle weakness (generalized): Secondary | ICD-10-CM

## 2024-10-29 DIAGNOSIS — M25611 Stiffness of right shoulder, not elsewhere classified: Secondary | ICD-10-CM

## 2024-10-29 NOTE — Therapy (Signed)
 OUTPATIENT PHYSICAL THERAPY SHOULDER TREATMENT       Patient Name: Hailey Perkins MRN: [id number] DOB:August 29, 1935, 89 y.o., female Today's Date: 10/29/2024  END OF SESSION:  PT End of Session - 10/29/24 1322     Visit Number 29    Number of Visits 36    Date for Recertification  11/14/24    Authorization Type BCBS MCR    PT Start Time 1320                      Past Medical History:  Diagnosis Date   Aortic atherosclerosis    Arthritis    Cataract 2010   bilateral eyes   Chicken pox    Cholelithiasis    Diverticulitis    Diverticulosis    GERD (gastroesophageal reflux disease)    Glaucoma    Hiatal hernia    History of frequent urinary tract infections    Hyperlipidemia    Osteopenia    Peripheral neuropathy    Sleep apnea    wears a CPAP   Urine incontinence    Past Surgical History:  Procedure Laterality Date   ABDOMINAL HYSTERECTOMY  1977   ANAL FISTULECTOMY  1985   ANKLE FRACTURE SURGERY Right 2014   APPENDECTOMY     BIOPSY  02/15/2021   Procedure: BIOPSY;  Surgeon: Wilhelmenia Aloha Raddle., MD;  Location: [facility];  Service: Gastroenterology;;   BREAST BIOPSY Bilateral 10+ yrs ago   BENIGN   BUNIONECTOMY Left 1975   Left Toe   CATARACT EXTRACTION, BILATERAL  2009   COLONOSCOPY WITH PROPOFOL  N/A 02/15/2021   Procedure: COLONOSCOPY WITH PROPOFOL ;  Surgeon: Wilhelmenia Aloha Raddle., MD;  Location: [facility];  Service: Gastroenterology;  Laterality: N/A;  ultraslimscope    DILATION AND CURETTAGE OF UTERUS  1972   eye PRK vision correction Left 10/09/2013   eye socket re-formed Left 2014   FOOT NEUROMA SURGERY Right 2003   plus bunionectomy, right great toe   HAMMER TOE SURGERY Right 2005   Right foot correction hammertoe little toe   HEMORRHOID SURGERY  1967   HIP SURGERY Right 2014   right hip femur pin implanted   KNEE ARTHROSCOPY Left 2010   debridement left knee scar tissue   LAPAROSCOPIC OOPHERECTOMY     1986, 1990    LASIK  1998   NEUROMA SURGERY Right 2003   Right Foot Neuroma, plus Bunionectomy, Right Great Toe   POLYPECTOMY  02/15/2021   Procedure: POLYPECTOMY;  Surgeon: Wilhelmenia Aloha Raddle., MD;  Location: [facility];  Service: Gastroenterology;;   RECTOCELE REPAIR  1968   REPLACEMENT TOTAL KNEE Left 01/2008   REPLACEMENT TOTAL KNEE Right 08/2008   retinal pucker correction Left 07/14/2012   schwannoma tumor (benign) removal  2011   right rib   SIGMOIDOSCOPY     TONSILLECTOMY AND ADENOIDECTOMY     VENTRAL HERNIA REPAIR  1993   Patient Active Problem List   Diagnosis Date Noted   Precordial chest pain 10/14/2024   Arthralgia of right elbow 08/24/2022   Bilateral wrist pain 08/24/2022   Neck pain 08/24/2022   Pain of right hip joint 08/24/2022   Chronic pain of right knee 05/27/2021   OSA on CPAP 05/27/2021   LLQ pain 12/16/2020   History of diverticulitis 12/16/2020   Hematochezia 12/16/2020   Abnormal colonoscopy 12/16/2020   Acquired trigger finger of right index finger 10/26/2020   Other specified arthritis, right hand 10/26/2020   Diverticulitis of colon 08/15/2020  Dry eyes, bilateral 10/10/2019   Sleep-related hypoxia 08/05/2019   OSA (obstructive sleep apnea) 08/05/2019   PLMD (periodic limb movement disorder) 08/05/2019   Cerebellar 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) 05/06/2019   Excessive daytime sleepiness 05/06/2019   Loud snoring 05/06/2019   Ventral hernia without obstruction or gangrene 02/22/2019   Chronic throat clearing 01/18/2019   Nocturnal cough 01/18/2019   Family history of colon cancer in father 01/18/2019   Diverticulosis 01/18/2019   Gait abnormality 12/27/2018   Numbness 12/27/2018   Status post total bilateral knee replacement 11/30/2018   DDD (degenerative disc disease), cervical 11/30/2018   Status post carpal tunnel release 11/30/2018   DDD (degenerative disc disease), lumbar 10/26/2018   Primary osteoarthritis of both hands  10/26/2018   Primary osteoarthritis of both feet 10/26/2018   Gastroesophageal reflux disease 08/27/2018   Bilateral impacted cerumen 08/21/2018   Sensorineural hearing loss (SNHL) of both ears 08/21/2018   Tinnitus, bilateral 08/21/2018   Blood in urine 04/17/2018   Secondary open-angle glaucoma of left eye, moderate stage 03/10/2018   Early stage 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 10/05/2017   Epiretinal membrane (ERM) of left eye 10/05/2017   Pseudophakia of both eyes 10/05/2017   Osteopenia 03/19/2017   Mixed hyperlipidemia 08/18/2016   Generalized osteoarthritis of multiple sites 08/18/2016   Peripheral neuropathic pain 08/18/2016   Overactive bladder 08/18/2016   Glaucoma 08/18/2016   Cervical disc disorder with radiculopathy of cervical region 08/17/2016   Carpal tunnel syndrome, right upper limb 08/17/2016   Abnormal finding on mammography 06/14/2016   Breast lump 05/06/2015    PCP: Yolande Toribio MATSU, MD  REFERRING PROVIDER:  Yolande Toribio MATSU, MD   REFERRING DIAG:  R26.81 (ICD-10-CM) - Unsteadiness on feet/Unsteady gait   THERAPY DIAG:  Other abnormalities of gait and mobility  Unsteadiness on feet  Muscle weakness (generalized)  Right shoulder pain, unspecified chronicity  Stiffness of right shoulder, not elsewhere classified  Rationale for Evaluation and Treatment: Rehabilitation  ONSET DATE: 2021 ; PT order 04/04/2024  SUBJECTIVE:   SUBJECTIVE STATEMENT: Pt denies any adverse effects after prior treatment.  Pt feels good using her rollator.  Pt walked 9 laps upstairs on the track with rollator.  Pt used the elevator to get to the 2nd floor.       PERTINENT HISTORY: Pt uses a SPC with walking.  Flexor tenotomy on 3 toes of R foot on 05/01/24--Pt reports she has no restrictions from MD. Arthritis, osteopenia, peripheral neuropathy Bilat TKR  PAIN:  R shoulder:  3/10     PRECAUTIONS: Fall and Other: peripheral  neuropathy   WEIGHT BEARING RESTRICTIONS: No  FALLS:  Has patient fallen in last 6 months? No  LIVING ENVIRONMENT: Lives with: lives alone Lives in:  1 story home Stairs: No Has following equipment at home: Single point cane, shower chair, and Grab bars  OCCUPATION:  Retired  PLOF: Independent  PATIENT GOALS: improved ability to pivot.  Improved walking on uneven terrain and performing stairs   OBJECTIVE:  Note: Objective measures were completed at Evaluation unless otherwise noted.  DIAGNOSTIC FINDINGS: Pt reports x rays were negative for fracture.   TREATMENT DATE:   11/20   Pulleys in flexion and scaption Seated elbow flexion/extension AROM  2x10 Standing ladder walks x 5 reps Staggered stance standing 2x30 sec with rail assist as needed  Half tandem 2x30 sec with rail assist as needed Step ups and step downs on 4 inch step x 10 reps  bilat with rail Step ups on 6 inch step x10 reps bilat with rail Step downs on 4 inch step x 5 reps bilat with rail Sit to stands with GTB around LE's 2x5 reps with PT assistance Tandem gait with rail                                                                                                                                 PATIENT EDUCATION:  Education details:  HEP, rationale of interventions, POC, dx, exercise form, and relevant anatomy.  PT answered pt's questions.   Person educated: Patient Education method: Explanation, demonstration, verbal cues Education comprehension: verbalized understanding and needs further education, verbal cues required, returned demonstration  HOME EXERCISE PROGRAM:   Access Code: [email or web address] URL: [email or web address] Date: 09/17/2024 Prepared by: Mose Minerva   ASSESSMENT:  CLINICAL IMPRESSION: Pt enters the clinic with rollator.  Pt has walked around the track upstairs with her rollator.  PT did remind pt of locking brakes of rollator with sit/stand transfer.  Pt required  multiple attempts on a few reps of sit to stands with GTB around LE's.  Pt had a LOB with sit to stand requiring PT assistance to correct.  Pt had improved balance with staggered stance standing today though did use the rail for support at times.  Pt also had improved balance with half tandem stance.  PT worked on step ups and step downs on a low step today with UE support for improved performance of stairs and curbs.  She responded well to treatment and had no c/o's after treatment.    OBJECTIVE IMPAIRMENTS: Abnormal gait, decreased activity tolerance, decreased balance, decreased endurance, decreased mobility, difficulty walking, and decreased strength.   ACTIVITY LIMITATIONS: standing, squatting, stairs, transfers, and locomotion level  PARTICIPATION LIMITATIONS: cleaning and community activity  PERSONAL FACTORS: 3+ comorbidities: Arthritis, bilat TKA, peripheral neuropathy are also affecting patient's functional outcome.   REHAB POTENTIAL: Good  CLINICAL DECISION MAKING: Stable/uncomplicated  EVALUATION COMPLEXITY: Low   GOALS:   SHORT TERM GOALS: Target date:  06/05/2024   Pt will be independent and compliant with HEP for improved strength, balance, stability, and mobility.  Baseline: Goal status: GOAL MET 06/27/24  2.  Pt will demo improved time on TUG test by at least 5 seconds for improved mobility and reduced fall risk.   Baseline:  Goal status: 80% MET  3.  Pt will report at least a 25% improvement in her balance.   Baseline:  Goal status: GOAL MET  06/27/24  4.  Pt will report at least a 25% improvement in daily usage of R UE without adverse effects.  Goal Status:  INITIAL Target date: 10/09/2024  5.  Pt will demo at least a 12-15 deg increase in R shoulder flexion AROM and a 5-7 deg increase in R elbow extension AROM for improved performance of reaching and performance of daily activities.  Goal Status:  INITIAL Target date: 10/16/2024  LONG TERM GOALS:  Target date:  11/14/2024   Pt will score at least 44/56 on the Berg Balance Assessment for improved balance and stability with daily tasks and mobility.  Baseline:  35/56 initial,  40/56 on 11/6 Goal status: NOT MET  2.  Pt will be able to load and unload her dishwasher with good stability and good confidence.  Baseline:  Goal status: GOAL MET  with holding onto counter  9/4  3.  Pt will demo improved bilat LE strength to 4+/5 in hip flexion, 5/5 in knee extension, 4-/5 in R ankle DF, and a 5# increase in L hip abduction for improved performance of functional mobility and performance of household chores.  Baseline:  Goal status:  ONGOING  4.  Pt will be able to ascend and descend stairs with a reciprocal gait with the rail.   Baseline:  Goal status: PROGRESSING  9/4  5.  Pt will report she is able to perform her normal ambulation with cane with good stability and confidence and without LOB. Baseline:  Goal status:ONGOING  6.  Pt will report she is able to perform her normal reaching activities including reaching into an overhead cabinet without significant difficulty and pain.   Goal status:  INITIAL  7.  Pt will be able to dress without difficulty.   Goal status:  INITIAL  8.  Pt will be able to perform her ADLs without significant shoulder pain and difficulty.   Goal status:  INITIAL  9.  Pt will report at least a 50% improvement in balance with turning her head and also closing heavy doors.   Goal status:  INITIAL   PLAN:  PT FREQUENCY: 2x/wk  PT DURATION: 5 weeks  PLANNED INTERVENTIONS: 97164- PT Re-evaluation, 97750- Physical Performance Testing, 97110-Therapeutic exercises, 97530- Therapeutic activity, [id number]- Neuromuscular re-education, 97535- Self Care, [id number]- Manual therapy, [phone]- Gait training, [phone]- Aquatic Therapy, [phone]- Electrical stimulation (unattended), Patient/Family education, Balance training, Stair training, Taping, DME instructions, Cryotherapy, and  Moist heat  PLAN FOR NEXT SESSION: Work on [employer].  LE strengthening and balance activities.  Work on step ups and step downs.  Cont with shoulder ROM.  Perform pulleys   Leigh Minerva III PT, DPT 10/29/24 1:23 PM                                        OUTPATIENT PHYSICAL THERAPY SHOULDER TREATMENT       Patient Name: Hailey Perkins MRN: [id number] DOB:October 14, 1935, 89 y.o., female Today's Date: 10/29/2024  END OF SESSION:  PT End of Session - 10/29/24 1322     Visit Number 29    Number of Visits 36    Date for Recertification  11/14/24    Authorization Type BCBS MCR    PT Start Time 1320                      Past Medical History:  Diagnosis Date   Aortic atherosclerosis    Arthritis    Cataract 2010   bilateral eyes   Chicken pox    Cholelithiasis    Diverticulitis    Diverticulosis    GERD (gastroesophageal reflux disease)    Glaucoma    Hiatal hernia    History of frequent urinary tract infections    Hyperlipidemia    Osteopenia    Peripheral neuropathy    Sleep apnea  wears a CPAP   Urine incontinence    Past Surgical History:  Procedure Laterality Date   ABDOMINAL HYSTERECTOMY  1977   ANAL FISTULECTOMY  1985   ANKLE FRACTURE SURGERY Right 2014   APPENDECTOMY     BIOPSY  02/15/2021   Procedure: BIOPSY;  Surgeon: Wilhelmenia Aloha Raddle., MD;  Location: [facility];  Service: Gastroenterology;;   BREAST BIOPSY Bilateral 10+ yrs ago   BENIGN   BUNIONECTOMY Left 1975   Left Toe   CATARACT EXTRACTION, BILATERAL  2009   COLONOSCOPY WITH PROPOFOL  N/A 02/15/2021   Procedure: COLONOSCOPY WITH PROPOFOL ;  Surgeon: Wilhelmenia Aloha Raddle., MD;  Location: [facility];  Service: Gastroenterology;  Laterality: N/A;  ultraslimscope    DILATION AND CURETTAGE OF UTERUS  1972   eye PRK vision correction Left 10/09/2013   eye socket re-formed Left 2014   FOOT NEUROMA SURGERY Right 2003   plus bunionectomy, right  great toe   HAMMER TOE SURGERY Right 2005   Right foot correction hammertoe little toe   HEMORRHOID SURGERY  1967   HIP SURGERY Right 2014   right hip femur pin implanted   KNEE ARTHROSCOPY Left 2010   debridement left knee scar tissue   LAPAROSCOPIC OOPHERECTOMY     1986, 1990   LASIK  1998   NEUROMA SURGERY Right 2003   Right Foot Neuroma, plus Bunionectomy, Right Great Toe   POLYPECTOMY  02/15/2021   Procedure: POLYPECTOMY;  Surgeon: Wilhelmenia Aloha Raddle., MD;  Location: [facility];  Service: Gastroenterology;;   RECTOCELE REPAIR  1968   REPLACEMENT TOTAL KNEE Left 01/2008   REPLACEMENT TOTAL KNEE Right 08/2008   retinal pucker correction Left 07/14/2012   schwannoma tumor (benign) removal  2011   right rib   SIGMOIDOSCOPY     TONSILLECTOMY AND ADENOIDECTOMY     VENTRAL HERNIA REPAIR  1993   Patient Active Problem List   Diagnosis Date Noted   Precordial chest pain 10/14/2024   Arthralgia of right elbow 08/24/2022   Bilateral wrist pain 08/24/2022   Neck pain 08/24/2022   Pain of right hip joint 08/24/2022   Chronic pain of right knee 05/27/2021   OSA on CPAP 05/27/2021   LLQ pain 12/16/2020   History of diverticulitis 12/16/2020   Hematochezia 12/16/2020   Abnormal colonoscopy 12/16/2020   Acquired trigger finger of right index finger 10/26/2020   Other specified arthritis, right hand 10/26/2020   Diverticulitis of colon 08/15/2020   Dry eyes, bilateral 10/10/2019   Sleep-related hypoxia 08/05/2019   OSA (obstructive sleep apnea) 08/05/2019   PLMD (periodic limb movement disorder) 08/05/2019   Cerebellar 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) 05/06/2019   Excessive daytime sleepiness 05/06/2019   Loud snoring 05/06/2019   Ventral hernia without obstruction or gangrene 02/22/2019   Chronic throat clearing 01/18/2019   Nocturnal cough 01/18/2019   Family history of colon cancer in father 01/18/2019   Diverticulosis 01/18/2019   Gait abnormality  12/27/2018   Numbness 12/27/2018   Status post total bilateral knee replacement 11/30/2018   DDD (degenerative disc disease), cervical 11/30/2018   Status post carpal tunnel release 11/30/2018   DDD (degenerative disc disease), lumbar 10/26/2018   Primary osteoarthritis of both hands 10/26/2018   Primary osteoarthritis of both feet 10/26/2018   Gastroesophageal reflux disease 08/27/2018   Bilateral impacted cerumen 08/21/2018   Sensorineural hearing loss (SNHL) of both ears 08/21/2018   Tinnitus, bilateral 08/21/2018   Blood in urine 04/17/2018   Secondary open-angle glaucoma of left  eye, moderate stage 03/10/2018   Early stage 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 10/05/2017   Epiretinal membrane (ERM) of left eye 10/05/2017   Pseudophakia of both eyes 10/05/2017   Osteopenia 03/19/2017   Mixed hyperlipidemia 08/18/2016   Generalized osteoarthritis of multiple sites 08/18/2016   Peripheral neuropathic pain 08/18/2016   Overactive bladder 08/18/2016   Glaucoma 08/18/2016   Cervical disc disorder with radiculopathy of cervical region 08/17/2016   Carpal tunnel syndrome, right upper limb 08/17/2016   Abnormal finding on mammography 06/14/2016   Breast lump 05/06/2015    PCP: Yolande Toribio MATSU, MD  REFERRING PROVIDER:  Yolande Toribio MATSU, MD   REFERRING DIAG:  R26.81 (ICD-10-CM) - Unsteadiness on feet/Unsteady gait   THERAPY DIAG:  Other abnormalities of gait and mobility  Unsteadiness on feet  Muscle weakness (generalized)  Right shoulder pain, unspecified chronicity  Stiffness of right shoulder, not elsewhere classified  Rationale for Evaluation and Treatment: Rehabilitation  ONSET DATE: 2021 ; PT order 04/04/2024  SUBJECTIVE:   SUBJECTIVE STATEMENT: Pt had a massage yesterday and had increased shoulder pain in the evening also when waking up this AM.  Pt denies any pain in R shoulder or LE's currently.  Pt denies any adverse effects after prior  treatment.  Pt feels good using her rollator.      PERTINENT HISTORY: Pt uses a SPC with walking.  Flexor tenotomy on 3 toes of R foot on 05/01/24--Pt reports she has no restrictions from MD. Arthritis, osteopenia, peripheral neuropathy Bilat TKR  PAIN:  R shoulder:  0/10     PRECAUTIONS: Fall and Other: peripheral neuropathy   WEIGHT BEARING RESTRICTIONS: No  FALLS:  Has patient fallen in last 6 months? No  LIVING ENVIRONMENT: Lives with: lives alone Lives in:  1 story home Stairs: No Has following equipment at home: Single point cane, shower chair, and Grab bars  OCCUPATION:  Retired  PLOF: Independent  PATIENT GOALS: improved ability to pivot.  Improved walking on uneven terrain and performing stairs   OBJECTIVE:  Note: Objective measures were completed at Evaluation unless otherwise noted.  DIAGNOSTIC FINDINGS: Pt reports x rays were negative for fracture.   TREATMENT DATE:   11/25  Pulleys in flexion and scaption 2x15 each Standing ladder walks x 6 reps, [place] walks x 10 Sit to stands with GTB around LE's 2x5 reps Staggered stance standing 2x30 sec with rail assist as needed  Half tandem 2x30 sec with rail assist as needed Step ups and step downs on 4 inch step 2x10 reps bilat with rail Step ups on 6 inch step x10 reps bilat with rail  Sidestepping on airex beam with UE support on rail Tandem gait with rail Alt LE taps on hurdle with UE on rail with CGA and min assist on 1-2 occasions  PATIENT EDUCATION:  Education details:  HEP, rationale of interventions, POC, dx, exercise form, and relevant anatomy.  PT answered pt's questions.   Person educated: Patient Education method: Explanation, demonstration, verbal cues Education comprehension: verbalized understanding and needs further education, verbal cues required, returned  demonstration  HOME EXERCISE PROGRAM:   Access Code: [email or web address] URL: [email or web address] Date: 09/17/2024 Prepared by: Mose Minerva   ASSESSMENT:  CLINICAL IMPRESSION: Pt enters the clinic with rollator.  Pt has walked around the track upstairs with her rollator.  PT did remind pt of locking brakes of rollator with sit/stand transfer.  Pt required multiple attempts on a few reps of sit to stands with GTB around LE's.  Pt had a LOB with sit to stand requiring PT assistance to correct.  Pt had improved balance with staggered stance standing today though did use the rail for support at times.  Pt also had improved balance with half tandem stance.  PT worked on step ups and step downs on a low step today with UE support for improved performance of stairs and curbs.  She responded well to treatment and had no c/o's after treatment.   Easier with R LE with step ups Alt LE taps on hurdle with UE on rail with CGA and min assist on 1-2 occasions  PT instructed pt to decrease UE support on rail   OBJECTIVE IMPAIRMENTS: Abnormal gait, decreased activity tolerance, decreased balance, decreased endurance, decreased mobility, difficulty walking, and decreased strength.   ACTIVITY LIMITATIONS: standing, squatting, stairs, transfers, and locomotion level  PARTICIPATION LIMITATIONS: cleaning and community activity  PERSONAL FACTORS: 3+ comorbidities: Arthritis, bilat TKA, peripheral neuropathy are also affecting patient's functional outcome.   REHAB POTENTIAL: Good  CLINICAL DECISION MAKING: Stable/uncomplicated  EVALUATION COMPLEXITY: Low   GOALS:   SHORT TERM GOALS: Target date:  06/05/2024   Pt will be independent and compliant with HEP for improved strength, balance, stability, and mobility.  Baseline: Goal status: GOAL MET 06/27/24  2.  Pt will demo improved time on TUG test by at least 5 seconds for improved mobility and reduced fall risk.   Baseline:  Goal status: 80%  MET  3.  Pt will report at least a 25% improvement in her balance.   Baseline:  Goal status: GOAL MET  06/27/24  4.  Pt will report at least a 25% improvement in daily usage of R UE without adverse effects.  Goal Status:  INITIAL Target date: 10/09/2024  5.  Pt will demo at least a 12-15 deg increase in R shoulder flexion AROM and a 5-7 deg increase in R elbow extension AROM for improved performance of reaching and performance of daily activities.  Goal Status:  INITIAL Target date: 10/16/2024      LONG TERM GOALS: Target date:  11/14/2024   Pt will score at least 44/56 on the Berg Balance Assessment for improved balance and stability with daily tasks and mobility.  Baseline:  35/56 initial,  40/56 on 11/6 Goal status: NOT MET  2.  Pt will be able to load and unload her dishwasher with good stability and good confidence.  Baseline:  Goal status: GOAL MET  with holding onto counter  9/4  3.  Pt will demo improved bilat LE strength to 4+/5 in hip flexion, 5/5 in knee extension, 4-/5 in R ankle DF, and a 5# increase in L hip abduction for improved performance of functional mobility and performance of household chores.  Baseline:  Goal status:  ONGOING  4.  Pt  will be able to ascend and descend stairs with a reciprocal gait with the rail.   Baseline:  Goal status: PROGRESSING  9/4  5.  Pt will report she is able to perform her normal ambulation with cane with good stability and confidence and without LOB. Baseline:  Goal status:ONGOING  6.  Pt will report she is able to perform her normal reaching activities including reaching into an overhead cabinet without significant difficulty and pain.   Goal status:  INITIAL  7.  Pt will be able to dress without difficulty.   Goal status:  INITIAL  8.  Pt will be able to perform her ADLs without significant shoulder pain and difficulty.   Goal status:  INITIAL  9.  Pt will report at least a 50% improvement in balance with turning her  head and also closing heavy doors.   Goal status:  INITIAL   PLAN:  PT FREQUENCY: 2x/wk  PT DURATION: 5 weeks  PLANNED INTERVENTIONS: 97164- PT Re-evaluation, 97750- Physical Performance Testing, 97110-Therapeutic exercises, 97530- Therapeutic activity, [id number]- Neuromuscular re-education, 97535- Self Care, 02859- Manual therapy, [phone]- Gait training, [phone]- Aquatic Therapy, [phone]- Electrical stimulation (unattended), Patient/Family education, Balance training, Stair training, Taping, DME instructions, Cryotherapy, and Moist heat  PLAN FOR NEXT SESSION: Work on [employer].  LE strengthening and balance activities.  Work on step ups and step downs.  Cont with shoulder ROM.  Perform pulleys   Leigh Minerva III PT, DPT 10/29/24 1:23 PM

## 2024-10-30 NOTE — Progress Notes (Signed)
 Subjective:   Patient ID: Hailey Perkins, female   DOB: 89 y.o.   MRN: [id number]   HPI Chief Complaint  Patient presents with   Ingrown Toenail    Rm12 Patient complains of pain right halllux medial border/ hurts with pressure   89 year old female presents the office with concerns of ingrown toenail to the right big toenail.  She did hurt with pressure.  This been a chronic issue.  She does not report any current drainage or purulence.  No recent injuries.   Review of Systems  All other systems reviewed and are negative.  Past Medical History:  Diagnosis Date   Aortic atherosclerosis    Arthritis    Cataract 2010   bilateral eyes   Chicken pox    Cholelithiasis    Diverticulitis    Diverticulosis    GERD (gastroesophageal reflux disease)    Glaucoma    Hiatal hernia    History of frequent urinary tract infections    Hyperlipidemia    Osteopenia    Peripheral neuropathy    Sleep apnea    wears a CPAP   Urine incontinence     Past Surgical History:  Procedure Laterality Date   ABDOMINAL HYSTERECTOMY  1977   ANAL FISTULECTOMY  1985   ANKLE FRACTURE SURGERY Right 2014   APPENDECTOMY     BIOPSY  02/15/2021   Procedure: BIOPSY;  Surgeon: Wilhelmenia Aloha Raddle., MD;  Location: [facility];  Service: Gastroenterology;;   BREAST BIOPSY Bilateral 10+ yrs ago   BENIGN   BUNIONECTOMY Left 1975   Left Toe   CATARACT EXTRACTION, BILATERAL  2009   COLONOSCOPY WITH PROPOFOL  N/A 02/15/2021   Procedure: COLONOSCOPY WITH PROPOFOL ;  Surgeon: Wilhelmenia Aloha Raddle., MD;  Location: [facility];  Service: Gastroenterology;  Laterality: N/A;  ultraslimscope    DILATION AND CURETTAGE OF UTERUS  1972   eye PRK vision correction Left 10/09/2013   eye socket re-formed Left 2014   FOOT NEUROMA SURGERY Right 2003   plus bunionectomy, right great toe   HAMMER TOE SURGERY Right 2005   Right foot correction hammertoe little toe   HEMORRHOID SURGERY  1967   HIP SURGERY Right 2014    right hip femur pin implanted   KNEE ARTHROSCOPY Left 2010   debridement left knee scar tissue   LAPAROSCOPIC OOPHERECTOMY     1986, 1990   LASIK  1998   NEUROMA SURGERY Right 2003   Right Foot Neuroma, plus Bunionectomy, Right Great Toe   POLYPECTOMY  02/15/2021   Procedure: POLYPECTOMY;  Surgeon: Mansouraty, Aloha Raddle., MD;  Location: [facility];  Service: Gastroenterology;;   RECTOCELE REPAIR  1968   REPLACEMENT TOTAL KNEE Left 01/2008   REPLACEMENT TOTAL KNEE Right 08/2008   retinal pucker correction Left 07/14/2012   schwannoma tumor (benign) removal  2011   right rib   SIGMOIDOSCOPY     TONSILLECTOMY AND ADENOIDECTOMY     VENTRAL HERNIA REPAIR  1993     Current Outpatient Medications:    Alpha-Lipoic Acid 300 MG TABS, Take 300 mg by mouth daily., Disp: , Rfl:    amoxicillin  (AMOXIL ) 500 MG capsule, Take 4 capsules by mouth 1 hour prior to appointment., Disp: 12 capsule, Rfl: 2   Ascorbic Acid (VITAMIN C) 1000 MG tablet, Take 1,000 mg by mouth daily., Disp: , Rfl:    betamethasone  dipropionate 0.05 % lotion, Apply 1 application topically daily as needed (scalp irritation)., Disp: , Rfl: 0   Biotin 10 MG TABS,  Take 10 mg by mouth daily., Disp: , Rfl:    Calcium Citrate-Vitamin D  (CALCIUM + D PO), Take 2 tablets by mouth daily., Disp: , Rfl:    celecoxib  (CELEBREX ) 200 MG capsule, Take 200 mg by mouth daily., Disp: , Rfl:    Cholecalciferol (VITAMIN D3) 2000 units TABS, Take 2,000 Units by mouth daily., Disp: , Rfl:    diclofenac Sodium (VOLTAREN) 1 % GEL, Apply 1 application topically 3 (three) times daily as needed (pain)., Disp: , Rfl:    dorzolamide (TRUSOPT) 2 % ophthalmic solution, Place 1 drop into both eyes 2 (two) times daily., Disp: , Rfl:    fluticasone (CUTIVATE) 0.05 % cream, Apply topically., Disp: , Rfl:    GLUCOSAMINE-CHONDROITIN-MSM PO, Take 2 capsules by mouth daily. 1500 mg Glucosamine, 750 mg MSM, Disp: , Rfl:    latanoprost (XALATAN) 0.005 % ophthalmic  solution, SMARTSIG:In Eye(s), Disp: , Rfl:    levothyroxine (SYNTHROID) 25 MCG tablet, Take 25 mcg by mouth daily before breakfast., Disp: , Rfl:    Magnesium  250 MG TABS, Take 250 mg by mouth daily., Disp: , Rfl:    Multiple Vitamins-Minerals (PRESERVISION AREDS PO), Take 1 capsule by mouth in the morning and at bedtime., Disp: , Rfl:    Omega-3 Fatty Acids (FISH OIL) 1000 MG CAPS, Take 1,000 mg by mouth daily., Disp: , Rfl:    pantoprazole  (PROTONIX ) 40 MG tablet, Take 40 mg by mouth daily., Disp: , Rfl:    Plant Sterols and Stanols (CHOLESTOFF PLUS PO), Take 2 capsules by mouth in the morning and at bedtime., Disp: , Rfl:    polyethylene glycol powder (GLYCOLAX/MIRALAX) 17 GM/SCOOP powder, Take 17 g by mouth daily as needed for moderate constipation., Disp: , Rfl:    timolol (TIMOPTIC) 0.5 % ophthalmic solution, SMARTSIG:In Eye(s), Disp: , Rfl:    TURMERIC CURCUMIN PO, Take 2 capsules by mouth daily., Disp: , Rfl:    vitamin B-12 (CYANOCOBALAMIN) 1000 MCG tablet, Take 1,000 mcg by mouth daily., Disp: , Rfl:    DULoxetine  (CYMBALTA ) 30 MG capsule, TAKE 1 CAPSULE(30 MG) BY MOUTH DAILY (Patient not taking: Reported on 10/28/2024), Disp: 90 capsule, Rfl: 2  Allergies  Allergen Reactions   Tape Other (See Comments)    Adhesive Tape; pt stated itch and redness Adhesive Tape; pt stated itch and redness          Objective:  Physical Exam  General: AAO x3, NAD  Dermatological: Incurvation present hallux toenail with tenderness along the distal portion of nail corner.  There is no erythema, drainage or pus or any signs of infection.  There are no open lesions identified today.  Vascular: Dorsalis Pedis artery and Posterior Tibial artery pedal pulses are 2/4 bilateral with immedate capillary fill time. There is no pain with calf compression, swelling, warmth, erythema.   Neruologic: Grossly intact via light touch bilateral.   Musculoskeletal: No other areas of discomfort.        Assessment:   Chronic ingrown toenail     Plan:  -Treatment options discussed including all alternatives, risks, and complications -Etiology of symptoms were discussed - We discussed partial nail avulsion with chemical matricectomy.  She will consider this in the future.  For now given the comorbid and complications her body.  Discussed avoiding shoes that cause pressure on the toe.  Monitor for any signs or symptoms of infection.  Return if symptoms worsen or fail to improve.  Donnice JONELLE Fees DPM

## 2024-11-04 ENCOUNTER — Encounter (HOSPITAL_BASED_OUTPATIENT_CLINIC_OR_DEPARTMENT_OTHER): Payer: Self-pay | Admitting: Physical Therapy

## 2024-11-04 ENCOUNTER — Ambulatory Visit (HOSPITAL_BASED_OUTPATIENT_CLINIC_OR_DEPARTMENT_OTHER): Attending: Internal Medicine | Admitting: Physical Therapy

## 2024-11-04 DIAGNOSIS — M25611 Stiffness of right shoulder, not elsewhere classified: Secondary | ICD-10-CM | POA: Insufficient documentation

## 2024-11-04 DIAGNOSIS — R2681 Unsteadiness on feet: Secondary | ICD-10-CM | POA: Diagnosis present

## 2024-11-04 DIAGNOSIS — M25511 Pain in right shoulder: Secondary | ICD-10-CM | POA: Diagnosis present

## 2024-11-04 DIAGNOSIS — R2689 Other abnormalities of gait and mobility: Secondary | ICD-10-CM | POA: Diagnosis present

## 2024-11-04 DIAGNOSIS — M6281 Muscle weakness (generalized): Secondary | ICD-10-CM | POA: Diagnosis present

## 2024-11-04 NOTE — Therapy (Signed)
 OUTPATIENT PHYSICAL THERAPY SHOULDER TREATMENT       Patient Name: Hailey Perkins MRN: [id number] DOB:1935/04/24, 89 y.o., female Today's Date: 11/05/2024  END OF SESSION:  PT End of Session - 11/04/24 1328     Visit Number 30    Number of Visits 36    Date for Recertification  11/14/24    Authorization Type BCBS MCR    PT Start Time 1319    PT Stop Time 1403    PT Time Calculation (min) 44 min    Activity Tolerance Patient tolerated treatment well    Behavior During Therapy WFL for tasks assessed/performed                      Past Medical History:  Diagnosis Date   Aortic atherosclerosis    Arthritis    Cataract 2010   bilateral eyes   Chicken pox    Cholelithiasis    Diverticulitis    Diverticulosis    GERD (gastroesophageal reflux disease)    Glaucoma    Hiatal hernia    History of frequent urinary tract infections    Hyperlipidemia    Osteopenia    Peripheral neuropathy    Sleep apnea    wears a CPAP   Urine incontinence    Past Surgical History:  Procedure Laterality Date   ABDOMINAL HYSTERECTOMY  1977   ANAL FISTULECTOMY  1985   ANKLE FRACTURE SURGERY Right 2014   APPENDECTOMY     BIOPSY  02/15/2021   Procedure: BIOPSY;  Surgeon: Wilhelmenia Aloha Raddle., MD;  Location: [facility];  Service: Gastroenterology;;   BREAST BIOPSY Bilateral 10+ yrs ago   BENIGN   BUNIONECTOMY Left 1975   Left Toe   CATARACT EXTRACTION, BILATERAL  2009   COLONOSCOPY WITH PROPOFOL  N/A 02/15/2021   Procedure: COLONOSCOPY WITH PROPOFOL ;  Surgeon: Wilhelmenia Aloha Raddle., MD;  Location: [facility];  Service: Gastroenterology;  Laterality: N/A;  ultraslimscope    DILATION AND CURETTAGE OF UTERUS  1972   eye PRK vision correction Left 10/09/2013   eye socket re-formed Left 2014   FOOT NEUROMA SURGERY Right 2003   plus bunionectomy, right great toe   HAMMER TOE SURGERY Right 2005   Right foot correction hammertoe little toe   HEMORRHOID  SURGERY  1967   HIP SURGERY Right 2014   right hip femur pin implanted   KNEE ARTHROSCOPY Left 2010   debridement left knee scar tissue   LAPAROSCOPIC OOPHERECTOMY     1986, 1990   LASIK  1998   NEUROMA SURGERY Right 2003   Right Foot Neuroma, plus Bunionectomy, Right Great Toe   POLYPECTOMY  02/15/2021   Procedure: POLYPECTOMY;  Surgeon: Wilhelmenia Aloha Raddle., MD;  Location: [facility];  Service: Gastroenterology;;   RECTOCELE REPAIR  1968   REPLACEMENT TOTAL KNEE Left 01/2008   REPLACEMENT TOTAL KNEE Right 08/2008   retinal pucker correction Left 07/14/2012   schwannoma tumor (benign) removal  2011   right rib   SIGMOIDOSCOPY     TONSILLECTOMY AND ADENOIDECTOMY     VENTRAL HERNIA REPAIR  1993   Patient Active Problem List   Diagnosis Date Noted   Precordial chest pain 10/14/2024   Arthralgia of right elbow 08/24/2022   Bilateral wrist pain 08/24/2022   Neck pain 08/24/2022   Pain of right hip joint 08/24/2022   Chronic pain of right knee 05/27/2021   OSA on CPAP 05/27/2021   LLQ pain 12/16/2020  History of diverticulitis 12/16/2020   Hematochezia 12/16/2020   Abnormal colonoscopy 12/16/2020   Acquired trigger finger of right index finger 10/26/2020   Other specified arthritis, right hand 10/26/2020   Diverticulitis of colon 08/15/2020   Dry eyes, bilateral 10/10/2019   Sleep-related hypoxia 08/05/2019   OSA (obstructive sleep apnea) 08/05/2019   PLMD (periodic limb movement disorder) 08/05/2019   Cerebellar 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) 05/06/2019   Excessive daytime sleepiness 05/06/2019   Loud snoring 05/06/2019   Ventral hernia without obstruction or gangrene 02/22/2019   Chronic throat clearing 01/18/2019   Nocturnal cough 01/18/2019   Family history of colon cancer in father 01/18/2019   Diverticulosis 01/18/2019   Gait abnormality 12/27/2018   Numbness 12/27/2018   Status post total bilateral knee replacement 11/30/2018   DDD  (degenerative disc disease), cervical 11/30/2018   Status post carpal tunnel release 11/30/2018   DDD (degenerative disc disease), lumbar 10/26/2018   Primary osteoarthritis of both hands 10/26/2018   Primary osteoarthritis of both feet 10/26/2018   Gastroesophageal reflux disease 08/27/2018   Bilateral impacted cerumen 08/21/2018   Sensorineural hearing loss (SNHL) of both ears 08/21/2018   Tinnitus, bilateral 08/21/2018   Blood in urine 04/17/2018   Secondary open-angle glaucoma of left eye, moderate stage 03/10/2018   Early stage 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 10/05/2017   Epiretinal membrane (ERM) of left eye 10/05/2017   Pseudophakia of both eyes 10/05/2017   Osteopenia 03/19/2017   Mixed hyperlipidemia 08/18/2016   Generalized osteoarthritis of multiple sites 08/18/2016   Peripheral neuropathic pain 08/18/2016   Overactive bladder 08/18/2016   Glaucoma 08/18/2016   Cervical disc disorder with radiculopathy of cervical region 08/17/2016   Carpal tunnel syndrome, right upper limb 08/17/2016   Abnormal finding on mammography 06/14/2016   Breast lump 05/06/2015    PCP: Yolande Toribio MATSU, MD  REFERRING PROVIDER:  Yolande Toribio MATSU, MD   REFERRING DIAG:  R26.81 (ICD-10-CM) - Unsteadiness on feet/Unsteady gait   THERAPY DIAG:  Other abnormalities of gait and mobility  Unsteadiness on feet  Muscle weakness (generalized)  Right shoulder pain, unspecified chronicity  Stiffness of right shoulder, not elsewhere classified  Rationale for Evaluation and Treatment: Rehabilitation  ONSET DATE: 2021 ; PT order 04/04/2024  SUBJECTIVE:   SUBJECTIVE STATEMENT: Pt reports her shoulder was sore when she woke up this AM.  Pt denies any adverse effects after prior treatment.  Some days are better than others (in reference to her shoulder).  Pt reports no recent improvement in shoulder, though a 50-60% improvement overall.  Pt has pain in bilat UE's when  lying on her side.  Pt states she is not performing her shoulder exercises as much as she should and her leg exercises almost as much as she should.  Pt denies any pain in R shoulder or LE's currently.  Pt denies any adverse effects after prior treatment.      PERTINENT HISTORY: Pt uses a SPC with walking.  Flexor tenotomy on 3 toes of R foot on 05/01/24--Pt reports she has no restrictions from MD. Arthritis, osteopenia, peripheral neuropathy Bilat TKR  PAIN:  R shoulder:  3-4/10     PRECAUTIONS: Fall and Other: peripheral neuropathy   WEIGHT BEARING RESTRICTIONS: No  FALLS:  Has patient fallen in last 6 months? No  LIVING ENVIRONMENT: Lives with: lives alone Lives in:  1 story home Stairs: No Has following equipment at home: Single point cane, shower chair, and Grab bars  OCCUPATION:  Retired  PLOF: Independent  PATIENT GOALS: improved ability to pivot.  Improved walking on uneven terrain and performing stairs   OBJECTIVE:  Note: Objective measures were completed at Evaluation unless otherwise noted.  DIAGNOSTIC FINDINGS: Pt reports x rays were negative for fracture.   TREATMENT DATE:   11/04/24  R elbow extension AROM:  3 deg R shoulder flexion AROM:  147 deg L shoulder flexion AROM:  150 deg   Pulleys in flexion and scaption x20 each in flex and scaption Standing Floyd walks x 10 Standing rows with RTB x 20  Supine serratus punch 2x10 Supine shoulder ABC x 1 rep Sit to stands with GTB around LE's 3x5 reps with SBA to min assist Step ups and step downs on 4 inch step and 6 inch step x10 reps each bilat with rail Tandem gait with rail                                                                                                                                 PATIENT EDUCATION:  Education details:  HEP, rationale of interventions, POC, dx, exercise form, and relevant anatomy.  PT answered pt's questions.   Person educated: Patient Education method:  Explanation, demonstration, verbal cues Education comprehension: verbalized understanding and needs further education, verbal cues required, returned demonstration  HOME EXERCISE PROGRAM:   Access Code: [email or web address] URL: [email or web address] Date: 09/17/2024 Prepared by: Mose Minerva   ASSESSMENT:  CLINICAL IMPRESSION:   Pt reports no recent improvement in shoulder, though a 50-60% improvement in shoulder overall.  PT assessed elbow and shoulder ROM.  Pt demonstrates improved R elbow extension and shoulder flexion AROM by 9 deg and 33 deg respectively.  Pt is challenged with sit to stands with T-band around LE's.  It required pt multiple attempts with certain reps of sit to stands and she also required min assist a couple of times to correct LOB.  PT increased height of step downs today.  Pt met STG's #4,5.  She responded well to treatment reporting no c/o's and having mild fatige after treatment.  Pt should benefit from cont skilled PT to address impairments and ongoing goals and to improve overall function.    OBJECTIVE IMPAIRMENTS: Abnormal gait, decreased activity tolerance, decreased balance, decreased endurance, decreased mobility, difficulty walking, and decreased strength.   ACTIVITY LIMITATIONS: standing, squatting, stairs, transfers, and locomotion level  PARTICIPATION LIMITATIONS: cleaning and community activity  PERSONAL FACTORS: 3+ comorbidities: Arthritis, bilat TKA, peripheral neuropathy are also affecting patient's functional outcome.   REHAB POTENTIAL: Good  CLINICAL DECISION MAKING: Stable/uncomplicated  EVALUATION COMPLEXITY: Low   GOALS:   SHORT TERM GOALS: Target date:  06/05/2024   Pt will be independent and compliant with HEP for improved strength, balance, stability, and mobility.  Baseline: Goal status: GOAL MET 06/27/24  2.  Pt will demo improved time on TUG test by at least 5 seconds for improved mobility and reduced fall risk.  Baseline:  Goal status: 80% MET  3.  Pt will report at least a 25% improvement in her balance.   Baseline:  Goal status: GOAL MET  06/27/24  4.  Pt will report at least a 25% improvement in daily usage of R UE without adverse effects.  Goal Status:  GOAL MET  11/04/24 Target date: 10/09/2024  5.  Pt will demo at least a 12-15 deg increase in R shoulder flexion AROM and a 5-7 deg increase in R elbow extension AROM for improved performance of reaching and performance of daily activities.  Goal Status:  GOAL MET  11/04/24 Target date: 10/16/2024      LONG TERM GOALS: Target date:  11/14/2024   Pt will score at least 44/56 on the Berg Balance Assessment for improved balance and stability with daily tasks and mobility.  Baseline:  35/56 initial,  40/56 on 11/6 Goal status: NOT MET  2.  Pt will be able to load and unload her dishwasher with good stability and good confidence.  Baseline:  Goal status: GOAL MET  with holding onto counter  9/4  3.  Pt will demo improved bilat LE strength to 4+/5 in hip flexion, 5/5 in knee extension, 4-/5 in R ankle DF, and a 5# increase in L hip abduction for improved performance of functional mobility and performance of household chores.  Baseline:  Goal status:  ONGOING  4.  Pt will be able to ascend and descend stairs with a reciprocal gait with the rail.   Baseline:  Goal status: PROGRESSING  9/4  5.  Pt will report she is able to perform her normal ambulation with cane with good stability and confidence and without LOB. Baseline:  Goal status:ONGOING  6.  Pt will report she is able to perform her normal reaching activities including reaching into an overhead cabinet without significant difficulty and pain.   Goal status:  INITIAL  7.  Pt will be able to dress without difficulty.   Goal status:  INITIAL  8.  Pt will be able to perform her ADLs without significant shoulder pain and difficulty.   Goal status:  INITIAL  9.  Pt will report at  least a 50% improvement in balance with turning her head and also closing heavy doors.   Goal status:  INITIAL   PLAN:  PT FREQUENCY: 2x/wk  PT DURATION: 5 weeks  PLANNED INTERVENTIONS: [id number]- PT Re-evaluation, 97750- Physical Performance Testing, 97110-Therapeutic exercises, 97530- Therapeutic activity, [id number]- Neuromuscular re-education, 97535- Self Care, [id number]- Manual therapy, [phone]- Gait training, [phone]- Aquatic Therapy, [phone]- Electrical stimulation (unattended), Patient/Family education, Balance training, Stair training, Taping, DME instructions, Cryotherapy, and Moist heat  PLAN FOR NEXT SESSION: Work on [employer].  LE strengthening and balance activities.  Work on step ups and step downs.  Cont with shoulder ROM.  Perform pulleys   Leigh Minerva III PT, DPT 11/05/24 3:17 PM

## 2024-11-05 NOTE — Telephone Encounter (Signed)
 CPAP [employer] medicare shara: [id number] (exp. 10/09/24 to 01/06/25)   Spoke with the patient.   She is scheduled at [facility] for 12/10/24 at 9 pm. Mailed packet and sent mychart

## 2024-11-06 ENCOUNTER — Ambulatory Visit (HOSPITAL_BASED_OUTPATIENT_CLINIC_OR_DEPARTMENT_OTHER): Admitting: Physical Therapy

## 2024-11-06 DIAGNOSIS — R2681 Unsteadiness on feet: Secondary | ICD-10-CM

## 2024-11-06 DIAGNOSIS — M25511 Pain in right shoulder: Secondary | ICD-10-CM

## 2024-11-06 DIAGNOSIS — M25611 Stiffness of right shoulder, not elsewhere classified: Secondary | ICD-10-CM

## 2024-11-06 DIAGNOSIS — R2689 Other abnormalities of gait and mobility: Secondary | ICD-10-CM | POA: Diagnosis not present

## 2024-11-06 DIAGNOSIS — M6281 Muscle weakness (generalized): Secondary | ICD-10-CM

## 2024-11-06 NOTE — Therapy (Signed)
 OUTPATIENT PHYSICAL THERAPY SHOULDER TREATMENT       Patient Name: Hailey Perkins MRN: [id number] DOB:November 18, 1935, 89 y.o., female Today's Date: 11/07/2024  END OF SESSION:  PT End of Session - 11/06/24 1453     Visit Number 31    Number of Visits 36    Authorization Type BC[employer] MCR    PT Start Time 1408    PT Stop Time 1452    PT Time Calculation (min) 44 min    Activity Tolerance Patient tolerated treatment well    Behavior During Therapy WFL for tasks assessed/performed                       Past Medical History:  Diagnosis Date   Aortic atherosclerosis    Arthritis    Cataract 2010   bilateral eyes   Chicken pox    Cholelithiasis    Diverticulitis    Diverticulosis    GERD (gastroesophageal reflux disease)    Glaucoma    Hiatal hernia    History of frequent urinary tract infections    Hyperlipidemia    Osteopenia    Peripheral neuropathy    Sleep apnea    wears a CPAP   Urine incontinence    Past Surgical History:  Procedure Laterality Date   ABDOMINAL HYSTERECTOMY  1977   ANAL FISTULECTOMY  1985   ANKLE FRACTURE SURGERY Right 2014   APPENDECTOMY     BIOPSY  02/15/2021   Procedure: BIOPSY;  Surgeon: Wilhelmenia Aloha Raddle., MD;  Location: [facility];  Service: Gastroenterology;;   BREAST BIOPSY Bilateral 10+ yrs ago   BENIGN   BUNIONECTOMY Left 1975   Left Toe   CATARACT EXTRACTION, BILATERAL  2009   COLONOSCOPY WITH PROPOFOL  N/A 02/15/2021   Procedure: COLONOSCOPY WITH PROPOFOL ;  Surgeon: Wilhelmenia Aloha Raddle., MD;  Location: [facility];  Service: Gastroenterology;  Laterality: N/A;  ultraslimscope    DILATION AND CURETTAGE OF UTERUS  1972   eye PRK vision correction Left 10/09/2013   eye socket re-formed Left 2014   FOOT NEUROMA SURGERY Right 2003   plus bunionectomy, right great toe   HAMMER TOE SURGERY Right 2005   Right foot correction hammertoe little toe   HEMORRHOID SURGERY  1967   HIP SURGERY Right 2014    right hip femur pin implanted   KNEE ARTHROSCOPY Left 2010   debridement left knee scar tissue   LAPAROSCOPIC OOPHERECTOMY     1986, 1990   LASIK  1998   NEUROMA SURGERY Right 2003   Right Foot Neuroma, plus Bunionectomy, Right Great Toe   POLYPECTOMY  02/15/2021   Procedure: POLYPECTOMY;  Surgeon: Wilhelmenia Aloha Raddle., MD;  Location: [facility];  Service: Gastroenterology;;   RECTOCELE REPAIR  1968   REPLACEMENT TOTAL KNEE Left 01/2008   REPLACEMENT TOTAL KNEE Right 08/2008   retinal pucker correction Left 07/14/2012   schwannoma tumor (benign) removal  2011   right rib   SIGMOIDOSCOPY     TONSILLECTOMY AND ADENOIDECTOMY     VENTRAL HERNIA REPAIR  1993   Patient Active Problem List   Diagnosis Date Noted   Precordial chest pain 10/14/2024   Arthralgia of right elbow 08/24/2022   Bilateral wrist pain 08/24/2022   Neck pain 08/24/2022   Pain of right hip joint 08/24/2022   Chronic pain of right knee 05/27/2021   OSA on CPAP 05/27/2021   LLQ pain 12/16/2020   History of diverticulitis 12/16/2020  Hematochezia 12/16/2020   Abnormal colonoscopy 12/16/2020   Acquired trigger finger of right index finger 10/26/2020   Other specified arthritis, right hand 10/26/2020   Diverticulitis of colon 08/15/2020   Dry eyes, bilateral 10/10/2019   Sleep-related hypoxia 08/05/2019   OSA (obstructive sleep apnea) 08/05/2019   PLMD (periodic limb movement disorder) 08/05/2019   Cerebellar 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) 05/06/2019   Excessive daytime sleepiness 05/06/2019   Loud snoring 05/06/2019   Ventral hernia without obstruction or gangrene 02/22/2019   Chronic throat clearing 01/18/2019   Nocturnal cough 01/18/2019   Family history of colon cancer in father 01/18/2019   Diverticulosis 01/18/2019   Gait abnormality 12/27/2018   Numbness 12/27/2018   Status post total bilateral knee replacement 11/30/2018   DDD (degenerative disc disease), cervical 11/30/2018    Status post carpal tunnel release 11/30/2018   DDD (degenerative disc disease), lumbar 10/26/2018   Primary osteoarthritis of both hands 10/26/2018   Primary osteoarthritis of both feet 10/26/2018   Gastroesophageal reflux disease 08/27/2018   Bilateral impacted cerumen 08/21/2018   Sensorineural hearing loss (SNHL) of both ears 08/21/2018   Tinnitus, bilateral 08/21/2018   Blood in urine 04/17/2018   Secondary open-angle glaucoma of left eye, moderate stage 03/10/2018   Early stage 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 10/05/2017   Epiretinal membrane (ERM) of left eye 10/05/2017   Pseudophakia of both eyes 10/05/2017   Osteopenia 03/19/2017   Mixed hyperlipidemia 08/18/2016   Generalized osteoarthritis of multiple sites 08/18/2016   Peripheral neuropathic pain 08/18/2016   Overactive bladder 08/18/2016   Glaucoma 08/18/2016   Cervical disc disorder with radiculopathy of cervical region 08/17/2016   Carpal tunnel syndrome, right upper limb 08/17/2016   Abnormal finding on mammography 06/14/2016   Breast lump 05/06/2015    PCP: Yolande Toribio MATSU, MD  REFERRING PROVIDER:  Yolande Toribio MATSU, MD   REFERRING DIAG:  R26.81 (ICD-10-CM) - Unsteadiness on feet/Unsteady gait   THERAPY DIAG:  Other abnormalities of gait and mobility  Unsteadiness on feet  Muscle weakness (generalized)  Right shoulder pain, unspecified chronicity  Stiffness of right shoulder, not elsewhere classified  Rationale for Evaluation and Treatment: Rehabilitation  ONSET DATE: 2021 ; PT order 04/04/2024  SUBJECTIVE:   SUBJECTIVE STATEMENT: Pt states she did ok after prior visit and had no increased shoulder pain.  Pt reports she was only able to go 50% of the motion with supine wand flexion yesterday though improved with increased repetitions.  Pt has increased pain with getting rollator out of her car.  Pt performed tandem gait with UE support on counter at home.  Pt reports  compliance with HEP.     PERTINENT HISTORY: Pt uses a SPC with walking.  Flexor tenotomy on 3 toes of R foot on 05/01/24--Pt reports she has no restrictions from MD. Arthritis, osteopenia, peripheral neuropathy Bilat TKR  PAIN:  R shoulder:  3/10     PRECAUTIONS: Fall and Other: peripheral neuropathy   WEIGHT BEARING RESTRICTIONS: No  FALLS:  Has patient fallen in last 6 months? No  LIVING ENVIRONMENT: Lives with: lives alone Lives in:  1 story home Stairs: No Has following equipment at home: Single point cane, shower chair, and Grab bars  OCCUPATION:  Retired  PLOF: Independent  PATIENT GOALS: improved ability to pivot.  Improved walking on uneven terrain and performing stairs   OBJECTIVE:  Note: Objective measures were completed at Evaluation unless otherwise noted.  DIAGNOSTIC FINDINGS: Pt reports x rays were  negative for fracture.   TREATMENT DATE:   11/06/24  Nustep lvl 5 bilat UE/Les x 6 mins Standing Utter walks x 10 Standing rows with RTB x 20  Supine serratus punch x 10  0#, 2x10 1# Supine shoulder ABC x 1 rep Attempted standing toe raises though unable to perform Standing heel raises 2x10 Step ups and step downs on 6 inch step x10 reps each bilat with rail Tandem gait with rail Seated ankle DF with YTB x10 bilat  Reviewed HEP.                                                                                                                                PATIENT EDUCATION:  Education details:  HEP, rationale of interventions, POC, dx, exercise form, and relevant anatomy.  PT answered pt's questions.   Person educated: Patient Education method: Explanation, demonstration, verbal cues Education comprehension: verbalized understanding and needs further education, verbal cues required, returned demonstration  HOME EXERCISE PROGRAM:   Access Code: [email or web address] URL: [email or web address] Date: 09/17/2024 Prepared by: Mose Minerva   ASSESSMENT:  CLINICAL IMPRESSION:  Pt presents to treatment with SPC.  Pt unable to perform standing toe raises  Pt reports no recent improvement in shoulder, though a 50-60% improvement in shoulder overall.  PT assessed elbow and shoulder ROM.  Pt demonstrates improved R elbow extension and shoulder flexion AROM by 9 deg and 33 deg respectively.  Pt is challenged with sit to stands with T-band around LE's.  It required pt multiple attempts with certain reps of sit to stands and she also required min assist a couple of times to correct LOB.  PT increased height of step downs today.  Pt met STG's #4,5.  She responded well to treatment reporting no c/o's and having mild fatige after treatment.  Pt should benefit from cont skilled PT to address impairments and ongoing goals and to improve overall function.    OBJECTIVE IMPAIRMENTS: Abnormal gait, decreased activity tolerance, decreased balance, decreased endurance, decreased mobility, difficulty walking, and decreased strength.   ACTIVITY LIMITATIONS: standing, squatting, stairs, transfers, and locomotion level  PARTICIPATION LIMITATIONS: cleaning and community activity  PERSONAL FACTORS: 3+ comorbidities: Arthritis, bilat TKA, peripheral neuropathy are also affecting patient's functional outcome.   REHAB POTENTIAL: Good  CLINICAL DECISION MAKING: Stable/uncomplicated  EVALUATION COMPLEXITY: Low   GOALS:   SHORT TERM GOALS: Target date:  06/05/2024   Pt will be independent and compliant with HEP for improved strength, balance, stability, and mobility.  Baseline: Goal status: GOAL MET 06/27/24  2.  Pt will demo improved time on TUG test by at least 5 seconds for improved mobility and reduced fall risk.   Baseline:  Goal status: 80% MET  3.  Pt will report at least a 25% improvement in her balance.   Baseline:  Goal status: GOAL MET  06/27/24  4.  Pt will report at least a 25% improvement in daily usage of R  UE without  adverse effects.  Goal Status:  GOAL MET  11/04/24 Target date: 10/09/2024  5.  Pt will demo at least a 12-15 deg increase in R shoulder flexion AROM and a 5-7 deg increase in R elbow extension AROM for improved performance of reaching and performance of daily activities.  Goal Status:  GOAL MET  11/04/24 Target date: 10/16/2024      LONG TERM GOALS: Target date:  11/14/2024   Pt will score at least 44/56 on the Berg Balance Assessment for improved balance and stability with daily tasks and mobility.  Baseline:  35/56 initial,  40/56 on 11/6 Goal status: NOT MET  2.  Pt will be able to load and unload her dishwasher with good stability and good confidence.  Baseline:  Goal status: GOAL MET  with holding onto counter  9/4  3.  Pt will demo improved bilat LE strength to 4+/5 in hip flexion, 5/5 in knee extension, 4-/5 in R ankle DF, and a 5# increase in L hip abduction for improved performance of functional mobility and performance of household chores.  Baseline:  Goal status:  ONGOING  4.  Pt will be able to ascend and descend stairs with a reciprocal gait with the rail.   Baseline:  Goal status: PROGRESSING  9/4  5.  Pt will report she is able to perform her normal ambulation with cane with good stability and confidence and without LOB. Baseline:  Goal status:ONGOING  6.  Pt will report she is able to perform her normal reaching activities including reaching into an overhead cabinet without significant difficulty and pain.   Goal status:  INITIAL  7.  Pt will be able to dress without difficulty.   Goal status:  INITIAL  8.  Pt will be able to perform her ADLs without significant shoulder pain and difficulty.   Goal status:  INITIAL  9.  Pt will report at least a 50% improvement in balance with turning her head and also closing heavy doors.   Goal status:  INITIAL   PLAN:  PT FREQUENCY: 2x/wk  PT DURATION: 5 weeks  PLANNED INTERVENTIONS: [id number]- PT Re-evaluation,  97750- Physical Performance Testing, 97110-Therapeutic exercises, 97530- Therapeutic activity, [id number]- Neuromuscular re-education, 97535- Self Care, [id number]- Manual therapy, [phone]- Gait training, [phone]- Aquatic Therapy, [phone]- Electrical stimulation (unattended), Patient/Family education, Balance training, Stair training, Taping, DME instructions, Cryotherapy, and Moist heat  PLAN FOR NEXT SESSION: Work on [employer].  LE strengthening and balance activities.  Work on step ups and step downs.  Cont with shoulder ROM.  Perform pulleys   Leigh Minerva III PT, DPT 11/07/24 9:07 PM

## 2024-11-07 ENCOUNTER — Encounter (HOSPITAL_BASED_OUTPATIENT_CLINIC_OR_DEPARTMENT_OTHER): Payer: Self-pay | Admitting: Physical Therapy

## 2024-11-11 ENCOUNTER — Ambulatory Visit (HOSPITAL_BASED_OUTPATIENT_CLINIC_OR_DEPARTMENT_OTHER): Admitting: Physical Therapy

## 2024-11-11 ENCOUNTER — Encounter (HOSPITAL_BASED_OUTPATIENT_CLINIC_OR_DEPARTMENT_OTHER): Payer: Self-pay | Admitting: Physical Therapy

## 2024-11-11 DIAGNOSIS — M6281 Muscle weakness (generalized): Secondary | ICD-10-CM

## 2024-11-11 DIAGNOSIS — R2681 Unsteadiness on feet: Secondary | ICD-10-CM

## 2024-11-11 DIAGNOSIS — M25511 Pain in right shoulder: Secondary | ICD-10-CM

## 2024-11-11 DIAGNOSIS — M25611 Stiffness of right shoulder, not elsewhere classified: Secondary | ICD-10-CM

## 2024-11-11 DIAGNOSIS — R2689 Other abnormalities of gait and mobility: Secondary | ICD-10-CM

## 2024-11-11 NOTE — Therapy (Signed)
 OUTPATIENT PHYSICAL THERAPY TREATMENT       Patient Name: Hailey Perkins MRN: [id number] DOB:13-Oct-1935, 89 y.o., female Today's Date: 11/12/2024  END OF SESSION:  PT End of Session - 11/11/24 1330     Visit Number 32    Number of Visits 36    Date for Recertification  11/14/24    Authorization Type BCBS MCR    PT Start Time 1323    PT Stop Time 1413    PT Time Calculation (min) 50 min    Activity Tolerance Patient tolerated treatment well    Behavior During Therapy WFL for tasks assessed/performed                       Past Medical History:  Diagnosis Date   Aortic atherosclerosis    Arthritis    Cataract 2010   bilateral eyes   Chicken pox    Cholelithiasis    Diverticulitis    Diverticulosis    GERD (gastroesophageal reflux disease)    Glaucoma    Hiatal hernia    History of frequent urinary tract infections    Hyperlipidemia    Osteopenia    Peripheral neuropathy    Sleep apnea    wears a CPAP   Urine incontinence    Past Surgical History:  Procedure Laterality Date   ABDOMINAL HYSTERECTOMY  1977   ANAL FISTULECTOMY  1985   ANKLE FRACTURE SURGERY Right 2014   APPENDECTOMY     BIOPSY  02/15/2021   Procedure: BIOPSY;  Surgeon: Wilhelmenia Aloha Raddle., MD;  Location: [facility];  Service: Gastroenterology;;   BREAST BIOPSY Bilateral 10+ yrs ago   BENIGN   BUNIONECTOMY Left 1975   Left Toe   CATARACT EXTRACTION, BILATERAL  2009   COLONOSCOPY WITH PROPOFOL  N/A 02/15/2021   Procedure: COLONOSCOPY WITH PROPOFOL ;  Surgeon: Wilhelmenia Aloha Raddle., MD;  Location: [facility];  Service: Gastroenterology;  Laterality: N/A;  ultraslimscope    DILATION AND CURETTAGE OF UTERUS  1972   eye PRK vision correction Left 10/09/2013   eye socket re-formed Left 2014   FOOT NEUROMA SURGERY Right 2003   plus bunionectomy, right great toe   HAMMER TOE SURGERY Right 2005   Right foot correction hammertoe little toe   HEMORRHOID SURGERY  1967    HIP SURGERY Right 2014   right hip femur pin implanted   KNEE ARTHROSCOPY Left 2010   debridement left knee scar tissue   LAPAROSCOPIC OOPHERECTOMY     1986, 1990   LASIK  1998   NEUROMA SURGERY Right 2003   Right Foot Neuroma, plus Bunionectomy, Right Great Toe   POLYPECTOMY  02/15/2021   Procedure: POLYPECTOMY;  Surgeon: Wilhelmenia Aloha Raddle., MD;  Location: [facility];  Service: Gastroenterology;;   RECTOCELE REPAIR  1968   REPLACEMENT TOTAL KNEE Left 01/2008   REPLACEMENT TOTAL KNEE Right 08/2008   retinal pucker correction Left 07/14/2012   schwannoma tumor (benign) removal  2011   right rib   SIGMOIDOSCOPY     TONSILLECTOMY AND ADENOIDECTOMY     VENTRAL HERNIA REPAIR  1993   Patient Active Problem List   Diagnosis Date Noted   Precordial chest pain 10/14/2024   Arthralgia of right elbow 08/24/2022   Bilateral wrist pain 08/24/2022   Neck pain 08/24/2022   Pain of right hip joint 08/24/2022   Chronic pain of right knee 05/27/2021   OSA on CPAP 05/27/2021   LLQ pain 12/16/2020  History of diverticulitis 12/16/2020   Hematochezia 12/16/2020   Abnormal colonoscopy 12/16/2020   Acquired trigger finger of right index finger 10/26/2020   Other specified arthritis, right hand 10/26/2020   Diverticulitis of colon 08/15/2020   Dry eyes, bilateral 10/10/2019   Sleep-related hypoxia 08/05/2019   OSA (obstructive sleep apnea) 08/05/2019   PLMD (periodic limb movement disorder) 08/05/2019   Cerebellar 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) 05/06/2019   Excessive daytime sleepiness 05/06/2019   Loud snoring 05/06/2019   Ventral hernia without obstruction or gangrene 02/22/2019   Chronic throat clearing 01/18/2019   Nocturnal cough 01/18/2019   Family history of colon cancer in father 01/18/2019   Diverticulosis 01/18/2019   Gait abnormality 12/27/2018   Numbness 12/27/2018   Status post total bilateral knee replacement 11/30/2018   DDD (degenerative disc  disease), cervical 11/30/2018   Status post carpal tunnel release 11/30/2018   DDD (degenerative disc disease), lumbar 10/26/2018   Primary osteoarthritis of both hands 10/26/2018   Primary osteoarthritis of both feet 10/26/2018   Gastroesophageal reflux disease 08/27/2018   Bilateral impacted cerumen 08/21/2018   Sensorineural hearing loss (SNHL) of both ears 08/21/2018   Tinnitus, bilateral 08/21/2018   Blood in urine 04/17/2018   Secondary open-angle glaucoma of left eye, moderate stage 03/10/2018   Early stage 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 10/05/2017   Epiretinal membrane (ERM) of left eye 10/05/2017   Pseudophakia of both eyes 10/05/2017   Osteopenia 03/19/2017   Mixed hyperlipidemia 08/18/2016   Generalized osteoarthritis of multiple sites 08/18/2016   Peripheral neuropathic pain 08/18/2016   Overactive bladder 08/18/2016   Glaucoma 08/18/2016   Cervical disc disorder with radiculopathy of cervical region 08/17/2016   Carpal tunnel syndrome, right upper limb 08/17/2016   Abnormal finding on mammography 06/14/2016   Breast lump 05/06/2015    PCP: Yolande Toribio MATSU, MD  REFERRING PROVIDER:  Yolande Toribio MATSU, MD   REFERRING DIAG:  R26.81 (ICD-10-CM) - Unsteadiness on feet/Unsteady gait   THERAPY DIAG:  Other abnormalities of gait and mobility  Unsteadiness on feet  Muscle weakness (generalized)  Right shoulder pain, unspecified chronicity  Stiffness of right shoulder, not elsewhere classified  Rationale for Evaluation and Treatment: Rehabilitation  ONSET DATE: 2021 ; PT order 04/04/2024  SUBJECTIVE:   SUBJECTIVE STATEMENT: Pt had a massage earlier this AM.  Pt denies any adverse effects after prior visit.  Pt performed tandem gait with UE support on counter at home.  Pt reports compliance with HEP.     PERTINENT HISTORY: Pt uses a SPC with walking.  Flexor tenotomy on 3 toes of R foot on 05/01/24--Pt reports she has no  restrictions from MD. Arthritis, osteopenia, peripheral neuropathy Bilat TKR  PAIN:  R shoulder:  2/10 Pt states her R knee has been bothering her some this AM which is due to the weather.      PRECAUTIONS: Fall and Other: peripheral neuropathy   WEIGHT BEARING RESTRICTIONS: No  FALLS:  Has patient fallen in last 6 months? No  LIVING ENVIRONMENT: Lives with: lives alone Lives in:  1 story home Stairs: No Has following equipment at home: Single point cane, shower chair, and Grab bars  OCCUPATION:  Retired  PLOF: Independent  PATIENT GOALS: improved ability to pivot.  Improved walking on uneven terrain and performing stairs   OBJECTIVE:  Note: Objective measures were completed at Evaluation unless otherwise noted.  DIAGNOSTIC FINDINGS: Pt reports x rays were negative for fracture.   TREATMENT DATE:  11/11/24  Nustep lvl 5 bilat UE/Les x 5 mins Standing Revere walks x 10 Sit to stands with GTB around LE's 2x10 with SBA to mod assist Tandem gait with rail x 3 laps Half tandem stance 2x25-30 sec with UE support as needed.  Standing heel raises 2x10 Alt toe taps on hurdle with UE support on rail with SBA Supine shoulder ABC x 1 rep Stepping over 2 hurdles with step to gait pattern with rail with SBA  Reviewed HEP and updated HEP.  PT gave pt a HEP handout and was educated in correct form and appropriate frequency.  Pt instructed pt to use a sturdy support such as a counter with tandem gait.                                                                                                                                 PATIENT EDUCATION:  Education details:  HEP, rationale of interventions, POC, dx, exercise form, and relevant anatomy.  PT answered pt's questions.   Person educated: Patient Education method: Explanation, demonstration, verbal cues Education comprehension: verbalized understanding and needs further education, verbal cues required, returned  demonstration  HOME EXERCISE PROGRAM:   Access Code: [email or web address] URL: [email or web address] Date: 09/17/2024 Prepared by: Mose Minerva  Updated HEP: - Standing Shoulder Flexion Stoltz Walk  - 1 x daily - 7 x weekly - 1 sets - 7-10 reps - Supine Shoulder Alphabet  - 1 x daily - 7 x weekly - 1 reps - Tandem Walking with Counter Support  - 1 x daily - 5 x weekly - 2-3 reps   ASSESSMENT:  CLINICAL IMPRESSION:  Pt continues to have difficulty with sit to stands from chair without using Ue's with theraband around LE's.  She required min asssist on one repetition and mod assist for one repetition.  It also required her multiple attempts to complete on certain repetitions.  Pt performed standing Geis walks in flexion well without pain and c/o's.  PT had pt perform stepping over hurdles to improve foot clearance and stability with gait.  Pt required rail for support with stepping over hurdles and performed a step to pattern.  Pt had to use B/R during treatment which accounted for unbilled time.  PT updated HEP and educated pt in correct form and appropriate frequency.  Pt tolerated treatment well and had no pain and no c/o's after treatment.    OBJECTIVE IMPAIRMENTS: Abnormal gait, decreased activity tolerance, decreased balance, decreased endurance, decreased mobility, difficulty walking, and decreased strength.   ACTIVITY LIMITATIONS: standing, squatting, stairs, transfers, and locomotion level  PARTICIPATION LIMITATIONS: cleaning and community activity  PERSONAL FACTORS: 3+ comorbidities: Arthritis, bilat TKA, peripheral neuropathy are also affecting patient's functional outcome.   REHAB POTENTIAL: Good  CLINICAL DECISION MAKING: Stable/uncomplicated  EVALUATION COMPLEXITY: Low   GOALS:   SHORT TERM GOALS: Target date:  06/05/2024   Pt will be independent and compliant with HEP for improved strength, balance,  stability, and mobility.  Baseline: Goal status: GOAL MET  06/27/24  2.  Pt will demo improved time on TUG test by at least 5 seconds for improved mobility and reduced fall risk.   Baseline:  Goal status: 80% MET  3.  Pt will report at least a 25% improvement in her balance.   Baseline:  Goal status: GOAL MET  06/27/24  4.  Pt will report at least a 25% improvement in daily usage of R UE without adverse effects.  Goal Status:  GOAL MET  11/04/24 Target date: 10/09/2024  5.  Pt will demo at least a 12-15 deg increase in R shoulder flexion AROM and a 5-7 deg increase in R elbow extension AROM for improved performance of reaching and performance of daily activities.  Goal Status:  GOAL MET  11/04/24 Target date: 10/16/2024      LONG TERM GOALS: Target date:  11/14/2024   Pt will score at least 44/56 on the Berg Balance Assessment for improved balance and stability with daily tasks and mobility.  Baseline:  35/56 initial,  40/56 on 11/6 Goal status: NOT MET  2.  Pt will be able to load and unload her dishwasher with good stability and good confidence.  Baseline:  Goal status: GOAL MET  with holding onto counter  9/4  3.  Pt will demo improved bilat LE strength to 4+/5 in hip flexion, 5/5 in knee extension, 4-/5 in R ankle DF, and a 5# increase in L hip abduction for improved performance of functional mobility and performance of household chores.  Baseline:  Goal status:  ONGOING  4.  Pt will be able to ascend and descend stairs with a reciprocal gait with the rail.   Baseline:  Goal status: PROGRESSING  9/4  5.  Pt will report she is able to perform her normal ambulation with cane with good stability and confidence and without LOB. Baseline:  Goal status:ONGOING  6.  Pt will report she is able to perform her normal reaching activities including reaching into an overhead cabinet without significant difficulty and pain.   Goal status:  INITIAL  7.  Pt will be able to dress without difficulty.   Goal status:  INITIAL  8.  Pt will be  able to perform her ADLs without significant shoulder pain and difficulty.   Goal status:  INITIAL  9.  Pt will report at least a 50% improvement in balance with turning her head and also closing heavy doors.   Goal status:  INITIAL   PLAN:  PT FREQUENCY: 2x/wk  PT DURATION: 5 weeks  PLANNED INTERVENTIONS: [id number]- PT Re-evaluation, 97750- Physical Performance Testing, 97110-Therapeutic exercises, 97530- Therapeutic activity, [id number]- Neuromuscular re-education, 97535- Self Care, [id number]- Manual therapy, [phone]- Gait training, [phone]- Aquatic Therapy, [phone]- Electrical stimulation (unattended), Patient/Family education, Balance training, Stair training, Taping, DME instructions, Cryotherapy, and Moist heat  PLAN FOR NEXT SESSION: Work on [employer].  LE strengthening and balance activities.  Work on step ups and step downs.  Cont with shoulder ROM.  Perform pulleys   Leigh Minerva III PT, DPT 11/12/24 9:59 PM

## 2024-11-13 ENCOUNTER — Encounter (HOSPITAL_BASED_OUTPATIENT_CLINIC_OR_DEPARTMENT_OTHER): Payer: Self-pay | Admitting: Physical Therapy

## 2024-11-13 ENCOUNTER — Ambulatory Visit (HOSPITAL_BASED_OUTPATIENT_CLINIC_OR_DEPARTMENT_OTHER): Admitting: Physical Therapy

## 2024-11-13 DIAGNOSIS — R2681 Unsteadiness on feet: Secondary | ICD-10-CM

## 2024-11-13 DIAGNOSIS — R2689 Other abnormalities of gait and mobility: Secondary | ICD-10-CM | POA: Diagnosis not present

## 2024-11-13 DIAGNOSIS — M25511 Pain in right shoulder: Secondary | ICD-10-CM

## 2024-11-13 DIAGNOSIS — M6281 Muscle weakness (generalized): Secondary | ICD-10-CM

## 2024-11-13 DIAGNOSIS — M25611 Stiffness of right shoulder, not elsewhere classified: Secondary | ICD-10-CM

## 2024-11-13 NOTE — Therapy (Signed)
 OUTPATIENT PHYSICAL THERAPY TREATMENT       Patient Name: Hailey Perkins MRN: [id number] DOB:21-Jan-1935, 89 y.o., female Today's Date: 11/14/2024  END OF SESSION:  PT End of Session - 11/13/24 1414     Visit Number 33    Number of Visits 36    Date for Recertification  11/14/24    Authorization Type BCBS MCR    PT Start Time 1411   Pt arrived 11 mins late to treatment   PT Stop Time 1445    PT Time Calculation (min) 34 min    Activity Tolerance Patient tolerated treatment well    Behavior During Therapy [facility] for tasks assessed/performed                       Past Medical History:  Diagnosis Date   Aortic atherosclerosis    Arthritis    Cataract 2010   bilateral eyes   Chicken pox    Cholelithiasis    Diverticulitis    Diverticulosis    GERD (gastroesophageal reflux disease)    Glaucoma    Hiatal hernia    History of frequent urinary tract infections    Hyperlipidemia    Osteopenia    Peripheral neuropathy    Sleep apnea    wears a CPAP   Urine incontinence    Past Surgical History:  Procedure Laterality Date   ABDOMINAL HYSTERECTOMY  1977   ANAL FISTULECTOMY  1985   ANKLE FRACTURE SURGERY Right 2014   APPENDECTOMY     BIOPSY  02/15/2021   Procedure: BIOPSY;  Surgeon: Wilhelmenia Aloha Raddle., MD;  Location: [facility];  Service: Gastroenterology;;   BREAST BIOPSY Bilateral 10+ yrs ago   BENIGN   BUNIONECTOMY Left 1975   Left Toe   CATARACT EXTRACTION, BILATERAL  2009   COLONOSCOPY WITH PROPOFOL  N/A 02/15/2021   Procedure: COLONOSCOPY WITH PROPOFOL ;  Surgeon: Wilhelmenia Aloha Raddle., MD;  Location: [facility];  Service: Gastroenterology;  Laterality: N/A;  ultraslimscope    DILATION AND CURETTAGE OF UTERUS  1972   eye PRK vision correction Left 10/09/2013   eye socket re-formed Left 2014   FOOT NEUROMA SURGERY Right 2003   plus bunionectomy, right great toe   HAMMER TOE SURGERY Right 2005   Right foot correction hammertoe  little toe   HEMORRHOID SURGERY  1967   HIP SURGERY Right 2014   right hip femur pin implanted   KNEE ARTHROSCOPY Left 2010   debridement left knee scar tissue   LAPAROSCOPIC OOPHERECTOMY     1986, 1990   LASIK  1998   NEUROMA SURGERY Right 2003   Right Foot Neuroma, plus Bunionectomy, Right Great Toe   POLYPECTOMY  02/15/2021   Procedure: POLYPECTOMY;  Surgeon: Wilhelmenia Aloha Raddle., MD;  Location: [facility];  Service: Gastroenterology;;   RECTOCELE REPAIR  1968   REPLACEMENT TOTAL KNEE Left 01/2008   REPLACEMENT TOTAL KNEE Right 08/2008   retinal pucker correction Left 07/14/2012   schwannoma tumor (benign) removal  2011   right rib   SIGMOIDOSCOPY     TONSILLECTOMY AND ADENOIDECTOMY     VENTRAL HERNIA REPAIR  1993   Patient Active Problem List   Diagnosis Date Noted   Precordial chest pain 10/14/2024   Arthralgia of right elbow 08/24/2022   Bilateral wrist pain 08/24/2022   Neck pain 08/24/2022   Pain of right hip joint 08/24/2022   Chronic pain of right knee 05/27/2021   OSA  on CPAP 05/27/2021   LLQ pain 12/16/2020   History of diverticulitis 12/16/2020   Hematochezia 12/16/2020   Abnormal colonoscopy 12/16/2020   Acquired trigger finger of right index finger 10/26/2020   Other specified arthritis, right hand 10/26/2020   Diverticulitis of colon 08/15/2020   Dry eyes, bilateral 10/10/2019   Sleep-related hypoxia 08/05/2019   OSA (obstructive sleep apnea) 08/05/2019   PLMD (periodic limb movement disorder) 08/05/2019   Cerebellar 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) 05/06/2019   Excessive daytime sleepiness 05/06/2019   Loud snoring 05/06/2019   Ventral hernia without obstruction or gangrene 02/22/2019   Chronic throat clearing 01/18/2019   Nocturnal cough 01/18/2019   Family history of colon cancer in father 01/18/2019   Diverticulosis 01/18/2019   Gait abnormality 12/27/2018   Numbness 12/27/2018   Status post total bilateral knee replacement  11/30/2018   DDD (degenerative disc disease), cervical 11/30/2018   Status post carpal tunnel release 11/30/2018   DDD (degenerative disc disease), lumbar 10/26/2018   Primary osteoarthritis of both hands 10/26/2018   Primary osteoarthritis of both feet 10/26/2018   Gastroesophageal reflux disease 08/27/2018   Bilateral impacted cerumen 08/21/2018   Sensorineural hearing loss (SNHL) of both ears 08/21/2018   Tinnitus, bilateral 08/21/2018   Blood in urine 04/17/2018   Secondary open-angle glaucoma of left eye, moderate stage 03/10/2018   Early stage 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 10/05/2017   Epiretinal membrane (ERM) of left eye 10/05/2017   Pseudophakia of both eyes 10/05/2017   Osteopenia 03/19/2017   Mixed hyperlipidemia 08/18/2016   Generalized osteoarthritis of multiple sites 08/18/2016   Peripheral neuropathic pain 08/18/2016   Overactive bladder 08/18/2016   Glaucoma 08/18/2016   Cervical disc disorder with radiculopathy of cervical region 08/17/2016   Carpal tunnel syndrome, right upper limb 08/17/2016   Abnormal finding on mammography 06/14/2016   Breast lump 05/06/2015    PCP: Yolande Toribio MATSU, MD  REFERRING PROVIDER:  Yolande Toribio MATSU, MD   REFERRING DIAG:  R26.81 (ICD-10-CM) - Unsteadiness on feet/Unsteady gait   THERAPY DIAG:  Other abnormalities of gait and mobility  Unsteadiness on feet  Muscle weakness (generalized)  Right shoulder pain, unspecified chronicity  Stiffness of right shoulder, not elsewhere classified  Rationale for Evaluation and Treatment: Rehabilitation  ONSET DATE: 2021 ; PT order 04/04/2024  SUBJECTIVE:   SUBJECTIVE STATEMENT: Pt denies any adverse effects after prior treatment.  Pt performed home exercises this AM.    had a massage earlier this AM.  Pt denies any adverse effects after prior visit.  Pt performed tandem gait with UE support on counter at home.  Pt reports compliance with HEP.      PERTINENT HISTORY: Pt uses a SPC with walking.  Flexor tenotomy on 3 toes of R foot on 05/01/24--Pt reports she has no restrictions from MD. Arthritis, osteopenia, peripheral neuropathy Bilat TKR  PAIN:  R shoulder:  1-2/10 Pt states she has a familiar pain in R knee.  She states this pain comes and goes.    PRECAUTIONS: Fall and Other: peripheral neuropathy   WEIGHT BEARING RESTRICTIONS: No  FALLS:  Has patient fallen in last 6 months? No  LIVING ENVIRONMENT: Lives with: lives alone Lives in:  1 story home Stairs: No Has following equipment at home: Single point cane, shower chair, and Grab bars  OCCUPATION:  Retired  PLOF: Independent  PATIENT GOALS: improved ability to pivot.  Improved walking on uneven terrain and performing stairs   OBJECTIVE:  Note:  Objective measures were completed at Evaluation unless otherwise noted.  DIAGNOSTIC FINDINGS: Pt reports x rays were negative for fracture.   TREATMENT DATE:   11/13/24  Nustep lvl 5 bilat UE/Les x 5 mins Standing Favorite walks x 10 Sit to stands without theraband x10 with SBA and one occasion of CGA Half tandem stance 2x30 sec with UE support as needed with supervision to CGA  Standing DF one foot at a time 2x10 Stepping over 2 hurdles with step to gait pattern and stepping over 2 and 3 hurdles with a reciprocal gait pattern with rail with SBA  Sidestepping on airex beam with UE support on rail with SBA                                                                                                                               PATIENT EDUCATION:  Education details:  HEP, rationale of interventions, POC, dx, exercise form, and relevant anatomy.  PT answered pt's questions.   Person educated: Patient Education method: Explanation, demonstration, verbal cues Education comprehension: verbalized understanding and needs further education, verbal cues required, returned demonstration  HOME EXERCISE  PROGRAM:   Access Code: [email or web address] URL: [email or web address] Date: 09/17/2024 Prepared by: Mose Minerva  Updated HEP: - Standing Shoulder Flexion [place] Walk  - 1 x daily - 7 x weekly - 1 sets - 7-10 reps - Supine Shoulder Alphabet  - 1 x daily - 7 x weekly - 1 reps - Tandem Walking with Counter Support  - 1 x daily - 5 x weekly - 2-3 reps   ASSESSMENT:  CLINICAL IMPRESSION:  Treatment time was limited today due to pt arriving late.  Pt performed exercises to improve shoulder ROM, LE and functional strength, balance/stability, and functional mobility.  PT had pt perform sit to stands without theraband today for improved stability.  Pt has limited ankle DF bilat in standing though has much more difficulty with L LE.  Pt performed stepping over hurdles with rail and with SBA and was able to perform reciprocal stepping.  She responded well to treatment reporting she felt tired though not significantly tired after treatment.    OBJECTIVE IMPAIRMENTS: Abnormal gait, decreased activity tolerance, decreased balance, decreased endurance, decreased mobility, difficulty walking, and decreased strength.   ACTIVITY LIMITATIONS: standing, squatting, stairs, transfers, and locomotion level  PARTICIPATION LIMITATIONS: cleaning and community activity  PERSONAL FACTORS: 3+ comorbidities: Arthritis, bilat TKA, peripheral neuropathy are also affecting patient's functional outcome.   REHAB POTENTIAL: Good  CLINICAL DECISION MAKING: Stable/uncomplicated  EVALUATION COMPLEXITY: Low   GOALS:   SHORT TERM GOALS: Target date:  06/05/2024   Pt will be independent and compliant with HEP for improved strength, balance, stability, and mobility.  Baseline: Goal status: GOAL MET 06/27/24  2.  Pt will demo improved time on TUG test by at least 5 seconds for improved mobility and reduced fall risk.   Baseline:  Goal status: 80% MET  3.  Pt  will report at least a 25% improvement in her  balance.   Baseline:  Goal status: GOAL MET  06/27/24  4.  Pt will report at least a 25% improvement in daily usage of R UE without adverse effects.  Goal Status:  GOAL MET  11/04/24 Target date: 10/09/2024  5.  Pt will demo at least a 12-15 deg increase in R shoulder flexion AROM and a 5-7 deg increase in R elbow extension AROM for improved performance of reaching and performance of daily activities.  Goal Status:  GOAL MET  11/04/24 Target date: 10/16/2024      LONG TERM GOALS: Target date:  11/14/2024   Pt will score at least 44/56 on the Berg Balance Assessment for improved balance and stability with daily tasks and mobility.  Baseline:  35/56 initial,  40/56 on 11/6 Goal status: NOT MET  2.  Pt will be able to load and unload her dishwasher with good stability and good confidence.  Baseline:  Goal status: GOAL MET  with holding onto counter  9/4  3.  Pt will demo improved bilat LE strength to 4+/5 in hip flexion, 5/5 in knee extension, 4-/5 in R ankle DF, and a 5# increase in L hip abduction for improved performance of functional mobility and performance of household chores.  Baseline:  Goal status:  ONGOING  4.  Pt will be able to ascend and descend stairs with a reciprocal gait with the rail.   Baseline:  Goal status: PROGRESSING  9/4  5.  Pt will report she is able to perform her normal ambulation with cane with good stability and confidence and without LOB. Baseline:  Goal status:ONGOING  6.  Pt will report she is able to perform her normal reaching activities including reaching into an overhead cabinet without significant difficulty and pain.   Goal status:  INITIAL  7.  Pt will be able to dress without difficulty.   Goal status:  INITIAL  8.  Pt will be able to perform her ADLs without significant shoulder pain and difficulty.   Goal status:  INITIAL  9.  Pt will report at least a 50% improvement in balance with turning her head and also closing heavy doors.    Goal status:  INITIAL   PLAN:  PT FREQUENCY: 2x/wk  PT DURATION: 5 weeks  PLANNED INTERVENTIONS: 97164- PT Re-evaluation, 97750- Physical Performance Testing, 97110-Therapeutic exercises, 97530- Therapeutic activity, [id number]- Neuromuscular re-education, 97535- Self Care, [id number]- Manual therapy, [phone]- Gait training, [phone]- Aquatic Therapy, [phone]- Electrical stimulation (unattended), Patient/Family education, Balance training, Stair training, Taping, DME instructions, Cryotherapy, and Moist heat  PLAN FOR NEXT SESSION: Work on [employer].  LE strengthening and balance activities.  Work on step ups and step downs.  Cont with shoulder ROM.  Perform pulleys   Leigh Minerva III PT, DPT 11/15/2024 10:29 AM

## 2024-11-17 NOTE — Therapy (Signed)
 OUTPATIENT PHYSICAL THERAPY TREATMENT  Progress Note Reporting Period 10/16/2024 to 11/18/2024  See note below for Objective Data and Assessment of Progress/Goals.           Patient Name: Hailey Perkins MRN: [id number] DOB:02-05-1935, 89 y.o., female Today's Date: 11/19/2024  END OF SESSION:     PT End of Session - 11/18/24    Visit Number 34   Number of Visits 46   Date for PT Re-Evaluation 12/30/2024   Authorization Type [employer] MCR   PT Start Time  1325   PT Stop Time 1409   PT Time Calculation (min) 44 min   Activity Tolerance Pt tolerated treatment well   Behavior During Therapy  WFL for tasks assessed/performed               Past Medical History:  Diagnosis Date   Aortic atherosclerosis    Arthritis    Cataract 2010   bilateral eyes   Chicken pox    Cholelithiasis    Diverticulitis    Diverticulosis    GERD (gastroesophageal reflux disease)    Glaucoma    Hiatal hernia    History of frequent urinary tract infections    Hyperlipidemia    Osteopenia    Peripheral neuropathy    Sleep apnea    wears a CPAP   Urine incontinence    Past Surgical History:  Procedure Laterality Date   ABDOMINAL HYSTERECTOMY  1977   ANAL FISTULECTOMY  1985   ANKLE FRACTURE SURGERY Right 2014   APPENDECTOMY     BIOPSY  02/15/2021   Procedure: BIOPSY;  Surgeon: Wilhelmenia Aloha Raddle., MD;  Location: [facility];  Service: Gastroenterology;;   BREAST BIOPSY Bilateral 10+ yrs ago   BENIGN   BUNIONECTOMY Left 1975   Left Toe   CATARACT EXTRACTION, BILATERAL  2009   COLONOSCOPY WITH PROPOFOL  N/A 02/15/2021   Procedure: COLONOSCOPY WITH PROPOFOL ;  Surgeon: Wilhelmenia Aloha Raddle., MD;  Location: [facility];  Service: Gastroenterology;  Laterality: N/A;  ultraslimscope    DILATION AND CURETTAGE OF UTERUS  1972   eye PRK vision correction Left 10/09/2013   eye socket re-formed Left 2014   FOOT NEUROMA SURGERY Right 2003   plus bunionectomy, right  great toe   HAMMER TOE SURGERY Right 2005   Right foot correction hammertoe little toe   HEMORRHOID SURGERY  1967   HIP SURGERY Right 2014   right hip femur pin implanted   KNEE ARTHROSCOPY Left 2010   debridement left knee scar tissue   LAPAROSCOPIC OOPHERECTOMY     1986, 1990   LASIK  1998   NEUROMA SURGERY Right 2003   Right Foot Neuroma, plus Bunionectomy, Right Great Toe   POLYPECTOMY  02/15/2021   Procedure: POLYPECTOMY;  Surgeon: Wilhelmenia Aloha Raddle., MD;  Location: [facility];  Service: Gastroenterology;;   RECTOCELE REPAIR  1968   REPLACEMENT TOTAL KNEE Left 01/2008   REPLACEMENT TOTAL KNEE Right 08/2008   retinal pucker correction Left 07/14/2012   schwannoma tumor (benign) removal  2011   right rib   SIGMOIDOSCOPY     TONSILLECTOMY AND ADENOIDECTOMY     VENTRAL HERNIA REPAIR  1993   Patient Active Problem List   Diagnosis Date Noted   Precordial chest pain 10/14/2024   Arthralgia of right elbow 08/24/2022   Bilateral wrist pain 08/24/2022   Neck pain 08/24/2022   Pain of right hip joint 08/24/2022   Chronic pain of right knee 05/27/2021  OSA on CPAP 05/27/2021   LLQ pain 12/16/2020   History of diverticulitis 12/16/2020   Hematochezia 12/16/2020   Abnormal colonoscopy 12/16/2020   Acquired trigger finger of right index finger 10/26/2020   Other specified arthritis, right hand 10/26/2020   Diverticulitis of colon 08/15/2020   Dry eyes, bilateral 10/10/2019   Sleep-related hypoxia 08/05/2019   OSA (obstructive sleep apnea) 08/05/2019   PLMD (periodic limb movement disorder) 08/05/2019   Cerebellar 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) 05/06/2019   Excessive daytime sleepiness 05/06/2019   Loud snoring 05/06/2019   Ventral hernia without obstruction or gangrene 02/22/2019   Chronic throat clearing 01/18/2019   Nocturnal cough 01/18/2019   Family history of colon cancer in father 01/18/2019   Diverticulosis 01/18/2019   Gait abnormality  12/27/2018   Numbness 12/27/2018   Status post total bilateral knee replacement 11/30/2018   DDD (degenerative disc disease), cervical 11/30/2018   Status post carpal tunnel release 11/30/2018   DDD (degenerative disc disease), lumbar 10/26/2018   Primary osteoarthritis of both hands 10/26/2018   Primary osteoarthritis of both feet 10/26/2018   Gastroesophageal reflux disease 08/27/2018   Bilateral impacted cerumen 08/21/2018   Sensorineural hearing loss (SNHL) of both ears 08/21/2018   Tinnitus, bilateral 08/21/2018   Blood in urine 04/17/2018   Secondary open-angle glaucoma of left eye, moderate stage 03/10/2018   Early stage 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 10/05/2017   Epiretinal membrane (ERM) of left eye 10/05/2017   Pseudophakia of both eyes 10/05/2017   Osteopenia 03/19/2017   Mixed hyperlipidemia 08/18/2016   Generalized osteoarthritis of multiple sites 08/18/2016   Peripheral neuropathic pain 08/18/2016   Overactive bladder 08/18/2016   Glaucoma 08/18/2016   Cervical disc disorder with radiculopathy of cervical region 08/17/2016   Carpal tunnel syndrome, right upper limb 08/17/2016   Abnormal finding on mammography 06/14/2016   Breast lump 05/06/2015    PCP: Yolande Toribio MATSU, MD  REFERRING PROVIDER:  Yolande Toribio MATSU, MD   Delane Lye, MD   REFERRING DIAG:  R26.81 (ICD-10-CM) - Unsteadiness on feet/Unsteady gait   M25.511 (ICD-10-CM) - Pain in right shoulder   THERAPY DIAG:  Other abnormalities of gait and mobility  Unsteadiness on feet  Muscle weakness (generalized)  Right shoulder pain, unspecified chronicity  Stiffness of right shoulder, not elsewhere classified  Rationale for Evaluation and Treatment: Rehabilitation  ONSET DATE: 2021 ; PT order 04/04/2024  SUBJECTIVE:   SUBJECTIVE STATEMENT: Pt states she is having difficulty with reaching out (in an ER position).  Her shoulder pain hurts when she gets up in the  AM and when she goes to bed at night.  Sometimes, it's hard for her to sleep.  Pt continues to have difficulty with reaching a shelf overhead.  Pt reports a 30% improvement in daily usage of R UE.  Pt continues to have difficulty dressing.    Pt denies any adverse effects after prior treatment.  Pt performed home exercises this AM.      PERTINENT HISTORY: Pt uses a SPC with walking.  Flexor tenotomy on 3 toes of R foot on 05/01/24--Pt reports she has no restrictions from MD. Arthritis, osteopenia, peripheral neuropathy Bilat TKR  PAIN:  R shoulder:  1-2/10    PRECAUTIONS: Fall and Other: peripheral neuropathy   WEIGHT BEARING RESTRICTIONS: No  FALLS:  Has patient fallen in last 6 months? No  LIVING ENVIRONMENT: Lives with: lives alone Lives in:  1 story home Stairs: No Has following equipment at home:  Single point cane, shower chair, and Grab bars  OCCUPATION:  Retired  PLOF: Independent  PATIENT GOALS: improved ability to pivot.  Improved walking on uneven terrain and performing stairs   OBJECTIVE:  Note: Objective measures were completed at Evaluation unless otherwise noted.  DIAGNOSTIC FINDINGS: Pt reports x rays were negative for fracture.   TREATMENT DATE:   11/18/24  TUG:  Initial/Prior/Current:  17.84 sec with SPC  / 13.52 sec with SPC  /  15.23 with SPC  Initial Shoulder AROM: Flexion:  R:  114   L:  136 Sc[place]ion: R: 116  L:  122 ER:  R: 77      L:  75 IR:    L:   70        L:  67   Elbow AROM: R: lacking 12/5 deg AROM/PROM - 146 deg L:  0 - 156 deg  11/04/24 R elbow extension AROM:  3 deg R shoulder flexion AROM:  147 deg L shoulder flexion AROM:  150 deg   Current Shoulder AROM (12/15): Flexion 135 ER:  60/68 AROM/PROM  LOWER EXTREMITY MMT:   MMT Right eval Left eval Right 7/24 Left 7/24 Right 9/4 Left 9/4 Right 12/15 Left 12/15  Hip flexion 4/5 4/5 4/5 4/5 4/5 4/5 5/5 4+/5  Hip extension                Hip abduction 24.6 19.1  25.4 25.8 23.8 22.8 21.6 22.1  Hip adduction                Hip internal rotation                Hip external rotation                Knee flexion 5/5 seated 5/5 seated     5/5 seated 5/5 seated    Knee extension 4-/5 4-/5 4/5 4/5 4+/5 5/5 4/5 5/5  Ankle dorsiflexion Tol min resistance 4/5 Tol min resistance 4+/5 Tol min resistance 4+/5 Tol min resistance 4+/5 w/n available range  Ankle plantarflexion                Ankle inversion                Ankle eversion                 (Blank rows = not tested)   UEFI: INITIAL/CURRENT:  40/80 /  36/80     5x STS test:  Initial / Prior / Current:  15.35 without UE support / 16.17 sec without UE support / 13.43 without UE support   Pulleys in flexion and scaption x 20 each Supine shoulder ABC x 1 reps Seated wand ER 2x10 Standing DF one foot at a time 2x10  PATIENT EDUCATION:  Education details:  HEP, rationale of interventions, objective findings, progress/deficits, POC, dx, exercise form, and relevant anatomy.  PT answered pt's questions.   Person educated: Patient Education method: Explanation, demonstration, verbal cues Education comprehension: verbalized understanding and needs further education, verbal cues required, returned demonstration  HOME EXERCISE PROGRAM:   Access Code: [email or web address] URL: [email or web address] Date: 09/17/2024 Prepared by: Mose Minerva     ASSESSMENT:  CLINICAL IMPRESSION:  Pt reports continued R shoulder pain and difficulty with ADLs/IADL's involving R UE.  Pt continues to have gait and balance deficits.  She has decreased foot clearance with gait and has decreased DF AROM.  PT has been working on balance, gait, stability, LE strength, and R shoulder ROM.  Pt has been making slow progress with balance.  She ambulates with a SPC and rollator.  She requires an AD for  improved stability with gait and decreased fall risk.  Pt reports a 30% improvement in daily usage of R UE.  Pt reports difficulty with reaching a shelf overhead and dressing.  Pt's TUG is worse than prior testing though better than initial eval.  Pt demonstrates improved bilat hip flexion strength and worse R knee extension strength.  Pt has continued weakness in hip abduction.  Pt's R shoulder ER AROM is worse than initial eval.  Pt demonstrates worse self perceived disability with UEFI though not clinically significant.  Pt has made minimal progress toward goals recently.  She should benefit from cont skilled PT services to improve balance, stability, functional mobility, shoulder ROM, LE strength, ambulation, and minimize fall risk.     OBJECTIVE IMPAIRMENTS: Abnormal gait, decreased activity tolerance, decreased balance, decreased endurance, decreased mobility, difficulty walking, and decreased strength.   ACTIVITY LIMITATIONS: standing, squatting, stairs, transfers, and locomotion level  PARTICIPATION LIMITATIONS: cleaning and community activity  PERSONAL FACTORS: 3+ comorbidities: Arthritis, bilat TKA, peripheral neuropathy are also affecting patient's functional outcome.   REHAB POTENTIAL: Good  CLINICAL DECISION MAKING: Stable/uncomplicated  EVALUATION COMPLEXITY: Low   GOALS:   SHORT TERM GOALS: Target date:  06/05/2024   Pt will be independent and compliant with HEP for improved strength, balance, stability, and mobility.  Baseline: Goal status: GOAL MET 06/27/24  2.  Pt will demo improved time on TUG test by at least 5 seconds for improved mobility and reduced fall risk.   Baseline:  Goal status: ONGOING  12/15  3.  Pt will report at least a 25% improvement in her balance.   Baseline:  Goal status: GOAL MET  06/27/24  4.  Pt will report at least a 25% improvement in daily usage of R UE without adverse effects.  Goal Status:  GOAL MET  11/04/24 Target date: 10/09/2024  5.   Pt will demo at least a 12-15 deg increase in R shoulder flexion AROM and a 5-7 deg increase in R elbow extension AROM for improved performance of reaching and performance of daily activities.  Goal Status:  GOAL MET  11/04/24 Target date: 10/16/2024      LONG TERM GOALS: Target date:  12/30/2024   Pt will score at least 44/56 on the Berg Balance Assessment for improved balance and stability with daily tasks and mobility.  Baseline:  35/56 initial,  40/56 on 11/6 Goal status: NOT MET   2.  Pt will be able to load and unload her dishwasher with good stability and good confidence.  Baseline:  Goal status: GOAL MET  with holding onto counter  9/4  3.  Pt  will demo improved bilat LE strength to 4+/5 in hip flexion, 5/5 in knee extension, 4-/5 in R ankle DF, and a 5# increase in L hip abduction for improved performance of functional mobility and performance of household chores.  Baseline:  Goal status:  ONGOING  4.  Pt will be able to ascend and descend stairs with a reciprocal gait with the rail.   Baseline:  Goal status: PROGRESSING  9/4  5.  Pt will report she is able to perform her normal ambulation with cane with good stability and confidence and without LOB. Baseline:  Goal status:ONGOING  6.  Pt will report she is able to perform her normal reaching activities including reaching into an overhead cabinet without significant difficulty and pain.   Goal status:  NOT MET  7.  Pt will be able to dress without difficulty.   Goal status:  NOT MET    8.  Pt will be able to perform her ADLs without significant shoulder pain and difficulty.   Goal status:  ONGOING  9.  Pt will report at least a 50% improvement in balance with turning her head and also closing heavy doors.   Goal status:  ONGOING   PLAN:  PT FREQUENCY: 2x/wk  PT DURATION: 6 weeks  PLANNED INTERVENTIONS: 02835- PT Re-evaluation, 97750- Physical Performance Testing, 97110-Therapeutic exercises, 97530- Therapeutic  activity, [id number]- Neuromuscular re-education, 97535- Self Care, 02859- Manual therapy, [phone]- Gait training, [phone]- Aquatic Therapy, [phone]- Electrical stimulation (unattended), Patient/Family education, Balance training, Stair training, Taping, DME instructions, Cryotherapy, and Moist heat  PLAN FOR NEXT SESSION: Work on [employer].  LE strengthening, gait, and balance activities.  Work on step ups and step downs.  Cont with shoulder ROM.    Leigh Minerva III PT, DPT 11/19/2024 9:32 PM

## 2024-11-18 ENCOUNTER — Ambulatory Visit (HOSPITAL_BASED_OUTPATIENT_CLINIC_OR_DEPARTMENT_OTHER): Admitting: Physical Therapy

## 2024-11-18 ENCOUNTER — Encounter (HOSPITAL_BASED_OUTPATIENT_CLINIC_OR_DEPARTMENT_OTHER): Payer: Self-pay | Admitting: Physical Therapy

## 2024-11-18 DIAGNOSIS — M6281 Muscle weakness (generalized): Secondary | ICD-10-CM

## 2024-11-18 DIAGNOSIS — R2689 Other abnormalities of gait and mobility: Secondary | ICD-10-CM

## 2024-11-18 DIAGNOSIS — R2681 Unsteadiness on feet: Secondary | ICD-10-CM

## 2024-11-18 DIAGNOSIS — M25611 Stiffness of right shoulder, not elsewhere classified: Secondary | ICD-10-CM

## 2024-11-18 DIAGNOSIS — M25511 Pain in right shoulder: Secondary | ICD-10-CM

## 2024-11-20 ENCOUNTER — Encounter (HOSPITAL_BASED_OUTPATIENT_CLINIC_OR_DEPARTMENT_OTHER): Payer: Self-pay | Admitting: Physical Therapy

## 2024-11-20 ENCOUNTER — Ambulatory Visit (HOSPITAL_BASED_OUTPATIENT_CLINIC_OR_DEPARTMENT_OTHER): Admitting: Physical Therapy

## 2024-11-20 DIAGNOSIS — R2689 Other abnormalities of gait and mobility: Secondary | ICD-10-CM

## 2024-11-20 DIAGNOSIS — R2681 Unsteadiness on feet: Secondary | ICD-10-CM

## 2024-11-20 DIAGNOSIS — M25511 Pain in right shoulder: Secondary | ICD-10-CM

## 2024-11-20 DIAGNOSIS — M6281 Muscle weakness (generalized): Secondary | ICD-10-CM

## 2024-11-20 DIAGNOSIS — M25611 Stiffness of right shoulder, not elsewhere classified: Secondary | ICD-10-CM

## 2024-11-20 NOTE — Therapy (Signed)
 OUTPATIENT PHYSICAL THERAPY TREATMENT           Patient Name: Hailey Perkins MRN: [id number] DOB:19-Jan-1935, 89 y.o., female Today's Date: 11/21/2024  END OF SESSION:  PT End of Session - 11/20/24 1432     Visit Number 35    Number of Visits 46    Date for Recertification  12/30/24    Authorization Type [employer] MCR    PT Start Time 1410    PT Stop Time 1459    PT Time Calculation (min) 49 min    Activity Tolerance Patient tolerated treatment well    Behavior During Therapy WFL for tasks assessed/performed                 Past Medical History:  Diagnosis Date   Aortic atherosclerosis    Arthritis    Cataract 2010   bilateral eyes   Chicken pox    Cholelithiasis    Diverticulitis    Diverticulosis    GERD (gastroesophageal reflux disease)    Glaucoma    Hiatal hernia    History of frequent urinary tract infections    Hyperlipidemia    Osteopenia    Peripheral neuropathy    Sleep apnea    wears a CPAP   Urine incontinence    Past Surgical History:  Procedure Laterality Date   ABDOMINAL HYSTERECTOMY  1977   ANAL FISTULECTOMY  1985   ANKLE FRACTURE SURGERY Right 2014   APPENDECTOMY     BIOPSY  02/15/2021   Procedure: BIOPSY;  Surgeon: Wilhelmenia Aloha Raddle., MD;  Location: [facility];  Service: Gastroenterology;;   BREAST BIOPSY Bilateral 10+ yrs ago   BENIGN   BUNIONECTOMY Left 1975   Left Toe   CATARACT EXTRACTION, BILATERAL  2009   COLONOSCOPY WITH PROPOFOL  N/A 02/15/2021   Procedure: COLONOSCOPY WITH PROPOFOL ;  Surgeon: Wilhelmenia Aloha Raddle., MD;  Location: [facility];  Service: Gastroenterology;  Laterality: N/A;  ultraslimscope    DILATION AND CURETTAGE OF UTERUS  1972   eye PRK vision correction Left 10/09/2013   eye socket re-formed Left 2014   FOOT NEUROMA SURGERY Right 2003   plus bunionectomy, right great toe   HAMMER TOE SURGERY Right 2005   Right foot correction hammertoe little toe   HEMORRHOID SURGERY  1967    HIP SURGERY Right 2014   right hip femur pin implanted   KNEE ARTHROSCOPY Left 2010   debridement left knee scar tissue   LAPAROSCOPIC OOPHERECTOMY     1986, 1990   LASIK  1998   NEUROMA SURGERY Right 2003   Right Foot Neuroma, plus Bunionectomy, Right Great Toe   POLYPECTOMY  02/15/2021   Procedure: POLYPECTOMY;  Surgeon: Wilhelmenia Aloha Raddle., MD;  Location: [facility];  Service: Gastroenterology;;   RECTOCELE REPAIR  1968   REPLACEMENT TOTAL KNEE Left 01/2008   REPLACEMENT TOTAL KNEE Right 08/2008   retinal pucker correction Left 07/14/2012   schwannoma tumor (benign) removal  2011   right rib   SIGMOIDOSCOPY     TONSILLECTOMY AND ADENOIDECTOMY     VENTRAL HERNIA REPAIR  1993   Patient Active Problem List   Diagnosis Date Noted   Precordial chest pain 10/14/2024   Arthralgia of right elbow 08/24/2022   Bilateral wrist pain 08/24/2022   Neck pain 08/24/2022   Pain of right hip joint 08/24/2022   Chronic pain of right knee 05/27/2021   OSA on CPAP 05/27/2021   LLQ pain 12/16/2020  History of diverticulitis 12/16/2020   Hematochezia 12/16/2020   Abnormal colonoscopy 12/16/2020   Acquired trigger finger of right index finger 10/26/2020   Other specified arthritis, right hand 10/26/2020   Diverticulitis of colon 08/15/2020   Dry eyes, bilateral 10/10/2019   Sleep-related hypoxia 08/05/2019   OSA (obstructive sleep apnea) 08/05/2019   PLMD (periodic limb movement disorder) 08/05/2019   Cerebellar 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) 05/06/2019   Excessive daytime sleepiness 05/06/2019   Loud snoring 05/06/2019   Ventral hernia without obstruction or gangrene 02/22/2019   Chronic throat clearing 01/18/2019   Nocturnal cough 01/18/2019   Family history of colon cancer in father 01/18/2019   Diverticulosis 01/18/2019   Gait abnormality 12/27/2018   Numbness 12/27/2018   Status post total bilateral knee replacement 11/30/2018   DDD (degenerative disc  disease), cervical 11/30/2018   Status post carpal tunnel release 11/30/2018   DDD (degenerative disc disease), lumbar 10/26/2018   Primary osteoarthritis of both hands 10/26/2018   Primary osteoarthritis of both feet 10/26/2018   Gastroesophageal reflux disease 08/27/2018   Bilateral impacted cerumen 08/21/2018   Sensorineural hearing loss (SNHL) of both ears 08/21/2018   Tinnitus, bilateral 08/21/2018   Blood in urine 04/17/2018   Secondary open-angle glaucoma of left eye, moderate stage 03/10/2018   Early stage 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 10/05/2017   Epiretinal membrane (ERM) of left eye 10/05/2017   Pseudophakia of both eyes 10/05/2017   Osteopenia 03/19/2017   Mixed hyperlipidemia 08/18/2016   Generalized osteoarthritis of multiple sites 08/18/2016   Peripheral neuropathic pain 08/18/2016   Overactive bladder 08/18/2016   Glaucoma 08/18/2016   Cervical disc disorder with radiculopathy of cervical region 08/17/2016   Carpal tunnel syndrome, right upper limb 08/17/2016   Abnormal finding on mammography 06/14/2016   Breast lump 05/06/2015    PCP: Yolande Toribio MATSU, MD  REFERRING PROVIDER:  Yolande Toribio MATSU, MD   Delane Lye, MD   REFERRING DIAG:  R26.81 (ICD-10-CM) - Unsteadiness on feet/Unsteady gait   M25.511 (ICD-10-CM) - Pain in right shoulder   THERAPY DIAG:  Other abnormalities of gait and mobility  Unsteadiness on feet  Muscle weakness (generalized)  Right shoulder pain, unspecified chronicity  Stiffness of right shoulder, not elsewhere classified  Rationale for Evaluation and Treatment: Rehabilitation  ONSET DATE: 2021 ; PT order 04/04/2024  SUBJECTIVE:   SUBJECTIVE STATEMENT: Pt states she still has pain in shoulder.  She denies any adverse effects after prior treatment.  Pt saw chiropractor yesterday and had an adjustment yesterday.  Pt is going to start seeing the chiropractor.  Pt is having difficulty with  reaching out (in an ER position).  Her shoulder pain hurts when she gets up in the AM and when she goes to bed at night.  Sometimes, it's hard for her to sleep.  Pt continues to have difficulty with reaching a shelf overhead.      PERTINENT HISTORY: Pt uses a SPC with walking.  Flexor tenotomy on 3 toes of R foot on 05/01/24--Pt reports she has no restrictions from MD. Arthritis, osteopenia, peripheral neuropathy Bilat TKR  PAIN:  R shoulder:  1-2/10    PRECAUTIONS: Fall and Other: peripheral neuropathy   WEIGHT BEARING RESTRICTIONS: No  FALLS:  Has patient fallen in last 6 months? No  LIVING ENVIRONMENT: Lives with: lives alone Lives in:  1 story home Stairs: No Has following equipment at home: Single point cane, shower chair, and Grab bars  OCCUPATION:  Retired  PLOF: Independent  PATIENT GOALS: improved ability to pivot.  Improved walking on uneven terrain and performing stairs   OBJECTIVE:  Note: Objective measures were completed at Evaluation unless otherwise noted.  DIAGNOSTIC FINDINGS: Pt reports x rays were negative for fracture.   TREATMENT DATE:   11/20/24  Pulleys in flexion and scaption x 20 each Standing ladder walks in flexion x 10 reps Shelf reach x 10 Seated wand ER 3x10 Sit to stands from chair x 10, with RTB around LE's 2x8-10 Half tandem stance 2x30 sec bilat with UE support as needed and min assist on 2 occasions for LOB Sidestepping on airex beam with UE support with SBA Tandem gait on airex beam with UE support with SBA  Pt received R shoulder flexion and ER PROM in supine per pt and tissue tolerance. Updated HEP and gave pt a HEP handout.  PT educated pt in correct form and appropriate frequency.                                                                                                                                PATIENT EDUCATION:  Education details:  HEP, rationale of interventions, POC, dx, exercise form, and relevant  anatomy.  PT answered pt's questions.   Person educated: Patient Education method: Explanation, demonstration, verbal cues Education comprehension: verbalized understanding and needs further education, verbal cues required, returned demonstration  HOME EXERCISE PROGRAM:   Access Code: [email or web address] URL: [email or web address] Date: 09/17/2024 Prepared by: Mose Minerva  Updated HEP: - Seated Shoulder External Rotation AAROM with Dowel  - 1-2 x daily - 7 x weekly - 2 sets - 10 reps    ASSESSMENT:  CLINICAL IMPRESSION:  Pt reports having R shoulder pain and difficulty with ADLs/IADL's involving R UE.  Pt performed exercises to improve shoulder ROM, functional reaching, balance, gait, stability, and functional LE strength.  PT performed PROM to improve functional shoulder ROM.  Pt has been making slow progress with balance.  She presented to treatment with SPC.  Pt didn't lose balance with sit to stands without theraband though did take her multiple attempts on reps occasionally.  PT decreased theraband resistance.  She had some LOB which she self corrected and had decreased eccentric control when lowering to chair when performing with theraband.  Pt requires UE support with airex beam exercises.  Pt gave great effort with exercises and tolerated treatment well.  She should benefit from cont skilled PT services to improve balance, stability, functional mobility, shoulder ROM, LE strength, ambulation, and minimize fall risk.       OBJECTIVE IMPAIRMENTS: Abnormal gait, decreased activity tolerance, decreased balance, decreased endurance, decreased mobility, difficulty walking, and decreased strength.   ACTIVITY LIMITATIONS: standing, squatting, stairs, transfers, and locomotion level  PARTICIPATION LIMITATIONS: cleaning and community activity  PERSONAL FACTORS: 3+ comorbidities: Arthritis, bilat TKA, peripheral neuropathy are also affecting patient's functional outcome.   REHAB  POTENTIAL: Good  CLINICAL DECISION MAKING: Stable/uncomplicated  EVALUATION COMPLEXITY: Low   GOALS:   SHORT TERM GOALS: Target date:  06/05/2024   Pt will be independent and compliant with HEP for improved strength, balance, stability, and mobility.  Baseline: Goal status: GOAL MET 06/27/24  2.  Pt will demo improved time on TUG test by at least 5 seconds for improved mobility and reduced fall risk.   Baseline:  Goal status: ONGOING  12/15  3.  Pt will report at least a 25% improvement in her balance.   Baseline:  Goal status: GOAL MET  06/27/24  4.  Pt will report at least a 25% improvement in daily usage of R UE without adverse effects.  Goal Status:  GOAL MET  11/04/24 Target date: 10/09/2024  5.  Pt will demo at least a 12-15 deg increase in R shoulder flexion AROM and a 5-7 deg increase in R elbow extension AROM for improved performance of reaching and performance of daily activities.  Goal Status:  GOAL MET  11/04/24 Target date: 10/16/2024      LONG TERM GOALS: Target date:  12/30/2024   Pt will score at least 44/56 on the Berg Balance Assessment for improved balance and stability with daily tasks and mobility.  Baseline:  35/56 initial,  40/56 on 11/6 Goal status: NOT MET   2.  Pt will be able to load and unload her dishwasher with good stability and good confidence.  Baseline:  Goal status: GOAL MET  with holding onto counter  9/4  3.  Pt will demo improved bilat LE strength to 4+/5 in hip flexion, 5/5 in knee extension, 4-/5 in R ankle DF, and a 5# increase in L hip abduction for improved performance of functional mobility and performance of household chores.  Baseline:  Goal status:  ONGOING  4.  Pt will be able to ascend and descend stairs with a reciprocal gait with the rail.   Baseline:  Goal status: PROGRESSING  9/4  5.  Pt will report she is able to perform her normal ambulation with cane with good stability and confidence and without LOB. Baseline:   Goal status:ONGOING  6.  Pt will report she is able to perform her normal reaching activities including reaching into an overhead cabinet without significant difficulty and pain.   Goal status:  NOT MET  7.  Pt will be able to dress without difficulty.   Goal status:  NOT MET    8.  Pt will be able to perform her ADLs without significant shoulder pain and difficulty.   Goal status:  ONGOING  9.  Pt will report at least a 50% improvement in balance with turning her head and also closing heavy doors.   Goal status:  ONGOING   PLAN:  PT FREQUENCY: 2x/wk  PT DURATION: 6 weeks  PLANNED INTERVENTIONS: 02835- PT Re-evaluation, 97750- Physical Performance Testing, 97110-Therapeutic exercises, 97530- Therapeutic activity, [id number]- Neuromuscular re-education, 97535- Self Care, [id number]- Manual therapy, [phone]- Gait training, [phone]- Aquatic Therapy, [phone]- Electrical stimulation (unattended), Patient/Family education, Balance training, Stair training, Taping, DME instructions, Cryotherapy, and Moist heat  PLAN FOR NEXT SESSION: Work on [employer].  LE strengthening, gait, and balance activities.  Work on step ups and step downs.  Cont with shoulder ROM.    Leigh Minerva III PT, DPT 11/21/2024 4:06 PM

## 2024-12-02 ENCOUNTER — Ambulatory Visit (HOSPITAL_BASED_OUTPATIENT_CLINIC_OR_DEPARTMENT_OTHER): Admitting: Physical Therapy

## 2024-12-02 ENCOUNTER — Encounter (HOSPITAL_BASED_OUTPATIENT_CLINIC_OR_DEPARTMENT_OTHER): Payer: Self-pay | Admitting: Physical Therapy

## 2024-12-02 DIAGNOSIS — M25511 Pain in right shoulder: Secondary | ICD-10-CM

## 2024-12-02 DIAGNOSIS — M25611 Stiffness of right shoulder, not elsewhere classified: Secondary | ICD-10-CM

## 2024-12-02 DIAGNOSIS — R2681 Unsteadiness on feet: Secondary | ICD-10-CM

## 2024-12-02 DIAGNOSIS — M6281 Muscle weakness (generalized): Secondary | ICD-10-CM

## 2024-12-02 DIAGNOSIS — R2689 Other abnormalities of gait and mobility: Secondary | ICD-10-CM

## 2024-12-02 NOTE — Therapy (Signed)
 "      OUTPATIENT PHYSICAL THERAPY TREATMENT           Patient Name: Hailey Perkins MRN: [id number] DOB:Mar 07, 1935, 89 y.o., female Today's Date: 12/03/2024  END OF SESSION:  PT End of Session - 12/02/24 1329     Visit Number 36    Number of Visits 46    Date for Recertification  12/30/24    Authorization Type BCBS MCR    PT Start Time 1324    PT Stop Time 1404    PT Time Calculation (min) 40 min    Activity Tolerance Patient tolerated treatment well    Behavior During Therapy WFL for tasks assessed/performed                 Past Medical History:  Diagnosis Date   Aortic atherosclerosis    Arthritis    Cataract 2010   bilateral eyes   Chicken pox    Cholelithiasis    Diverticulitis    Diverticulosis    GERD (gastroesophageal reflux disease)    Glaucoma    Hiatal hernia    History of frequent urinary tract infections    Hyperlipidemia    Osteopenia    Peripheral neuropathy    Sleep apnea    wears a CPAP   Urine incontinence    Past Surgical History:  Procedure Laterality Date   ABDOMINAL HYSTERECTOMY  1977   ANAL FISTULECTOMY  1985   ANKLE FRACTURE SURGERY Right 2014   APPENDECTOMY     BIOPSY  02/15/2021   Procedure: BIOPSY;  Surgeon: Wilhelmenia Aloha Raddle., MD;  Location: [facility];  Service: Gastroenterology;;   BREAST BIOPSY Bilateral 10+ yrs ago   BENIGN   BUNIONECTOMY Left 1975   Left Toe   CATARACT EXTRACTION, BILATERAL  2009   COLONOSCOPY WITH PROPOFOL  N/A 02/15/2021   Procedure: COLONOSCOPY WITH PROPOFOL ;  Surgeon: Wilhelmenia Aloha Raddle., MD;  Location: [facility];  Service: Gastroenterology;  Laterality: N/A;  ultraslimscope    DILATION AND CURETTAGE OF UTERUS  1972   eye PRK vision correction Left 10/09/2013   eye socket re-formed Left 2014   FOOT NEUROMA SURGERY Right 2003   plus bunionectomy, right great toe   HAMMER TOE SURGERY Right 2005   Right foot correction hammertoe little toe   HEMORRHOID SURGERY  1967    HIP SURGERY Right 2014   right hip femur pin implanted   KNEE ARTHROSCOPY Left 2010   debridement left knee scar tissue   LAPAROSCOPIC OOPHERECTOMY     1986, 1990   LASIK  1998   NEUROMA SURGERY Right 2003   Right Foot Neuroma, plus Bunionectomy, Right Great Toe   POLYPECTOMY  02/15/2021   Procedure: POLYPECTOMY;  Surgeon: Wilhelmenia Aloha Raddle., MD;  Location: [facility];  Service: Gastroenterology;;   RECTOCELE REPAIR  1968   REPLACEMENT TOTAL KNEE Left 01/2008   REPLACEMENT TOTAL KNEE Right 08/2008   retinal pucker correction Left 07/14/2012   schwannoma tumor (benign) removal  2011   right rib   SIGMOIDOSCOPY     TONSILLECTOMY AND ADENOIDECTOMY     VENTRAL HERNIA REPAIR  1993   Patient Active Problem List   Diagnosis Date Noted   Precordial chest pain 10/14/2024   Arthralgia of right elbow 08/24/2022   Bilateral wrist pain 08/24/2022   Neck pain 08/24/2022   Pain of right hip joint 08/24/2022   Chronic pain of right knee 05/27/2021   OSA on CPAP 05/27/2021   LLQ pain 12/16/2020  History of diverticulitis 12/16/2020   Hematochezia 12/16/2020   Abnormal colonoscopy 12/16/2020   Acquired trigger finger of right index finger 10/26/2020   Other specified arthritis, right hand 10/26/2020   Diverticulitis of colon 08/15/2020   Dry eyes, bilateral 10/10/2019   Sleep-related hypoxia 08/05/2019   OSA (obstructive sleep apnea) 08/05/2019   PLMD (periodic limb movement disorder) 08/05/2019   Cerebellar 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) 05/06/2019   Excessive daytime sleepiness 05/06/2019   Loud snoring 05/06/2019   Ventral hernia without obstruction or gangrene 02/22/2019   Chronic throat clearing 01/18/2019   Nocturnal cough 01/18/2019   Family history of colon cancer in father 01/18/2019   Diverticulosis 01/18/2019   Gait abnormality 12/27/2018   Numbness 12/27/2018   Status post total bilateral knee replacement 11/30/2018   DDD (degenerative disc  disease), cervical 11/30/2018   Status post carpal tunnel release 11/30/2018   DDD (degenerative disc disease), lumbar 10/26/2018   Primary osteoarthritis of both hands 10/26/2018   Primary osteoarthritis of both feet 10/26/2018   Gastroesophageal reflux disease 08/27/2018   Bilateral impacted cerumen 08/21/2018   Sensorineural hearing loss (SNHL) of both ears 08/21/2018   Tinnitus, bilateral 08/21/2018   Blood in urine 04/17/2018   Secondary open-angle glaucoma of left eye, moderate stage 03/10/2018   Early stage 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 10/05/2017   Epiretinal membrane (ERM) of left eye 10/05/2017   Pseudophakia of both eyes 10/05/2017   Osteopenia 03/19/2017   Mixed hyperlipidemia 08/18/2016   Generalized osteoarthritis of multiple sites 08/18/2016   Peripheral neuropathic pain 08/18/2016   Overactive bladder 08/18/2016   Glaucoma 08/18/2016   Cervical disc disorder with radiculopathy of cervical region 08/17/2016   Carpal tunnel syndrome, right upper limb 08/17/2016   Abnormal finding on mammography 06/14/2016   Breast lump 05/06/2015    PCP: Yolande Toribio MATSU, MD  REFERRING PROVIDER:  Yolande Toribio MATSU, MD   Delane Lye, MD   REFERRING DIAG:  R26.81 (ICD-10-CM) - Unsteadiness on feet/Unsteady gait   M25.511 (ICD-10-CM) - Pain in right shoulder   THERAPY DIAG:  Other abnormalities of gait and mobility  Unsteadiness on feet  Muscle weakness (generalized)  Right shoulder pain, unspecified chronicity  Stiffness of right shoulder, not elsewhere classified  Rationale for Evaluation and Treatment: Rehabilitation  ONSET DATE: 2021 ; PT order 04/04/2024  SUBJECTIVE:   SUBJECTIVE STATEMENT: Pt states her shoulder bothered her more when she was with family over the holidays.  She had difficulty sleeping.  Pt had bilat shoulder pain which she thinks being on the computer may have contributed to her pain.  She states she was ok  after prior treatment.  Pt has been using diclofenac gel on both shoulders which helps.  Pt is seeing the chiropractor later today.        PERTINENT HISTORY: Pt uses a SPC with walking.  Flexor tenotomy on 3 toes of R foot on 05/01/24--Pt reports she has no restrictions from MD. Arthritis, osteopenia, peripheral neuropathy Bilat TKR  PAIN:  R shoulder:  4/10    PRECAUTIONS: Fall and Other: peripheral neuropathy   WEIGHT BEARING RESTRICTIONS: No  FALLS:  Has patient fallen in last 6 months? No  LIVING ENVIRONMENT: Lives with: lives alone Lives in:  1 story home Stairs: No Has following equipment at home: Single point cane, shower chair, and Grab bars  OCCUPATION:  Retired  PLOF: Independent  PATIENT GOALS: improved ability to pivot.  Improved walking on uneven terrain and performing  stairs   OBJECTIVE:  Note: Objective measures were completed at Evaluation unless otherwise noted.  DIAGNOSTIC FINDINGS: Pt reports x rays were negative for fracture.   TREATMENT DATE:   12/02/24  Pulleys in flexion and scaption x 20 each Standing ladder walks in flexion x 9 reps Supine shoulder ABC x 1 rep Supine wand ER 2x10 Shelf reach x 10 Sidestepping on airex beam with UE support with SBA Tandem gait on airex beam with UE support with SBA/CGA Reciprocal stepping over 3 hurdles with UE support on rail Standing DF one LE at a time x 10 each LE Step ups and step downs on a 6 inch step with 1 UE support x 10 reps each                                                                 PATIENT EDUCATION:  Education details:  HEP, rationale of interventions, POC, dx, exercise form, and relevant anatomy.  PT answered pt's questions.   Person educated: Patient Education method: Explanation, demonstration, verbal cues Education comprehension: verbalized understanding and needs further education, verbal cues required, returned demonstration  HOME EXERCISE PROGRAM:   Access Code:  [email or web address] URL: [email or web address] Date: 09/17/2024 Prepared by: Mose Minerva    ASSESSMENT:  CLINICAL IMPRESSION:  Pt performed exercises to improve shoulder ROM, functional reaching, gait, stability, balance, and stair/curb performance.  Pt has continued R shoulder pain and reports having bilat shoulder pain recently.  Pt requires UE support with airex beam exercises. Pt did well with reciprocal stepping over hurdles though requires UE support on rail.  Pt requires instruction in correct positioning for ladder walks in flexion.  She tolerated treatment well and reports minimal fatigue and no increased pain after treatment.  She should benefit from cont skilled PT services to improve balance, stability, functional mobility, shoulder ROM, LE strength, ambulation, and minimize fall risk.        OBJECTIVE IMPAIRMENTS: Abnormal gait, decreased activity tolerance, decreased balance, decreased endurance, decreased mobility, difficulty walking, and decreased strength.   ACTIVITY LIMITATIONS: standing, squatting, stairs, transfers, and locomotion level  PARTICIPATION LIMITATIONS: cleaning and community activity  PERSONAL FACTORS: 3+ comorbidities: Arthritis, bilat TKA, peripheral neuropathy are also affecting patient's functional outcome.   REHAB POTENTIAL: Good  CLINICAL DECISION MAKING: Stable/uncomplicated  EVALUATION COMPLEXITY: Low   GOALS:   SHORT TERM GOALS: Target date:  06/05/2024   Pt will be independent and compliant with HEP for improved strength, balance, stability, and mobility.  Baseline: Goal status: GOAL MET 06/27/24  2.  Pt will demo improved time on TUG test by at least 5 seconds for improved mobility and reduced fall risk.   Baseline:  Goal status: ONGOING  12/15  3.  Pt will report at least a 25% improvement in her balance.   Baseline:  Goal status: GOAL MET  06/27/24  4.  Pt will report at least a 25% improvement in daily usage of R UE  without adverse effects.  Goal Status:  GOAL MET  11/04/24 Target date: 10/09/2024  5.  Pt will demo at least a 12-15 deg increase in R shoulder flexion AROM and a 5-7 deg increase in R elbow extension AROM for improved performance of reaching and performance of daily activities.  Goal Status:  GOAL  MET  11/04/24 Target date: 10/16/2024      LONG TERM GOALS: Target date:  12/30/2024   Pt will score at least 44/56 on the [place] Balance Assessment for improved balance and stability with daily tasks and mobility.  Baseline:  35/56 initial,  40/56 on 11/6 Goal status: NOT MET   2.  Pt will be able to load and unload her dishwasher with good stability and good confidence.  Baseline:  Goal status: GOAL MET  with holding onto counter  9/4  3.  Pt will demo improved bilat LE strength to 4+/5 in hip flexion, 5/5 in knee extension, 4-/5 in R ankle DF, and a 5# increase in L hip abduction for improved performance of functional mobility and performance of household chores.  Baseline:  Goal status:  ONGOING  4.  Pt will be able to ascend and descend stairs with a reciprocal gait with the rail.   Baseline:  Goal status: PROGRESSING  9/4  5.  Pt will report she is able to perform her normal ambulation with cane with good stability and confidence and without LOB. Baseline:  Goal status:ONGOING  6.  Pt will report she is able to perform her normal reaching activities including reaching into an overhead cabinet without significant difficulty and pain.   Goal status:  NOT MET  7.  Pt will be able to dress without difficulty.   Goal status:  NOT MET    8.  Pt will be able to perform her ADLs without significant shoulder pain and difficulty.   Goal status:  ONGOING  9.  Pt will report at least a 50% improvement in balance with turning her head and also closing heavy doors.   Goal status:  ONGOING   PLAN:  PT FREQUENCY: 2x/wk  PT DURATION: 6 weeks  PLANNED INTERVENTIONS: 02835- PT  Re-evaluation, 97750- Physical Performance Testing, 97110-Therapeutic exercises, 97530- Therapeutic activity, [id number]- Neuromuscular re-education, 97535- Self Care, 02859- Manual therapy, [phone]- Gait training, [phone]- Aquatic Therapy, [phone]- Electrical stimulation (unattended), Patient/Family education, Balance training, Stair training, Taping, DME instructions, Cryotherapy, and Moist heat  PLAN FOR NEXT SESSION: Work on [employer].  LE strengthening, gait, and balance activities.  Work on step ups and step downs.  Cont with shoulder ROM.    Leigh Minerva III PT, DPT 12/03/2024 11:07 PM                                                "

## 2024-12-04 ENCOUNTER — Ambulatory Visit (HOSPITAL_BASED_OUTPATIENT_CLINIC_OR_DEPARTMENT_OTHER): Admitting: Physical Therapy

## 2024-12-04 ENCOUNTER — Encounter (HOSPITAL_BASED_OUTPATIENT_CLINIC_OR_DEPARTMENT_OTHER): Payer: Self-pay | Admitting: Physical Therapy

## 2024-12-04 DIAGNOSIS — M6281 Muscle weakness (generalized): Secondary | ICD-10-CM

## 2024-12-04 DIAGNOSIS — R2689 Other abnormalities of gait and mobility: Secondary | ICD-10-CM | POA: Diagnosis not present

## 2024-12-04 DIAGNOSIS — M25511 Pain in right shoulder: Secondary | ICD-10-CM

## 2024-12-04 DIAGNOSIS — M25611 Stiffness of right shoulder, not elsewhere classified: Secondary | ICD-10-CM

## 2024-12-04 DIAGNOSIS — R2681 Unsteadiness on feet: Secondary | ICD-10-CM

## 2024-12-04 NOTE — Therapy (Signed)
 "      OUTPATIENT PHYSICAL THERAPY TREATMENT           Patient Name: Hailey Perkins MRN: [id number] DOB:02-Oct-1935, 89 y.o., female Today's Date: 12/04/2024  END OF SESSION:  PT End of Session - 12/04/24 1406     Visit Number 37    Number of Visits 46    Date for Recertification  12/30/24    Authorization Type BCBS MCR    PT Start Time 1404    PT Stop Time 1445    PT Time Calculation (min) 41 min    Activity Tolerance Patient tolerated treatment well    Behavior During Therapy WFL for tasks assessed/performed                 Past Medical History:  Diagnosis Date   Aortic atherosclerosis    Arthritis    Cataract 2010   bilateral eyes   Chicken pox    Cholelithiasis    Diverticulitis    Diverticulosis    GERD (gastroesophageal reflux disease)    Glaucoma    Hiatal hernia    History of frequent urinary tract infections    Hyperlipidemia    Osteopenia    Peripheral neuropathy    Sleep apnea    wears a CPAP   Urine incontinence    Past Surgical History:  Procedure Laterality Date   ABDOMINAL HYSTERECTOMY  1977   ANAL FISTULECTOMY  1985   ANKLE FRACTURE SURGERY Right 2014   APPENDECTOMY     BIOPSY  02/15/2021   Procedure: BIOPSY;  Surgeon: Wilhelmenia Aloha Raddle., MD;  Location: [facility];  Service: Gastroenterology;;   BREAST BIOPSY Bilateral 10+ yrs ago   BENIGN   BUNIONECTOMY Left 1975   Left Toe   CATARACT EXTRACTION, BILATERAL  2009   COLONOSCOPY WITH PROPOFOL  N/A 02/15/2021   Procedure: COLONOSCOPY WITH PROPOFOL ;  Surgeon: Wilhelmenia Aloha Raddle., MD;  Location: [facility];  Service: Gastroenterology;  Laterality: N/A;  ultraslimscope    DILATION AND CURETTAGE OF UTERUS  1972   eye PRK vision correction Left 10/09/2013   eye socket re-formed Left 2014   FOOT NEUROMA SURGERY Right 2003   plus bunionectomy, right great toe   HAMMER TOE SURGERY Right 2005   Right foot correction hammertoe little toe   HEMORRHOID SURGERY  1967    HIP SURGERY Right 2014   right hip femur pin implanted   KNEE ARTHROSCOPY Left 2010   debridement left knee scar tissue   LAPAROSCOPIC OOPHERECTOMY     1986, 1990   LASIK  1998   NEUROMA SURGERY Right 2003   Right Foot Neuroma, plus Bunionectomy, Right Great Toe   POLYPECTOMY  02/15/2021   Procedure: POLYPECTOMY;  Surgeon: Wilhelmenia Aloha Raddle., MD;  Location: [facility];  Service: Gastroenterology;;   RECTOCELE REPAIR  1968   REPLACEMENT TOTAL KNEE Left 01/2008   REPLACEMENT TOTAL KNEE Right 08/2008   retinal pucker correction Left 07/14/2012   schwannoma tumor (benign) removal  2011   right rib   SIGMOIDOSCOPY     TONSILLECTOMY AND ADENOIDECTOMY     VENTRAL HERNIA REPAIR  1993   Patient Active Problem List   Diagnosis Date Noted   Precordial chest pain 10/14/2024   Arthralgia of right elbow 08/24/2022   Bilateral wrist pain 08/24/2022   Neck pain 08/24/2022   Pain of right hip joint 08/24/2022   Chronic pain of right knee 05/27/2021   OSA on CPAP 05/27/2021   LLQ pain 12/16/2020  History of diverticulitis 12/16/2020   Hematochezia 12/16/2020   Abnormal colonoscopy 12/16/2020   Acquired trigger finger of right index finger 10/26/2020   Other specified arthritis, right hand 10/26/2020   Diverticulitis of colon 08/15/2020   Dry eyes, bilateral 10/10/2019   Sleep-related hypoxia 08/05/2019   OSA (obstructive sleep apnea) 08/05/2019   PLMD (periodic limb movement disorder) 08/05/2019   Cerebellar 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) 05/06/2019   Excessive daytime sleepiness 05/06/2019   Loud snoring 05/06/2019   Ventral hernia without obstruction or gangrene 02/22/2019   Chronic throat clearing 01/18/2019   Nocturnal cough 01/18/2019   Family history of colon cancer in father 01/18/2019   Diverticulosis 01/18/2019   Gait abnormality 12/27/2018   Numbness 12/27/2018   Status post total bilateral knee replacement 11/30/2018   DDD (degenerative disc  disease), cervical 11/30/2018   Status post carpal tunnel release 11/30/2018   DDD (degenerative disc disease), lumbar 10/26/2018   Primary osteoarthritis of both hands 10/26/2018   Primary osteoarthritis of both feet 10/26/2018   Gastroesophageal reflux disease 08/27/2018   Bilateral impacted cerumen 08/21/2018   Sensorineural hearing loss (SNHL) of both ears 08/21/2018   Tinnitus, bilateral 08/21/2018   Blood in urine 04/17/2018   Secondary open-angle glaucoma of left eye, moderate stage 03/10/2018   Early stage 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 10/05/2017   Epiretinal membrane (ERM) of left eye 10/05/2017   Pseudophakia of both eyes 10/05/2017   Osteopenia 03/19/2017   Mixed hyperlipidemia 08/18/2016   Generalized osteoarthritis of multiple sites 08/18/2016   Peripheral neuropathic pain 08/18/2016   Overactive bladder 08/18/2016   Glaucoma 08/18/2016   Cervical disc disorder with radiculopathy of cervical region 08/17/2016   Carpal tunnel syndrome, right upper limb 08/17/2016   Abnormal finding on mammography 06/14/2016   Breast lump 05/06/2015    PCP: Yolande Toribio MATSU, MD  REFERRING PROVIDER:  Yolande Toribio MATSU, MD   Delane Lye, MD   REFERRING DIAG:  R26.81 (ICD-10-CM) - Unsteadiness on feet/Unsteady gait   M25.511 (ICD-10-CM) - Pain in right shoulder   THERAPY DIAG:  Other abnormalities of gait and mobility  Unsteadiness on feet  Muscle weakness (generalized)  Right shoulder pain, unspecified chronicity  Stiffness of right shoulder, not elsewhere classified  Rationale for Evaluation and Treatment: Rehabilitation  ONSET DATE: 2021 ; PT order 04/04/2024  SUBJECTIVE:   SUBJECTIVE STATEMENT: Pt states she's doing ok today.  She denies any adverse effects after prior treatment.       PERTINENT HISTORY: Pt uses a SPC with walking.  Flexor tenotomy on 3 toes of R foot on 05/01/24--Pt reports she has no restrictions from  MD. Arthritis, osteopenia, peripheral neuropathy Bilat TKR  PAIN:  R shoulder:  4/10    PRECAUTIONS: Fall and Other: peripheral neuropathy   WEIGHT BEARING RESTRICTIONS: No  FALLS:  Has patient fallen in last 6 months? No  LIVING ENVIRONMENT: Lives with: lives alone Lives in:  1 story home Stairs: No Has following equipment at home: Single point cane, shower chair, and Grab bars  OCCUPATION:  Retired  PLOF: Independent  PATIENT GOALS: improved ability to pivot.  Improved walking on uneven terrain and performing stairs   OBJECTIVE:  Note: Objective measures were completed at Evaluation unless otherwise noted.  DIAGNOSTIC FINDINGS: Pt reports x rays were negative for fracture.   TREATMENT DATE:   12/04/24  Pulleys in flexion and scaption x 20 each Standing ladder walks in flexion x 10 reps Shelf reach x 10,  1# x10 reps Sidestepping on airex beam with UE support with SBA Tandem gait on airex beam with UE support with SBA Reciprocal stepping over 3 hurdles and onto airex with UE support on rail Standing DF one LE at a time 2x10 each LE Pt ascended and descended 5 steps with 1 rail with a step to and step through gait and SBA  Seated wand ER approx 10 reps  Reviewed shoulder HEP.                                                                  PATIENT EDUCATION:  Education details:  HEP, rationale of interventions, POC, dx, exercise form, and relevant anatomy.  PT answered pt's questions.   Person educated: Patient Education method: Explanation, demonstration, verbal cues Education comprehension: verbalized understanding and needs further education, verbal cues required, returned demonstration  HOME EXERCISE PROGRAM:   Access Code: [email or web address] URL: [email or web address] Date: 09/17/2024 Prepared by: Mose Minerva    ASSESSMENT:  CLINICAL IMPRESSION:  Pt performed exercises to improve shoulder ROM, functional reaching, gait, stability,  balance, and stair/curb performance.  Pt did well with reciprocal stepping over hurdles and requires UE support on rail.  PT added airex pad to the end of the hurdles to increase challenge and improve balance.  Pt performed 5 steps on the gym stairs with a step to and also step through gait using 1 rail while PT provided SBA.  Pt gives good effort with all exercises.  Pt requires an AD with gait for safety and balance.  PT reviewed shoulder HEP and answered pt's questions.  She tolerated treatment well and should benefit from cont skilled PT to improve balance, stability, functional mobility, shoulder ROM, LE strength, ambulation, and minimize fall risk.        OBJECTIVE IMPAIRMENTS: Abnormal gait, decreased activity tolerance, decreased balance, decreased endurance, decreased mobility, difficulty walking, and decreased strength.   ACTIVITY LIMITATIONS: standing, squatting, stairs, transfers, and locomotion level  PARTICIPATION LIMITATIONS: cleaning and community activity  PERSONAL FACTORS: 3+ comorbidities: Arthritis, bilat TKA, peripheral neuropathy are also affecting patient's functional outcome.   REHAB POTENTIAL: Good  CLINICAL DECISION MAKING: Stable/uncomplicated  EVALUATION COMPLEXITY: Low   GOALS:   SHORT TERM GOALS: Target date:  06/05/2024   Pt will be independent and compliant with HEP for improved strength, balance, stability, and mobility.  Baseline: Goal status: GOAL MET 06/27/24  2.  Pt will demo improved time on TUG test by at least 5 seconds for improved mobility and reduced fall risk.   Baseline:  Goal status: ONGOING  12/15  3.  Pt will report at least a 25% improvement in her balance.   Baseline:  Goal status: GOAL MET  06/27/24  4.  Pt will report at least a 25% improvement in daily usage of R UE without adverse effects.  Goal Status:  GOAL MET  11/04/24 Target date: 10/09/2024  5.  Pt will demo at least a 12-15 deg increase in R shoulder flexion AROM and a  5-7 deg increase in R elbow extension AROM for improved performance of reaching and performance of daily activities.  Goal Status:  GOAL MET  11/04/24 Target date: 10/16/2024      LONG TERM GOALS: Target date:  12/30/2024   Pt will  score at least 44/56 on the [facility] Assessment for improved balance and stability with daily tasks and mobility.  Baseline:  35/56 initial,  40/56 on 11/6 Goal status: NOT MET   2.  Pt will be able to load and unload her dishwasher with good stability and good confidence.  Baseline:  Goal status: GOAL MET  with holding onto counter  9/4  3.  Pt will demo improved bilat LE strength to 4+/5 in hip flexion, 5/5 in knee extension, 4-/5 in R ankle DF, and a 5# increase in L hip abduction for improved performance of functional mobility and performance of household chores.  Baseline:  Goal status:  ONGOING  4.  Pt will be able to ascend and descend stairs with a reciprocal gait with the rail.   Baseline:  Goal status: PROGRESSING  9/4  5.  Pt will report she is able to perform her normal ambulation with cane with good stability and confidence and without LOB. Baseline:  Goal status:ONGOING  6.  Pt will report she is able to perform her normal reaching activities including reaching into an overhead cabinet without significant difficulty and pain.   Goal status:  NOT MET  7.  Pt will be able to dress without difficulty.   Goal status:  NOT MET    8.  Pt will be able to perform her ADLs without significant shoulder pain and difficulty.   Goal status:  ONGOING  9.  Pt will report at least a 50% improvement in balance with turning her head and also closing heavy doors.   Goal status:  ONGOING   PLAN:  PT FREQUENCY: 2x/wk  PT DURATION: 6 weeks  PLANNED INTERVENTIONS: 02835- PT Re-evaluation, 97750- Physical Performance Testing, 97110-Therapeutic exercises, 97530- Therapeutic activity, [id number]- Neuromuscular re-education, 97535- Self Care, 02859- Manual  therapy, [phone]- Gait training, [phone]- Aquatic Therapy, [phone]- Electrical stimulation (unattended), Patient/Family education, Balance training, Stair training, Taping, DME instructions, Cryotherapy, and Moist heat  PLAN FOR NEXT SESSION: Work on [employer].  LE strengthening, gait, and balance activities.  Work on step ups and step downs.  Cont with shoulder ROM.    Leigh Minerva III PT, DPT 12/04/2024 11:07 PM                                                 "

## 2024-12-09 ENCOUNTER — Ambulatory Visit (HOSPITAL_BASED_OUTPATIENT_CLINIC_OR_DEPARTMENT_OTHER)
Admission: RE | Admit: 2024-12-09 | Discharge: 2024-12-09 | Disposition: A | Discharge status: Home / Self Care | Source: Ambulatory Visit | Attending: Nurse Practitioner | Admitting: Nurse Practitioner

## 2024-12-09 DIAGNOSIS — M85852 Other specified disorders of bone density and structure, left thigh: Secondary | ICD-10-CM | POA: Insufficient documentation

## 2024-12-10 ENCOUNTER — Ambulatory Visit (INDEPENDENT_AMBULATORY_CARE_PROVIDER_SITE_OTHER): Admitting: Neurology

## 2024-12-10 DIAGNOSIS — G4734 Idiopathic sleep related nonobstructive alveolar hypoventilation: Secondary | ICD-10-CM

## 2024-12-10 DIAGNOSIS — G3281 Cerebellar 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: Secondary | ICD-10-CM

## 2024-12-10 DIAGNOSIS — G4719 Other hypersomnia: Secondary | ICD-10-CM

## 2024-12-10 DIAGNOSIS — M5010 Cervical disc disorder with radiculopathy, unspecified cervical region: Secondary | ICD-10-CM

## 2024-12-10 DIAGNOSIS — G4733 Obstructive sleep apnea (adult) (pediatric): Secondary | ICD-10-CM

## 2024-12-10 DIAGNOSIS — R0683 Snoring: Secondary | ICD-10-CM

## 2024-12-10 DIAGNOSIS — M792 Neuralgia and neuritis, unspecified: Secondary | ICD-10-CM

## 2024-12-10 DIAGNOSIS — R0989 Other specified symptoms and signs involving the circulatory and respiratory systems: Secondary | ICD-10-CM

## 2024-12-10 DIAGNOSIS — M51362 Other intervertebral disc degeneration, lumbar region with discogenic back pain and lower extremity pain: Secondary | ICD-10-CM

## 2024-12-11 ENCOUNTER — Encounter (HOSPITAL_BASED_OUTPATIENT_CLINIC_OR_DEPARTMENT_OTHER): Payer: Self-pay

## 2024-12-11 ENCOUNTER — Ambulatory Visit (HOSPITAL_BASED_OUTPATIENT_CLINIC_OR_DEPARTMENT_OTHER): Attending: Internal Medicine

## 2024-12-11 DIAGNOSIS — M25511 Pain in right shoulder: Secondary | ICD-10-CM | POA: Insufficient documentation

## 2024-12-11 DIAGNOSIS — R2689 Other abnormalities of gait and mobility: Secondary | ICD-10-CM | POA: Diagnosis present

## 2024-12-11 DIAGNOSIS — R2681 Unsteadiness on feet: Secondary | ICD-10-CM | POA: Insufficient documentation

## 2024-12-11 DIAGNOSIS — M25611 Stiffness of right shoulder, not elsewhere classified: Secondary | ICD-10-CM | POA: Insufficient documentation

## 2024-12-11 DIAGNOSIS — M6281 Muscle weakness (generalized): Secondary | ICD-10-CM | POA: Insufficient documentation

## 2024-12-11 NOTE — Therapy (Signed)
 "      OUTPATIENT PHYSICAL THERAPY TREATMENT           Patient Name: Hailey Perkins MRN: [id number] DOB:May 15, 1935, 89 y.o., female Today's Date: 12/11/2024  END OF SESSION:  PT End of Session - 12/11/24 0856     Visit Number 38    Number of Visits 46    Date for Recertification  12/30/24    Authorization Type [employer] MCR    PT Start Time 0850    PT Stop Time 0933    PT Time Calculation (min) 43 min    Activity Tolerance Patient tolerated treatment well    Behavior During Therapy [facility] for tasks assessed/performed                  Past Medical History:  Diagnosis Date   Aortic atherosclerosis    Arthritis    Cataract 2010   bilateral eyes   Chicken pox    Cholelithiasis    Diverticulitis    Diverticulosis    GERD (gastroesophageal reflux disease)    Glaucoma    Hiatal hernia    History of frequent urinary tract infections    Hyperlipidemia    Osteopenia    Peripheral neuropathy    Sleep apnea    wears a CPAP   Urine incontinence    Past Surgical History:  Procedure Laterality Date   ABDOMINAL HYSTERECTOMY  1977   ANAL FISTULECTOMY  1985   ANKLE FRACTURE SURGERY Right 2014   APPENDECTOMY     BIOPSY  02/15/2021   Procedure: BIOPSY;  Surgeon: Wilhelmenia Aloha Raddle., MD;  Location: [facility];  Service: Gastroenterology;;   BREAST BIOPSY Bilateral 10+ yrs ago   BENIGN   BUNIONECTOMY Left 1975   Left Toe   CATARACT EXTRACTION, BILATERAL  2009   COLONOSCOPY WITH PROPOFOL  N/A 02/15/2021   Procedure: COLONOSCOPY WITH PROPOFOL ;  Surgeon: Wilhelmenia Aloha Raddle., MD;  Location: [facility];  Service: Gastroenterology;  Laterality: N/A;  ultraslimscope    DILATION AND CURETTAGE OF UTERUS  1972   eye PRK vision correction Left 10/09/2013   eye socket re-formed Left 2014   FOOT NEUROMA SURGERY Right 2003   plus bunionectomy, right great toe   HAMMER TOE SURGERY Right 2005   Right foot correction hammertoe little toe   HEMORRHOID SURGERY  1967    HIP SURGERY Right 2014   right hip femur pin implanted   KNEE ARTHROSCOPY Left 2010   debridement left knee scar tissue   LAPAROSCOPIC OOPHERECTOMY     1986, 1990   LASIK  1998   NEUROMA SURGERY Right 2003   Right Foot Neuroma, plus Bunionectomy, Right Great Toe   POLYPECTOMY  02/15/2021   Procedure: POLYPECTOMY;  Surgeon: Wilhelmenia Aloha Raddle., MD;  Location: [facility];  Service: Gastroenterology;;   RECTOCELE REPAIR  1968   REPLACEMENT TOTAL KNEE Left 01/2008   REPLACEMENT TOTAL KNEE Right 08/2008   retinal pucker correction Left 07/14/2012   schwannoma tumor (benign) removal  2011   right rib   SIGMOIDOSCOPY     TONSILLECTOMY AND ADENOIDECTOMY     VENTRAL HERNIA REPAIR  1993   Patient Active Problem List   Diagnosis Date Noted   Precordial chest pain 10/14/2024   Arthralgia of right elbow 08/24/2022   Bilateral wrist pain 08/24/2022   Neck pain 08/24/2022   Pain of right hip joint 08/24/2022   Chronic pain of right knee 05/27/2021   OSA on CPAP 05/27/2021   LLQ pain 12/16/2020  History of diverticulitis 12/16/2020   Hematochezia 12/16/2020   Abnormal colonoscopy 12/16/2020   Acquired trigger finger of right index finger 10/26/2020   Other specified arthritis, right hand 10/26/2020   Diverticulitis of colon 08/15/2020   Dry eyes, bilateral 10/10/2019   Sleep-related hypoxia 08/05/2019   OSA (obstructive sleep apnea) 08/05/2019   PLMD (periodic limb movement disorder) 08/05/2019   Cerebellar 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) 05/06/2019   Excessive daytime sleepiness 05/06/2019   Loud snoring 05/06/2019   Ventral hernia without obstruction or gangrene 02/22/2019   Chronic throat clearing 01/18/2019   Nocturnal cough 01/18/2019   Family history of colon cancer in father 01/18/2019   Diverticulosis 01/18/2019   Gait abnormality 12/27/2018   Numbness 12/27/2018   Status post total bilateral knee replacement 11/30/2018   DDD (degenerative disc  disease), cervical 11/30/2018   Status post carpal tunnel release 11/30/2018   DDD (degenerative disc disease), lumbar 10/26/2018   Primary osteoarthritis of both hands 10/26/2018   Primary osteoarthritis of both feet 10/26/2018   Gastroesophageal reflux disease 08/27/2018   Bilateral impacted cerumen 08/21/2018   Sensorineural hearing loss (SNHL) of both ears 08/21/2018   Tinnitus, bilateral 08/21/2018   Blood in urine 04/17/2018   Secondary open-angle glaucoma of left eye, moderate stage 03/10/2018   Early stage 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 10/05/2017   Epiretinal membrane (ERM) of left eye 10/05/2017   Pseudophakia of both eyes 10/05/2017   Osteopenia 03/19/2017   Mixed hyperlipidemia 08/18/2016   Generalized osteoarthritis of multiple sites 08/18/2016   Peripheral neuropathic pain 08/18/2016   Overactive bladder 08/18/2016   Glaucoma 08/18/2016   Cervical disc disorder with radiculopathy of cervical region 08/17/2016   Carpal tunnel syndrome, right upper limb 08/17/2016   Abnormal finding on mammography 06/14/2016   Breast lump 05/06/2015    PCP: Yolande Toribio MATSU, MD  REFERRING PROVIDER:  Yolande Toribio MATSU, MD   Delane Lye, MD   REFERRING DIAG:  R26.81 (ICD-10-CM) - Unsteadiness on feet/Unsteady gait   M25.511 (ICD-10-CM) - Pain in right shoulder   THERAPY DIAG:  Other abnormalities of gait and mobility  Unsteadiness on feet  Muscle weakness (generalized)  Right shoulder pain, unspecified chronicity  Stiffness of right shoulder, not elsewhere classified  Rationale for Evaluation and Treatment: Rehabilitation  ONSET DATE: 2021 ; PT order 04/04/2024  SUBJECTIVE:   SUBJECTIVE STATEMENT: Pt had sleep study last night, which went well. Took prednisone last week for a sore throat and thinks this helped with her R shoulder pain. No pain at rest. Pt had a fall on Thursday last week in her garage when she tripped over a runner  getting out of her car. She states she was able to get to a shelf and use it to help her stand up. Landed on R knee but denies injury of increase in pain. Removed runner from the area.     PERTINENT HISTORY: Pt uses a SPC with walking.  Flexor tenotomy on 3 toes of R foot on 05/01/24--Pt reports she has no restrictions from MD. Arthritis, osteopenia, peripheral neuropathy Bilat TKR  PAIN:  R shoulder:  0/10    PRECAUTIONS: Fall and Other: peripheral neuropathy   WEIGHT BEARING RESTRICTIONS: No  FALLS:  Has patient fallen in last 6 months? Yes. Number of falls 1 (12/05/24)  LIVING ENVIRONMENT: Lives with: lives alone Lives in:  1 story home Stairs: No Has following equipment at home: Single point cane, shower chair, and Grab bars  OCCUPATION:  Retired  PLOF: Independent  PATIENT GOALS: improved ability to pivot.  Improved walking on uneven terrain and performing stairs   OBJECTIVE:  Note: Objective measures were completed at Evaluation unless otherwise noted.  DIAGNOSTIC FINDINGS: Pt reports x rays were negative for fracture.   TREATMENT DATE:     12/11/24 -Pulleys flexion x , scaption x84min -Standing finger ladder walks flexion x10reps -STS 2x10 -seated toe raises 2x20 -seated marches 2x10 -standing HR 2x10 -hurdle step overs fwd and lateral x4hurdles x3 laps each with single UE support -SLS trials with fingertip support 10sec x5ea -Wlaking marching along rial with light UE support x4 laps -Sidestepping on airex beam x5laps -Gait in hall with [facility], cues for increased step length x164ft     12/04/24  Pulleys in flexion and scaption x 20 each Standing ladder walks in flexion x 10 reps Shelf reach x 10, 1# x10 reps Sidestepping on airex beam with UE support with SBA Tandem gait on airex beam with UE support with SBA Reciprocal stepping over 3 hurdles and onto airex with UE support on rail Standing DF one LE at a time 2x10 each LE Pt ascended and  descended 5 steps with 1 rail with a step to and step through gait and SBA  Seated wand ER approx 10 reps  Reviewed shoulder HEP.                                                                  PATIENT EDUCATION:  Education details:  HEP, rationale of interventions, POC, dx, exercise form, and relevant anatomy.  PT answered pt's questions.   Person educated: Patient Education method: Explanation, demonstration, verbal cues Education comprehension: verbalized understanding and needs further education, verbal cues required, returned demonstration  HOME EXERCISE PROGRAM:   Access Code: [email or web address] URL: [email or web address] Date: 09/17/2024 Prepared by: Mose Minerva    ASSESSMENT:  CLINICAL IMPRESSION:  No complaints of increased pain with shoulder focused exercises. She is very challenged by standing heel raises. Discussed continuing to perform these in water when she resumes her pool classes. With gait, she continues to demonstrates shortened step length, decreased heel contact, and decreased toe off. Worked on SLS trials to improve single leg stability which is likely affecting her gait quality. Pt tends to become unsteady with turning movement or head movements. She does note suffering from vertigo. Discussed PT intervention for this and to talk to PCP if this may help her.      OBJECTIVE IMPAIRMENTS: Abnormal gait, decreased activity tolerance, decreased balance, decreased endurance, decreased mobility, difficulty walking, and decreased strength.   ACTIVITY LIMITATIONS: standing, squatting, stairs, transfers, and locomotion level  PARTICIPATION LIMITATIONS: cleaning and community activity  PERSONAL FACTORS: 3+ comorbidities: Arthritis, bilat TKA, peripheral neuropathy are also affecting patient's functional outcome.   REHAB POTENTIAL: Good  CLINICAL DECISION MAKING: Stable/uncomplicated  EVALUATION COMPLEXITY: Low   GOALS:   SHORT TERM GOALS: Target  date:  06/05/2024   Pt will be independent and compliant with HEP for improved strength, balance, stability, and mobility.  Baseline: Goal status: GOAL MET 06/27/24  2.  Pt will demo improved time on TUG test by at least 5 seconds for improved mobility and reduced fall risk.   Baseline:  Goal status: ONGOING  12/15  3.  Pt will report at least a 25% improvement in her balance.   Baseline:  Goal status: GOAL MET  06/27/24  4.  Pt will report at least a 25% improvement in daily usage of R UE without adverse effects.  Goal Status:  GOAL MET  11/04/24 Target date: 10/09/2024  5.  Pt will demo at least a 12-15 deg increase in R shoulder flexion AROM and a 5-7 deg increase in R elbow extension AROM for improved performance of reaching and performance of daily activities.  Goal Status:  GOAL MET  11/04/24 Target date: 10/16/2024      LONG TERM GOALS: Target date:  12/30/2024   Pt will score at least 44/56 on the Berg Balance Assessment for improved balance and stability with daily tasks and mobility.  Baseline:  35/56 initial,  40/56 on 11/6 Goal status: NOT MET   2.  Pt will be able to load and unload her dishwasher with good stability and good confidence.  Baseline:  Goal status: GOAL MET  with holding onto counter  9/4  3.  Pt will demo improved bilat LE strength to 4+/5 in hip flexion, 5/5 in knee extension, 4-/5 in R ankle DF, and a 5# increase in L hip abduction for improved performance of functional mobility and performance of household chores.  Baseline:  Goal status:  ONGOING  4.  Pt will be able to ascend and descend stairs with a reciprocal gait with the rail.   Baseline:  Goal status: PROGRESSING  9/4  5.  Pt will report she is able to perform her normal ambulation with cane with good stability and confidence and without LOB. Baseline:  Goal status:ONGOING  6.  Pt will report she is able to perform her normal reaching activities including reaching into an overhead  cabinet without significant difficulty and pain.   Goal status:  NOT MET  7.  Pt will be able to dress without difficulty.   Goal status:  NOT MET    8.  Pt will be able to perform her ADLs without significant shoulder pain and difficulty.   Goal status:  ONGOING  9.  Pt will report at least a 50% improvement in balance with turning her head and also closing heavy doors.   Goal status:  ONGOING   PLAN:  PT FREQUENCY: 2x/wk  PT DURATION: 6 weeks  PLANNED INTERVENTIONS: 02835- PT Re-evaluation, 97750- Physical Performance Testing, 97110-Therapeutic exercises, 97530- Therapeutic activity, [id number]- Neuromuscular re-education, 97535- Self Care, [id number]- Manual therapy, [phone]- Gait training, [phone]- Aquatic Therapy, [phone]- Electrical stimulation (unattended), Patient/Family education, Balance training, Stair training, Taping, DME instructions, Cryotherapy, and Moist heat  PLAN FOR NEXT SESSION: Work on [employer].  LE strengthening, gait, and balance activities.  Work on step ups and step downs.  Cont with shoulder ROM.    Asberry Rodes, PTA  12/11/2024 9:48 AM                                                 "

## 2024-12-18 ENCOUNTER — Other Ambulatory Visit

## 2024-12-19 ENCOUNTER — Encounter (HOSPITAL_BASED_OUTPATIENT_CLINIC_OR_DEPARTMENT_OTHER): Payer: Self-pay

## 2024-12-19 ENCOUNTER — Ambulatory Visit (HOSPITAL_BASED_OUTPATIENT_CLINIC_OR_DEPARTMENT_OTHER)

## 2024-12-19 DIAGNOSIS — R2689 Other abnormalities of gait and mobility: Secondary | ICD-10-CM | POA: Diagnosis not present

## 2024-12-19 DIAGNOSIS — M25511 Pain in right shoulder: Secondary | ICD-10-CM

## 2024-12-19 DIAGNOSIS — M6281 Muscle weakness (generalized): Secondary | ICD-10-CM

## 2024-12-19 DIAGNOSIS — R2681 Unsteadiness on feet: Secondary | ICD-10-CM

## 2024-12-19 DIAGNOSIS — M25611 Stiffness of right shoulder, not elsewhere classified: Secondary | ICD-10-CM

## 2024-12-19 NOTE — Therapy (Signed)
 "      OUTPATIENT PHYSICAL THERAPY TREATMENT           Patient Name: Bitha Fauteux MRN: [id number] DOB:February 05, 1935, 89 y.o., female Today's Date: 12/19/2024  END OF SESSION:  PT End of Session - 12/19/24 1306     Visit Number 39    Number of Visits 46    Date for Recertification  12/30/24    Authorization Type BCBS MCR    PT Start Time 1303    PT Stop Time 1345    PT Time Calculation (min) 42 min    Activity Tolerance Patient tolerated treatment well    Behavior During Therapy WFL for tasks assessed/performed                   Past Medical History:  Diagnosis Date   Aortic atherosclerosis    Arthritis    Cataract 2010   bilateral eyes   Chicken pox    Cholelithiasis    Diverticulitis    Diverticulosis    GERD (gastroesophageal reflux disease)    Glaucoma    Hiatal hernia    History of frequent urinary tract infections    Hyperlipidemia    Osteopenia    Peripheral neuropathy    Sleep apnea    wears a CPAP   Urine incontinence    Past Surgical History:  Procedure Laterality Date   ABDOMINAL HYSTERECTOMY  1977   ANAL FISTULECTOMY  1985   ANKLE FRACTURE SURGERY Right 2014   APPENDECTOMY     BIOPSY  02/15/2021   Procedure: BIOPSY;  Surgeon: Wilhelmenia Aloha Raddle., MD;  Location: [facility];  Service: Gastroenterology;;   BREAST BIOPSY Bilateral 10+ yrs ago   BENIGN   BUNIONECTOMY Left 1975   Left Toe   CATARACT EXTRACTION, BILATERAL  2009   COLONOSCOPY WITH PROPOFOL  N/A 02/15/2021   Procedure: COLONOSCOPY WITH PROPOFOL ;  Surgeon: Wilhelmenia Aloha Raddle., MD;  Location: [facility];  Service: Gastroenterology;  Laterality: N/A;  ultraslimscope    DILATION AND CURETTAGE OF UTERUS  1972   eye PRK vision correction Left 10/09/2013   eye socket re-formed Left 2014   FOOT NEUROMA SURGERY Right 2003   plus bunionectomy, right great toe   HAMMER TOE SURGERY Right 2005   Right foot correction hammertoe little toe   HEMORRHOID SURGERY  1967    HIP SURGERY Right 2014   right hip femur pin implanted   KNEE ARTHROSCOPY Left 2010   debridement left knee scar tissue   LAPAROSCOPIC OOPHERECTOMY     1986, 1990   LASIK  1998   NEUROMA SURGERY Right 2003   Right Foot Neuroma, plus Bunionectomy, Right Great Toe   POLYPECTOMY  02/15/2021   Procedure: POLYPECTOMY;  Surgeon: Wilhelmenia Aloha Raddle., MD;  Location: [facility];  Service: Gastroenterology;;   RECTOCELE REPAIR  1968   REPLACEMENT TOTAL KNEE Left 01/2008   REPLACEMENT TOTAL KNEE Right 08/2008   retinal pucker correction Left 07/14/2012   schwannoma tumor (benign) removal  2011   right rib   SIGMOIDOSCOPY     TONSILLECTOMY AND ADENOIDECTOMY     VENTRAL HERNIA REPAIR  1993   Patient Active Problem List   Diagnosis Date Noted   Precordial chest pain 10/14/2024   Arthralgia of right elbow 08/24/2022   Bilateral wrist pain 08/24/2022   Neck pain 08/24/2022   Pain of right hip joint 08/24/2022   Chronic pain of right knee 05/27/2021   OSA on CPAP 05/27/2021   LLQ pain  12/16/2020   History of diverticulitis 12/16/2020   Hematochezia 12/16/2020   Abnormal colonoscopy 12/16/2020   Acquired trigger finger of right index finger 10/26/2020   Other specified arthritis, right hand 10/26/2020   Diverticulitis of colon 08/15/2020   Dry eyes, bilateral 10/10/2019   Sleep-related hypoxia 08/05/2019   OSA (obstructive sleep apnea) 08/05/2019   PLMD (periodic limb movement disorder) 08/05/2019   Cerebellar 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) 05/06/2019   Excessive daytime sleepiness 05/06/2019   Loud snoring 05/06/2019   Ventral hernia without obstruction or gangrene 02/22/2019   Chronic throat clearing 01/18/2019   Nocturnal cough 01/18/2019   Family history of colon cancer in father 01/18/2019   Diverticulosis 01/18/2019   Gait abnormality 12/27/2018   Numbness 12/27/2018   Status post total bilateral knee replacement 11/30/2018   DDD (degenerative disc  disease), cervical 11/30/2018   Status post carpal tunnel release 11/30/2018   DDD (degenerative disc disease), lumbar 10/26/2018   Primary osteoarthritis of both hands 10/26/2018   Primary osteoarthritis of both feet 10/26/2018   Gastroesophageal reflux disease 08/27/2018   Bilateral impacted cerumen 08/21/2018   Sensorineural hearing loss (SNHL) of both ears 08/21/2018   Tinnitus, bilateral 08/21/2018   Blood in urine 04/17/2018   Secondary open-angle glaucoma of left eye, moderate stage 03/10/2018   Early stage 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 10/05/2017   Epiretinal membrane (ERM) of left eye 10/05/2017   Pseudophakia of both eyes 10/05/2017   Osteopenia 03/19/2017   Mixed hyperlipidemia 08/18/2016   Generalized osteoarthritis of multiple sites 08/18/2016   Peripheral neuropathic pain 08/18/2016   Overactive bladder 08/18/2016   Glaucoma 08/18/2016   Cervical disc disorder with radiculopathy of cervical region 08/17/2016   Carpal tunnel syndrome, right upper limb 08/17/2016   Abnormal finding on mammography 06/14/2016   Breast lump 05/06/2015    PCP: Yolande Toribio MATSU, MD  REFERRING PROVIDER:  Yolande Toribio MATSU, MD   Delane Lye, MD   REFERRING DIAG:  R26.81 (ICD-10-CM) - Unsteadiness on feet/Unsteady gait   M25.511 (ICD-10-CM) - Pain in right shoulder   THERAPY DIAG:  Stiffness of right shoulder, not elsewhere classified  Right shoulder pain, unspecified chronicity  Unsteadiness on feet  Other abnormalities of gait and mobility  Muscle weakness (generalized)  Rationale for Evaluation and Treatment: Rehabilitation  ONSET DATE: 2021 ; PT order 04/04/2024  SUBJECTIVE:   SUBJECTIVE STATEMENT: Pt had sleep study last night, which went well. Took prednisone last week for a sore throat and thinks this helped with her R shoulder pain. No pain at rest. Pt had a fall on Thursday last week in her garage when she tripped over a runner  getting out of her car. She states she was able to get to a shelf and use it to help her stand up. Landed on R knee but denies injury of increase in pain. Removed runner from the area.     PERTINENT HISTORY: Pt uses a SPC with walking.  Flexor tenotomy on 3 toes of R foot on 05/01/24--Pt reports she has no restrictions from MD. Arthritis, osteopenia, peripheral neuropathy Bilat TKR  PAIN:  R shoulder:  0/10    PRECAUTIONS: Fall and Other: peripheral neuropathy   WEIGHT BEARING RESTRICTIONS: No  FALLS:  Has patient fallen in last 6 months? Yes. Number of falls 1 (12/05/24)  LIVING ENVIRONMENT: Lives with: lives alone Lives in:  1 story home Stairs: No Has following equipment at home: Single point cane, shower chair, and Grab  bars  OCCUPATION:  Retired  PLOF: Independent  PATIENT GOALS: improved ability to pivot.  Improved walking on uneven terrain and performing stairs   OBJECTIVE:  Note: Objective measures were completed at Evaluation unless otherwise noted.  DIAGNOSTIC FINDINGS: Pt reports x rays were negative for fracture.   TREATMENT DATE:     12/11/24 -Pulleys flexion x , scaption x60min -Standing finger ladder walks flexion x10reps -STS 2x10 -seated toe raises 2x20 -seated marches 2x10 -standing HR 2x10 -hurdle step overs fwd and lateral x4hurdles x3 laps each with single UE support -SLS trials with fingertip support 10sec x5ea -Wlaking marching along rial with light UE support x4 laps -Sidestepping on airex beam x5laps -Gait in hall with [facility], cues for increased step length x160ft     12/04/24  Pulleys in flexion and scaption x 20 each Standing ladder walks in flexion x 10 reps Shelf reach x 10, 1# x10 reps Sidestepping on airex beam with UE support with SBA Tandem gait on airex beam with UE support with SBA Reciprocal stepping over 3 hurdles and onto airex with UE support on rail Standing DF one LE at a time 2x10 each LE Pt ascended and  descended 5 steps with 1 rail with a step to and step through gait and SBA  Seated wand ER approx 10 reps  Reviewed shoulder HEP.                                                                  PATIENT EDUCATION:  Education details:  HEP, rationale of interventions, POC, dx, exercise form, and relevant anatomy.  PT answered pt's questions.   Person educated: Patient Education method: Explanation, demonstration, verbal cues Education comprehension: verbalized understanding and needs further education, verbal cues required, returned demonstration  HOME EXERCISE PROGRAM:   Access Code: [email or web address] URL: [email or web address] Date: 09/17/2024 Prepared by: Mose Minerva    ASSESSMENT:  CLINICAL IMPRESSION:  No complaints of increased pain with shoulder focused exercises. She is very challenged by standing heel raises. Discussed continuing to perform these in water when she resumes her pool classes. With gait, she continues to demonstrates shortened step length, decreased heel contact, and decreased toe off. Worked on SLS trials to improve single leg stability which is likely affecting her gait quality. Pt tends to become unsteady with turning movement or head movements. She does note suffering from vertigo. Discussed PT intervention for this and to talk to PCP if this may help her.      OBJECTIVE IMPAIRMENTS: Abnormal gait, decreased activity tolerance, decreased balance, decreased endurance, decreased mobility, difficulty walking, and decreased strength.   ACTIVITY LIMITATIONS: standing, squatting, stairs, transfers, and locomotion level  PARTICIPATION LIMITATIONS: cleaning and community activity  PERSONAL FACTORS: 3+ comorbidities: Arthritis, bilat TKA, peripheral neuropathy are also affecting patient's functional outcome.   REHAB POTENTIAL: Good  CLINICAL DECISION MAKING: Stable/uncomplicated  EVALUATION COMPLEXITY: Low   GOALS:   SHORT TERM GOALS: Target  date:  06/05/2024   Pt will be independent and compliant with HEP for improved strength, balance, stability, and mobility.  Baseline: Goal status: GOAL MET 06/27/24  2.  Pt will demo improved time on TUG test by at least 5 seconds for improved mobility and reduced fall risk.   Baseline:  Goal status: ONGOING  12/15  3.  Pt will report at least a 25% improvement in her balance.   Baseline:  Goal status: GOAL MET  06/27/24  4.  Pt will report at least a 25% improvement in daily usage of R UE without adverse effects.  Goal Status:  GOAL MET  11/04/24 Target date: 10/09/2024  5.  Pt will demo at least a 12-15 deg increase in R shoulder flexion AROM and a 5-7 deg increase in R elbow extension AROM for improved performance of reaching and performance of daily activities.  Goal Status:  GOAL MET  11/04/24 Target date: 10/16/2024      LONG TERM GOALS: Target date:  12/30/2024   Pt will score at least 44/56 on the Berg Balance Assessment for improved balance and stability with daily tasks and mobility.  Baseline:  35/56 initial,  40/56 on 11/6 Goal status: NOT MET   2.  Pt will be able to load and unload her dishwasher with good stability and good confidence.  Baseline:  Goal status: GOAL MET  with holding onto counter  9/4  3.  Pt will demo improved bilat LE strength to 4+/5 in hip flexion, 5/5 in knee extension, 4-/5 in R ankle DF, and a 5# increase in L hip abduction for improved performance of functional mobility and performance of household chores.  Baseline:  Goal status:  ONGOING  4.  Pt will be able to ascend and descend stairs with a reciprocal gait with the rail.   Baseline:  Goal status: PROGRESSING  9/4  5.  Pt will report she is able to perform her normal ambulation with cane with good stability and confidence and without LOB. Baseline:  Goal status:ONGOING  6.  Pt will report she is able to perform her normal reaching activities including reaching into an overhead  cabinet without significant difficulty and pain.   Goal status:  NOT MET  7.  Pt will be able to dress without difficulty.   Goal status:  NOT MET    8.  Pt will be able to perform her ADLs without significant shoulder pain and difficulty.   Goal status:  ONGOING  9.  Pt will report at least a 50% improvement in balance with turning her head and also closing heavy doors.   Goal status:  ONGOING   PLAN:  PT FREQUENCY: 2x/wk  PT DURATION: 6 weeks  PLANNED INTERVENTIONS: 02835- PT Re-evaluation, 97750- Physical Performance Testing, 97110-Therapeutic exercises, 97530- Therapeutic activity, [id number]- Neuromuscular re-education, 97535- Self Care, [id number]- Manual therapy, [phone]- Gait training, [phone]- Aquatic Therapy, [phone]- Electrical stimulation (unattended), Patient/Family education, Balance training, Stair training, Taping, DME instructions, Cryotherapy, and Moist heat  PLAN FOR NEXT SESSION: Work on [employer].  LE strengthening, gait, and balance activities.  Work on step ups and step downs.  Cont with shoulder ROM.    Asberry Rodes, PTA  12/19/24 2:53 PM                                                      OUTPATIENT PHYSICAL THERAPY TREATMENT           Patient Name: Kery Haltiwanger MRN: [id number] DOB:10/25/1935, 89 y.o., female Today's Date: 12/19/2024  END OF SESSION:  PT End of Session - 12/19/24 1306     Visit Number 39    Number of Visits  46    Date for Recertification  12/30/24    Authorization Type BCBS MCR    PT Start Time 1303    PT Stop Time 1345    PT Time Calculation (min) 42 min    Activity Tolerance Patient tolerated treatment well    Behavior During Therapy WFL for tasks assessed/performed                   Past Medical History:  Diagnosis Date   Aortic atherosclerosis    Arthritis    Cataract 2010   bilateral eyes   Chicken pox    Cholelithiasis    Diverticulitis    Diverticulosis     GERD (gastroesophageal reflux disease)    Glaucoma    Hiatal hernia    History of frequent urinary tract infections    Hyperlipidemia    Osteopenia    Peripheral neuropathy    Sleep apnea    wears a CPAP   Urine incontinence    Past Surgical History:  Procedure Laterality Date   ABDOMINAL HYSTERECTOMY  1977   ANAL FISTULECTOMY  1985   ANKLE FRACTURE SURGERY Right 2014   APPENDECTOMY     BIOPSY  02/15/2021   Procedure: BIOPSY;  Surgeon: Wilhelmenia Aloha Raddle., MD;  Location: [facility];  Service: Gastroenterology;;   BREAST BIOPSY Bilateral 10+ yrs ago   BENIGN   BUNIONECTOMY Left 1975   Left Toe   CATARACT EXTRACTION, BILATERAL  2009   COLONOSCOPY WITH PROPOFOL  N/A 02/15/2021   Procedure: COLONOSCOPY WITH PROPOFOL ;  Surgeon: Wilhelmenia Aloha Raddle., MD;  Location: [facility];  Service: Gastroenterology;  Laterality: N/A;  ultraslimscope    DILATION AND CURETTAGE OF UTERUS  1972   eye PRK vision correction Left 10/09/2013   eye socket re-formed Left 2014   FOOT NEUROMA SURGERY Right 2003   plus bunionectomy, right great toe   HAMMER TOE SURGERY Right 2005   Right foot correction hammertoe little toe   HEMORRHOID SURGERY  1967   HIP SURGERY Right 2014   right hip femur pin implanted   KNEE ARTHROSCOPY Left 2010   debridement left knee scar tissue   LAPAROSCOPIC OOPHERECTOMY     1986, 1990   LASIK  1998   NEUROMA SURGERY Right 2003   Right Foot Neuroma, plus Bunionectomy, Right Great Toe   POLYPECTOMY  02/15/2021   Procedure: POLYPECTOMY;  Surgeon: Wilhelmenia Aloha Raddle., MD;  Location: [facility];  Service: Gastroenterology;;   RECTOCELE REPAIR  1968   REPLACEMENT TOTAL KNEE Left 01/2008   REPLACEMENT TOTAL KNEE Right 08/2008   retinal pucker correction Left 07/14/2012   schwannoma tumor (benign) removal  2011   right rib   SIGMOIDOSCOPY     TONSILLECTOMY AND ADENOIDECTOMY     VENTRAL HERNIA REPAIR  1993   Patient Active Problem List   Diagnosis Date  Noted   Precordial chest pain 10/14/2024   Arthralgia of right elbow 08/24/2022   Bilateral wrist pain 08/24/2022   Neck pain 08/24/2022   Pain of right hip joint 08/24/2022   Chronic pain of right knee 05/27/2021   OSA on CPAP 05/27/2021   LLQ pain 12/16/2020   History of diverticulitis 12/16/2020   Hematochezia 12/16/2020   Abnormal colonoscopy 12/16/2020   Acquired trigger finger of right index finger 10/26/2020   Other specified arthritis, right hand 10/26/2020   Diverticulitis of colon 08/15/2020   Dry eyes, bilateral 10/10/2019   Sleep-related hypoxia 08/05/2019   OSA (obstructive sleep apnea) 08/05/2019  PLMD (periodic limb movement disorder) 08/05/2019   Cerebellar 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) 05/06/2019   Excessive daytime sleepiness 05/06/2019   Loud snoring 05/06/2019   Ventral hernia without obstruction or gangrene 02/22/2019   Chronic throat clearing 01/18/2019   Nocturnal cough 01/18/2019   Family history of colon cancer in father 01/18/2019   Diverticulosis 01/18/2019   Gait abnormality 12/27/2018   Numbness 12/27/2018   Status post total bilateral knee replacement 11/30/2018   DDD (degenerative disc disease), cervical 11/30/2018   Status post carpal tunnel release 11/30/2018   DDD (degenerative disc disease), lumbar 10/26/2018   Primary osteoarthritis of both hands 10/26/2018   Primary osteoarthritis of both feet 10/26/2018   Gastroesophageal reflux disease 08/27/2018   Bilateral impacted cerumen 08/21/2018   Sensorineural hearing loss (SNHL) of both ears 08/21/2018   Tinnitus, bilateral 08/21/2018   Blood in urine 04/17/2018   Secondary open-angle glaucoma of left eye, moderate stage 03/10/2018   Early stage 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 10/05/2017   Epiretinal membrane (ERM) of left eye 10/05/2017   Pseudophakia of both eyes 10/05/2017   Osteopenia 03/19/2017   Mixed hyperlipidemia 08/18/2016   Generalized  osteoarthritis of multiple sites 08/18/2016   Peripheral neuropathic pain 08/18/2016   Overactive bladder 08/18/2016   Glaucoma 08/18/2016   Cervical disc disorder with radiculopathy of cervical region 08/17/2016   Carpal tunnel syndrome, right upper limb 08/17/2016   Abnormal finding on mammography 06/14/2016   Breast lump 05/06/2015    PCP: Yolande Toribio MATSU, MD  REFERRING PROVIDER:  Yolande Toribio MATSU, MD   Delane Lye, MD   REFERRING DIAG:  R26.81 (ICD-10-CM) - Unsteadiness on feet/Unsteady gait   M25.511 (ICD-10-CM) - Pain in right shoulder   THERAPY DIAG:  Stiffness of right shoulder, not elsewhere classified  Right shoulder pain, unspecified chronicity  Unsteadiness on feet  Other abnormalities of gait and mobility  Muscle weakness (generalized)  Rationale for Evaluation and Treatment: Rehabilitation  ONSET DATE: 2021 ; PT order 04/04/2024  SUBJECTIVE:   SUBJECTIVE STATEMENT: Pt reports no more falls. She saw MD on Tuesday and had x ray for R shoulder. She still has sharp pains in certain movements. Still waiting on xray results. Moving arm straight up and down feels okay.     PERTINENT HISTORY: Pt uses a SPC with walking.  Flexor tenotomy on 3 toes of R foot on 05/01/24--Pt reports she has no restrictions from MD. Arthritis, osteopenia, peripheral neuropathy Bilat TKR  PAIN:  R shoulder:  0/10    PRECAUTIONS: Fall and Other: peripheral neuropathy   WEIGHT BEARING RESTRICTIONS: No  FALLS:  Has patient fallen in last 6 months? Yes. Number of falls 1 (12/05/24)  LIVING ENVIRONMENT: Lives with: lives alone Lives in:  1 story home Stairs: No Has following equipment at home: Single point cane, shower chair, and Grab bars  OCCUPATION:  Retired  PLOF: Independent  PATIENT GOALS: improved ability to pivot.  Improved walking on uneven terrain and performing stairs   OBJECTIVE:  Note: Objective measures were completed at Evaluation unless  otherwise noted.  DIAGNOSTIC FINDINGS: Pt reports x rays were negative for fracture.   TREATMENT DATE:     12/19/24 Pulleys x24min flexion, x47min scaption -[place] ladder flexion x10 -standing HR 2x10 -walking marches with rail support x2 laps -Exaggerated heel contact and toe off gait with rail x2 laps -side stepping along rail x2 laps -retro walking along rail x2 laps (cues for longer steps) -squats at rail x10 -  HSC machine 25# 2x10 -leg extension machine 10# 2x10     12/11/24 -Pulleys flexion x , scaption x50min -Standing finger ladder walks flexion x10reps -STS 2x10 -seated toe raises 2x20 -seated marches 2x10 -standing HR 2x10 -hurdle step overs fwd and lateral x4hurdles x3 laps each with single UE support -SLS trials with fingertip support 10sec x5ea -Wlaking marching along rial with light UE support x4 laps -Sidestepping on airex beam x5laps -Gait in hall with [facility], cues for increased step length x149ft     12/04/24  Pulleys in flexion and scaption x 20 each Standing ladder walks in flexion x 10 reps Shelf reach x 10, 1# x10 reps Sidestepping on airex beam with UE support with SBA Tandem gait on airex beam with UE support with SBA Reciprocal stepping over 3 hurdles and onto airex with UE support on rail Standing DF one LE at a time 2x10 each LE Pt ascended and descended 5 steps with 1 rail with a step to and step through gait and SBA  Seated wand ER approx 10 reps  Reviewed shoulder HEP.                                                                  PATIENT EDUCATION:  Education details:  HEP, rationale of interventions, POC, dx, exercise form, and relevant anatomy.  PT answered pt's questions.   Person educated: Patient Education method: Explanation, demonstration, verbal cues Education comprehension: verbalized understanding and needs further education, verbal cues required, returned demonstration  HOME EXERCISE PROGRAM:   Access Code:  [email or web address] URL: [email or web address] Date: 09/17/2024 Prepared by: Mose Minerva    ASSESSMENT:  CLINICAL IMPRESSION:  Good tolerance for PT interventions today. Able to incorporate some gym machine strengthening as pt would like to return to gym program in the future. She tolerated this well, but was education on DOMS and expectations. Worked on gait and NMC interventions to improve stability and control with walking. Decreased foot slap noted with heel contact cuing. Pt had good carryover of cuing for increased step length with gait using SPC in clinic. Will continue to progress as tolerated.      OBJECTIVE IMPAIRMENTS: Abnormal gait, decreased activity tolerance, decreased balance, decreased endurance, decreased mobility, difficulty walking, and decreased strength.   ACTIVITY LIMITATIONS: standing, squatting, stairs, transfers, and locomotion level  PARTICIPATION LIMITATIONS: cleaning and community activity  PERSONAL FACTORS: 3+ comorbidities: Arthritis, bilat TKA, peripheral neuropathy are also affecting patient's functional outcome.   REHAB POTENTIAL: Good  CLINICAL DECISION MAKING: Stable/uncomplicated  EVALUATION COMPLEXITY: Low   GOALS:   SHORT TERM GOALS: Target date:  06/05/2024   Pt will be independent and compliant with HEP for improved strength, balance, stability, and mobility.  Baseline: Goal status: GOAL MET 06/27/24  2.  Pt will demo improved time on TUG test by at least 5 seconds for improved mobility and reduced fall risk.   Baseline:  Goal status: ONGOING  12/15  3.  Pt will report at least a 25% improvement in her balance.   Baseline:  Goal status: GOAL MET  06/27/24  4.  Pt will report at least a 25% improvement in daily usage of R UE without adverse effects.  Goal Status:  GOAL MET  11/04/24 Target date: 10/09/2024  5.  Pt  will demo at least a 12-15 deg increase in R shoulder flexion AROM and a 5-7 deg increase in R elbow extension  AROM for improved performance of reaching and performance of daily activities.  Goal Status:  GOAL MET  11/04/24 Target date: 10/16/2024      LONG TERM GOALS: Target date:  12/30/2024   Pt will score at least 44/56 on the Berg Balance Assessment for improved balance and stability with daily tasks and mobility.  Baseline:  35/56 initial,  40/56 on 11/6 Goal status: NOT MET   2.  Pt will be able to load and unload her dishwasher with good stability and good confidence.  Baseline:  Goal status: GOAL MET  with holding onto counter  9/4  3.  Pt will demo improved bilat LE strength to 4+/5 in hip flexion, 5/5 in knee extension, 4-/5 in R ankle DF, and a 5# increase in L hip abduction for improved performance of functional mobility and performance of household chores.  Baseline:  Goal status:  ONGOING  4.  Pt will be able to ascend and descend stairs with a reciprocal gait with the rail.   Baseline:  Goal status: PROGRESSING  9/4  5.  Pt will report she is able to perform her normal ambulation with cane with good stability and confidence and without LOB. Baseline:  Goal status:ONGOING  6.  Pt will report she is able to perform her normal reaching activities including reaching into an overhead cabinet without significant difficulty and pain.   Goal status:  NOT MET  7.  Pt will be able to dress without difficulty.   Goal status:  NOT MET    8.  Pt will be able to perform her ADLs without significant shoulder pain and difficulty.   Goal status:  ONGOING  9.  Pt will report at least a 50% improvement in balance with turning her head and also closing heavy doors.   Goal status:  ONGOING   PLAN:  PT FREQUENCY: 2x/wk  PT DURATION: 6 weeks  PLANNED INTERVENTIONS: 02835- PT Re-evaluation, 97750- Physical Performance Testing, 97110-Therapeutic exercises, 97530- Therapeutic activity, [id number]- Neuromuscular re-education, 97535- Self Care, 02859- Manual therapy, [phone]- Gait training, [phone]-  Aquatic Therapy, [phone]- Electrical stimulation (unattended), Patient/Family education, Balance training, Stair training, Taping, DME instructions, Cryotherapy, and Moist heat  PLAN FOR NEXT SESSION: Work on [employer].  LE strengthening, gait, and balance activities.  Work on step ups and step downs.  Cont with shoulder ROM.    Asberry Rodes, PTA  12/19/24 2:53 PM                                                 "

## 2024-12-20 ENCOUNTER — Other Ambulatory Visit: Payer: Self-pay | Admitting: Neurology

## 2024-12-20 DIAGNOSIS — M5030 Other cervical disc degeneration, unspecified cervical region: Secondary | ICD-10-CM

## 2024-12-20 DIAGNOSIS — M51362 Other intervertebral disc degeneration, lumbar region with discogenic back pain and lower extremity pain: Secondary | ICD-10-CM

## 2024-12-20 DIAGNOSIS — M792 Neuralgia and neuritis, unspecified: Secondary | ICD-10-CM

## 2024-12-20 DIAGNOSIS — G4733 Obstructive sleep apnea (adult) (pediatric): Secondary | ICD-10-CM
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
## 2024-12-21 ENCOUNTER — Encounter: Payer: Self-pay | Admitting: Neurology

## 2024-12-21 DIAGNOSIS — G4733 Obstructive sleep apnea (adult) (pediatric): Secondary | ICD-10-CM

## 2024-12-21 DIAGNOSIS — M792 Neuralgia and neuritis, unspecified: Secondary | ICD-10-CM

## 2024-12-21 DIAGNOSIS — M5030 Other cervical disc degeneration, unspecified cervical region: Secondary | ICD-10-CM

## 2024-12-23 ENCOUNTER — Encounter: Payer: Self-pay | Admitting: Family Medicine

## 2024-12-23 ENCOUNTER — Ambulatory Visit: Payer: Self-pay | Admitting: Neurology

## 2024-12-23 DIAGNOSIS — G4733 Obstructive sleep apnea (adult) (pediatric): Secondary | ICD-10-CM

## 2024-12-23 DIAGNOSIS — M792 Neuralgia and neuritis, unspecified: Secondary | ICD-10-CM

## 2024-12-23 DIAGNOSIS — M5030 Other cervical disc degeneration, unspecified cervical region: Secondary | ICD-10-CM

## 2024-12-24 ENCOUNTER — Ambulatory Visit (HOSPITAL_BASED_OUTPATIENT_CLINIC_OR_DEPARTMENT_OTHER)

## 2024-12-24 ENCOUNTER — Encounter (HOSPITAL_BASED_OUTPATIENT_CLINIC_OR_DEPARTMENT_OTHER): Payer: Self-pay

## 2024-12-24 DIAGNOSIS — M6281 Muscle weakness (generalized): Secondary | ICD-10-CM

## 2024-12-24 DIAGNOSIS — R2689 Other abnormalities of gait and mobility: Secondary | ICD-10-CM

## 2024-12-24 DIAGNOSIS — M25611 Stiffness of right shoulder, not elsewhere classified: Secondary | ICD-10-CM

## 2024-12-24 DIAGNOSIS — M25511 Pain in right shoulder: Secondary | ICD-10-CM

## 2024-12-24 DIAGNOSIS — R2681 Unsteadiness on feet: Secondary | ICD-10-CM

## 2024-12-24 NOTE — Therapy (Deleted)
 "      OUTPATIENT PHYSICAL THERAPY TREATMENT           Patient Name: Hailey Perkins MRN: [id number] DOB:1934-12-28, 89 y.o., female Today's Date: 12/24/2024  END OF SESSION:  PT End of Session - 12/24/24 1304     Visit Number 40    Number of Visits 46    Date for Recertification  12/30/24    Authorization Type BCBS MCR    PT Start Time 1302    PT Stop Time 1345    PT Time Calculation (min) 43 min    Activity Tolerance Patient tolerated treatment well    Behavior During Therapy WFL for tasks assessed/performed                   Past Medical History:  Diagnosis Date   Aortic atherosclerosis    Arthritis    Cataract 2010   bilateral eyes   Chicken pox    Cholelithiasis    Diverticulitis    Diverticulosis    GERD (gastroesophageal reflux disease)    Glaucoma    Hiatal hernia    History of frequent urinary tract infections    Hyperlipidemia    Osteopenia    Peripheral neuropathy    Sleep apnea    wears a CPAP   Urine incontinence    Past Surgical History:  Procedure Laterality Date   ABDOMINAL HYSTERECTOMY  1977   ANAL FISTULECTOMY  1985   ANKLE FRACTURE SURGERY Right 2014   APPENDECTOMY     BIOPSY  02/15/2021   Procedure: BIOPSY;  Surgeon: Wilhelmenia Aloha Raddle., MD;  Location: [facility];  Service: Gastroenterology;;   BREAST BIOPSY Bilateral 10+ yrs ago   BENIGN   BUNIONECTOMY Left 1975   Left Toe   CATARACT EXTRACTION, BILATERAL  2009   COLONOSCOPY WITH PROPOFOL  N/A 02/15/2021   Procedure: COLONOSCOPY WITH PROPOFOL ;  Surgeon: Wilhelmenia Aloha Raddle., MD;  Location: [facility];  Service: Gastroenterology;  Laterality: N/A;  ultraslimscope    DILATION AND CURETTAGE OF UTERUS  1972   eye PRK vision correction Left 10/09/2013   eye socket re-formed Left 2014   FOOT NEUROMA SURGERY Right 2003   plus bunionectomy, right great toe   HAMMER TOE SURGERY Right 2005   Right foot correction hammertoe little toe   HEMORRHOID SURGERY  1967    HIP SURGERY Right 2014   right hip femur pin implanted   KNEE ARTHROSCOPY Left 2010   debridement left knee scar tissue   LAPAROSCOPIC OOPHERECTOMY     1986, 1990   LASIK  1998   NEUROMA SURGERY Right 2003   Right Foot Neuroma, plus Bunionectomy, Right Great Toe   POLYPECTOMY  02/15/2021   Procedure: POLYPECTOMY;  Surgeon: Wilhelmenia Aloha Raddle., MD;  Location: [facility];  Service: Gastroenterology;;   RECTOCELE REPAIR  1968   REPLACEMENT TOTAL KNEE Left 01/2008   REPLACEMENT TOTAL KNEE Right 08/2008   retinal pucker correction Left 07/14/2012   schwannoma tumor (benign) removal  2011   right rib   SIGMOIDOSCOPY     TONSILLECTOMY AND ADENOIDECTOMY     VENTRAL HERNIA REPAIR  1993   Patient Active Problem List   Diagnosis Date Noted   Precordial chest pain 10/14/2024   Arthralgia of right elbow 08/24/2022   Bilateral wrist pain 08/24/2022   Neck pain 08/24/2022   Pain of right hip joint 08/24/2022   Chronic pain of right knee 05/27/2021   OSA on CPAP 05/27/2021   LLQ pain  12/16/2020   History of diverticulitis 12/16/2020   Hematochezia 12/16/2020   Abnormal colonoscopy 12/16/2020   Acquired trigger finger of right index finger 10/26/2020   Other specified arthritis, right hand 10/26/2020   Diverticulitis of colon 08/15/2020   Dry eyes, bilateral 10/10/2019   Sleep-related hypoxia 08/05/2019   OSA (obstructive sleep apnea) 08/05/2019   PLMD (periodic limb movement disorder) 08/05/2019   Cerebellar 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) 05/06/2019   Excessive daytime sleepiness 05/06/2019   Loud snoring 05/06/2019   Ventral hernia without obstruction or gangrene 02/22/2019   Chronic throat clearing 01/18/2019   Nocturnal cough 01/18/2019   Family history of colon cancer in father 01/18/2019   Diverticulosis 01/18/2019   Gait abnormality 12/27/2018   Numbness 12/27/2018   Status post total bilateral knee replacement 11/30/2018   DDD (degenerative disc  disease), cervical 11/30/2018   Status post carpal tunnel release 11/30/2018   DDD (degenerative disc disease), lumbar 10/26/2018   Primary osteoarthritis of both hands 10/26/2018   Primary osteoarthritis of both feet 10/26/2018   Gastroesophageal reflux disease 08/27/2018   Bilateral impacted cerumen 08/21/2018   Sensorineural hearing loss (SNHL) of both ears 08/21/2018   Tinnitus, bilateral 08/21/2018   Blood in urine 04/17/2018   Secondary open-angle glaucoma of left eye, moderate stage 03/10/2018   Early stage 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 10/05/2017   Epiretinal membrane (ERM) of left eye 10/05/2017   Pseudophakia of both eyes 10/05/2017   Osteopenia 03/19/2017   Mixed hyperlipidemia 08/18/2016   Generalized osteoarthritis of multiple sites 08/18/2016   Peripheral neuropathic pain 08/18/2016   Overactive bladder 08/18/2016   Glaucoma 08/18/2016   Cervical disc disorder with radiculopathy of cervical region 08/17/2016   Carpal tunnel syndrome, right upper limb 08/17/2016   Abnormal finding on mammography 06/14/2016   Breast lump 05/06/2015    PCP: Yolande Toribio MATSU, MD  REFERRING PROVIDER:  Yolande Toribio MATSU, MD   Delane Lye, MD   REFERRING DIAG:  R26.81 (ICD-10-CM) - Unsteadiness on feet/Unsteady gait   M25.511 (ICD-10-CM) - Pain in right shoulder   THERAPY DIAG:  Stiffness of right shoulder, not elsewhere classified  Muscle weakness (generalized)  Other abnormalities of gait and mobility  Right shoulder pain, unspecified chronicity  Unsteadiness on feet  Rationale for Evaluation and Treatment: Rehabilitation  ONSET DATE: 2021 ; PT order 04/04/2024  SUBJECTIVE:   SUBJECTIVE STATEMENT: Pt had sleep study last night, which went well. Took prednisone last week for a sore throat and thinks this helped with her R shoulder pain. No pain at rest. Pt had a fall on Thursday last week in her garage when she tripped over a runner  getting out of her car. She states she was able to get to a shelf and use it to help her stand up. Landed on R knee but denies injury of increase in pain. Removed runner from the area.     PERTINENT HISTORY: Pt uses a SPC with walking.  Flexor tenotomy on 3 toes of R foot on 05/01/24--Pt reports she has no restrictions from MD. Arthritis, osteopenia, peripheral neuropathy Bilat TKR  PAIN:  R shoulder:  0/10    PRECAUTIONS: Fall and Other: peripheral neuropathy   WEIGHT BEARING RESTRICTIONS: No  FALLS:  Has patient fallen in last 6 months? Yes. Number of falls 1 (12/05/24)  LIVING ENVIRONMENT: Lives with: lives alone Lives in:  1 story home Stairs: No Has following equipment at home: Single point cane, shower chair, and Grab  bars  OCCUPATION:  Retired  PLOF: Independent  PATIENT GOALS: improved ability to pivot.  Improved walking on uneven terrain and performing stairs   OBJECTIVE:  Note: Objective measures were completed at Evaluation unless otherwise noted.  DIAGNOSTIC FINDINGS: Pt reports x rays were negative for fracture.   TREATMENT DATE:     12/11/24 -Pulleys flexion x , scaption x52min -Standing finger ladder walks flexion x10reps -STS 2x10 -seated toe raises 2x20 -seated marches 2x10 -standing HR 2x10 -hurdle step overs fwd and lateral x4hurdles x3 laps each with single UE support -SLS trials with fingertip support 10sec x5ea -Wlaking marching along rial with light UE support x4 laps -Sidestepping on airex beam x5laps -Gait in hall with [facility], cues for increased step length x15ft     12/04/24  Pulleys in flexion and scaption x 20 each Standing ladder walks in flexion x 10 reps Shelf reach x 10, 1# x10 reps Sidestepping on airex beam with UE support with SBA Tandem gait on airex beam with UE support with SBA Reciprocal stepping over 3 hurdles and onto airex with UE support on rail Standing DF one LE at a time 2x10 each LE Pt ascended and  descended 5 steps with 1 rail with a step to and step through gait and SBA  Seated wand ER approx 10 reps  Reviewed shoulder HEP.                                                                  PATIENT EDUCATION:  Education details:  HEP, rationale of interventions, POC, dx, exercise form, and relevant anatomy.  PT answered pt's questions.   Person educated: Patient Education method: Explanation, demonstration, verbal cues Education comprehension: verbalized understanding and needs further education, verbal cues required, returned demonstration  HOME EXERCISE PROGRAM:   Access Code: [email or web address] URL: [email or web address] Date: 09/17/2024 Prepared by: Mose Minerva    ASSESSMENT:  CLINICAL IMPRESSION:  No complaints of increased pain with shoulder focused exercises. She is very challenged by standing heel raises. Discussed continuing to perform these in water when she resumes her pool classes. With gait, she continues to demonstrates shortened step length, decreased heel contact, and decreased toe off. Worked on SLS trials to improve single leg stability which is likely affecting her gait quality. Pt tends to become unsteady with turning movement or head movements. She does note suffering from vertigo. Discussed PT intervention for this and to talk to PCP if this may help her.      OBJECTIVE IMPAIRMENTS: Abnormal gait, decreased activity tolerance, decreased balance, decreased endurance, decreased mobility, difficulty walking, and decreased strength.   ACTIVITY LIMITATIONS: standing, squatting, stairs, transfers, and locomotion level  PARTICIPATION LIMITATIONS: cleaning and community activity  PERSONAL FACTORS: 3+ comorbidities: Arthritis, bilat TKA, peripheral neuropathy are also affecting patient's functional outcome.   REHAB POTENTIAL: Good  CLINICAL DECISION MAKING: Stable/uncomplicated  EVALUATION COMPLEXITY: Low   GOALS:   SHORT TERM GOALS: Target  date:  06/05/2024   Pt will be independent and compliant with HEP for improved strength, balance, stability, and mobility.  Baseline: Goal status: GOAL MET 06/27/24  2.  Pt will demo improved time on TUG test by at least 5 seconds for improved mobility and reduced fall risk.   Baseline:  Goal status: ONGOING  12/15  3.  Pt will report at least a 25% improvement in her balance.   Baseline:  Goal status: GOAL MET  06/27/24  4.  Pt will report at least a 25% improvement in daily usage of R UE without adverse effects.  Goal Status:  GOAL MET  11/04/24 Target date: 10/09/2024  5.  Pt will demo at least a 12-15 deg increase in R shoulder flexion AROM and a 5-7 deg increase in R elbow extension AROM for improved performance of reaching and performance of daily activities.  Goal Status:  GOAL MET  11/04/24 Target date: 10/16/2024      LONG TERM GOALS: Target date:  12/30/2024   Pt will score at least 44/56 on the Berg Balance Assessment for improved balance and stability with daily tasks and mobility.  Baseline:  35/56 initial,  40/56 on 11/6 Goal status: NOT MET   2.  Pt will be able to load and unload her dishwasher with good stability and good confidence.  Baseline:  Goal status: GOAL MET  with holding onto counter  9/4  3.  Pt will demo improved bilat LE strength to 4+/5 in hip flexion, 5/5 in knee extension, 4-/5 in R ankle DF, and a 5# increase in L hip abduction for improved performance of functional mobility and performance of household chores.  Baseline:  Goal status:  ONGOING  4.  Pt will be able to ascend and descend stairs with a reciprocal gait with the rail.   Baseline:  Goal status: PROGRESSING  9/4  5.  Pt will report she is able to perform her normal ambulation with cane with good stability and confidence and without LOB. Baseline:  Goal status:ONGOING  6.  Pt will report she is able to perform her normal reaching activities including reaching into an overhead  cabinet without significant difficulty and pain.   Goal status:  NOT MET  7.  Pt will be able to dress without difficulty.   Goal status:  NOT MET    8.  Pt will be able to perform her ADLs without significant shoulder pain and difficulty.   Goal status:  ONGOING  9.  Pt will report at least a 50% improvement in balance with turning her head and also closing heavy doors.   Goal status:  ONGOING   PLAN:  PT FREQUENCY: 2x/wk  PT DURATION: 6 weeks  PLANNED INTERVENTIONS: 02835- PT Re-evaluation, 97750- Physical Performance Testing, 97110-Therapeutic exercises, 97530- Therapeutic activity, [id number]- Neuromuscular re-education, 97535- Self Care, [id number]- Manual therapy, [phone]- Gait training, [phone]- Aquatic Therapy, [phone]- Electrical stimulation (unattended), Patient/Family education, Balance training, Stair training, Taping, DME instructions, Cryotherapy, and Moist heat  PLAN FOR NEXT SESSION: Work on [employer].  LE strengthening, gait, and balance activities.  Work on step ups and step downs.  Cont with shoulder ROM.    Asberry Rodes, PTA  12/24/24 2:57 PM                                                      OUTPATIENT PHYSICAL THERAPY TREATMENT           Patient Name: Hailey Perkins MRN: [id number] DOB:02/18/1935, 89 y.o., female Today's Date: 12/24/2024  END OF SESSION:  PT End of Session - 12/24/24 1304     Visit Number 40    Number of Visits  46    Date for Recertification  12/30/24    Authorization Type BCBS MCR    PT Start Time 1302    PT Stop Time 1345    PT Time Calculation (min) 43 min    Activity Tolerance Patient tolerated treatment well    Behavior During Therapy WFL for tasks assessed/performed                   Past Medical History:  Diagnosis Date   Aortic atherosclerosis    Arthritis    Cataract 2010   bilateral eyes   Chicken pox    Cholelithiasis    Diverticulitis    Diverticulosis     GERD (gastroesophageal reflux disease)    Glaucoma    Hiatal hernia    History of frequent urinary tract infections    Hyperlipidemia    Osteopenia    Peripheral neuropathy    Sleep apnea    wears a CPAP   Urine incontinence    Past Surgical History:  Procedure Laterality Date   ABDOMINAL HYSTERECTOMY  1977   ANAL FISTULECTOMY  1985   ANKLE FRACTURE SURGERY Right 2014   APPENDECTOMY     BIOPSY  02/15/2021   Procedure: BIOPSY;  Surgeon: Wilhelmenia Aloha Raddle., MD;  Location: [facility];  Service: Gastroenterology;;   BREAST BIOPSY Bilateral 10+ yrs ago   BENIGN   BUNIONECTOMY Left 1975   Left Toe   CATARACT EXTRACTION, BILATERAL  2009   COLONOSCOPY WITH PROPOFOL  N/A 02/15/2021   Procedure: COLONOSCOPY WITH PROPOFOL ;  Surgeon: Wilhelmenia Aloha Raddle., MD;  Location: [facility];  Service: Gastroenterology;  Laterality: N/A;  ultraslimscope    DILATION AND CURETTAGE OF UTERUS  1972   eye PRK vision correction Left 10/09/2013   eye socket re-formed Left 2014   FOOT NEUROMA SURGERY Right 2003   plus bunionectomy, right great toe   HAMMER TOE SURGERY Right 2005   Right foot correction hammertoe little toe   HEMORRHOID SURGERY  1967   HIP SURGERY Right 2014   right hip femur pin implanted   KNEE ARTHROSCOPY Left 2010   debridement left knee scar tissue   LAPAROSCOPIC OOPHERECTOMY     1986, 1990   LASIK  1998   NEUROMA SURGERY Right 2003   Right Foot Neuroma, plus Bunionectomy, Right Great Toe   POLYPECTOMY  02/15/2021   Procedure: POLYPECTOMY;  Surgeon: Wilhelmenia Aloha Raddle., MD;  Location: [facility];  Service: Gastroenterology;;   RECTOCELE REPAIR  1968   REPLACEMENT TOTAL KNEE Left 01/2008   REPLACEMENT TOTAL KNEE Right 08/2008   retinal pucker correction Left 07/14/2012   schwannoma tumor (benign) removal  2011   right rib   SIGMOIDOSCOPY     TONSILLECTOMY AND ADENOIDECTOMY     VENTRAL HERNIA REPAIR  1993   Patient Active Problem List   Diagnosis Date  Noted   Precordial chest pain 10/14/2024   Arthralgia of right elbow 08/24/2022   Bilateral wrist pain 08/24/2022   Neck pain 08/24/2022   Pain of right hip joint 08/24/2022   Chronic pain of right knee 05/27/2021   OSA on CPAP 05/27/2021   LLQ pain 12/16/2020   History of diverticulitis 12/16/2020   Hematochezia 12/16/2020   Abnormal colonoscopy 12/16/2020   Acquired trigger finger of right index finger 10/26/2020   Other specified arthritis, right hand 10/26/2020   Diverticulitis of colon 08/15/2020   Dry eyes, bilateral 10/10/2019   Sleep-related hypoxia 08/05/2019   OSA (obstructive sleep apnea) 08/05/2019  PLMD (periodic limb movement disorder) 08/05/2019   Cerebellar 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) 05/06/2019   Excessive daytime sleepiness 05/06/2019   Loud snoring 05/06/2019   Ventral hernia without obstruction or gangrene 02/22/2019   Chronic throat clearing 01/18/2019   Nocturnal cough 01/18/2019   Family history of colon cancer in father 01/18/2019   Diverticulosis 01/18/2019   Gait abnormality 12/27/2018   Numbness 12/27/2018   Status post total bilateral knee replacement 11/30/2018   DDD (degenerative disc disease), cervical 11/30/2018   Status post carpal tunnel release 11/30/2018   DDD (degenerative disc disease), lumbar 10/26/2018   Primary osteoarthritis of both hands 10/26/2018   Primary osteoarthritis of both feet 10/26/2018   Gastroesophageal reflux disease 08/27/2018   Bilateral impacted cerumen 08/21/2018   Sensorineural hearing loss (SNHL) of both ears 08/21/2018   Tinnitus, bilateral 08/21/2018   Blood in urine 04/17/2018   Secondary open-angle glaucoma of left eye, moderate stage 03/10/2018   Early stage 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 10/05/2017   Epiretinal membrane (ERM) of left eye 10/05/2017   Pseudophakia of both eyes 10/05/2017   Osteopenia 03/19/2017   Mixed hyperlipidemia 08/18/2016   Generalized  osteoarthritis of multiple sites 08/18/2016   Peripheral neuropathic pain 08/18/2016   Overactive bladder 08/18/2016   Glaucoma 08/18/2016   Cervical disc disorder with radiculopathy of cervical region 08/17/2016   Carpal tunnel syndrome, right upper limb 08/17/2016   Abnormal finding on mammography 06/14/2016   Breast lump 05/06/2015    PCP: Yolande Toribio MATSU, MD  REFERRING PROVIDER:  Yolande Toribio MATSU, MD   Delane Lye, MD   REFERRING DIAG:  R26.81 (ICD-10-CM) - Unsteadiness on feet/Unsteady gait   M25.511 (ICD-10-CM) - Pain in right shoulder   THERAPY DIAG:  Stiffness of right shoulder, not elsewhere classified  Muscle weakness (generalized)  Other abnormalities of gait and mobility  Right shoulder pain, unspecified chronicity  Unsteadiness on feet  Rationale for Evaluation and Treatment: Rehabilitation  ONSET DATE: 2021 ; PT order 04/04/2024  SUBJECTIVE:   SUBJECTIVE STATEMENT: Pt reports no more falls. She saw MD on Tuesday and had x ray for R shoulder. She still has sharp pains in certain movements. Still waiting on xray results. Moving arm straight up and down feels okay.     PERTINENT HISTORY: Pt uses a SPC with walking.  Flexor tenotomy on 3 toes of R foot on 05/01/24--Pt reports she has no restrictions from MD. Arthritis, osteopenia, peripheral neuropathy Bilat TKR  PAIN:  R shoulder:  0/10    PRECAUTIONS: Fall and Other: peripheral neuropathy   WEIGHT BEARING RESTRICTIONS: No  FALLS:  Has patient fallen in last 6 months? Yes. Number of falls 1 (12/05/24)  LIVING ENVIRONMENT: Lives with: lives alone Lives in:  1 story home Stairs: No Has following equipment at home: Single point cane, shower chair, and Grab bars  OCCUPATION:  Retired  PLOF: Independent  PATIENT GOALS: improved ability to pivot.  Improved walking on uneven terrain and performing stairs   OBJECTIVE:  Note: Objective measures were completed at Evaluation unless  otherwise noted.  DIAGNOSTIC FINDINGS: Pt reports x rays were negative for fracture.   TREATMENT DATE:    12/24/24 Pulleys x74min flexion, x49min scaption -Steger ladder flexion x10 -standing HR 2x10 -walking marches with rail support x2 laps -side stepping along rail x2 laps -retro walking along rail x2 laps (cues for longer steps) -marching on airex 2x10 SBA -side stepping over airex pad x10 -HSC machine 25# 2x10 -leg  extension machine 10# 2x10    12/19/24 Pulleys x36min flexion, x66min scaption -Keng ladder flexion x10 -standing HR 2x10 -walking marches with rail support x2 laps -Exaggerated heel contact and toe off gait with rail x2 laps -side stepping along rail x2 laps -retro walking along rail x2 laps (cues for longer steps) -squats at rail x10 -HSC machine 25# 2x10 -leg extension machine 10# 2x10     12/11/24 -Pulleys flexion x , scaption x49min -Standing finger ladder walks flexion x10reps -STS 2x10 -seated toe raises 2x20 -seated marches 2x10 -standing HR 2x10 -hurdle step overs fwd and lateral x4hurdles x3 laps each with single UE support -SLS trials with fingertip support 10sec x5ea -Wlaking marching along rial with light UE support x4 laps -Sidestepping on airex beam x5laps -Gait in hall with [facility], cues for increased step length x14ft     12/04/24  Pulleys in flexion and scaption x 20 each Standing ladder walks in flexion x 10 reps Shelf reach x 10, 1# x10 reps Sidestepping on airex beam with UE support with SBA Tandem gait on airex beam with UE support with SBA Reciprocal stepping over 3 hurdles and onto airex with UE support on rail Standing DF one LE at a time 2x10 each LE Pt ascended and descended 5 steps with 1 rail with a step to and step through gait and SBA  Seated wand ER approx 10 reps  Reviewed shoulder HEP.                                                                  PATIENT EDUCATION:  Education details:  HEP, rationale of  interventions, POC, dx, exercise form, and relevant anatomy.  PT answered pt's questions.   Person educated: Patient Education method: Explanation, demonstration, verbal cues Education comprehension: verbalized understanding and needs further education, verbal cues required, returned demonstration  HOME EXERCISE PROGRAM:   Access Code: [email or web address] URL: [email or web address] Date: 09/17/2024 Prepared by: Mose Minerva    ASSESSMENT:  CLINICAL IMPRESSION:  Good tolerance for PT interventions today. Able to incorporate some gym machine strengthening as pt would like to return to gym program in the future. She tolerated this well, but was education on DOMS and expectations. Worked on gait and NMC interventions to improve stability and control with walking. Decreased foot slap noted with heel contact cuing. Pt had good carryover of cuing for increased step length with gait using SPC in clinic. Will continue to progress as tolerated.      OBJECTIVE IMPAIRMENTS: Abnormal gait, decreased activity tolerance, decreased balance, decreased endurance, decreased mobility, difficulty walking, and decreased strength.   ACTIVITY LIMITATIONS: standing, squatting, stairs, transfers, and locomotion level  PARTICIPATION LIMITATIONS: cleaning and community activity  PERSONAL FACTORS: 3+ comorbidities: Arthritis, bilat TKA, peripheral neuropathy are also affecting patient's functional outcome.   REHAB POTENTIAL: Good  CLINICAL DECISION MAKING: Stable/uncomplicated  EVALUATION COMPLEXITY: Low   GOALS:   SHORT TERM GOALS: Target date:  06/05/2024   Pt will be independent and compliant with HEP for improved strength, balance, stability, and mobility.  Baseline: Goal status: GOAL MET 06/27/24  2.  Pt will demo improved time on TUG test by at least 5 seconds for improved mobility and reduced fall risk.   Baseline:  Goal status: ONGOING  12/15  3.  Pt will report at least a 25%  improvement in her balance.   Baseline:  Goal status: GOAL MET  06/27/24  4.  Pt will report at least a 25% improvement in daily usage of R UE without adverse effects.  Goal Status:  GOAL MET  11/04/24 Target date: 10/09/2024  5.  Pt will demo at least a 12-15 deg increase in R shoulder flexion AROM and a 5-7 deg increase in R elbow extension AROM for improved performance of reaching and performance of daily activities.  Goal Status:  GOAL MET  11/04/24 Target date: 10/16/2024      LONG TERM GOALS: Target date:  12/30/2024   Pt will score at least 44/56 on the Berg Balance Assessment for improved balance and stability with daily tasks and mobility.  Baseline:  35/56 initial,  40/56 on 11/6 Goal status: NOT MET   2.  Pt will be able to load and unload her dishwasher with good stability and good confidence.  Baseline:  Goal status: GOAL MET  with holding onto counter  9/4  3.  Pt will demo improved bilat LE strength to 4+/5 in hip flexion, 5/5 in knee extension, 4-/5 in R ankle DF, and a 5# increase in L hip abduction for improved performance of functional mobility and performance of household chores.  Baseline:  Goal status:  ONGOING  4.  Pt will be able to ascend and descend stairs with a reciprocal gait with the rail.   Baseline:  Goal status: PROGRESSING  9/4  5.  Pt will report she is able to perform her normal ambulation with cane with good stability and confidence and without LOB. Baseline:  Goal status:ONGOING  6.  Pt will report she is able to perform her normal reaching activities including reaching into an overhead cabinet without significant difficulty and pain.   Goal status:  NOT MET  7.  Pt will be able to dress without difficulty.   Goal status:  NOT MET    8.  Pt will be able to perform her ADLs without significant shoulder pain and difficulty.   Goal status:  ONGOING  9.  Pt will report at least a 50% improvement in balance with turning her head and also  closing heavy doors.   Goal status:  ONGOING   PLAN:  PT FREQUENCY: 2x/wk  PT DURATION: 6 weeks  PLANNED INTERVENTIONS: 02835- PT Re-evaluation, 97750- Physical Performance Testing, 97110-Therapeutic exercises, 97530- Therapeutic activity, [id number]- Neuromuscular re-education, 97535- Self Care, 02859- Manual therapy, [phone]- Gait training, [phone]- Aquatic Therapy, [phone]- Electrical stimulation (unattended), Patient/Family education, Balance training, Stair training, Taping, DME instructions, Cryotherapy, and Moist heat  PLAN FOR NEXT SESSION: Work on [employer].  LE strengthening, gait, and balance activities.  Work on step ups and step downs.  Cont with shoulder ROM.    Asberry Rodes, PTA  12/24/24 2:57 PM                                                 "

## 2024-12-24 NOTE — Therapy (Deleted)
 "      OUTPATIENT PHYSICAL THERAPY TREATMENT           Patient Name: Hailey Perkins MRN: [id number] DOB:12/02/1935, 89 y.o., female Today's Date: 12/24/2024  END OF SESSION:             Past Medical History:  Diagnosis Date   Aortic atherosclerosis    Arthritis    Cataract 2010   bilateral eyes   Chicken pox    Cholelithiasis    Diverticulitis    Diverticulosis    GERD (gastroesophageal reflux disease)    Glaucoma    Hiatal hernia    History of frequent urinary tract infections    Hyperlipidemia    Osteopenia    Peripheral neuropathy    Sleep apnea    wears a CPAP   Urine incontinence    Past Surgical History:  Procedure Laterality Date   ABDOMINAL HYSTERECTOMY  1977   ANAL FISTULECTOMY  1985   ANKLE FRACTURE SURGERY Right 2014   APPENDECTOMY     BIOPSY  02/15/2021   Procedure: BIOPSY;  Surgeon: Wilhelmenia Aloha Raddle., MD;  Location: [facility];  Service: Gastroenterology;;   BREAST BIOPSY Bilateral 10+ yrs ago   BENIGN   BUNIONECTOMY Left 1975   Left Toe   CATARACT EXTRACTION, BILATERAL  2009   COLONOSCOPY WITH PROPOFOL  N/A 02/15/2021   Procedure: COLONOSCOPY WITH PROPOFOL ;  Surgeon: Wilhelmenia Aloha Raddle., MD;  Location: [facility];  Service: Gastroenterology;  Laterality: N/A;  ultraslimscope    DILATION AND CURETTAGE OF UTERUS  1972   eye PRK vision correction Left 10/09/2013   eye socket re-formed Left 2014   FOOT NEUROMA SURGERY Right 2003   plus bunionectomy, right great toe   HAMMER TOE SURGERY Right 2005   Right foot correction hammertoe little toe   HEMORRHOID SURGERY  1967   HIP SURGERY Right 2014   right hip femur pin implanted   KNEE ARTHROSCOPY Left 2010   debridement left knee scar tissue   LAPAROSCOPIC OOPHERECTOMY     1986, 1990   LASIK  1998   NEUROMA SURGERY Right 2003   Right Foot Neuroma, plus Bunionectomy, Right Great Toe   POLYPECTOMY  02/15/2021   Procedure: POLYPECTOMY;  Surgeon: Wilhelmenia Aloha Raddle., MD;  Location: [facility];  Service: Gastroenterology;;   RECTOCELE REPAIR  1968   REPLACEMENT TOTAL KNEE Left 01/2008   REPLACEMENT TOTAL KNEE Right 08/2008   retinal pucker correction Left 07/14/2012   schwannoma tumor (benign) removal  2011   right rib   SIGMOIDOSCOPY     TONSILLECTOMY AND ADENOIDECTOMY     VENTRAL HERNIA REPAIR  1993   Patient Active Problem List   Diagnosis Date Noted   Precordial chest pain 10/14/2024   Arthralgia of right elbow 08/24/2022   Bilateral wrist pain 08/24/2022   Neck pain 08/24/2022   Pain of right hip joint 08/24/2022   Chronic pain of right knee 05/27/2021   OSA on CPAP 05/27/2021   LLQ pain 12/16/2020   History of diverticulitis 12/16/2020   Hematochezia 12/16/2020   Abnormal colonoscopy 12/16/2020   Acquired trigger finger of right index finger 10/26/2020   Other specified arthritis, right hand 10/26/2020   Diverticulitis of colon 08/15/2020   Dry eyes, bilateral 10/10/2019   Sleep-related hypoxia 08/05/2019   OSA (obstructive sleep apnea) 08/05/2019   PLMD (periodic limb movement disorder) 08/05/2019   Cerebellar 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) 05/06/2019   Excessive daytime sleepiness 05/06/2019   Loud  snoring 05/06/2019   Ventral hernia without obstruction or gangrene 02/22/2019   Chronic throat clearing 01/18/2019   Nocturnal cough 01/18/2019   Family history of colon cancer in father 01/18/2019   Diverticulosis 01/18/2019   Gait abnormality 12/27/2018   Numbness 12/27/2018   Status post total bilateral knee replacement 11/30/2018   DDD (degenerative disc disease), cervical 11/30/2018   Status post carpal tunnel release 11/30/2018   DDD (degenerative disc disease), lumbar 10/26/2018   Primary osteoarthritis of both hands 10/26/2018   Primary osteoarthritis of both feet 10/26/2018   Gastroesophageal reflux disease 08/27/2018   Bilateral impacted cerumen 08/21/2018   Sensorineural hearing loss (SNHL) of  both ears 08/21/2018   Tinnitus, bilateral 08/21/2018   Blood in urine 04/17/2018   Secondary open-angle glaucoma of left eye, moderate stage 03/10/2018   Early stage 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 10/05/2017   Epiretinal membrane (ERM) of left eye 10/05/2017   Pseudophakia of both eyes 10/05/2017   Osteopenia 03/19/2017   Mixed hyperlipidemia 08/18/2016   Generalized osteoarthritis of multiple sites 08/18/2016   Peripheral neuropathic pain 08/18/2016   Overactive bladder 08/18/2016   Glaucoma 08/18/2016   Cervical disc disorder with radiculopathy of cervical region 08/17/2016   Carpal tunnel syndrome, right upper limb 08/17/2016   Abnormal finding on mammography 06/14/2016   Breast lump 05/06/2015    PCP: Yolande Toribio MATSU, MD  REFERRING PROVIDER:  Yolande Toribio MATSU, MD   Delane Lye, MD   REFERRING DIAG:  R26.81 (ICD-10-CM) - Unsteadiness on feet/Unsteady gait   M25.511 (ICD-10-CM) - Pain in right shoulder   THERAPY DIAG:  No diagnosis found.  Rationale for Evaluation and Treatment: Rehabilitation  ONSET DATE: 2021 ; PT order 04/04/2024  SUBJECTIVE:   SUBJECTIVE STATEMENT: Pt had sleep study last night, which went well. Took prednisone last week for a sore throat and thinks this helped with her R shoulder pain. No pain at rest. Pt had a fall on Thursday last week in her garage when she tripped over a runner getting out of her car. She states she was able to get to a shelf and use it to help her stand up. Landed on R knee but denies injury of increase in pain. Removed runner from the area.     PERTINENT HISTORY: Pt uses a SPC with walking.  Flexor tenotomy on 3 toes of R foot on 05/01/24--Pt reports she has no restrictions from MD. Arthritis, osteopenia, peripheral neuropathy Bilat TKR  PAIN:  R shoulder:  0/10    PRECAUTIONS: Fall and Other: peripheral neuropathy   WEIGHT BEARING RESTRICTIONS: No  FALLS:  Has patient fallen  in last 6 months? Yes. Number of falls 1 (12/05/24)  LIVING ENVIRONMENT: Lives with: lives alone Lives in:  1 story home Stairs: No Has following equipment at home: Single point cane, shower chair, and Grab bars  OCCUPATION:  Retired  PLOF: Independent  PATIENT GOALS: improved ability to pivot.  Improved walking on uneven terrain and performing stairs   OBJECTIVE:  Note: Objective measures were completed at Evaluation unless otherwise noted.  DIAGNOSTIC FINDINGS: Pt reports x rays were negative for fracture.   TREATMENT DATE:     12/11/24 -Pulleys flexion x , scaption x88min -Standing finger ladder walks flexion x10reps -STS 2x10 -seated toe raises 2x20 -seated marches 2x10 -standing HR 2x10 -hurdle step overs fwd and lateral x4hurdles x3 laps each with single UE support -SLS trials with fingertip support 10sec x5ea -Wlaking marching along rial with light UE  support x4 laps -Sidestepping on airex beam x5laps -Gait in hall with [facility], cues for increased step length x146ft     12/04/24  Pulleys in flexion and scaption x 20 each Standing ladder walks in flexion x 10 reps Shelf reach x 10, 1# x10 reps Sidestepping on airex beam with UE support with SBA Tandem gait on airex beam with UE support with SBA Reciprocal stepping over 3 hurdles and onto airex with UE support on rail Standing DF one LE at a time 2x10 each LE Pt ascended and descended 5 steps with 1 rail with a step to and step through gait and SBA  Seated wand ER approx 10 reps  Reviewed shoulder HEP.                                                                  PATIENT EDUCATION:  Education details:  HEP, rationale of interventions, POC, dx, exercise form, and relevant anatomy.  PT answered pt's questions.   Person educated: Patient Education method: Explanation, demonstration, verbal cues Education comprehension: verbalized understanding and needs further education, verbal cues required, returned  demonstration  HOME EXERCISE PROGRAM:   Access Code: [email or web address] URL: [email or web address] Date: 09/17/2024 Prepared by: Mose Minerva    ASSESSMENT:  CLINICAL IMPRESSION:  No complaints of increased pain with shoulder focused exercises. She is very challenged by standing heel raises. Discussed continuing to perform these in water when she resumes her pool classes. With gait, she continues to demonstrates shortened step length, decreased heel contact, and decreased toe off. Worked on SLS trials to improve single leg stability which is likely affecting her gait quality. Pt tends to become unsteady with turning movement or head movements. She does note suffering from vertigo. Discussed PT intervention for this and to talk to PCP if this may help her.      OBJECTIVE IMPAIRMENTS: Abnormal gait, decreased activity tolerance, decreased balance, decreased endurance, decreased mobility, difficulty walking, and decreased strength.   ACTIVITY LIMITATIONS: standing, squatting, stairs, transfers, and locomotion level  PARTICIPATION LIMITATIONS: cleaning and community activity  PERSONAL FACTORS: 3+ comorbidities: Arthritis, bilat TKA, peripheral neuropathy are also affecting patient's functional outcome.   REHAB POTENTIAL: Good  CLINICAL DECISION MAKING: Stable/uncomplicated  EVALUATION COMPLEXITY: Low   GOALS:   SHORT TERM GOALS: Target date:  06/05/2024   Pt will be independent and compliant with HEP for improved strength, balance, stability, and mobility.  Baseline: Goal status: GOAL MET 06/27/24  2.  Pt will demo improved time on TUG test by at least 5 seconds for improved mobility and reduced fall risk.   Baseline:  Goal status: ONGOING  12/15  3.  Pt will report at least a 25% improvement in her balance.   Baseline:  Goal status: GOAL MET  06/27/24  4.  Pt will report at least a 25% improvement in daily usage of R UE without adverse effects.  Goal Status:  GOAL  MET  11/04/24 Target date: 10/09/2024  5.  Pt will demo at least a 12-15 deg increase in R shoulder flexion AROM and a 5-7 deg increase in R elbow extension AROM for improved performance of reaching and performance of daily activities.  Goal Status:  GOAL MET  11/04/24 Target date: 10/16/2024      LONG  TERM GOALS: Target date:  12/30/2024   Pt will score at least 44/56 on the Berg Balance Assessment for improved balance and stability with daily tasks and mobility.  Baseline:  35/56 initial,  40/56 on 11/6 Goal status: NOT MET   2.  Pt will be able to load and unload her dishwasher with good stability and good confidence.  Baseline:  Goal status: GOAL MET  with holding onto counter  9/4  3.  Pt will demo improved bilat LE strength to 4+/5 in hip flexion, 5/5 in knee extension, 4-/5 in R ankle DF, and a 5# increase in L hip abduction for improved performance of functional mobility and performance of household chores.  Baseline:  Goal status:  ONGOING  4.  Pt will be able to ascend and descend stairs with a reciprocal gait with the rail.   Baseline:  Goal status: PROGRESSING  9/4  5.  Pt will report she is able to perform her normal ambulation with cane with good stability and confidence and without LOB. Baseline:  Goal status:ONGOING  6.  Pt will report she is able to perform her normal reaching activities including reaching into an overhead cabinet without significant difficulty and pain.   Goal status:  NOT MET  7.  Pt will be able to dress without difficulty.   Goal status:  NOT MET    8.  Pt will be able to perform her ADLs without significant shoulder pain and difficulty.   Goal status:  ONGOING  9.  Pt will report at least a 50% improvement in balance with turning her head and also closing heavy doors.   Goal status:  ONGOING   PLAN:  PT FREQUENCY: 2x/wk  PT DURATION: 6 weeks  PLANNED INTERVENTIONS: 02835- PT Re-evaluation, 97750- Physical Performance Testing,  97110-Therapeutic exercises, 97530- Therapeutic activity, [id number]- Neuromuscular re-education, 97535- Self Care, 02859- Manual therapy, [phone]- Gait training, [phone]- Aquatic Therapy, [phone]- Electrical stimulation (unattended), Patient/Family education, Balance training, Stair training, Taping, DME instructions, Cryotherapy, and Moist heat  PLAN FOR NEXT SESSION: Work on [employer].  LE strengthening, gait, and balance activities.  Work on step ups and step downs.  Cont with shoulder ROM.    Asberry Rodes, PTA  12/24/24 1:01 PM                                                      OUTPATIENT PHYSICAL THERAPY TREATMENT           Patient Name: Hailey Perkins MRN: [id number] DOB:[birth year], 89 y.o., female Today's Date: 12/24/2024  END OF SESSION:             Past Medical History:  Diagnosis Date   Aortic atherosclerosis    Arthritis    Cataract 2010   bilateral eyes   Chicken pox    Cholelithiasis    Diverticulitis    Diverticulosis    GERD (gastroesophageal reflux disease)    Glaucoma    Hiatal hernia    History of frequent urinary tract infections    Hyperlipidemia    Osteopenia    Peripheral neuropathy    Sleep apnea    wears a CPAP   Urine incontinence    Past Surgical History:  Procedure Laterality Date   ABDOMINAL HYSTERECTOMY  1977   ANAL FISTULECTOMY  1985   ANKLE FRACTURE SURGERY  Right 2014   APPENDECTOMY     BIOPSY  02/15/2021   Procedure: BIOPSY;  Surgeon: Wilhelmenia Aloha Raddle., MD;  Location: [facility];  Service: Gastroenterology;;   BREAST BIOPSY Bilateral 10+ yrs ago   BENIGN   BUNIONECTOMY Left 1975   Left Toe   CATARACT EXTRACTION, BILATERAL  2009   COLONOSCOPY WITH PROPOFOL  N/A 02/15/2021   Procedure: COLONOSCOPY WITH PROPOFOL ;  Surgeon: Wilhelmenia Aloha Raddle., MD;  Location: [facility];  Service: Gastroenterology;  Laterality: N/A;  ultraslimscope    DILATION AND CURETTAGE OF UTERUS   1972   eye PRK vision correction Left 10/09/2013   eye socket re-formed Left 2014   FOOT NEUROMA SURGERY Right 2003   plus bunionectomy, right great toe   HAMMER TOE SURGERY Right 2005   Right foot correction hammertoe little toe   HEMORRHOID SURGERY  1967   HIP SURGERY Right 2014   right hip femur pin implanted   KNEE ARTHROSCOPY Left 2010   debridement left knee scar tissue   LAPAROSCOPIC OOPHERECTOMY     1986, 1990   LASIK  1998   NEUROMA SURGERY Right 2003   Right Foot Neuroma, plus Bunionectomy, Right Great Toe   POLYPECTOMY  02/15/2021   Procedure: POLYPECTOMY;  Surgeon: Wilhelmenia Aloha Raddle., MD;  Location: [facility];  Service: Gastroenterology;;   RECTOCELE REPAIR  1968   REPLACEMENT TOTAL KNEE Left 01/2008   REPLACEMENT TOTAL KNEE Right 08/2008   retinal pucker correction Left 07/14/2012   schwannoma tumor (benign) removal  2011   right rib   SIGMOIDOSCOPY     TONSILLECTOMY AND ADENOIDECTOMY     VENTRAL HERNIA REPAIR  1993   Patient Active Problem List   Diagnosis Date Noted   Precordial chest pain 10/14/2024   Arthralgia of right elbow 08/24/2022   Bilateral wrist pain 08/24/2022   Neck pain 08/24/2022   Pain of right hip joint 08/24/2022   Chronic pain of right knee 05/27/2021   OSA on CPAP 05/27/2021   LLQ pain 12/16/2020   History of diverticulitis 12/16/2020   Hematochezia 12/16/2020   Abnormal colonoscopy 12/16/2020   Acquired trigger finger of right index finger 10/26/2020   Other specified arthritis, right hand 10/26/2020   Diverticulitis of colon 08/15/2020   Dry eyes, bilateral 10/10/2019   Sleep-related hypoxia 08/05/2019   OSA (obstructive sleep apnea) 08/05/2019   PLMD (periodic limb movement disorder) 08/05/2019   Cerebellar 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) 05/06/2019   Excessive daytime sleepiness 05/06/2019   Loud snoring 05/06/2019   Ventral hernia without obstruction or gangrene 02/22/2019   Chronic throat clearing  01/18/2019   Nocturnal cough 01/18/2019   Family history of colon cancer in father 01/18/2019   Diverticulosis 01/18/2019   Gait abnormality 12/27/2018   Numbness 12/27/2018   Status post total bilateral knee replacement 11/30/2018   DDD (degenerative disc disease), cervical 11/30/2018   Status post carpal tunnel release 11/30/2018   DDD (degenerative disc disease), lumbar 10/26/2018   Primary osteoarthritis of both hands 10/26/2018   Primary osteoarthritis of both feet 10/26/2018   Gastroesophageal reflux disease 08/27/2018   Bilateral impacted cerumen 08/21/2018   Sensorineural hearing loss (SNHL) of both ears 08/21/2018   Tinnitus, bilateral 08/21/2018   Blood in urine 04/17/2018   Secondary open-angle glaucoma of left eye, moderate stage 03/10/2018   Early stage 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 10/05/2017   Epiretinal membrane (ERM) of left eye 10/05/2017   Pseudophakia of both eyes 10/05/2017  Osteopenia 03/19/2017   Mixed hyperlipidemia 08/18/2016   Generalized osteoarthritis of multiple sites 08/18/2016   Peripheral neuropathic pain 08/18/2016   Overactive bladder 08/18/2016   Glaucoma 08/18/2016   Cervical disc disorder with radiculopathy of cervical region 08/17/2016   Carpal tunnel syndrome, right upper limb 08/17/2016   Abnormal finding on mammography 06/14/2016   Breast lump 05/06/2015    PCP: Yolande Toribio MATSU, MD  REFERRING PROVIDER:  Yolande Toribio MATSU, MD   Delane Lye, MD   REFERRING DIAG:  R26.81 (ICD-10-CM) - Unsteadiness on feet/Unsteady gait   M25.511 (ICD-10-CM) - Pain in right shoulder   THERAPY DIAG:  No diagnosis found.  Rationale for Evaluation and Treatment: Rehabilitation  ONSET DATE: 2021 ; PT order 04/04/2024  SUBJECTIVE:   SUBJECTIVE STATEMENT: Pt reports no more falls. She saw MD on Tuesday and had x ray for R shoulder. She still has sharp pains in certain movements. Still waiting on xray results.  Moving arm straight up and down feels okay.     PERTINENT HISTORY: Pt uses a SPC with walking.  Flexor tenotomy on 3 toes of R foot on 05/01/24--Pt reports she has no restrictions from MD. Arthritis, osteopenia, peripheral neuropathy Bilat TKR  PAIN:  R shoulder:  0/10    PRECAUTIONS: Fall and Other: peripheral neuropathy   WEIGHT BEARING RESTRICTIONS: No  FALLS:  Has patient fallen in last 6 months? Yes. Number of falls 1 (12/05/24)  LIVING ENVIRONMENT: Lives with: lives alone Lives in:  1 story home Stairs: No Has following equipment at home: Single point cane, shower chair, and Grab bars  OCCUPATION:  Retired  PLOF: Independent  PATIENT GOALS: improved ability to pivot.  Improved walking on uneven terrain and performing stairs   OBJECTIVE:  Note: Objective measures were completed at Evaluation unless otherwise noted.  DIAGNOSTIC FINDINGS: Pt reports x rays were negative for fracture.   TREATMENT DATE:     12/19/24 Pulleys x29min flexion, x38min scaption -Dhingra ladder flexion x10 -standing HR 2x10 -walking marches with rail support x2 laps -Exaggerated heel contact and toe off gait with rail x2 laps -side stepping along rail x2 laps -retro walking along rail x2 laps (cues for longer steps) -squats at rail x10 -HSC machine 25# 2x10 -leg extension machine 10# 2x10     12/11/24 -Pulleys flexion x , scaption x59min -Standing finger ladder walks flexion x10reps -STS 2x10 -seated toe raises 2x20 -seated marches 2x10 -standing HR 2x10 -hurdle step overs fwd and lateral x4hurdles x3 laps each with single UE support -SLS trials with fingertip support 10sec x5ea -Wlaking marching along rial with light UE support x4 laps -Sidestepping on airex beam x5laps -Gait in hall with [facility], cues for increased step length x154ft     12/04/24  Pulleys in flexion and scaption x 20 each Standing ladder walks in flexion x 10 reps Shelf reach x 10, 1# x10  reps Sidestepping on airex beam with UE support with SBA Tandem gait on airex beam with UE support with SBA Reciprocal stepping over 3 hurdles and onto airex with UE support on rail Standing DF one LE at a time 2x10 each LE Pt ascended and descended 5 steps with 1 rail with a step to and step through gait and SBA  Seated wand ER approx 10 reps  Reviewed shoulder HEP.  PATIENT EDUCATION:  Education details:  HEP, rationale of interventions, POC, dx, exercise form, and relevant anatomy.  PT answered pt's questions.   Person educated: Patient Education method: Explanation, demonstration, verbal cues Education comprehension: verbalized understanding and needs further education, verbal cues required, returned demonstration  HOME EXERCISE PROGRAM:   Access Code: [email or web address] URL: [email or web address] Date: 09/17/2024 Prepared by: Mose Minerva    ASSESSMENT:  CLINICAL IMPRESSION:  Good tolerance for PT interventions today. Able to incorporate some gym machine strengthening as pt would like to return to gym program in the future. She tolerated this well, but was education on DOMS and expectations. Worked on gait and NMC interventions to improve stability and control with walking. Decreased foot slap noted with heel contact cuing. Pt had good carryover of cuing for increased step length with gait using SPC in clinic. Will continue to progress as tolerated.      OBJECTIVE IMPAIRMENTS: Abnormal gait, decreased activity tolerance, decreased balance, decreased endurance, decreased mobility, difficulty walking, and decreased strength.   ACTIVITY LIMITATIONS: standing, squatting, stairs, transfers, and locomotion level  PARTICIPATION LIMITATIONS: cleaning and community activity  PERSONAL FACTORS: 3+ comorbidities: Arthritis, bilat TKA, peripheral neuropathy are also affecting patient's functional outcome.    REHAB POTENTIAL: Good  CLINICAL DECISION MAKING: Stable/uncomplicated  EVALUATION COMPLEXITY: Low   GOALS:   SHORT TERM GOALS: Target date:  06/05/2024   Pt will be independent and compliant with HEP for improved strength, balance, stability, and mobility.  Baseline: Goal status: GOAL MET 06/27/24  2.  Pt will demo improved time on TUG test by at least 5 seconds for improved mobility and reduced fall risk.   Baseline:  Goal status: ONGOING  12/15  3.  Pt will report at least a 25% improvement in her balance.   Baseline:  Goal status: GOAL MET  06/27/24  4.  Pt will report at least a 25% improvement in daily usage of R UE without adverse effects.  Goal Status:  GOAL MET  11/04/24 Target date: 10/09/2024  5.  Pt will demo at least a 12-15 deg increase in R shoulder flexion AROM and a 5-7 deg increase in R elbow extension AROM for improved performance of reaching and performance of daily activities.  Goal Status:  GOAL MET  11/04/24 Target date: 10/16/2024      LONG TERM GOALS: Target date:  12/30/2024   Pt will score at least 44/56 on the Berg Balance Assessment for improved balance and stability with daily tasks and mobility.  Baseline:  35/56 initial,  40/56 on 11/6 Goal status: NOT MET   2.  Pt will be able to load and unload her dishwasher with good stability and good confidence.  Baseline:  Goal status: GOAL MET  with holding onto counter  9/4  3.  Pt will demo improved bilat LE strength to 4+/5 in hip flexion, 5/5 in knee extension, 4-/5 in R ankle DF, and a 5# increase in L hip abduction for improved performance of functional mobility and performance of household chores.  Baseline:  Goal status:  ONGOING  4.  Pt will be able to ascend and descend stairs with a reciprocal gait with the rail.   Baseline:  Goal status: PROGRESSING  9/4  5.  Pt will report she is able to perform her normal ambulation with cane with good stability and confidence and without  LOB. Baseline:  Goal status:ONGOING  6.  Pt will report she is able to perform her normal reaching activities including reaching into  an overhead cabinet without significant difficulty and pain.   Goal status:  NOT MET  7.  Pt will be able to dress without difficulty.   Goal status:  NOT MET    8.  Pt will be able to perform her ADLs without significant shoulder pain and difficulty.   Goal status:  ONGOING  9.  Pt will report at least a 50% improvement in balance with turning her head and also closing heavy doors.   Goal status:  ONGOING   PLAN:  PT FREQUENCY: 2x/wk  PT DURATION: 6 weeks  PLANNED INTERVENTIONS: 02835- PT Re-evaluation, 97750- Physical Performance Testing, 97110-Therapeutic exercises, 97530- Therapeutic activity, [id number]- Neuromuscular re-education, 97535- Self Care, 02859- Manual therapy, [phone]- Gait training, [phone]- Aquatic Therapy, [phone]- Electrical stimulation (unattended), Patient/Family education, Balance training, Stair training, Taping, DME instructions, Cryotherapy, and Moist heat  PLAN FOR NEXT SESSION: Work on [employer].  LE strengthening, gait, and balance activities.  Work on step ups and step downs.  Cont with shoulder ROM.    Asberry Rodes, PTA  12/24/24 1:01 PM                                                 "

## 2024-12-24 NOTE — Therapy (Signed)
 "      OUTPATIENT PHYSICAL THERAPY TREATMENT           Patient Name: Hailey Perkins MRN: [id number] DOB:[birth year], 89 y.o., female Today's Date: 12/24/2024  END OF SESSION:  PT End of Session - 12/24/24 1304     Visit Number 40    Number of Visits 46    Date for Recertification  12/30/24    Authorization Type BCBS MCR    PT Start Time 1302    PT Stop Time 1345    PT Time Calculation (min) 43 min    Activity Tolerance Patient tolerated treatment well    Behavior During Therapy WFL for tasks assessed/performed                   Past Medical History:  Diagnosis Date   Aortic atherosclerosis    Arthritis    Cataract 2010   bilateral eyes   Chicken pox    Cholelithiasis    Diverticulitis    Diverticulosis    GERD (gastroesophageal reflux disease)    Glaucoma    Hiatal hernia    History of frequent urinary tract infections    Hyperlipidemia    Osteopenia    Peripheral neuropathy    Sleep apnea    wears a CPAP   Urine incontinence    Past Surgical History:  Procedure Laterality Date   ABDOMINAL HYSTERECTOMY  1977   ANAL FISTULECTOMY  1985   ANKLE FRACTURE SURGERY Right 2014   APPENDECTOMY     BIOPSY  02/15/2021   Procedure: BIOPSY;  Surgeon: Wilhelmenia Aloha Raddle., MD;  Location: [facility];  Service: Gastroenterology;;   BREAST BIOPSY Bilateral 10+ yrs ago   BENIGN   BUNIONECTOMY Left 1975   Left Toe   CATARACT EXTRACTION, BILATERAL  2009   COLONOSCOPY WITH PROPOFOL  N/A 02/15/2021   Procedure: COLONOSCOPY WITH PROPOFOL ;  Surgeon: Wilhelmenia Aloha Raddle., MD;  Location: [facility];  Service: Gastroenterology;  Laterality: N/A;  ultraslimscope    DILATION AND CURETTAGE OF UTERUS  1972   eye PRK vision correction Left 10/09/2013   eye socket re-formed Left 2014   FOOT NEUROMA SURGERY Right 2003   plus bunionectomy, right great toe   HAMMER TOE SURGERY Right 2005   Right foot correction hammertoe little toe   HEMORRHOID SURGERY  1967    HIP SURGERY Right 2014   right hip femur pin implanted   KNEE ARTHROSCOPY Left 2010   debridement left knee scar tissue   LAPAROSCOPIC OOPHERECTOMY     1986, 1990   LASIK  1998   NEUROMA SURGERY Right 2003   Right Foot Neuroma, plus Bunionectomy, Right Great Toe   POLYPECTOMY  02/15/2021   Procedure: POLYPECTOMY;  Surgeon: Wilhelmenia Aloha Raddle., MD;  Location: [facility];  Service: Gastroenterology;;   RECTOCELE REPAIR  1968   REPLACEMENT TOTAL KNEE Left 01/2008   REPLACEMENT TOTAL KNEE Right 08/2008   retinal pucker correction Left 07/14/2012   schwannoma tumor (benign) removal  2011   right rib   SIGMOIDOSCOPY     TONSILLECTOMY AND ADENOIDECTOMY     VENTRAL HERNIA REPAIR  1993   Patient Active Problem List   Diagnosis Date Noted   Precordial chest pain 10/14/2024   Arthralgia of right elbow 08/24/2022   Bilateral wrist pain 08/24/2022   Neck pain 08/24/2022   Pain of right hip joint 08/24/2022   Chronic pain of right knee 05/27/2021   OSA on CPAP 05/27/2021   LLQ pain  12/16/2020   History of diverticulitis 12/16/2020   Hematochezia 12/16/2020   Abnormal colonoscopy 12/16/2020   Acquired trigger finger of right index finger 10/26/2020   Other specified arthritis, right hand 10/26/2020   Diverticulitis of colon 08/15/2020   Dry eyes, bilateral 10/10/2019   Sleep-related hypoxia 08/05/2019   OSA (obstructive sleep apnea) 08/05/2019   PLMD (periodic limb movement disorder) 08/05/2019   Cerebellar 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) 05/06/2019   Excessive daytime sleepiness 05/06/2019   Loud snoring 05/06/2019   Ventral hernia without obstruction or gangrene 02/22/2019   Chronic throat clearing 01/18/2019   Nocturnal cough 01/18/2019   Family history of colon cancer in father 01/18/2019   Diverticulosis 01/18/2019   Gait abnormality 12/27/2018   Numbness 12/27/2018   Status post total bilateral knee replacement 11/30/2018   DDD (degenerative disc  disease), cervical 11/30/2018   Status post carpal tunnel release 11/30/2018   DDD (degenerative disc disease), lumbar 10/26/2018   Primary osteoarthritis of both hands 10/26/2018   Primary osteoarthritis of both feet 10/26/2018   Gastroesophageal reflux disease 08/27/2018   Bilateral impacted cerumen 08/21/2018   Sensorineural hearing loss (SNHL) of both ears 08/21/2018   Tinnitus, bilateral 08/21/2018   Blood in urine 04/17/2018   Secondary open-angle glaucoma of left eye, moderate stage 03/10/2018   Early stage 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 10/05/2017   Epiretinal membrane (ERM) of left eye 10/05/2017   Pseudophakia of both eyes 10/05/2017   Osteopenia 03/19/2017   Mixed hyperlipidemia 08/18/2016   Generalized osteoarthritis of multiple sites 08/18/2016   Peripheral neuropathic pain 08/18/2016   Overactive bladder 08/18/2016   Glaucoma 08/18/2016   Cervical disc disorder with radiculopathy of cervical region 08/17/2016   Carpal tunnel syndrome, right upper limb 08/17/2016   Abnormal finding on mammography 06/14/2016   Breast lump 05/06/2015    PCP: Yolande Toribio MATSU, MD  REFERRING PROVIDER:  Yolande Toribio MATSU, MD   Delane Lye, MD   REFERRING DIAG:  R26.81 (ICD-10-CM) - Unsteadiness on feet/Unsteady gait   M25.511 (ICD-10-CM) - Pain in right shoulder   THERAPY DIAG:  Stiffness of right shoulder, not elsewhere classified  Muscle weakness (generalized)  Other abnormalities of gait and mobility  Right shoulder pain, unspecified chronicity  Unsteadiness on feet  Rationale for Evaluation and Treatment: Rehabilitation  ONSET DATE: 2021 ; PT order 04/04/2024  SUBJECTIVE:   SUBJECTIVE STATEMENT: Pt had sleep study last night, which went well. Took prednisone last week for a sore throat and thinks this helped with her R shoulder pain. No pain at rest. Pt had a fall on Thursday last week in her garage when she tripped over a runner  getting out of her car. She states she was able to get to a shelf and use it to help her stand up. Landed on R knee but denies injury of increase in pain. Removed runner from the area.     PERTINENT HISTORY: Pt uses a SPC with walking.  Flexor tenotomy on 3 toes of R foot on 05/01/24--Pt reports she has no restrictions from MD. Arthritis, osteopenia, peripheral neuropathy Bilat TKR  PAIN:  R shoulder:  0/10    PRECAUTIONS: Fall and Other: peripheral neuropathy   WEIGHT BEARING RESTRICTIONS: No  FALLS:  Has patient fallen in last 6 months? Yes. Number of falls 1 (12/05/24)  LIVING ENVIRONMENT: Lives with: lives alone Lives in:  1 story home Stairs: No Has following equipment at home: Single point cane, shower chair, and Grab  bars  OCCUPATION:  Retired  PLOF: Independent  PATIENT GOALS: improved ability to pivot.  Improved walking on uneven terrain and performing stairs   OBJECTIVE:  Note: Objective measures were completed at Evaluation unless otherwise noted.  DIAGNOSTIC FINDINGS: Pt reports x rays were negative for fracture.   TREATMENT DATE:     12/11/24 -Pulleys flexion x , scaption x6min -Standing finger ladder walks flexion x10reps -STS 2x10 -seated toe raises 2x20 -seated marches 2x10 -standing HR 2x10 -hurdle step overs fwd and lateral x4hurdles x3 laps each with single UE support -SLS trials with fingertip support 10sec x5ea -Wlaking marching along rial with light UE support x4 laps -Sidestepping on airex beam x5laps -Gait in hall with [facility], cues for increased step length x120ft     12/04/24  Pulleys in flexion and scaption x 20 each Standing ladder walks in flexion x 10 reps Shelf reach x 10, 1# x10 reps Sidestepping on airex beam with UE support with SBA Tandem gait on airex beam with UE support with SBA Reciprocal stepping over 3 hurdles and onto airex with UE support on rail Standing DF one LE at a time 2x10 each LE Pt ascended and  descended 5 steps with 1 rail with a step to and step through gait and SBA  Seated wand ER approx 10 reps  Reviewed shoulder HEP.                                                                  PATIENT EDUCATION:  Education details:  HEP, rationale of interventions, POC, dx, exercise form, and relevant anatomy.  PT answered pt's questions.   Person educated: Patient Education method: Explanation, demonstration, verbal cues Education comprehension: verbalized understanding and needs further education, verbal cues required, returned demonstration  HOME EXERCISE PROGRAM:   Access Code: [email or web address] URL: [email or web address] Date: 09/17/2024 Prepared by: Mose Minerva    ASSESSMENT:  CLINICAL IMPRESSION:  No complaints of increased pain with shoulder focused exercises. She is very challenged by standing heel raises. Discussed continuing to perform these in water when she resumes her pool classes. With gait, she continues to demonstrates shortened step length, decreased heel contact, and decreased toe off. Worked on SLS trials to improve single leg stability which is likely affecting her gait quality. Pt tends to become unsteady with turning movement or head movements. She does note suffering from vertigo. Discussed PT intervention for this and to talk to PCP if this may help her.      OBJECTIVE IMPAIRMENTS: Abnormal gait, decreased activity tolerance, decreased balance, decreased endurance, decreased mobility, difficulty walking, and decreased strength.   ACTIVITY LIMITATIONS: standing, squatting, stairs, transfers, and locomotion level  PARTICIPATION LIMITATIONS: cleaning and community activity  PERSONAL FACTORS: 3+ comorbidities: Arthritis, bilat TKA, peripheral neuropathy are also affecting patient's functional outcome.   REHAB POTENTIAL: Good  CLINICAL DECISION MAKING: Stable/uncomplicated  EVALUATION COMPLEXITY: Low   GOALS:   SHORT TERM GOALS: Target  date:  06/05/2024   Pt will be independent and compliant with HEP for improved strength, balance, stability, and mobility.  Baseline: Goal status: GOAL MET 06/27/24  2.  Pt will demo improved time on TUG test by at least 5 seconds for improved mobility and reduced fall risk.   Baseline:  Goal status: ONGOING  12/15  3.  Pt will report at least a 25% improvement in her balance.   Baseline:  Goal status: GOAL MET  06/27/24  4.  Pt will report at least a 25% improvement in daily usage of R UE without adverse effects.  Goal Status:  GOAL MET  11/04/24 Target date: 10/09/2024  5.  Pt will demo at least a 12-15 deg increase in R shoulder flexion AROM and a 5-7 deg increase in R elbow extension AROM for improved performance of reaching and performance of daily activities.  Goal Status:  GOAL MET  11/04/24 Target date: 10/16/2024      LONG TERM GOALS: Target date:  12/30/2024   Pt will score at least 44/56 on the Berg Balance Assessment for improved balance and stability with daily tasks and mobility.  Baseline:  35/56 initial,  40/56 on 11/6 Goal status: NOT MET   2.  Pt will be able to load and unload her dishwasher with good stability and good confidence.  Baseline:  Goal status: GOAL MET  with holding onto counter  9/4  3.  Pt will demo improved bilat LE strength to 4+/5 in hip flexion, 5/5 in knee extension, 4-/5 in R ankle DF, and a 5# increase in L hip abduction for improved performance of functional mobility and performance of household chores.  Baseline:  Goal status:  ONGOING  4.  Pt will be able to ascend and descend stairs with a reciprocal gait with the rail.   Baseline:  Goal status: PROGRESSING  9/4  5.  Pt will report she is able to perform her normal ambulation with cane with good stability and confidence and without LOB. Baseline:  Goal status:ONGOING  6.  Pt will report she is able to perform her normal reaching activities including reaching into an overhead  cabinet without significant difficulty and pain.   Goal status:  NOT MET  7.  Pt will be able to dress without difficulty.   Goal status:  NOT MET    8.  Pt will be able to perform her ADLs without significant shoulder pain and difficulty.   Goal status:  ONGOING  9.  Pt will report at least a 50% improvement in balance with turning her head and also closing heavy doors.   Goal status:  ONGOING   PLAN:  PT FREQUENCY: 2x/wk  PT DURATION: 6 weeks  PLANNED INTERVENTIONS: 02835- PT Re-evaluation, 97750- Physical Performance Testing, 97110-Therapeutic exercises, 97530- Therapeutic activity, [id number]- Neuromuscular re-education, 97535- Self Care, [id number]- Manual therapy, [phone]- Gait training, [phone]- Aquatic Therapy, [phone]- Electrical stimulation (unattended), Patient/Family education, Balance training, Stair training, Taping, DME instructions, Cryotherapy, and Moist heat  PLAN FOR NEXT SESSION: Work on [employer].  LE strengthening, gait, and balance activities.  Work on step ups and step downs.  Cont with shoulder ROM.    Asberry Rodes, PTA  12/24/24 5:16 PM                                                      OUTPATIENT PHYSICAL THERAPY TREATMENT           Patient Name: Hailey Perkins MRN: [id number] DOB:[birth year], 89 y.o., female Today's Date: 12/24/2024  END OF SESSION:  PT End of Session - 12/24/24 1304     Visit Number 40    Number of Visits  46    Date for Recertification  12/30/24    Authorization Type BCBS MCR    PT Start Time 1302    PT Stop Time 1345    PT Time Calculation (min) 43 min    Activity Tolerance Patient tolerated treatment well    Behavior During Therapy WFL for tasks assessed/performed                   Past Medical History:  Diagnosis Date   Aortic atherosclerosis    Arthritis    Cataract 2010   bilateral eyes   Chicken pox    Cholelithiasis    Diverticulitis    Diverticulosis     GERD (gastroesophageal reflux disease)    Glaucoma    Hiatal hernia    History of frequent urinary tract infections    Hyperlipidemia    Osteopenia    Peripheral neuropathy    Sleep apnea    wears a CPAP   Urine incontinence    Past Surgical History:  Procedure Laterality Date   ABDOMINAL HYSTERECTOMY  1977   ANAL FISTULECTOMY  1985   ANKLE FRACTURE SURGERY Right 2014   APPENDECTOMY     BIOPSY  02/15/2021   Procedure: BIOPSY;  Surgeon: Wilhelmenia Aloha Raddle., MD;  Location: [facility];  Service: Gastroenterology;;   BREAST BIOPSY Bilateral 10+ yrs ago   BENIGN   BUNIONECTOMY Left 1975   Left Toe   CATARACT EXTRACTION, BILATERAL  2009   COLONOSCOPY WITH PROPOFOL  N/A 02/15/2021   Procedure: COLONOSCOPY WITH PROPOFOL ;  Surgeon: Wilhelmenia Aloha Raddle., MD;  Location: [facility];  Service: Gastroenterology;  Laterality: N/A;  ultraslimscope    DILATION AND CURETTAGE OF UTERUS  1972   eye PRK vision correction Left 10/09/2013   eye socket re-formed Left 2014   FOOT NEUROMA SURGERY Right 2003   plus bunionectomy, right great toe   HAMMER TOE SURGERY Right 2005   Right foot correction hammertoe little toe   HEMORRHOID SURGERY  1967   HIP SURGERY Right 2014   right hip femur pin implanted   KNEE ARTHROSCOPY Left 2010   debridement left knee scar tissue   LAPAROSCOPIC OOPHERECTOMY     1986, 1990   LASIK  1998   NEUROMA SURGERY Right 2003   Right Foot Neuroma, plus Bunionectomy, Right Great Toe   POLYPECTOMY  02/15/2021   Procedure: POLYPECTOMY;  Surgeon: Wilhelmenia Aloha Raddle., MD;  Location: [facility];  Service: Gastroenterology;;   RECTOCELE REPAIR  1968   REPLACEMENT TOTAL KNEE Left 01/2008   REPLACEMENT TOTAL KNEE Right 08/2008   retinal pucker correction Left 07/14/2012   schwannoma tumor (benign) removal  2011   right rib   SIGMOIDOSCOPY     TONSILLECTOMY AND ADENOIDECTOMY     VENTRAL HERNIA REPAIR  1993   Patient Active Problem List   Diagnosis Date  Noted   Precordial chest pain 10/14/2024   Arthralgia of right elbow 08/24/2022   Bilateral wrist pain 08/24/2022   Neck pain 08/24/2022   Pain of right hip joint 08/24/2022   Chronic pain of right knee 05/27/2021   OSA on CPAP 05/27/2021   LLQ pain 12/16/2020   History of diverticulitis 12/16/2020   Hematochezia 12/16/2020   Abnormal colonoscopy 12/16/2020   Acquired trigger finger of right index finger 10/26/2020   Other specified arthritis, right hand 10/26/2020   Diverticulitis of colon 08/15/2020   Dry eyes, bilateral 10/10/2019   Sleep-related hypoxia 08/05/2019   OSA (obstructive sleep apnea) 08/05/2019  PLMD (periodic limb movement disorder) 08/05/2019   Cerebellar 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) 05/06/2019   Excessive daytime sleepiness 05/06/2019   Loud snoring 05/06/2019   Ventral hernia without obstruction or gangrene 02/22/2019   Chronic throat clearing 01/18/2019   Nocturnal cough 01/18/2019   Family history of colon cancer in father 01/18/2019   Diverticulosis 01/18/2019   Gait abnormality 12/27/2018   Numbness 12/27/2018   Status post total bilateral knee replacement 11/30/2018   DDD (degenerative disc disease), cervical 11/30/2018   Status post carpal tunnel release 11/30/2018   DDD (degenerative disc disease), lumbar 10/26/2018   Primary osteoarthritis of both hands 10/26/2018   Primary osteoarthritis of both feet 10/26/2018   Gastroesophageal reflux disease 08/27/2018   Bilateral impacted cerumen 08/21/2018   Sensorineural hearing loss (SNHL) of both ears 08/21/2018   Tinnitus, bilateral 08/21/2018   Blood in urine 04/17/2018   Secondary open-angle glaucoma of left eye, moderate stage 03/10/2018   Early stage 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 10/05/2017   Epiretinal membrane (ERM) of left eye 10/05/2017   Pseudophakia of both eyes 10/05/2017   Osteopenia 03/19/2017   Mixed hyperlipidemia 08/18/2016   Generalized  osteoarthritis of multiple sites 08/18/2016   Peripheral neuropathic pain 08/18/2016   Overactive bladder 08/18/2016   Glaucoma 08/18/2016   Cervical disc disorder with radiculopathy of cervical region 08/17/2016   Carpal tunnel syndrome, right upper limb 08/17/2016   Abnormal finding on mammography 06/14/2016   Breast lump 05/06/2015    PCP: Yolande Toribio MATSU, MD  REFERRING PROVIDER:  Yolande Toribio MATSU, MD   Delane Lye, MD   REFERRING DIAG:  R26.81 (ICD-10-CM) - Unsteadiness on feet/Unsteady gait   M25.511 (ICD-10-CM) - Pain in right shoulder   THERAPY DIAG:  Stiffness of right shoulder, not elsewhere classified  Muscle weakness (generalized)  Other abnormalities of gait and mobility  Right shoulder pain, unspecified chronicity  Unsteadiness on feet  Rationale for Evaluation and Treatment: Rehabilitation  ONSET DATE: 2021 ; PT order 04/04/2024  SUBJECTIVE:   SUBJECTIVE STATEMENT: Pt reports MD told her PT would help with her shoulder arthritis (xrays came back). She states she had a near fall recently but was able to maintain her balance.     PERTINENT HISTORY: Pt uses a SPC with walking.  Flexor tenotomy on 3 toes of R foot on 05/01/24--Pt reports she has no restrictions from MD. Arthritis, osteopenia, peripheral neuropathy Bilat TKR  PAIN:  R shoulder:  0/10    PRECAUTIONS: Fall and Other: peripheral neuropathy   WEIGHT BEARING RESTRICTIONS: No  FALLS:  Has patient fallen in last 6 months? Yes. Number of falls 1 (12/05/24)  LIVING ENVIRONMENT: Lives with: lives alone Lives in:  1 story home Stairs: No Has following equipment at home: Single point cane, shower chair, and Grab bars  OCCUPATION:  Retired  PLOF: Independent  PATIENT GOALS: improved ability to pivot.  Improved walking on uneven terrain and performing stairs   OBJECTIVE:  Note: Objective measures were completed at Evaluation unless otherwise noted.  DIAGNOSTIC FINDINGS: Pt  reports x rays were negative for fracture.   TREATMENT DATE:     12/24/24 -Ting ladder flexion x10 -standing HR 2x10 -walking marches with rail support x2 laps -side stepping along rail x2 laps -retro walking along rail x2 laps (cues for longer steps) -marching on airex 2x10 -Fwd step ups on airex 2x10 -squats at rail x10 -HSC machine 25# 2x10 -leg extension machine 10# 2x10   12/19/24 Pulleys x40min  flexion, x35min scaption -Ines ladder flexion x10 -standing HR 2x10 -walking marches with rail support x2 laps -Exaggerated heel contact and toe off gait with rail x2 laps -side stepping along rail x2 laps -retro walking along rail x2 laps (cues for longer steps) -squats at rail x10 -HSC machine 25# 2x10 -leg extension machine 10# 2x10     12/11/24 -Pulleys flexion x , scaption x69min -Standing finger ladder walks flexion x10reps -STS 2x10 -seated toe raises 2x20 -seated marches 2x10 -standing HR 2x10 -hurdle step overs fwd and lateral x4hurdles x3 laps each with single UE support -SLS trials with fingertip support 10sec x5ea -Wlaking marching along rial with light UE support x4 laps -Sidestepping on airex beam x5laps -Gait in hall with [facility], cues for increased step length x121ft     12/04/24  Pulleys in flexion and scaption x 20 each Standing ladder walks in flexion x 10 reps Shelf reach x 10, 1# x10 reps Sidestepping on airex beam with UE support with SBA Tandem gait on airex beam with UE support with SBA Reciprocal stepping over 3 hurdles and onto airex with UE support on rail Standing DF one LE at a time 2x10 each LE Pt ascended and descended 5 steps with 1 rail with a step to and step through gait and SBA  Seated wand ER approx 10 reps  Reviewed shoulder HEP.                                                                  PATIENT EDUCATION:  Education details:  HEP, rationale of interventions, POC, dx, exercise form, and relevant anatomy.  PT answered  pt's questions.   Person educated: Patient Education method: Explanation, demonstration, verbal cues Education comprehension: verbalized understanding and needs further education, verbal cues required, returned demonstration  HOME EXERCISE PROGRAM:   Access Code: [email or web address] URL: [email or web address] Date: 09/17/2024 Prepared by: Mose Minerva    ASSESSMENT:  CLINICAL IMPRESSION:  Pt able to continue with gym machine strengthening with good tolerance. Progressed balance challenges today with good tolerance. She does require SBA with balance tasks as a precaution. She again required cuing for increased step length with retro walking along railing. Will continue to progress as tolerated.      OBJECTIVE IMPAIRMENTS: Abnormal gait, decreased activity tolerance, decreased balance, decreased endurance, decreased mobility, difficulty walking, and decreased strength.   ACTIVITY LIMITATIONS: standing, squatting, stairs, transfers, and locomotion level  PARTICIPATION LIMITATIONS: cleaning and community activity  PERSONAL FACTORS: 3+ comorbidities: Arthritis, bilat TKA, peripheral neuropathy are also affecting patient's functional outcome.   REHAB POTENTIAL: Good  CLINICAL DECISION MAKING: Stable/uncomplicated  EVALUATION COMPLEXITY: Low   GOALS:   SHORT TERM GOALS: Target date:  06/05/2024   Pt will be independent and compliant with HEP for improved strength, balance, stability, and mobility.  Baseline: Goal status: GOAL MET 06/27/24  2.  Pt will demo improved time on TUG test by at least 5 seconds for improved mobility and reduced fall risk.   Baseline:  Goal status: ONGOING  12/15  3.  Pt will report at least a 25% improvement in her balance.   Baseline:  Goal status: GOAL MET  06/27/24  4.  Pt will report at least a 25% improvement in daily usage of R UE without adverse  effects.  Goal Status:  GOAL MET  11/04/24 Target date: 10/09/2024  5.  Pt will demo at  least a 12-15 deg increase in R shoulder flexion AROM and a 5-7 deg increase in R elbow extension AROM for improved performance of reaching and performance of daily activities.  Goal Status:  GOAL MET  11/04/24 Target date: 10/16/2024      LONG TERM GOALS: Target date:  12/30/2024   Pt will score at least 44/56 on the Berg Balance Assessment for improved balance and stability with daily tasks and mobility.  Baseline:  35/56 initial,  40/56 on 11/6 Goal status: NOT MET   2.  Pt will be able to load and unload her dishwasher with good stability and good confidence.  Baseline:  Goal status: GOAL MET  with holding onto counter  9/4  3.  Pt will demo improved bilat LE strength to 4+/5 in hip flexion, 5/5 in knee extension, 4-/5 in R ankle DF, and a 5# increase in L hip abduction for improved performance of functional mobility and performance of household chores.  Baseline:  Goal status:  ONGOING  4.  Pt will be able to ascend and descend stairs with a reciprocal gait with the rail.   Baseline:  Goal status: PROGRESSING  9/4  5.  Pt will report she is able to perform her normal ambulation with cane with good stability and confidence and without LOB. Baseline:  Goal status:ONGOING  6.  Pt will report she is able to perform her normal reaching activities including reaching into an overhead cabinet without significant difficulty and pain.   Goal status:  NOT MET  7.  Pt will be able to dress without difficulty.   Goal status:  NOT MET    8.  Pt will be able to perform her ADLs without significant shoulder pain and difficulty.   Goal status:  ONGOING  9.  Pt will report at least a 50% improvement in balance with turning her head and also closing heavy doors.   Goal status:  ONGOING   PLAN:  PT FREQUENCY: 2x/wk  PT DURATION: 6 weeks  PLANNED INTERVENTIONS: 02835- PT Re-evaluation, 97750- Physical Performance Testing, 97110-Therapeutic exercises, 97530- Therapeutic activity, [id number]  Neuromuscular re-education, 97535- Self Care, 02859- Manual therapy, [phone]- Gait training, [phone]- Aquatic Therapy, [phone]- Electrical stimulation (unattended), Patient/Family education, Balance training, Stair training, Taping, DME instructions, Cryotherapy, and Moist heat  PLAN FOR NEXT SESSION: Work on [employer].  LE strengthening, gait, and balance activities.  Work on step ups and step downs.  Cont with shoulder ROM.    Asberry Rodes, PTA  12/24/24 5:16 PM                                                 "

## 2024-12-26 ENCOUNTER — Encounter (HOSPITAL_BASED_OUTPATIENT_CLINIC_OR_DEPARTMENT_OTHER): Payer: Self-pay

## 2024-12-26 ENCOUNTER — Ambulatory Visit (HOSPITAL_BASED_OUTPATIENT_CLINIC_OR_DEPARTMENT_OTHER)

## 2024-12-26 DIAGNOSIS — M25611 Stiffness of right shoulder, not elsewhere classified: Secondary | ICD-10-CM

## 2024-12-26 DIAGNOSIS — M25511 Pain in right shoulder: Secondary | ICD-10-CM

## 2024-12-26 DIAGNOSIS — R2681 Unsteadiness on feet: Secondary | ICD-10-CM

## 2024-12-26 DIAGNOSIS — M6281 Muscle weakness (generalized): Secondary | ICD-10-CM

## 2024-12-26 DIAGNOSIS — R2689 Other abnormalities of gait and mobility: Secondary | ICD-10-CM

## 2024-12-26 NOTE — Therapy (Signed)
 "      OUTPATIENT PHYSICAL THERAPY TREATMENT           Patient Name: Hailey Perkins MRN: [id number] DOB:1935-10-23, 89 y.o., female Today's Date: 12/26/2024  END OF SESSION:  PT End of Session - 12/26/24 1445     Visit Number 41    Number of Visits 46    Date for Recertification  12/30/24    Authorization Type BCBS MCR    PT Start Time 1441    PT Stop Time 1521    PT Time Calculation (min) 40 min    Activity Tolerance Patient tolerated treatment well    Behavior During Therapy WFL for tasks assessed/performed                   Past Medical History:  Diagnosis Date   Aortic atherosclerosis    Arthritis    Cataract 2010   bilateral eyes   Chicken pox    Cholelithiasis    Diverticulitis    Diverticulosis    GERD (gastroesophageal reflux disease)    Glaucoma    Hiatal hernia    History of frequent urinary tract infections    Hyperlipidemia    Osteopenia    Peripheral neuropathy    Sleep apnea    wears a CPAP   Urine incontinence    Past Surgical History:  Procedure Laterality Date   ABDOMINAL HYSTERECTOMY  1977   ANAL FISTULECTOMY  1985   ANKLE FRACTURE SURGERY Right 2014   APPENDECTOMY     BIOPSY  02/15/2021   Procedure: BIOPSY;  Surgeon: Wilhelmenia Aloha Raddle., MD;  Location: [facility];  Service: Gastroenterology;;   BREAST BIOPSY Bilateral 10+ yrs ago   BENIGN   BUNIONECTOMY Left 1975   Left Toe   CATARACT EXTRACTION, BILATERAL  2009   COLONOSCOPY WITH PROPOFOL  N/A 02/15/2021   Procedure: COLONOSCOPY WITH PROPOFOL ;  Surgeon: Wilhelmenia Aloha Raddle., MD;  Location: [facility];  Service: Gastroenterology;  Laterality: N/A;  ultraslimscope    DILATION AND CURETTAGE OF UTERUS  1972   eye PRK vision correction Left 10/09/2013   eye socket re-formed Left 2014   FOOT NEUROMA SURGERY Right 2003   plus bunionectomy, right great toe   HAMMER TOE SURGERY Right 2005   Right foot correction hammertoe little toe   HEMORRHOID SURGERY  1967    HIP SURGERY Right 2014   right hip femur pin implanted   KNEE ARTHROSCOPY Left 2010   debridement left knee scar tissue   LAPAROSCOPIC OOPHERECTOMY     1986, 1990   LASIK  1998   NEUROMA SURGERY Right 2003   Right Foot Neuroma, plus Bunionectomy, Right Great Toe   POLYPECTOMY  02/15/2021   Procedure: POLYPECTOMY;  Surgeon: Wilhelmenia Aloha Raddle., MD;  Location: [facility];  Service: Gastroenterology;;   RECTOCELE REPAIR  1968   REPLACEMENT TOTAL KNEE Left 01/2008   REPLACEMENT TOTAL KNEE Right 08/2008   retinal pucker correction Left 07/14/2012   schwannoma tumor (benign) removal  2011   right rib   SIGMOIDOSCOPY     TONSILLECTOMY AND ADENOIDECTOMY     VENTRAL HERNIA REPAIR  1993   Patient Active Problem List   Diagnosis Date Noted   Precordial chest pain 10/14/2024   Arthralgia of right elbow 08/24/2022   Bilateral wrist pain 08/24/2022   Neck pain 08/24/2022   Pain of right hip joint 08/24/2022   Chronic pain of right knee 05/27/2021   OSA on CPAP 05/27/2021   LLQ pain  12/16/2020   History of diverticulitis 12/16/2020   Hematochezia 12/16/2020   Abnormal colonoscopy 12/16/2020   Acquired trigger finger of right index finger 10/26/2020   Other specified arthritis, right hand 10/26/2020   Diverticulitis of colon 08/15/2020   Dry eyes, bilateral 10/10/2019   Sleep-related hypoxia 08/05/2019   OSA (obstructive sleep apnea) 08/05/2019   PLMD (periodic limb movement disorder) 08/05/2019   Cerebellar 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) 05/06/2019   Excessive daytime sleepiness 05/06/2019   Loud snoring 05/06/2019   Ventral hernia without obstruction or gangrene 02/22/2019   Chronic throat clearing 01/18/2019   Nocturnal cough 01/18/2019   Family history of colon cancer in father 01/18/2019   Diverticulosis 01/18/2019   Gait abnormality 12/27/2018   Numbness 12/27/2018   Status post total bilateral knee replacement 11/30/2018   DDD (degenerative disc  disease), cervical 11/30/2018   Status post carpal tunnel release 11/30/2018   DDD (degenerative disc disease), lumbar 10/26/2018   Primary osteoarthritis of both hands 10/26/2018   Primary osteoarthritis of both feet 10/26/2018   Gastroesophageal reflux disease 08/27/2018   Bilateral impacted cerumen 08/21/2018   Sensorineural hearing loss (SNHL) of both ears 08/21/2018   Tinnitus, bilateral 08/21/2018   Blood in urine 04/17/2018   Secondary open-angle glaucoma of left eye, moderate stage 03/10/2018   Early stage 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 10/05/2017   Epiretinal membrane (ERM) of left eye 10/05/2017   Pseudophakia of both eyes 10/05/2017   Osteopenia 03/19/2017   Mixed hyperlipidemia 08/18/2016   Generalized osteoarthritis of multiple sites 08/18/2016   Peripheral neuropathic pain 08/18/2016   Overactive bladder 08/18/2016   Glaucoma 08/18/2016   Cervical disc disorder with radiculopathy of cervical region 08/17/2016   Carpal tunnel syndrome, right upper limb 08/17/2016   Abnormal finding on mammography 06/14/2016   Breast lump 05/06/2015    PCP: Yolande Toribio MATSU, MD  REFERRING PROVIDER:  Yolande Toribio MATSU, MD   Delane Lye, MD   REFERRING DIAG:  R26.81 (ICD-10-CM) - Unsteadiness on feet/Unsteady gait   M25.511 (ICD-10-CM) - Pain in right shoulder   THERAPY DIAG:  Stiffness of right shoulder, not elsewhere classified  Muscle weakness (generalized)  Other abnormalities of gait and mobility  Right shoulder pain, unspecified chronicity  Unsteadiness on feet  Rationale for Evaluation and Treatment: Rehabilitation  ONSET DATE: 2021 ; PT order 04/04/2024  SUBJECTIVE:   SUBJECTIVE STATEMENT: Pt had sleep study last night, which went well. Took prednisone last week for a sore throat and thinks this helped with her R shoulder pain. No pain at rest. Pt had a fall on Thursday last week in her garage when she tripped over a runner  getting out of her car. She states she was able to get to a shelf and use it to help her stand up. Landed on R knee but denies injury of increase in pain. Removed runner from the area.     PERTINENT HISTORY: Pt uses a SPC with walking.  Flexor tenotomy on 3 toes of R foot on 05/01/24--Pt reports she has no restrictions from MD. Arthritis, osteopenia, peripheral neuropathy Bilat TKR  PAIN:  R shoulder:  0/10    PRECAUTIONS: Fall and Other: peripheral neuropathy   WEIGHT BEARING RESTRICTIONS: No  FALLS:  Has patient fallen in last 6 months? Yes. Number of falls 1 (12/05/24)  LIVING ENVIRONMENT: Lives with: lives alone Lives in:  1 story home Stairs: No Has following equipment at home: Single point cane, shower chair, and Grab  bars  OCCUPATION:  Retired  PLOF: Independent  PATIENT GOALS: improved ability to pivot.  Improved walking on uneven terrain and performing stairs   OBJECTIVE:  Note: Objective measures were completed at Evaluation unless otherwise noted.  DIAGNOSTIC FINDINGS: Pt reports x rays were negative for fracture.   TREATMENT DATE:     12/11/24 -Pulleys flexion x , scaption x49min -Standing finger ladder walks flexion x10reps -STS 2x10 -seated toe raises 2x20 -seated marches 2x10 -standing HR 2x10 -hurdle step overs fwd and lateral x4hurdles x3 laps each with single UE support -SLS trials with fingertip support 10sec x5ea -Wlaking marching along rial with light UE support x4 laps -Sidestepping on airex beam x5laps -Gait in hall with [facility], cues for increased step length x133ft     12/04/24  Pulleys in flexion and scaption x 20 each Standing ladder walks in flexion x 10 reps Shelf reach x 10, 1# x10 reps Sidestepping on airex beam with UE support with SBA Tandem gait on airex beam with UE support with SBA Reciprocal stepping over 3 hurdles and onto airex with UE support on rail Standing DF one LE at a time 2x10 each LE Pt ascended and  descended 5 steps with 1 rail with a step to and step through gait and SBA  Seated wand ER approx 10 reps  Reviewed shoulder HEP.                                                                  PATIENT EDUCATION:  Education details:  HEP, rationale of interventions, POC, dx, exercise form, and relevant anatomy.  PT answered pt's questions.   Person educated: Patient Education method: Explanation, demonstration, verbal cues Education comprehension: verbalized understanding and needs further education, verbal cues required, returned demonstration  HOME EXERCISE PROGRAM:   Access Code: [email or web address] URL: [email or web address] Date: 09/17/2024 Prepared by: Mose Minerva    ASSESSMENT:  CLINICAL IMPRESSION:  No complaints of increased pain with shoulder focused exercises. She is very challenged by standing heel raises. Discussed continuing to perform these in water when she resumes her pool classes. With gait, she continues to demonstrates shortened step length, decreased heel contact, and decreased toe off. Worked on SLS trials to improve single leg stability which is likely affecting her gait quality. Pt tends to become unsteady with turning movement or head movements. She does note suffering from vertigo. Discussed PT intervention for this and to talk to PCP if this may help her.      OBJECTIVE IMPAIRMENTS: Abnormal gait, decreased activity tolerance, decreased balance, decreased endurance, decreased mobility, difficulty walking, and decreased strength.   ACTIVITY LIMITATIONS: standing, squatting, stairs, transfers, and locomotion level  PARTICIPATION LIMITATIONS: cleaning and community activity  PERSONAL FACTORS: 3+ comorbidities: Arthritis, bilat TKA, peripheral neuropathy are also affecting patient's functional outcome.   REHAB POTENTIAL: Good  CLINICAL DECISION MAKING: Stable/uncomplicated  EVALUATION COMPLEXITY: Low   GOALS:   SHORT TERM GOALS: Target  date:  06/05/2024   Pt will be independent and compliant with HEP for improved strength, balance, stability, and mobility.  Baseline: Goal status: GOAL MET 06/27/24  2.  Pt will demo improved time on TUG test by at least 5 seconds for improved mobility and reduced fall risk.   Baseline:  Goal status: ONGOING  12/15  3.  Pt will report at least a 25% improvement in her balance.   Baseline:  Goal status: GOAL MET  06/27/24  4.  Pt will report at least a 25% improvement in daily usage of R UE without adverse effects.  Goal Status:  GOAL MET  11/04/24 Target date: 10/09/2024  5.  Pt will demo at least a 12-15 deg increase in R shoulder flexion AROM and a 5-7 deg increase in R elbow extension AROM for improved performance of reaching and performance of daily activities.  Goal Status:  GOAL MET  11/04/24 Target date: 10/16/2024      LONG TERM GOALS: Target date:  12/30/2024   Pt will score at least 44/56 on the Berg Balance Assessment for improved balance and stability with daily tasks and mobility.  Baseline:  35/56 initial,  40/56 on 11/6 Goal status: NOT MET   2.  Pt will be able to load and unload her dishwasher with good stability and good confidence.  Baseline:  Goal status: GOAL MET  with holding onto counter  9/4  3.  Pt will demo improved bilat LE strength to 4+/5 in hip flexion, 5/5 in knee extension, 4-/5 in R ankle DF, and a 5# increase in L hip abduction for improved performance of functional mobility and performance of household chores.  Baseline:  Goal status:  ONGOING  4.  Pt will be able to ascend and descend stairs with a reciprocal gait with the rail.   Baseline:  Goal status: PROGRESSING  9/4  5.  Pt will report she is able to perform her normal ambulation with cane with good stability and confidence and without LOB. Baseline:  Goal status:ONGOING  6.  Pt will report she is able to perform her normal reaching activities including reaching into an overhead  cabinet without significant difficulty and pain.   Goal status:  NOT MET  7.  Pt will be able to dress without difficulty.   Goal status:  NOT MET    8.  Pt will be able to perform her ADLs without significant shoulder pain and difficulty.   Goal status:  ONGOING  9.  Pt will report at least a 50% improvement in balance with turning her head and also closing heavy doors.   Goal status:  ONGOING   PLAN:  PT FREQUENCY: 2x/wk  PT DURATION: 6 weeks  PLANNED INTERVENTIONS: 02835- PT Re-evaluation, 97750- Physical Performance Testing, 97110-Therapeutic exercises, 97530- Therapeutic activity, [id number]- Neuromuscular re-education, 97535- Self Care, 02859- Manual therapy, [phone]- Gait training, [phone]- Aquatic Therapy, [phone]- Electrical stimulation (unattended), Patient/Family education, Balance training, Stair training, Taping, DME instructions, Cryotherapy, and Moist heat  PLAN FOR NEXT SESSION: Work on [employer].  LE strengthening, gait, and balance activities.  Work on step ups and step downs.  Cont with shoulder ROM.    Asberry Rodes, PTA  12/26/24 4:04 PM                                                      OUTPATIENT PHYSICAL THERAPY TREATMENT           Patient Name: Hailey Perkins MRN: [id number] DOB:12-Nov-1935, 89 y.o., female Today's Date: 12/26/2024  END OF SESSION:  PT End of Session - 12/26/24 1445     Visit Number 41    Number of Visits  46    Date for Recertification  12/30/24    Authorization Type BCBS MCR    PT Start Time 1441    PT Stop Time 1521    PT Time Calculation (min) 40 min    Activity Tolerance Patient tolerated treatment well    Behavior During Therapy WFL for tasks assessed/performed                   Past Medical History:  Diagnosis Date   Aortic atherosclerosis    Arthritis    Cataract 2010   bilateral eyes   Chicken pox    Cholelithiasis    Diverticulitis    Diverticulosis     GERD (gastroesophageal reflux disease)    Glaucoma    Hiatal hernia    History of frequent urinary tract infections    Hyperlipidemia    Osteopenia    Peripheral neuropathy    Sleep apnea    wears a CPAP   Urine incontinence    Past Surgical History:  Procedure Laterality Date   ABDOMINAL HYSTERECTOMY  1977   ANAL FISTULECTOMY  1985   ANKLE FRACTURE SURGERY Right 2014   APPENDECTOMY     BIOPSY  02/15/2021   Procedure: BIOPSY;  Surgeon: Wilhelmenia Aloha Raddle., MD;  Location: [facility];  Service: Gastroenterology;;   BREAST BIOPSY Bilateral 10+ yrs ago   BENIGN   BUNIONECTOMY Left 1975   Left Toe   CATARACT EXTRACTION, BILATERAL  2009   COLONOSCOPY WITH PROPOFOL  N/A 02/15/2021   Procedure: COLONOSCOPY WITH PROPOFOL ;  Surgeon: Wilhelmenia Aloha Raddle., MD;  Location: [facility];  Service: Gastroenterology;  Laterality: N/A;  ultraslimscope    DILATION AND CURETTAGE OF UTERUS  1972   eye PRK vision correction Left 10/09/2013   eye socket re-formed Left 2014   FOOT NEUROMA SURGERY Right 2003   plus bunionectomy, right great toe   HAMMER TOE SURGERY Right 2005   Right foot correction hammertoe little toe   HEMORRHOID SURGERY  1967   HIP SURGERY Right 2014   right hip femur pin implanted   KNEE ARTHROSCOPY Left 2010   debridement left knee scar tissue   LAPAROSCOPIC OOPHERECTOMY     1986, 1990   LASIK  1998   NEUROMA SURGERY Right 2003   Right Foot Neuroma, plus Bunionectomy, Right Great Toe   POLYPECTOMY  02/15/2021   Procedure: POLYPECTOMY;  Surgeon: Wilhelmenia Aloha Raddle., MD;  Location: [facility];  Service: Gastroenterology;;   RECTOCELE REPAIR  1968   REPLACEMENT TOTAL KNEE Left 01/2008   REPLACEMENT TOTAL KNEE Right 08/2008   retinal pucker correction Left 07/14/2012   schwannoma tumor (benign) removal  2011   right rib   SIGMOIDOSCOPY     TONSILLECTOMY AND ADENOIDECTOMY     VENTRAL HERNIA REPAIR  1993   Patient Active Problem List   Diagnosis Date  Noted   Precordial chest pain 10/14/2024   Arthralgia of right elbow 08/24/2022   Bilateral wrist pain 08/24/2022   Neck pain 08/24/2022   Pain of right hip joint 08/24/2022   Chronic pain of right knee 05/27/2021   OSA on CPAP 05/27/2021   LLQ pain 12/16/2020   History of diverticulitis 12/16/2020   Hematochezia 12/16/2020   Abnormal colonoscopy 12/16/2020   Acquired trigger finger of right index finger 10/26/2020   Other specified arthritis, right hand 10/26/2020   Diverticulitis of colon 08/15/2020   Dry eyes, bilateral 10/10/2019   Sleep-related hypoxia 08/05/2019   OSA (obstructive sleep apnea) 08/05/2019  PLMD (periodic limb movement disorder) 08/05/2019   Cerebellar 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) 05/06/2019   Excessive daytime sleepiness 05/06/2019   Loud snoring 05/06/2019   Ventral hernia without obstruction or gangrene 02/22/2019   Chronic throat clearing 01/18/2019   Nocturnal cough 01/18/2019   Family history of colon cancer in father 01/18/2019   Diverticulosis 01/18/2019   Gait abnormality 12/27/2018   Numbness 12/27/2018   Status post total bilateral knee replacement 11/30/2018   DDD (degenerative disc disease), cervical 11/30/2018   Status post carpal tunnel release 11/30/2018   DDD (degenerative disc disease), lumbar 10/26/2018   Primary osteoarthritis of both hands 10/26/2018   Primary osteoarthritis of both feet 10/26/2018   Gastroesophageal reflux disease 08/27/2018   Bilateral impacted cerumen 08/21/2018   Sensorineural hearing loss (SNHL) of both ears 08/21/2018   Tinnitus, bilateral 08/21/2018   Blood in urine 04/17/2018   Secondary open-angle glaucoma of left eye, moderate stage 03/10/2018   Early stage 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 10/05/2017   Epiretinal membrane (ERM) of left eye 10/05/2017   Pseudophakia of both eyes 10/05/2017   Osteopenia 03/19/2017   Mixed hyperlipidemia 08/18/2016   Generalized  osteoarthritis of multiple sites 08/18/2016   Peripheral neuropathic pain 08/18/2016   Overactive bladder 08/18/2016   Glaucoma 08/18/2016   Cervical disc disorder with radiculopathy of cervical region 08/17/2016   Carpal tunnel syndrome, right upper limb 08/17/2016   Abnormal finding on mammography 06/14/2016   Breast lump 05/06/2015    PCP: Yolande Toribio MATSU, MD  REFERRING PROVIDER:  Yolande Toribio MATSU, MD   Delane Lye, MD   REFERRING DIAG:  R26.81 (ICD-10-CM) - Unsteadiness on feet/Unsteady gait   M25.511 (ICD-10-CM) - Pain in right shoulder   THERAPY DIAG:  Stiffness of right shoulder, not elsewhere classified  Muscle weakness (generalized)  Other abnormalities of gait and mobility  Right shoulder pain, unspecified chronicity  Unsteadiness on feet  Rationale for Evaluation and Treatment: Rehabilitation  ONSET DATE: 2021 ; PT order 04/04/2024  SUBJECTIVE:   SUBJECTIVE STATEMENT: Pt reports no pain in shoulders at entry. My right arm is so much better. It doesn't wake me up anymore.    PERTINENT HISTORY: Pt uses a SPC with walking.  Flexor tenotomy on 3 toes of R foot on 05/01/24--Pt reports she has no restrictions from MD. Arthritis, osteopenia, peripheral neuropathy Bilat TKR  PAIN:  R shoulder:  0/10    PRECAUTIONS: Fall and Other: peripheral neuropathy   WEIGHT BEARING RESTRICTIONS: No  FALLS:  Has patient fallen in last 6 months? Yes. Number of falls 1 (12/05/24)  LIVING ENVIRONMENT: Lives with: lives alone Lives in:  1 story home Stairs: No Has following equipment at home: Single point cane, shower chair, and Grab bars  OCCUPATION:  Retired  PLOF: Independent  PATIENT GOALS: improved ability to pivot.  Improved walking on uneven terrain and performing stairs   OBJECTIVE:  Note: Objective measures were completed at Evaluation unless otherwise noted.  DIAGNOSTIC FINDINGS: Pt reports x rays were negative for fracture.   TREATMENT  DATE:     12/26/24 -Basilio ladder flexion x10 -standing HR 2x10 -walking marches with rail support x2 laps -side stepping along rail x2 laps -retro walking along rail x2 laps (cues for longer steps) -marching on airex 2x10 -Fwd step ups on airex 2x10 -squats at rail 2x10 -stepping strategies for balance- standing on airex, multi direction perturbations. Gait belt and CGA utilized  12/24/24 -Valdes ladder flexion x10 -standing HR 2x10 -  walking marches with rail support x2 laps -side stepping along rail x2 laps -retro walking along rail x2 laps (cues for longer steps) -marching on airex 2x10 -Fwd step ups on airex 2x10 -squats at rail x10 -HSC machine 25# 2x10 -leg extension machine 10# 2x10   12/19/24 Pulleys x41min flexion, x39min scaption -Vogler ladder flexion x10 -standing HR 2x10 -walking marches with rail support x2 laps -Exaggerated heel contact and toe off gait with rail x2 laps -side stepping along rail x2 laps -retro walking along rail x2 laps (cues for longer steps) -squats at rail x10 -HSC machine 25# 2x10 -leg extension machine 10# 2x10      PATIENT EDUCATION:  Education details:  HEP, rationale of interventions, POC, dx, exercise form, and relevant anatomy.  PT answered pt's questions.   Person educated: Patient Education method: Explanation, demonstration, verbal cues Education comprehension: verbalized understanding and needs further education, verbal cues required, returned demonstration  HOME EXERCISE PROGRAM:   Access Code: [email or web address] URL: [email or web address] Date: 09/17/2024 Prepared by: Mose Minerva    ASSESSMENT:  CLINICAL IMPRESSION:  Worked on balance and NMC interventions today with good tolerance. Trialed stepping strategies with pt standing on compliant surface. Gait belt and CGA was utilized for this. She demonstrated unsteadiness, but CGA helped prevent any LOB. She imrpved with this as we continued with the activity.  Continued to provide cues for heel contact and toe off with gait which helps improve foot slap pattern. Pt due for reassessment next visit.      OBJECTIVE IMPAIRMENTS: Abnormal gait, decreased activity tolerance, decreased balance, decreased endurance, decreased mobility, difficulty walking, and decreased strength.   ACTIVITY LIMITATIONS: standing, squatting, stairs, transfers, and locomotion level  PARTICIPATION LIMITATIONS: cleaning and community activity  PERSONAL FACTORS: 3+ comorbidities: Arthritis, bilat TKA, peripheral neuropathy are also affecting patient's functional outcome.   REHAB POTENTIAL: Good  CLINICAL DECISION MAKING: Stable/uncomplicated  EVALUATION COMPLEXITY: Low   GOALS:   SHORT TERM GOALS: Target date:  06/05/2024   Pt will be independent and compliant with HEP for improved strength, balance, stability, and mobility.  Baseline: Goal status: GOAL MET 06/27/24  2.  Pt will demo improved time on TUG test by at least 5 seconds for improved mobility and reduced fall risk.   Baseline:  Goal status: ONGOING  12/15  3.  Pt will report at least a 25% improvement in her balance.   Baseline:  Goal status: GOAL MET  06/27/24  4.  Pt will report at least a 25% improvement in daily usage of R UE without adverse effects.  Goal Status:  GOAL MET  11/04/24 Target date: 10/09/2024  5.  Pt will demo at least a 12-15 deg increase in R shoulder flexion AROM and a 5-7 deg increase in R elbow extension AROM for improved performance of reaching and performance of daily activities.  Goal Status:  GOAL MET  11/04/24 Target date: 10/16/2024      LONG TERM GOALS: Target date:  12/30/2024   Pt will score at least 44/56 on the Berg Balance Assessment for improved balance and stability with daily tasks and mobility.  Baseline:  35/56 initial,  40/56 on 11/6 Goal status: NOT MET   2.  Pt will be able to load and unload her dishwasher with good stability and good confidence.   Baseline:  Goal status: GOAL MET  with holding onto counter  9/4  3.  Pt will demo improved bilat LE strength to 4+/5 in hip flexion, 5/5 in knee extension, 4-/5  in R ankle DF, and a 5# increase in L hip abduction for improved performance of functional mobility and performance of household chores.  Baseline:  Goal status:  ONGOING  4.  Pt will be able to ascend and descend stairs with a reciprocal gait with the rail.   Baseline:  Goal status: PROGRESSING  9/4  5.  Pt will report she is able to perform her normal ambulation with cane with good stability and confidence and without LOB. Baseline:  Goal status:ONGOING  6.  Pt will report she is able to perform her normal reaching activities including reaching into an overhead cabinet without significant difficulty and pain.   Goal status:  NOT MET  7.  Pt will be able to dress without difficulty.   Goal status:  NOT MET    8.  Pt will be able to perform her ADLs without significant shoulder pain and difficulty.   Goal status:  ONGOING  9.  Pt will report at least a 50% improvement in balance with turning her head and also closing heavy doors.   Goal status:  ONGOING   PLAN:  PT FREQUENCY: 2x/wk  PT DURATION: 6 weeks  PLANNED INTERVENTIONS: 02835- PT Re-evaluation, 97750- Physical Performance Testing, 97110-Therapeutic exercises, 97530- Therapeutic activity, [id number]- Neuromuscular re-education, 97535- Self Care, [id number]- Manual therapy, [phone]- Gait training, [phone]- Aquatic Therapy, [phone]- Electrical stimulation (unattended), Patient/Family education, Balance training, Stair training, Taping, DME instructions, Cryotherapy, and Moist heat  PLAN FOR NEXT SESSION: Work on [employer].  LE strengthening, gait, and balance activities.  Work on step ups and step downs.  Cont with shoulder ROM.    Asberry Rodes, PTA  12/26/24 4:04 PM                                                 "

## 2024-12-26 NOTE — Therapy (Deleted)
 "      OUTPATIENT PHYSICAL THERAPY TREATMENT           Patient Name: Hailey Perkins MRN: [id number] DOB:[birth year], 89 y.o., female Today's Date: 12/26/2024  END OF SESSION:             Past Medical History:  Diagnosis Date   Aortic atherosclerosis    Arthritis    Cataract 2010   bilateral eyes   Chicken pox    Cholelithiasis    Diverticulitis    Diverticulosis    GERD (gastroesophageal reflux disease)    Glaucoma    Hiatal hernia    History of frequent urinary tract infections    Hyperlipidemia    Osteopenia    Peripheral neuropathy    Sleep apnea    wears a CPAP   Urine incontinence    Past Surgical History:  Procedure Laterality Date   ABDOMINAL HYSTERECTOMY  1977   ANAL FISTULECTOMY  1985   ANKLE FRACTURE SURGERY Right 2014   APPENDECTOMY     BIOPSY  02/15/2021   Procedure: BIOPSY;  Surgeon: Wilhelmenia Aloha Raddle., MD;  Location: [facility];  Service: Gastroenterology;;   BREAST BIOPSY Bilateral 10+ yrs ago   BENIGN   BUNIONECTOMY Left 1975   Left Toe   CATARACT EXTRACTION, BILATERAL  2009   COLONOSCOPY WITH PROPOFOL  N/A 02/15/2021   Procedure: COLONOSCOPY WITH PROPOFOL ;  Surgeon: Wilhelmenia Aloha Raddle., MD;  Location: [facility];  Service: Gastroenterology;  Laterality: N/A;  ultraslimscope    DILATION AND CURETTAGE OF UTERUS  1972   eye PRK vision correction Left 10/09/2013   eye socket re-formed Left 2014   FOOT NEUROMA SURGERY Right 2003   plus bunionectomy, right great toe   HAMMER TOE SURGERY Right 2005   Right foot correction hammertoe little toe   HEMORRHOID SURGERY  1967   HIP SURGERY Right 2014   right hip femur pin implanted   KNEE ARTHROSCOPY Left 2010   debridement left knee scar tissue   LAPAROSCOPIC OOPHERECTOMY     1986, 1990   LASIK  1998   NEUROMA SURGERY Right 2003   Right Foot Neuroma, plus Bunionectomy, Right Great Toe   POLYPECTOMY  02/15/2021   Procedure: POLYPECTOMY;  Surgeon: Wilhelmenia Aloha Raddle., MD;  Location: [facility];  Service: Gastroenterology;;   RECTOCELE REPAIR  1968   REPLACEMENT TOTAL KNEE Left 01/2008   REPLACEMENT TOTAL KNEE Right 08/2008   retinal pucker correction Left 07/14/2012   schwannoma tumor (benign) removal  2011   right rib   SIGMOIDOSCOPY     TONSILLECTOMY AND ADENOIDECTOMY     VENTRAL HERNIA REPAIR  1993   Patient Active Problem List   Diagnosis Date Noted   Precordial chest pain 10/14/2024   Arthralgia of right elbow 08/24/2022   Bilateral wrist pain 08/24/2022   Neck pain 08/24/2022   Pain of right hip joint 08/24/2022   Chronic pain of right knee 05/27/2021   OSA on CPAP 05/27/2021   LLQ pain 12/16/2020   History of diverticulitis 12/16/2020   Hematochezia 12/16/2020   Abnormal colonoscopy 12/16/2020   Acquired trigger finger of right index finger 10/26/2020   Other specified arthritis, right hand 10/26/2020   Diverticulitis of colon 08/15/2020   Dry eyes, bilateral 10/10/2019   Sleep-related hypoxia 08/05/2019   OSA (obstructive sleep apnea) 08/05/2019   PLMD (periodic limb movement disorder) 08/05/2019   Cerebellar 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) 05/06/2019   Excessive daytime sleepiness 05/06/2019   Loud  snoring 05/06/2019   Ventral hernia without obstruction or gangrene 02/22/2019   Chronic throat clearing 01/18/2019   Nocturnal cough 01/18/2019   Family history of colon cancer in father 01/18/2019   Diverticulosis 01/18/2019   Gait abnormality 12/27/2018   Numbness 12/27/2018   Status post total bilateral knee replacement 11/30/2018   DDD (degenerative disc disease), cervical 11/30/2018   Status post carpal tunnel release 11/30/2018   DDD (degenerative disc disease), lumbar 10/26/2018   Primary osteoarthritis of both hands 10/26/2018   Primary osteoarthritis of both feet 10/26/2018   Gastroesophageal reflux disease 08/27/2018   Bilateral impacted cerumen 08/21/2018   Sensorineural hearing loss (SNHL) of  both ears 08/21/2018   Tinnitus, bilateral 08/21/2018   Blood in urine 04/17/2018   Secondary open-angle glaucoma of left eye, moderate stage 03/10/2018   Early stage 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 10/05/2017   Epiretinal membrane (ERM) of left eye 10/05/2017   Pseudophakia of both eyes 10/05/2017   Osteopenia 03/19/2017   Mixed hyperlipidemia 08/18/2016   Generalized osteoarthritis of multiple sites 08/18/2016   Peripheral neuropathic pain 08/18/2016   Overactive bladder 08/18/2016   Glaucoma 08/18/2016   Cervical disc disorder with radiculopathy of cervical region 08/17/2016   Carpal tunnel syndrome, right upper limb 08/17/2016   Abnormal finding on mammography 06/14/2016   Breast lump 05/06/2015    PCP: Yolande Toribio MATSU, MD  REFERRING PROVIDER:  Yolande Toribio MATSU, MD   Delane Lye, MD   REFERRING DIAG:  R26.81 (ICD-10-CM) - Unsteadiness on feet/Unsteady gait   M25.511 (ICD-10-CM) - Pain in right shoulder   THERAPY DIAG:  No diagnosis found.  Rationale for Evaluation and Treatment: Rehabilitation  ONSET DATE: 2021 ; PT order 04/04/2024  SUBJECTIVE:   SUBJECTIVE STATEMENT: Pt had sleep study last night, which went well. Took prednisone last week for a sore throat and thinks this helped with her R shoulder pain. No pain at rest. Pt had a fall on Thursday last week in her garage when she tripped over a runner getting out of her car. She states she was able to get to a shelf and use it to help her stand up. Landed on R knee but denies injury of increase in pain. Removed runner from the area.     PERTINENT HISTORY: Pt uses a SPC with walking.  Flexor tenotomy on 3 toes of R foot on 05/01/24--Pt reports she has no restrictions from MD. Arthritis, osteopenia, peripheral neuropathy Bilat TKR  PAIN:  R shoulder:  0/10    PRECAUTIONS: Fall and Other: peripheral neuropathy   WEIGHT BEARING RESTRICTIONS: No  FALLS:  Has patient fallen  in last 6 months? Yes. Number of falls 1 (12/05/24)  LIVING ENVIRONMENT: Lives with: lives alone Lives in:  1 story home Stairs: No Has following equipment at home: Single point cane, shower chair, and Grab bars  OCCUPATION:  Retired  PLOF: Independent  PATIENT GOALS: improved ability to pivot.  Improved walking on uneven terrain and performing stairs   OBJECTIVE:  Note: Objective measures were completed at Evaluation unless otherwise noted.  DIAGNOSTIC FINDINGS: Pt reports x rays were negative for fracture.   TREATMENT DATE:     12/11/24 -Pulleys flexion x , scaption x65min -Standing finger ladder walks flexion x10reps -STS 2x10 -seated toe raises 2x20 -seated marches 2x10 -standing HR 2x10 -hurdle step overs fwd and lateral x4hurdles x3 laps each with single UE support -SLS trials with fingertip support 10sec x5ea -Wlaking marching along rial with light UE  support x4 laps -Sidestepping on airex beam x5laps -Gait in hall with [facility], cues for increased step length x176ft     12/04/24  Pulleys in flexion and scaption x 20 each Standing ladder walks in flexion x 10 reps Shelf reach x 10, 1# x10 reps Sidestepping on airex beam with UE support with SBA Tandem gait on airex beam with UE support with SBA Reciprocal stepping over 3 hurdles and onto airex with UE support on rail Standing DF one LE at a time 2x10 each LE Pt ascended and descended 5 steps with 1 rail with a step to and step through gait and SBA  Seated wand ER approx 10 reps  Reviewed shoulder HEP.                                                                  PATIENT EDUCATION:  Education details:  HEP, rationale of interventions, POC, dx, exercise form, and relevant anatomy.  PT answered pt's questions.   Person educated: Patient Education method: Explanation, demonstration, verbal cues Education comprehension: verbalized understanding and needs further education, verbal cues required, returned  demonstration  HOME EXERCISE PROGRAM:   Access Code: [email or web address] URL: [email or web address] Date: 09/17/2024 Prepared by: Mose Minerva    ASSESSMENT:  CLINICAL IMPRESSION:  No complaints of increased pain with shoulder focused exercises. She is very challenged by standing heel raises. Discussed continuing to perform these in water when she resumes her pool classes. With gait, she continues to demonstrates shortened step length, decreased heel contact, and decreased toe off. Worked on SLS trials to improve single leg stability which is likely affecting her gait quality. Pt tends to become unsteady with turning movement or head movements. She does note suffering from vertigo. Discussed PT intervention for this and to talk to PCP if this may help her.      OBJECTIVE IMPAIRMENTS: Abnormal gait, decreased activity tolerance, decreased balance, decreased endurance, decreased mobility, difficulty walking, and decreased strength.   ACTIVITY LIMITATIONS: standing, squatting, stairs, transfers, and locomotion level  PARTICIPATION LIMITATIONS: cleaning and community activity  PERSONAL FACTORS: 3+ comorbidities: Arthritis, bilat TKA, peripheral neuropathy are also affecting patient's functional outcome.   REHAB POTENTIAL: Good  CLINICAL DECISION MAKING: Stable/uncomplicated  EVALUATION COMPLEXITY: Low   GOALS:   SHORT TERM GOALS: Target date:  06/05/2024   Pt will be independent and compliant with HEP for improved strength, balance, stability, and mobility.  Baseline: Goal status: GOAL MET 06/27/24  2.  Pt will demo improved time on TUG test by at least 5 seconds for improved mobility and reduced fall risk.   Baseline:  Goal status: ONGOING  12/15  3.  Pt will report at least a 25% improvement in her balance.   Baseline:  Goal status: GOAL MET  06/27/24  4.  Pt will report at least a 25% improvement in daily usage of R UE without adverse effects.  Goal Status:  GOAL  MET  11/04/24 Target date: 10/09/2024  5.  Pt will demo at least a 12-15 deg increase in R shoulder flexion AROM and a 5-7 deg increase in R elbow extension AROM for improved performance of reaching and performance of daily activities.  Goal Status:  GOAL MET  11/04/24 Target date: 10/16/2024      LONG  TERM GOALS: Target date:  12/30/2024   Pt will score at least 44/56 on the Berg Balance Assessment for improved balance and stability with daily tasks and mobility.  Baseline:  35/56 initial,  40/56 on 11/6 Goal status: NOT MET   2.  Pt will be able to load and unload her dishwasher with good stability and good confidence.  Baseline:  Goal status: GOAL MET  with holding onto counter  9/4  3.  Pt will demo improved bilat LE strength to 4+/5 in hip flexion, 5/5 in knee extension, 4-/5 in R ankle DF, and a 5# increase in L hip abduction for improved performance of functional mobility and performance of household chores.  Baseline:  Goal status:  ONGOING  4.  Pt will be able to ascend and descend stairs with a reciprocal gait with the rail.   Baseline:  Goal status: PROGRESSING  9/4  5.  Pt will report she is able to perform her normal ambulation with cane with good stability and confidence and without LOB. Baseline:  Goal status:ONGOING  6.  Pt will report she is able to perform her normal reaching activities including reaching into an overhead cabinet without significant difficulty and pain.   Goal status:  NOT MET  7.  Pt will be able to dress without difficulty.   Goal status:  NOT MET    8.  Pt will be able to perform her ADLs without significant shoulder pain and difficulty.   Goal status:  ONGOING  9.  Pt will report at least a 50% improvement in balance with turning her head and also closing heavy doors.   Goal status:  ONGOING   PLAN:  PT FREQUENCY: 2x/wk  PT DURATION: 6 weeks  PLANNED INTERVENTIONS: 02835- PT Re-evaluation, 97750- Physical Performance Testing,  97110-Therapeutic exercises, 97530- Therapeutic activity, [id number]- Neuromuscular re-education, 97535- Self Care, 02859- Manual therapy, [phone]- Gait training, [phone]- Aquatic Therapy, [phone]- Electrical stimulation (unattended), Patient/Family education, Balance training, Stair training, Taping, DME instructions, Cryotherapy, and Moist heat  PLAN FOR NEXT SESSION: Work on [employer].  LE strengthening, gait, and balance activities.  Work on step ups and step downs.  Cont with shoulder ROM.    Asberry Rodes, PTA  12/26/24 2:13 PM                                                      OUTPATIENT PHYSICAL THERAPY TREATMENT           Patient Name: Hailey Perkins MRN: [id number] DOB:12-Feb-1935, 89 y.o., female Today's Date: 12/26/2024  END OF SESSION:             Past Medical History:  Diagnosis Date   Aortic atherosclerosis    Arthritis    Cataract 2010   bilateral eyes   Chicken pox    Cholelithiasis    Diverticulitis    Diverticulosis    GERD (gastroesophageal reflux disease)    Glaucoma    Hiatal hernia    History of frequent urinary tract infections    Hyperlipidemia    Osteopenia    Peripheral neuropathy    Sleep apnea    wears a CPAP   Urine incontinence    Past Surgical History:  Procedure Laterality Date   ABDOMINAL HYSTERECTOMY  1977   ANAL FISTULECTOMY  1985   ANKLE FRACTURE SURGERY  Right 2014   APPENDECTOMY     BIOPSY  02/15/2021   Procedure: BIOPSY;  Surgeon: Wilhelmenia Aloha Raddle., MD;  Location: [facility];  Service: Gastroenterology;;   BREAST BIOPSY Bilateral 10+ yrs ago   BENIGN   BUNIONECTOMY Left 1975   Left Toe   CATARACT EXTRACTION, BILATERAL  2009   COLONOSCOPY WITH PROPOFOL  N/A 02/15/2021   Procedure: COLONOSCOPY WITH PROPOFOL ;  Surgeon: Wilhelmenia Aloha Raddle., MD;  Location: [facility];  Service: Gastroenterology;  Laterality: N/A;  ultraslimscope    DILATION AND CURETTAGE OF UTERUS   1972   eye PRK vision correction Left 10/09/2013   eye socket re-formed Left 2014   FOOT NEUROMA SURGERY Right 2003   plus bunionectomy, right great toe   HAMMER TOE SURGERY Right 2005   Right foot correction hammertoe little toe   HEMORRHOID SURGERY  1967   HIP SURGERY Right 2014   right hip femur pin implanted   KNEE ARTHROSCOPY Left 2010   debridement left knee scar tissue   LAPAROSCOPIC OOPHERECTOMY     1986, 1990   LASIK  1998   NEUROMA SURGERY Right 2003   Right Foot Neuroma, plus Bunionectomy, Right Great Toe   POLYPECTOMY  02/15/2021   Procedure: POLYPECTOMY;  Surgeon: Wilhelmenia Aloha Raddle., MD;  Location: [facility];  Service: Gastroenterology;;   RECTOCELE REPAIR  1968   REPLACEMENT TOTAL KNEE Left 01/2008   REPLACEMENT TOTAL KNEE Right 08/2008   retinal pucker correction Left 07/14/2012   schwannoma tumor (benign) removal  2011   right rib   SIGMOIDOSCOPY     TONSILLECTOMY AND ADENOIDECTOMY     VENTRAL HERNIA REPAIR  1993   Patient Active Problem List   Diagnosis Date Noted   Precordial chest pain 10/14/2024   Arthralgia of right elbow 08/24/2022   Bilateral wrist pain 08/24/2022   Neck pain 08/24/2022   Pain of right hip joint 08/24/2022   Chronic pain of right knee 05/27/2021   OSA on CPAP 05/27/2021   LLQ pain 12/16/2020   History of diverticulitis 12/16/2020   Hematochezia 12/16/2020   Abnormal colonoscopy 12/16/2020   Acquired trigger finger of right index finger 10/26/2020   Other specified arthritis, right hand 10/26/2020   Diverticulitis of colon 08/15/2020   Dry eyes, bilateral 10/10/2019   Sleep-related hypoxia 08/05/2019   OSA (obstructive sleep apnea) 08/05/2019   PLMD (periodic limb movement disorder) 08/05/2019   Cerebellar 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) 05/06/2019   Excessive daytime sleepiness 05/06/2019   Loud snoring 05/06/2019   Ventral hernia without obstruction or gangrene 02/22/2019   Chronic throat clearing  01/18/2019   Nocturnal cough 01/18/2019   Family history of colon cancer in father 01/18/2019   Diverticulosis 01/18/2019   Gait abnormality 12/27/2018   Numbness 12/27/2018   Status post total bilateral knee replacement 11/30/2018   DDD (degenerative disc disease), cervical 11/30/2018   Status post carpal tunnel release 11/30/2018   DDD (degenerative disc disease), lumbar 10/26/2018   Primary osteoarthritis of both hands 10/26/2018   Primary osteoarthritis of both feet 10/26/2018   Gastroesophageal reflux disease 08/27/2018   Bilateral impacted cerumen 08/21/2018   Sensorineural hearing loss (SNHL) of both ears 08/21/2018   Tinnitus, bilateral 08/21/2018   Blood in urine 04/17/2018   Secondary open-angle glaucoma of left eye, moderate stage 03/10/2018   Early stage 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 10/05/2017   Epiretinal membrane (ERM) of left eye 10/05/2017   Pseudophakia of both eyes 10/05/2017  Osteopenia 03/19/2017   Mixed hyperlipidemia 08/18/2016   Generalized osteoarthritis of multiple sites 08/18/2016   Peripheral neuropathic pain 08/18/2016   Overactive bladder 08/18/2016   Glaucoma 08/18/2016   Cervical disc disorder with radiculopathy of cervical region 08/17/2016   Carpal tunnel syndrome, right upper limb 08/17/2016   Abnormal finding on mammography 06/14/2016   Breast lump 05/06/2015    PCP: Yolande Toribio MATSU, MD  REFERRING PROVIDER:  Yolande Toribio MATSU, MD   Delane Lye, MD   REFERRING DIAG:  R26.81 (ICD-10-CM) - Unsteadiness on feet/Unsteady gait   M25.511 (ICD-10-CM) - Pain in right shoulder   THERAPY DIAG:  No diagnosis found.  Rationale for Evaluation and Treatment: Rehabilitation  ONSET DATE: 2021 ; PT order 04/04/2024  SUBJECTIVE:   SUBJECTIVE STATEMENT: Pt reports MD told her PT would help with her shoulder arthritis (xrays came back). She states she had a near fall recently but was able to maintain her balance.      PERTINENT HISTORY: Pt uses a SPC with walking.  Flexor tenotomy on 3 toes of R foot on 05/01/24--Pt reports she has no restrictions from MD. Arthritis, osteopenia, peripheral neuropathy Bilat TKR  PAIN:  R shoulder:  0/10    PRECAUTIONS: Fall and Other: peripheral neuropathy   WEIGHT BEARING RESTRICTIONS: No  FALLS:  Has patient fallen in last 6 months? Yes. Number of falls 1 (12/05/24)  LIVING ENVIRONMENT: Lives with: lives alone Lives in:  1 story home Stairs: No Has following equipment at home: Single point cane, shower chair, and Grab bars  OCCUPATION:  Retired  PLOF: Independent  PATIENT GOALS: improved ability to pivot.  Improved walking on uneven terrain and performing stairs   OBJECTIVE:  Note: Objective measures were completed at Evaluation unless otherwise noted.  DIAGNOSTIC FINDINGS: Pt reports x rays were negative for fracture.   TREATMENT DATE:     12/24/24 -[place] ladder flexion x10 -standing HR 2x10 -walking marches with rail support x2 laps -side stepping along rail x2 laps -retro walking along rail x2 laps (cues for longer steps) -marching on airex 2x10 -Fwd step ups on airex 2x10 -squats at rail x10 -HSC machine 25# 2x10 -leg extension machine 10# 2x10   12/19/24 Pulleys x7min flexion, x46min scaption -[place] ladder flexion x10 -standing HR 2x10 -walking marches with rail support x2 laps -Exaggerated heel contact and toe off gait with rail x2 laps -side stepping along rail x2 laps -retro walking along rail x2 laps (cues for longer steps) -squats at rail x10 -HSC machine 25# 2x10 -leg extension machine 10# 2x10     12/11/24 -Pulleys flexion x , scaption x63min -Standing finger ladder walks flexion x10reps -STS 2x10 -seated toe raises 2x20 -seated marches 2x10 -standing HR 2x10 -hurdle step overs fwd and lateral x4hurdles x3 laps each with single UE support -SLS trials with fingertip support 10sec x5ea -Wlaking marching along  rial with light UE support x4 laps -Sidestepping on airex beam x5laps -Gait in hall with [facility], cues for increased step length x144ft     12/04/24  Pulleys in flexion and scaption x 20 each Standing ladder walks in flexion x 10 reps Shelf reach x 10, 1# x10 reps Sidestepping on airex beam with UE support with SBA Tandem gait on airex beam with UE support with SBA Reciprocal stepping over 3 hurdles and onto airex with UE support on rail Standing DF one LE at a time 2x10 each LE Pt ascended and descended 5 steps with 1 rail with a step to and  step through gait and SBA  Seated wand ER approx 10 reps  Reviewed shoulder HEP.                                                                  PATIENT EDUCATION:  Education details:  HEP, rationale of interventions, POC, dx, exercise form, and relevant anatomy.  PT answered pt's questions.   Person educated: Patient Education method: Explanation, demonstration, verbal cues Education comprehension: verbalized understanding and needs further education, verbal cues required, returned demonstration  HOME EXERCISE PROGRAM:   Access Code: [email or web address] URL: [email or web address] Date: 09/17/2024 Prepared by: Mose Minerva    ASSESSMENT:  CLINICAL IMPRESSION:  Pt able to continue with gym machine strengthening with good tolerance. Progressed balance challenges today with good tolerance. She does require SBA with balance tasks as a precaution. She again required cuing for increased step length with retro walking along railing. Will continue to progress as tolerated.      OBJECTIVE IMPAIRMENTS: Abnormal gait, decreased activity tolerance, decreased balance, decreased endurance, decreased mobility, difficulty walking, and decreased strength.   ACTIVITY LIMITATIONS: standing, squatting, stairs, transfers, and locomotion level  PARTICIPATION LIMITATIONS: cleaning and community activity  PERSONAL FACTORS: 3+ comorbidities:  Arthritis, bilat TKA, peripheral neuropathy are also affecting patient's functional outcome.   REHAB POTENTIAL: Good  CLINICAL DECISION MAKING: Stable/uncomplicated  EVALUATION COMPLEXITY: Low   GOALS:   SHORT TERM GOALS: Target date:  06/05/2024   Pt will be independent and compliant with HEP for improved strength, balance, stability, and mobility.  Baseline: Goal status: GOAL MET 06/27/24  2.  Pt will demo improved time on TUG test by at least 5 seconds for improved mobility and reduced fall risk.   Baseline:  Goal status: ONGOING  12/15  3.  Pt will report at least a 25% improvement in her balance.   Baseline:  Goal status: GOAL MET  06/27/24  4.  Pt will report at least a 25% improvement in daily usage of R UE without adverse effects.  Goal Status:  GOAL MET  11/04/24 Target date: 10/09/2024  5.  Pt will demo at least a 12-15 deg increase in R shoulder flexion AROM and a 5-7 deg increase in R elbow extension AROM for improved performance of reaching and performance of daily activities.  Goal Status:  GOAL MET  11/04/24 Target date: 10/16/2024      LONG TERM GOALS: Target date:  12/30/2024   Pt will score at least 44/56 on the Berg Balance Assessment for improved balance and stability with daily tasks and mobility.  Baseline:  35/56 initial,  40/56 on 11/6 Goal status: NOT MET   2.  Pt will be able to load and unload her dishwasher with good stability and good confidence.  Baseline:  Goal status: GOAL MET  with holding onto counter  9/4  3.  Pt will demo improved bilat LE strength to 4+/5 in hip flexion, 5/5 in knee extension, 4-/5 in R ankle DF, and a 5# increase in L hip abduction for improved performance of functional mobility and performance of household chores.  Baseline:  Goal status:  ONGOING  4.  Pt will be able to ascend and descend stairs with a reciprocal gait with the rail.   Baseline:  Goal status: PROGRESSING  9/4  5.  Pt will report she is able to  perform her normal ambulation with cane with good stability and confidence and without LOB. Baseline:  Goal status:ONGOING  6.  Pt will report she is able to perform her normal reaching activities including reaching into an overhead cabinet without significant difficulty and pain.   Goal status:  NOT MET  7.  Pt will be able to dress without difficulty.   Goal status:  NOT MET    8.  Pt will be able to perform her ADLs without significant shoulder pain and difficulty.   Goal status:  ONGOING  9.  Pt will report at least a 50% improvement in balance with turning her head and also closing heavy doors.   Goal status:  ONGOING   PLAN:  PT FREQUENCY: 2x/wk  PT DURATION: 6 weeks  PLANNED INTERVENTIONS: 02835- PT Re-evaluation, 97750- Physical Performance Testing, 97110-Therapeutic exercises, 97530- Therapeutic activity, [id number]- Neuromuscular re-education, 97535- Self Care, 02859- Manual therapy, [phone]- Gait training, [phone]- Aquatic Therapy, [phone]- Electrical stimulation (unattended), Patient/Family education, Balance training, Stair training, Taping, DME instructions, Cryotherapy, and Moist heat  PLAN FOR NEXT SESSION: Work on [employer].  LE strengthening, gait, and balance activities.  Work on step ups and step downs.  Cont with shoulder ROM.    Asberry Rodes, PTA  12/26/24 2:13 PM                                           "

## 2024-12-27 NOTE — Telephone Encounter (Signed)
 Hailey Perkins

## 2024-12-30 ENCOUNTER — Ambulatory Visit (HOSPITAL_BASED_OUTPATIENT_CLINIC_OR_DEPARTMENT_OTHER): Admitting: Physical Therapy

## 2024-12-30 NOTE — Telephone Encounter (Signed)
 Amy can prescribe the bella swift nasal mask and headgear for this patient, as she requested.   Can Hailey Perkins please contact the DME for the textile covered chin straps- she will likely learn that she has to order it herself -if the DME doesn't carry it.  I don't know a brand name for that and I don't expect it to be covered by insurance.   CD

## 2024-12-31 NOTE — Addendum Note (Signed)
 Addended byBETHA FORBES, Barett Whidbee L on: 12/31/2024 01:15 PM   Modules accepted: Orders

## 2025-01-01 ENCOUNTER — Encounter (HOSPITAL_BASED_OUTPATIENT_CLINIC_OR_DEPARTMENT_OTHER): Admitting: Physical Therapy

## 2025-01-01 NOTE — Telephone Encounter (Signed)
 Hailey Perkins

## 2025-01-01 NOTE — Telephone Encounter (Signed)
 Order sent to dme

## 2025-01-02 ENCOUNTER — Ambulatory Visit: Admitting: Podiatry

## 2025-01-06 ENCOUNTER — Ambulatory Visit: Admitting: Podiatry

## 2025-01-07 ENCOUNTER — Ambulatory Visit (HOSPITAL_BASED_OUTPATIENT_CLINIC_OR_DEPARTMENT_OTHER): Attending: Internal Medicine | Admitting: Physical Therapy

## 2025-01-08 NOTE — Telephone Encounter (Signed)
 Left message for patient to return my call to discuss sleep study results.

## 2025-01-08 NOTE — Telephone Encounter (Signed)
-----   Message from Dedra Gores, MD sent at 12/23/2024 12:46 PM EST ----- The new auto-titration device will be set at 6 through 13 cm water , 2 cm EPR and heated humidification, optional chin strap.

## 2025-01-09 NOTE — Telephone Encounter (Signed)
 SABRA

## 2025-01-09 NOTE — Telephone Encounter (Signed)
 Spoke to patient and informed her of sleep study results.   I called Hailey Perkins. I advised Hailey Perkins that Dr. Chalice reviewed their sleep study results and found that Hailey Perkins has sleep apnea. Dr. Chalice recommends that Hailey Perkins start cpap therapy. I reviewed PAP compliance expectations with the Hailey Perkins. Hailey Perkins is agreeable to starting a CPAP. I advised Hailey Perkins that an order will be sent to a DME, [facility], and [facility] will call the Hailey Perkins within about one week after they file with the Hailey Perkins's insurance. [employer] will show the Hailey Perkins how to use the machine, fit for masks, and troubleshoot the CPAP if needed. A follow up appt was made for insurance purposes with Amy Lomax on 03/24/2025. Hailey Perkins verbalized understanding to arrive 15 minutes early and bring their CPAP. Hailey Perkins verbalized understanding of results. Hailey Perkins had no questions at this time but was encouraged to call back if questions arise. I have sent the order to [facility] and have received confirmation that they have received the order.

## 2025-01-10 ENCOUNTER — Encounter: Payer: Self-pay | Admitting: Family Medicine

## 2025-01-20 ENCOUNTER — Ambulatory Visit: Admitting: Podiatry

## 2025-03-24 ENCOUNTER — Ambulatory Visit: Admitting: Family Medicine
